# Patient Record
Sex: Male | Born: 1935 | Race: White | Hispanic: No | Smoking: Former smoker
Health system: Southern US, Community
[De-identification: ages and names within clinical notes are randomized; demographics above are authoritative.]

## PROBLEM LIST (undated history)

## (undated) DIAGNOSIS — R0600 Dyspnea, unspecified: Secondary | ICD-10-CM

## (undated) DIAGNOSIS — I10 Essential (primary) hypertension: Secondary | ICD-10-CM

## (undated) DIAGNOSIS — I4891 Unspecified atrial fibrillation: Secondary | ICD-10-CM

## (undated) DIAGNOSIS — G7000 Myasthenia gravis without (acute) exacerbation: Secondary | ICD-10-CM

## (undated) DIAGNOSIS — M1990 Unspecified osteoarthritis, unspecified site: Secondary | ICD-10-CM

## (undated) DIAGNOSIS — Z9889 Other specified postprocedural states: Secondary | ICD-10-CM

## (undated) DIAGNOSIS — J189 Pneumonia, unspecified organism: Secondary | ICD-10-CM

## (undated) HISTORY — DX: Unspecified atrial fibrillation: I48.91

## (undated) HISTORY — PX: APPENDECTOMY: SHX54

## (undated) HISTORY — PX: CHOLECYSTECTOMY: SHX55

## (undated) HISTORY — PX: NOSE SURGERY: SHX723

## (undated) HISTORY — DX: Essential (primary) hypertension: I10

## (undated) HISTORY — PX: MITRAL VALVE REPAIR: SHX2039

## (undated) HISTORY — PX: BACK SURGERY: SHX140

## (undated) HISTORY — PX: FINGER SURGERY: SHX640

## (undated) HISTORY — DX: Other specified postprocedural states: Z98.890

## (undated) HISTORY — DX: Myasthenia gravis without (acute) exacerbation: G70.00

---

## 2003-07-09 DIAGNOSIS — Z9889 Other specified postprocedural states: Secondary | ICD-10-CM

## 2003-07-09 HISTORY — DX: Other specified postprocedural states: Z98.890

## 2009-03-09 DIAGNOSIS — E7800 Pure hypercholesterolemia, unspecified: Secondary | ICD-10-CM | POA: Diagnosis present

## 2009-03-09 DIAGNOSIS — I10 Essential (primary) hypertension: Secondary | ICD-10-CM | POA: Diagnosis present

## 2012-03-28 DIAGNOSIS — I4819 Other persistent atrial fibrillation: Secondary | ICD-10-CM | POA: Diagnosis present

## 2012-03-28 DIAGNOSIS — I4811 Longstanding persistent atrial fibrillation: Secondary | ICD-10-CM | POA: Diagnosis present

## 2018-11-23 DIAGNOSIS — E663 Overweight: Secondary | ICD-10-CM | POA: Insufficient documentation

## 2021-07-05 ENCOUNTER — Other Ambulatory Visit: Payer: Self-pay

## 2021-07-05 ENCOUNTER — Emergency Department (HOSPITAL_COMMUNITY): Payer: Medicare HMO

## 2021-07-05 ENCOUNTER — Emergency Department (HOSPITAL_COMMUNITY)
Admission: EM | Admit: 2021-07-05 | Discharge: 2021-07-05 | Disposition: A | Discharge status: Home / Self Care | Payer: Medicare HMO | Attending: Emergency Medicine | Admitting: Emergency Medicine

## 2021-07-05 DIAGNOSIS — S92351A Displaced fracture of fifth metatarsal bone, right foot, initial encounter for closed fracture: Secondary | ICD-10-CM | POA: Diagnosis not present

## 2021-07-05 DIAGNOSIS — S62632A Displaced fracture of distal phalanx of right middle finger, initial encounter for closed fracture: Secondary | ICD-10-CM | POA: Insufficient documentation

## 2021-07-05 DIAGNOSIS — Z20822 Contact with and (suspected) exposure to covid-19: Secondary | ICD-10-CM | POA: Insufficient documentation

## 2021-07-05 DIAGNOSIS — N200 Calculus of kidney: Secondary | ICD-10-CM | POA: Diagnosis not present

## 2021-07-05 DIAGNOSIS — M79671 Pain in right foot: Secondary | ICD-10-CM | POA: Insufficient documentation

## 2021-07-05 DIAGNOSIS — S99921A Unspecified injury of right foot, initial encounter: Secondary | ICD-10-CM | POA: Diagnosis present

## 2021-07-05 DIAGNOSIS — S51011A Laceration without foreign body of right elbow, initial encounter: Secondary | ICD-10-CM | POA: Diagnosis not present

## 2021-07-05 DIAGNOSIS — S0990XA Unspecified injury of head, initial encounter: Secondary | ICD-10-CM | POA: Diagnosis not present

## 2021-07-05 DIAGNOSIS — S0093XA Contusion of unspecified part of head, initial encounter: Secondary | ICD-10-CM | POA: Diagnosis not present

## 2021-07-05 DIAGNOSIS — W108XXA Fall (on) (from) other stairs and steps, initial encounter: Secondary | ICD-10-CM | POA: Insufficient documentation

## 2021-07-05 DIAGNOSIS — T1490XA Injury, unspecified, initial encounter: Secondary | ICD-10-CM

## 2021-07-05 DIAGNOSIS — S2231XA Fracture of one rib, right side, initial encounter for closed fracture: Secondary | ICD-10-CM | POA: Insufficient documentation

## 2021-07-05 DIAGNOSIS — Z7901 Long term (current) use of anticoagulants: Secondary | ICD-10-CM | POA: Insufficient documentation

## 2021-07-05 DIAGNOSIS — S51019A Laceration without foreign body of unspecified elbow, initial encounter: Secondary | ICD-10-CM

## 2021-07-05 DIAGNOSIS — N2889 Other specified disorders of kidney and ureter: Secondary | ICD-10-CM

## 2021-07-05 LAB — COMPREHENSIVE METABOLIC PANEL
ALT: 49 U/L — ABNORMAL HIGH (ref 0–44)
AST: 49 U/L — ABNORMAL HIGH (ref 15–41)
Albumin: 3.5 g/dL (ref 3.5–5.0)
Alkaline Phosphatase: 45 U/L (ref 38–126)
Anion gap: 9 (ref 5–15)
BUN: 22 mg/dL (ref 8–23)
CO2: 26 mmol/L (ref 22–32)
Calcium: 10.1 mg/dL (ref 8.9–10.3)
Chloride: 103 mmol/L (ref 98–111)
Creatinine, Ser: 1.15 mg/dL (ref 0.61–1.24)
GFR, Estimated: >60 mL/min (ref >60)
Glucose, Bld: 152 mg/dL — ABNORMAL HIGH (ref 70–99)
Potassium: 3.9 mmol/L (ref 3.5–5.1)
Sodium: 138 mmol/L (ref 135–145)
Total Bilirubin: 0.7 mg/dL (ref 0.3–1.2)
Total Protein: 6.4 g/dL — ABNORMAL LOW (ref 6.5–8.1)

## 2021-07-05 LAB — RESP PANEL BY RT-PCR (FLU A&B, COVID) ARPGX2
Influenza A by PCR: NEGATIVE
Influenza B by PCR: NEGATIVE
SARS Coronavirus 2 by RT PCR: NEGATIVE

## 2021-07-05 LAB — CBC
HCT: 37.4 % — ABNORMAL LOW (ref 39.0–52.0)
Hemoglobin: 12.2 g/dL — ABNORMAL LOW (ref 13.0–17.0)
MCH: 32.6 pg (ref 26.0–34.0)
MCHC: 32.6 g/dL (ref 30.0–36.0)
MCV: 100 fL (ref 80.0–100.0)
Platelets: 153 10*3/uL (ref 150–400)
RBC: 3.74 MIL/uL — ABNORMAL LOW (ref 4.22–5.81)
RDW: 12.9 % (ref 11.5–15.5)
WBC: 8.1 10*3/uL (ref 4.0–10.5)
nRBC: 0 % (ref 0.0–0.2)

## 2021-07-05 LAB — I-STAT CHEM 8, ED
BUN: 25 mg/dL — ABNORMAL HIGH (ref 8–23)
Calcium, Ion: 1.28 mmol/L (ref 1.15–1.40)
Chloride: 100 mmol/L (ref 98–111)
Creatinine, Ser: 1 mg/dL (ref 0.61–1.24)
Glucose, Bld: 145 mg/dL — ABNORMAL HIGH (ref 70–99)
HCT: 37 % — ABNORMAL LOW (ref 39.0–52.0)
Hemoglobin: 12.6 g/dL — ABNORMAL LOW (ref 13.0–17.0)
Potassium: 3.8 mmol/L (ref 3.5–5.1)
Sodium: 138 mmol/L (ref 135–145)
TCO2: 28 mmol/L (ref 22–32)

## 2021-07-05 LAB — URINALYSIS, ROUTINE W REFLEX MICROSCOPIC
Bacteria, UA: NONE SEEN
Bilirubin Urine: NEGATIVE
Glucose, UA: NEGATIVE mg/dL
Ketones, ur: NEGATIVE mg/dL
Leukocytes,Ua: NEGATIVE
Nitrite: NEGATIVE
Protein, ur: 30 mg/dL — AB
Specific Gravity, Urine: 1.02 (ref 1.005–1.030)
pH: 5 (ref 5.0–8.0)

## 2021-07-05 LAB — LACTIC ACID, PLASMA: Lactic Acid, Venous: 2.5 mmol/L (ref 0.5–1.9)

## 2021-07-05 LAB — PROTIME-INR
INR: 2 — ABNORMAL HIGH (ref 0.8–1.2)
Prothrombin Time: 22.2 seconds — ABNORMAL HIGH (ref 11.4–15.2)

## 2021-07-05 LAB — SAMPLE TO BLOOD BANK

## 2021-07-05 MED ORDER — HYDROCODONE-ACETAMINOPHEN 5-325 MG PO TABS
1.0000 | ORAL_TABLET | Freq: Once | ORAL | Status: AC
  Administered 2021-07-05: 20:00:00 1 via ORAL
  Filled 2021-07-05: qty 1

## 2021-07-05 MED ORDER — IOHEXOL 300 MG/ML  SOLN
100.0000 mL | Freq: Once | INTRAMUSCULAR | Status: AC | PRN
  Administered 2021-07-05: 16:00:00 100 mL via INTRAVENOUS

## 2021-07-05 MED ORDER — HYDROCODONE-ACETAMINOPHEN 5-325 MG PO TABS: 1.0000 | ORAL_TABLET | ORAL | 0 refills | Status: DC | PRN

## 2021-07-05 NOTE — Discharge Instructions (Addendum)
Schedule follow-up with orthopedics to follow the fractures in the foot.  Schedule follow-up with your primary care doctor to reevaluate the mass on your kidney.

## 2021-07-05 NOTE — Progress Notes (Signed)
This chaplain responded to Level 2  fall on thinners in Trauma B.  The Pt. at the time of the visit is talking to the medical team. The Pt. son arrives at the bedside and shares with the chaplain no spiritual care needs at this time.  This chaplain is available for F/U spiritual care as needed.  Chaplain Sallyanne Kuster

## 2021-07-05 NOTE — ED Triage Notes (Signed)
Pt from home with son-son reports he fell down the stairs, hitting his head and has large skin tear to RUE. Pt on Warfarin, last dose last night. Does not remember incident or events prior to falling. C/o R ankle, R middle finger, and RUE pain. Able to answer all orientation questions appropriately.

## 2021-07-05 NOTE — ED Provider Notes (Signed)
[facility] EMERGENCY DEPARTMENT Provider Note   CSN: [id number] Arrival date & time: 07/05/21  1446     History Chief Complaint  Patient presents with   Daniel Sharp is a 87 y.o. male.  Patient brought to the emergency department after a fall.  Patient fell down steps at his son's home.  He did hit his head and has skin tears on the right upper extremity.  Patient does take Coumadin.  Son reports that he did not witness the fall but he heard the patient fall.  He immediately responded and found the patient at the bottom of the stairs, awake but confused.  Patient's confusion has cleared and he is back to his baseline now.  Patient did try to get up on his own but he could not because of right ankle pain.  Patient also complaining of right middle finger pain.      No past medical history on file.  There are no problems to display for this patient.        No family history on file.     Home Medications Prior to Admission medications   Medication Sig Start Date End Date Taking? Authorizing Provider  atenolol (TENORMIN) 25 MG tablet Take 25 mg by mouth 2 (two) times daily.   Yes [provider]  benazepril (LOTENSIN) 40 MG tablet Take 40 mg by mouth daily.   Yes [provider]  hydrochlorothiazide (HYDRODIURIL) 25 MG tablet Take 25 mg by mouth daily.   Yes [provider]  HYDROcodone-acetaminophen (NORCO/VICODIN) 5-325 MG tablet Take 1-2 tablets by mouth every 4 (four) hours as needed for moderate pain. 07/05/21  Yes Jeannett Dekoning, Gwenyth Allegra, MD  predniSONE (DELTASONE) 5 MG tablet Take 5 mg by mouth in the morning, at noon, and at bedtime. Continuous (first filled back in 02/2021 with 8 refills)   Yes [provider]  warfarin (COUMADIN) 5 MG tablet Take 2.5-5 mg by mouth daily. 2.5mg -5mg  daily, or as otherwise directed by anticoag visits (per [facility])   Yes [provider]    Allergies     Patient has no known allergies.  Review of Systems   Review of Systems  Musculoskeletal:  Positive for arthralgias.  Skin:  Positive for wound.  All other systems reviewed and are negative.  Physical Exam Updated Vital Signs BP (!) 168/122 (BP Location: Right Arm)    Pulse 100    Temp 99.6 F (37.6 C) (Oral)    Resp 18    Ht 6' (1.829 m)    Wt 102.1 kg    SpO2 92%    BMI 30.52 kg/m   Physical Exam Vitals and nursing note reviewed.  Constitutional:      General: He is not in acute distress.    Appearance: Normal appearance. He is well-developed.  HENT:     Head: Normocephalic. Contusion present.     Right Ear: Hearing normal.     Left Ear: Hearing normal.     Nose: Nose normal.  Eyes:     Conjunctiva/sclera: Conjunctivae normal.     Pupils: Pupils are equal, round, and reactive to light.  Cardiovascular:     Rate and Rhythm: Regular rhythm.     Heart sounds: S1 normal and S2 normal. No murmur heard.   No friction rub. No gallop.  Pulmonary:     Effort: Pulmonary effort is normal. No respiratory distress.     Breath sounds: Normal breath sounds.  Chest:  Chest wall: No tenderness.  Abdominal:     General: Bowel sounds are normal.     Palpations: Abdomen is soft.     Tenderness: There is no abdominal tenderness. There is no guarding or rebound. Negative signs include Murphy's sign and McBurney's sign.     Hernia: No hernia is present.  Musculoskeletal:        General: Normal range of motion.     Cervical back: Normal range of motion and neck supple.  Skin:    General: Skin is warm and dry.     Findings: No rash.     Comments: Skin tear right elbow  Neurological:     Mental Status: He is alert and oriented to person, place, and time.     GCS: GCS eye subscore is 4. GCS verbal subscore is 5. GCS motor subscore is 6.     Cranial Nerves: No cranial nerve deficit.     Sensory: No sensory deficit.     Coordination: Coordination normal.  Psychiatric:        Speech:  Speech normal.        Behavior: Behavior normal.        Thought Content: Thought content normal.    ED Results / Procedures / Treatments   Labs (all labs ordered are listed, but only abnormal results are displayed) Labs Reviewed  COMPREHENSIVE METABOLIC PANEL - Abnormal; Notable for the following components:      Result Value   Glucose, Bld 152 (*)    Total Protein 6.4 (*)    AST 49 (*)    ALT 49 (*)    All other components within normal limits  CBC - Abnormal; Notable for the following components:   RBC 3.74 (*)    Hemoglobin 12.2 (*)    HCT 37.4 (*)    All other components within normal limits  URINALYSIS, ROUTINE W REFLEX MICROSCOPIC - Abnormal; Notable for the following components:   Hgb urine dipstick SMALL (*)    Protein, ur 30 (*)    All other components within normal limits  LACTIC ACID, PLASMA - Abnormal; Notable for the following components:   Lactic Acid, Venous 2.5 (*)    All other components within normal limits  PROTIME-INR - Abnormal; Notable for the following components:   Prothrombin Time 22.2 (*)    INR 2.0 (*)    All other components within normal limits  I-STAT CHEM 8, ED - Abnormal; Notable for the following components:   BUN 25 (*)    Glucose, Bld 145 (*)    Hemoglobin 12.6 (*)    HCT 37.0 (*)    All other components within normal limits  RESP PANEL BY RT-PCR (FLU A&B, COVID) ARPGX2  ETHANOL  SAMPLE TO BLOOD BANK    EKG None  Radiology DG Ankle Complete Right  Result Date: 07/05/2021 CLINICAL DATA:  Fall.  Ankle pain. EXAM: RIGHT ANKLE - COMPLETE 3+ VIEW COMPARISON:  None. FINDINGS: Extensive soft tissue swelling is present over the lateral malleolus. The fibula is intact. Ankle mortise is intact. Oblique comminuted fracture is present in the fifth metatarsal. No additional fractures are present in the foot. IMPRESSION: 1. Oblique comminuted fracture of the fifth metatarsal. Recommend foot radiographs for further evaluation. 2. Extensive soft  tissue swelling over the lateral malleolus. No underlying fibula fracture. Electronically Signed   By: San Morelle M.D.   On: 07/05/2021 15:34   CT Head Wo Contrast  Result Date: 07/05/2021 CLINICAL DATA:  Trauma EXAM:  CT HEAD WITHOUT CONTRAST TECHNIQUE: Contiguous axial images were obtained from the base of the skull through the vertex without intravenous contrast. COMPARISON:  None. FINDINGS: Brain: There are no signs of bleeding within the cranium. Cortical sulci are prominent. There is decreased density in the periventricular and subcortical white matter. Vascular: Unremarkable. Skull: Unremarkable. Sinuses/Orbits: Unremarkable. Other: None IMPRESSION: No acute intracranial findings are seen in the noncontrast CT brain. Atrophy. Small-vessel disease. Electronically Signed   By: Elmer Picker M.D.   On: 07/05/2021 16:07   CT Cervical Spine Wo Contrast  Result Date: 07/05/2021 CLINICAL DATA:  Fall down stairs. Trauma to head. Patient on warfarin. EXAM: CT CERVICAL SPINE WITHOUT CONTRAST TECHNIQUE: Multidetector CT imaging of the cervical spine was performed without intravenous contrast. Multiplanar CT image reconstructions were also generated. COMPARISON:  None. FINDINGS: Alignment: Slight degenerative anterolisthesis present at C3-4 and C4-5. Straightening and reversal of the normal cervical lordosis is present. Skull base and vertebrae: The craniocervical junction is within normal limits. Vertebral body heights are within normal limits. No acute fracture is present. Soft tissues and spinal canal: No prevertebral fluid or swelling. No visible canal hematoma. Disc levels: Facet and uncovertebral spurring contribute 2 foraminal narrowing bilaterally at C3-4, C5-6 and C6-7 in particular. Focal density is noted posteriorly on the right at C4-5. This likely represents a synovial cyst contributing to central canal stenosis. Upper chest: The lung apices are clear. The thoracic inlet is within  normal limits. IMPRESSION: 1. No acute fracture or traumatic subluxation. 2. Multilevel degenerative changes of the cervical spine as described. 3. Focal density posteriorly on the right at C4-5 likely represents a synovial cyst contributing to central canal stenosis. Electronically Signed   By: San Morelle M.D.   On: 07/05/2021 16:06   CT CHEST ABDOMEN PELVIS W CONTRAST  Result Date: 07/05/2021 CLINICAL DATA:  Patient fell down stairs. EXAM: CT CHEST, ABDOMEN, AND PELVIS WITH CONTRAST TECHNIQUE: Multidetector CT imaging of the chest, abdomen and pelvis was performed following the standard protocol during bolus administration of intravenous contrast. CONTRAST:  119mL OMNIPAQUE IOHEXOL 300 MG/ML  SOLN COMPARISON:  None. FINDINGS: CT CHEST FINDINGS Cardiovascular: Mild cardiac enlargement. Previous median sternotomy and CABG procedure. Aortic atherosclerosis. No pericardial effusion. Mediastinum/Nodes: Thyroid gland appears normal. Trachea is patent and midline. Normal appearance of the esophagus. No enlarged axillary or supraclavicular lymph nodes. Prominent mediastinal lymph nodes identified without adenopathy. Lungs/Pleura: Trace pleural fluid identified bilaterally. Patchy ground-glass attenuation is identified scattered throughout both lungs. Mild interlobular septal thickening is identified within the lung bases. No signs of pulmonary contusion or pneumothorax. Musculoskeletal: The thoracic vertebral bodies are all intact. No signs of compression fracture. Nondisplaced anterolateral right fifth rib fracture is noted, image 89/5. Scattered old healed rib fractures are also noted bilaterally. CT ABDOMEN PELVIS FINDINGS Hepatobiliary: No hepatic injury or perihepatic hematoma. Status post cholecystectomy. No bile duct dilatation. Pancreas: Unremarkable. No pancreatic ductal dilatation or surrounding inflammatory changes. Spleen: No splenic injury or perisplenic hematoma. Adrenals/Urinary Tract: Normal  adrenal glands. Arising off the posterior cortex of the inferior pole of the left kidney is a solid exophytic mass measuring 3.9 x 3.4 cm, image 22/8 and image 85/3. Several small, less than 1 cm low-density lesions are also noted within the left renal cortex which are technically too small to characterize but likely represent small cysts. No kidney stones or hydronephrosis identified bilaterally. Urinary bladder appears normal. Stomach/Bowel: Small hiatal hernia. Stomach is otherwise unremarkable. The appendix is not confidently identified. No bowel wall  thickening, inflammation, or distension. Sigmoid diverticulosis without signs of acute diverticulitis. Vascular/Lymphatic: Aortic atherosclerosis. No aneurysm. No abdominopelvic adenopathy Reproductive: Prostate gland is enlarged measuring 7.7 by 6.3 by 7.7 cm (volume = 200 cm^3) Other: No free fluid or fluid collections. Musculoskeletal: No fracture is seen. Lumbar spondylosis noted. There is a anterolisthesis of L4 on L5 measuring 6 mm. IMPRESSION: 1. Nondisplaced anterolateral right fifth rib fracture. 2. No additional acute findings identified within the chest, abdomen or pelvis. 3. Solid exophytic mass arising off the posterior cortex of the inferior pole of the left kidney is identified concerning for renal cell carcinoma. No signs of metastatic disease identified. Referral to Urology for further management advised. 4. Trace bilateral pleural effusions, interlobular septal thickening and patchy areas of ground-glass attenuation identified. Correlate for any clinical signs or symptoms of pulmonary edema/CHF. 5. Marked prostatomegaly. 6. Aortic Atherosclerosis (ICD10-I70.0). Electronically Signed   By: Kerby Moors M.D.   On: 07/05/2021 16:17   DG Chest Port 1 View  Result Date: 07/05/2021 CLINICAL DATA:  87 year old male with a history of trauma EXAM: PORTABLE CHEST 1 VIEW COMPARISON:  None. FINDINGS: Cardiomediastinal silhouette enlarged with surgical  changes of median sternotomy and prior valve repair. Central venous congestion with prominence of the central vasculature. No interlobular septal thickening. No pneumothorax.  No pleural effusion. No confluent airspace disease with coarsened interstitial markings. No displaced fracture identified. IMPRESSION: No radiographic evidence of acute cardiopulmonary disease, given the history of trauma. Changes of cardiomegaly, median sternotomy and valve repair, and prominence of the central vasculature, potentially representing pulmonary hypertension and/or chronic changes of prior episodes of CHF Electronically Signed   By: Corrie Mckusick D.O.   On: 07/05/2021 15:27   DG Finger Middle Right  Result Date: 07/05/2021 CLINICAL DATA:  Fall.  Finger injury.  Pain. EXAM: RIGHT MIDDLE FINGER 2+V COMPARISON:  None. FINDINGS: The D IP joint is dislocated posteriorly. No definite fracture present. Soft tissue swelling is associated. IMPRESSION: Posterior dislocation of the DIP joint without definite fracture. Electronically Signed   By: San Morelle M.D.   On: 07/05/2021 15:30   DG Foot Complete Right  Result Date: 07/05/2021 CLINICAL DATA:  Pain in the right foot EXAM: RIGHT FOOT COMPLETE - 3+ VIEW COMPARISON:  07/05/2021 FINDINGS: Acute minimally displaced fracture involving the mid to distal shaft of fifth metatarsal. Chronic appearing fracture deformity of the third metatarsal. Acute to subacute appearing intra-articular fracture at the base of the first proximal phalanx. Possible subtle fracture at the lateral cuboid bone. Mild soft tissue calcification about the first MTP joint and metatarsal head, question crystal disease. IMPRESSION: 1. Acute minimally displaced mid to distal fifth metatarsal fracture. Acute to subacute intra-articular fracture at the base of the first proximal phalanx 2. Possible subtle nondisplaced fracture at the lateral cuboid. 3. Old appearing fracture deformity second metatarsal.  Electronically Signed   By: Donavan Foil M.D.   On: 07/05/2021 18:05    Procedures Procedures   Medications Ordered in ED Medications  iohexol (OMNIPAQUE) 300 MG/ML solution 100 mL (100 mLs Intravenous Contrast Given 07/05/21 1553)    ED Course  I have reviewed the triage vital signs and the nursing notes.  Pertinent labs & imaging results that were available during my care of the patient were reviewed by me and considered in my medical decision making (see chart for details).    MDM Rules/Calculators/A&P  Patient presents to the emergency department for evaluation after a fall down stairs.  Patient complaining primarily of right foot and ankle pain.  He also has right middle finger injury.  Finger x-ray shows dislocation of the distal phalanx.  This was reduced at bedside.  Patient with diffuse bruising, trauma scans performed.  CT head, cervical spine, chest abdomen and pelvis revealed only 1/5 rib fracture that is nondisplaced.  No pulmonary contusion or pneumothorax.  No intra-abdominal injury.  Patient with a skin tear, no repair possible.  X-ray of ankle was unremarkable but it did reveal what appeared to be a metatarsal fracture.  Dedicated foot x-ray confirmed metatarsal fracture, possible cuboid fracture.  Patient is a significant fall risk.  He already is using a walker to get around.  He will not tolerate nonweightbearing status with leg splint, will place in a cam walker and have follow-up with orthopedics.     Final Clinical Impression(s) / ED Diagnoses Final diagnoses:  Trauma  Closed displaced fracture of fifth metatarsal bone of right foot, initial encounter  Skin tear of elbow without complication, initial encounter  Closed fracture of one rib of right side, initial encounter  Kidney mass    Rx / DC Orders ED Discharge Orders          Ordered    HYDROcodone-acetaminophen (NORCO/VICODIN) 5-325 MG tablet  Every 4 hours PRN        07/05/21  1934             Orpah Greek, MD 07/05/21 1936

## 2021-07-11 ENCOUNTER — Ambulatory Visit (INDEPENDENT_AMBULATORY_CARE_PROVIDER_SITE_OTHER): Payer: Medicare HMO | Admitting: Family Medicine

## 2021-07-11 ENCOUNTER — Encounter: Payer: Self-pay | Admitting: Family Medicine

## 2021-07-11 ENCOUNTER — Other Ambulatory Visit: Payer: Self-pay

## 2021-07-11 ENCOUNTER — Telehealth: Payer: Self-pay | Admitting: Family Medicine

## 2021-07-11 VITALS — BP 124/60 | HR 91 | Temp 99.1°F

## 2021-07-11 DIAGNOSIS — R918 Other nonspecific abnormal finding of lung field: Secondary | ICD-10-CM

## 2021-07-11 DIAGNOSIS — R600 Localized edema: Secondary | ICD-10-CM

## 2021-07-11 DIAGNOSIS — R052 Subacute cough: Secondary | ICD-10-CM

## 2021-07-11 DIAGNOSIS — N2889 Other specified disorders of kidney and ureter: Secondary | ICD-10-CM | POA: Diagnosis not present

## 2021-07-11 DIAGNOSIS — Z952 Presence of prosthetic heart valve: Secondary | ICD-10-CM | POA: Insufficient documentation

## 2021-07-11 MED ORDER — FUROSEMIDE 20 MG PO TABS: 20.0000 mg | ORAL_TABLET | Freq: Every day | ORAL | 0 refills | Status: DC

## 2021-07-11 NOTE — Patient Instructions (Addendum)
Welcome to [name]! It was a pleasure meeting you today.  As discussed, Please schedule a 1 month follow up visit today. Will refer you to pulmonary and urologist. Take lasix few days  PLEASE NOTE:  If you had any LAB tests please let us know if you have not heard back within a few days. You may see your results on MyChart before we have a chance to review them but we will give you a call once they are reviewed by [place]. If we ordered any REFERRALS today, please let us know if you have not heard from their office within the next week.  Let us know through MyChart if you are needing REFILLS, or have your pharmacy send [place] the request. You can also use MyChart to communicate with me or any office staff.  Please try these tips to maintain a healthy lifestyle:  Eat most of your calories during the day when you are active. Eliminate processed foods including packaged sweets (pies, cakes, cookies), reduce intake of potatoes, white bread, white pasta, and white rice. Look for whole grain options, oat flour or almond flour.  Each meal should contain half fruits/vegetables, one quarter protein, and one quarter carbs (no bigger than a computer mouse).  Cut down on sweet beverages. This includes juice, soda, and sweet tea. Also watch fruit intake, though this is a healthier sweet option, it still contains natural sugar! Limit to 3 servings daily.  Drink at least 1 glass of water with each meal and aim for at least 8 glasses per day  Exercise at least 150 minutes every week.

## 2021-07-11 NOTE — Progress Notes (Signed)
New Patient Office Visit  Subjective:  Patient ID: Daniel Sharp, male    DOB: [birth year]  Age: 88 y.o. MRN: [id number]  CC:  Chief Complaint  Patient presents with   Establish Care   ER Follow-up    HPI-  here w/son.  son is adding first floor bedroom.     Daniel Sharp presents for new pt.  Pt lives in [place], [place] alone. Came here at Thanksgiving to see son.was supposed to go home today.  Fell down steps 07/05/21 at son's home. Reached for handrail and fell down stairs(pt unsteady for years). Carpeted stairs. Then wedged near door at bottom of stairs.   Son heard him and was there within seconds. No LOC. Scraped head and L arm. Confused.  So went to [facility] ER.  -fx foot, finger, and rib.  Seeing ortho. Will be seeing PT this afternoon  On CT from ER-Mass on L kidney so needs referral  -no hematuria. Some chronic incont, not good stream-long time. Son has tried [employer], but no one getting back w/him  Also, CT lungs abnormal-pt has Had cough/URI at Thanksgiving.  Congestion better but still coughing no f/c.    1.5 yrs ago, cyst removed from L3-4 and has had difficulty ambulating since.  Mitral valve repair-on coumadin.  Needs to get PT checked since staying here for now  Past Medical History:  Diagnosis Date   A-fib [facility])    history of   Hypertension    Ocular myasthenia gravis ([place])    takes pred    Past Surgical History:  Procedure Laterality Date   APPENDECTOMY     BACK SURGERY     cyst removed L3 and L4   CHOLECYSTECTOMY     MITRAL VALVE REPAIR     on coumadin   NOSE SURGERY      History reviewed. No pertinent family history.  Social History   Socioeconomic History   Marital status: Widowed    Spouse name: Not on file   Number of children: Not on file   Years of education: Not on file   Highest education level: Not on file  Occupational History   Not on file  Tobacco Use   Smoking status: Not on file   Smokeless tobacco: Not on file  Substance  and Sexual Activity   Alcohol use: Not on file   Drug use: Not on file   Sexual activity: Not on file  Other Topics Concern   Not on file  Social History Narrative   Not on file   Social Determinants of Health   Financial Resource Strain: Not on file  Food Insecurity: Not on file  Transportation Needs: Not on file  Physical Activity: Not on file  Stress: Not on file  Social Connections: Not on file  Intimate Partner Violence: Not on file    ROS: negative/noncontributory except as in HPI Tired.  Can walk 100 yrds and tired.   Occular myesthenia-on pred tid as had rxn to Mestinon in past Edema-chronic-has leg squeezers but left in [place].  Wears compression stockings for 3 days at a time as hard to get on and off   Objective:   Today's Vitals: BP 124/60    Pulse 91    Temp 99.1 F (37.3 C) (Oral)    SpO2 96%   Gen: WDWN NAD elderly wm in w/c HEENT: NCAT, conjunctiva not injected, sclera nonicteric NECK:  supple, no thyromegaly, no nodes, no carotid bruits CARDIAC: RRR, S1S2+, 1-2/6 murmur.  DP 1+B LUNGS: CTAB. No wheezes ABDOMEN:  BS+, soft, NTND, No HSM, no masses EXT:  2+edema B.  Wearing compression socks MSK: boot R foot NEURO: A&O x3.  CN II-XII intact.  PSYCH: normal mood. Good eye contact   Assessment & Plan:   Problem List Items Addressed This Visit       Other   H/O mitral valve replacement   Localized edema - Primary   Other Visit Diagnoses     Left kidney mass       Abnormal CT scan of lung       Subacute cough         1.  Left renal mass-incidental finding on CT after falling down the stairs.  Son is awaiting callback from urology for referral.  We will try to help him expedite this.  Advised patient to remain in the area until better healed and plan is in place. 2.  Abnormal CAT scan of the lungs-we will refer to pulmonary 3.  Cough-May be related to fluid overload, abnormality seen on lung CT (interstitial lung disease).  Will treat with Lasix  for a few days.  Son to call with progress.  Referring to pulmonary. 4.  Chronic edema-patient not sure if same or little worse.  He left his leg squeezers at home.  Is wearing compression stockings but 3 days in a row is hard to get on and off.  Advised to only wear 10 to 12 hours at a time.  We will do Lasix for few days and see if any improvement given that he has a cough 5.  History of mitral valve repair-on long-term Coumadin.  Per patient his goal is between 2 and 3.  Since he is having an extended stay here in [place], he is concerned about getting his pro time checked.  Advised that it was checked in the emergency room 1 week ago.  He states that he usually gets it every 2 weeks.  Will refer to the Coumadin clinic. 6.  Ocular myasthenia-controlled on prednisone 3 times daily.  Continue  Follow-up in 1 month.  Patient would like to go back to [place] by the end of February.  Coincidently, son is building a downstairs bedroom onto his house which was supposed to been done by now.  Patient will be getting physical therapy due to fracture of his foot, long-term gait instability from slipped on his spine that was removed.  Advised patient and son that they have to decide on long-term goals with patient (whether he remains in [place] all the time or not)  Spent 15 min addn'l reviewing records from ER.  Spent 31min w/pt/son  Outpatient Encounter Medications as of 07/11/2021  Medication Sig   atenolol (TENORMIN) 25 MG tablet Take 25 mg by mouth 2 (two) times daily.   benazepril (LOTENSIN) 40 MG tablet Take 40 mg by mouth daily.   furosemide (LASIX) 20 MG tablet Take 1 tablet (20 mg total) by mouth daily.   hydrochlorothiazide (HYDRODIURIL) 25 MG tablet Take 25 mg by mouth daily.   HYDROcodone-acetaminophen (NORCO/VICODIN) 5-325 MG tablet Take 1-2 tablets by mouth every 4 (four) hours as needed for moderate pain.   predniSONE (DELTASONE) 5 MG tablet Take 5 mg by mouth in the morning, at noon,  and at bedtime. Continuous (first filled back in 02/2021 with 8 refills)   warfarin (COUMADIN) 5 MG tablet Take 2.5-5 mg by mouth daily. 2.5mg -5mg  daily, or as otherwise directed by anticoag visits (per [facility])   No facility-administered encounter medications on file as of 07/11/2021.    Follow-up: Return in about 4 weeks (around 08/08/2021) for 1-2 months. edema, etc..   Wellington Hampshire, MD

## 2021-07-11 NOTE — Telephone Encounter (Signed)
Pt son called and said that patient got an appt with the urologist but its not until 08/02/21 with [facility] and they want to know if they can possibly get something sooner and wanted to know if we could help. Call back [phone]

## 2021-07-12 NOTE — Telephone Encounter (Signed)
Left message for patient's son to return my call.

## 2021-07-12 NOTE — Telephone Encounter (Signed)
Dirk notified and verbalized understanding.

## 2021-07-18 ENCOUNTER — Ambulatory Visit (INDEPENDENT_AMBULATORY_CARE_PROVIDER_SITE_OTHER): Payer: Medicare HMO

## 2021-07-18 ENCOUNTER — Other Ambulatory Visit: Payer: Self-pay

## 2021-07-18 ENCOUNTER — Ambulatory Visit: Payer: Medicare HMO

## 2021-07-18 DIAGNOSIS — Z7901 Long term (current) use of anticoagulants: Secondary | ICD-10-CM | POA: Diagnosis not present

## 2021-07-18 LAB — POCT INR: INR: 1.4 — AB (ref 2.0–3.0)

## 2021-07-18 NOTE — Patient Instructions (Addendum)
Pre visit review using our clinic review tool, if applicable. No additional management support is needed unless otherwise documented below in the visit note.  Increase dose today and tomorrow to 7.5 mg and then continue 5 mg daily except take 2.5 mg on Sun, Tues, and Fri. Recheck in 1 week.

## 2021-07-18 NOTE — Progress Notes (Signed)
Increase dose today and tomorrow to 7.5 mg and then continue 5 mg daily except take 2.5 mg on Sun, Tues, and Fri. Recheck in 1 week.

## 2021-07-25 ENCOUNTER — Ambulatory Visit (INDEPENDENT_AMBULATORY_CARE_PROVIDER_SITE_OTHER): Payer: Medicare HMO

## 2021-07-25 ENCOUNTER — Other Ambulatory Visit: Payer: Self-pay

## 2021-07-25 DIAGNOSIS — Z7901 Long term (current) use of anticoagulants: Secondary | ICD-10-CM | POA: Diagnosis not present

## 2021-07-25 LAB — POCT INR: INR: 2.2 (ref 2.0–3.0)

## 2021-07-25 NOTE — Patient Instructions (Addendum)
Pre visit review using our clinic review tool, if applicable. No additional management support is needed unless otherwise documented below in the visit note.  Continue 5 mg daily except take 2.5 mg on Sun, Tues, and Fri. Recheck in 2 week.

## 2021-07-25 NOTE — Progress Notes (Addendum)
Continue 5 mg daily except take 2.5 mg on Sun, Tues, and Fri. Recheck in 2 week.  Pt reports eating a lot of cranberry dressing in the last week but it is now gone. This will increase INR. Unsure if pt is in range today due to warfarin dosing or eating cranberry dressing. Advised it is best to see him back in 2 wks. Pt reports he may go back home by then but his son said he would still be here. Advised if he returns home to have INR checked as soon as he gets back. Pt and pt's son verbalized understanding.

## 2021-07-31 ENCOUNTER — Other Ambulatory Visit: Payer: Self-pay | Admitting: Family Medicine

## 2021-07-31 ENCOUNTER — Encounter (HOSPITAL_COMMUNITY): Payer: Self-pay | Admitting: Radiology

## 2021-08-01 ENCOUNTER — Encounter: Payer: Self-pay | Admitting: Pulmonary Disease

## 2021-08-01 ENCOUNTER — Other Ambulatory Visit: Payer: Self-pay

## 2021-08-01 ENCOUNTER — Ambulatory Visit (INDEPENDENT_AMBULATORY_CARE_PROVIDER_SITE_OTHER): Payer: Medicare HMO | Admitting: Pulmonary Disease

## 2021-08-01 VITALS — BP 126/66 | HR 89 | Temp 97.7°F | Wt 216.0 lb

## 2021-08-01 DIAGNOSIS — R9389 Abnormal findings on diagnostic imaging of other specified body structures: Secondary | ICD-10-CM | POA: Diagnosis not present

## 2021-08-01 DIAGNOSIS — N289 Disorder of kidney and ureter, unspecified: Secondary | ICD-10-CM | POA: Diagnosis not present

## 2021-08-01 DIAGNOSIS — J810 Acute pulmonary edema: Secondary | ICD-10-CM | POA: Diagnosis not present

## 2021-08-01 DIAGNOSIS — I509 Heart failure, unspecified: Secondary | ICD-10-CM

## 2021-08-01 DIAGNOSIS — R600 Localized edema: Secondary | ICD-10-CM | POA: Diagnosis not present

## 2021-08-01 MED ORDER — FUROSEMIDE 20 MG PO TABS: 20.0000 mg | ORAL_TABLET | Freq: Every day | ORAL | 0 refills | Status: DC

## 2021-08-01 NOTE — Progress Notes (Signed)
Synopsis: Referred in January 2023 for abnormal CT chest by [name], MD  Subjective:   PATIENT ID: Daniel Sharp GENDER: male DOB: 1932/11/12, MRN: [id number]  Chief Complaint  Patient presents with   Consult    Patient is here to talk about abnormal CT scan he had.     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  Patient was found to have a CT chest abdomen and pelvis completed which revealed a solid exophytic mass off the posterior cortex of the left kidney concerning for renal cell carcinoma with no evidence of distant metastatic disease.  Patient had a right fifth rib fracture.  Additionally had trace bilateral pleural effusions within the lung interlobular septal thickening and patchy areas of groundglass.  Patient was referred to pulmonary for abnormal CT imaging of the chest.  Patient prior to hospitalization after fall was having some shortness of breath.  He is living in [place] down here visiting family when all of this occurred.  He was treated with Lasix in the past.  Recently started on them during hospitalization.  Taken 20 mg of Lasix per day.   Past Medical History:  Diagnosis Date   A-fib [facility])    history of   Hypertension    Ocular myasthenia gravis ([place])    takes pred     No family history on file.   Past Surgical History:  Procedure Laterality Date   APPENDECTOMY     BACK SURGERY     cyst removed L3 and L4   CHOLECYSTECTOMY     MITRAL VALVE REPAIR     on coumadin   NOSE SURGERY      Social History   Socioeconomic History   Marital status: Widowed    Spouse name: Not on file   Number of children: Not on file   Years of education: Not on file   Highest education level: Not on file  Occupational History    Not on file  Tobacco Use   Smoking status: Former    Types: Cigarettes, Pipe, Cigars   Smokeless tobacco: Former    Types: Nurse, children's Use: Never used  Substance and Sexual Activity   Alcohol use: Not on file   Drug use: Not on file   Sexual activity: Not on file  Other Topics Concern   Not on file  Social History Narrative   Lives in [place], [place].  Son lives in [place] Determinants of Health   Financial Resource Strain: Not on file  Food Insecurity: Not on file  Transportation Needs: Not on file  Physical Activity: Not on file  Stress: Not on file  Social Connections: Not on file  Intimate Partner Violence: Not on file     Allergies  Allergen Reactions   Mestinon [Pyridostigmine Bromide] Diarrhea   Other Nausea Only     Outpatient Medications Prior to Visit  Medication Sig Dispense Refill   atenolol (TENORMIN) 25 MG tablet Take 25 mg by mouth 2 (two) times daily.     benazepril (LOTENSIN) 40 MG tablet Take 40 mg by mouth daily.     hydrochlorothiazide (HYDRODIURIL) 25 MG tablet Take 25 mg by mouth daily.     HYDROcodone-acetaminophen (NORCO/VICODIN) 5-325 MG tablet Take 1-2 tablets by  mouth every 4 (four) hours as needed for moderate pain. 20 tablet 0   predniSONE (DELTASONE) 5 MG tablet Take 5 mg by mouth in the morning, at noon, and at bedtime. Continuous (first filled back in 02/2021 with 8 refills)     warfarin (COUMADIN) 5 MG tablet Take 2.5-5 mg by mouth daily. 2.5mg -5mg  daily, or as otherwise directed by anticoag visits (per [facility])     furosemide (LASIX) 20 MG tablet TAKE 1 TABLET BY MOUTH EVERY DAY 30 tablet 0   No facility-administered medications prior to visit.    Review of Systems  Constitutional:  Negative for chills, fever, malaise/fatigue and weight loss.  HENT:  Negative for hearing loss, sore throat and tinnitus.   Eyes:  Negative for blurred vision and double vision.  Respiratory:  Positive for shortness of  breath. Negative for cough, hemoptysis, sputum production, wheezing and stridor.   Cardiovascular:  Positive for leg swelling. Negative for chest pain, palpitations, orthopnea and PND.  Gastrointestinal:  Negative for abdominal pain, constipation, diarrhea, heartburn, nausea and vomiting.  Genitourinary:  Negative for dysuria, hematuria and urgency.  Musculoskeletal:  Negative for joint pain and myalgias.  Skin:  Negative for itching and rash.  Neurological:  Negative for dizziness, tingling, weakness and headaches.  Endo/Heme/Allergies:  Negative for environmental allergies. Does not bruise/bleed easily.  Psychiatric/Behavioral:  Negative for depression. The patient is not nervous/anxious and does not have insomnia.   All other systems reviewed and are negative.   Objective:  Physical Exam Vitals reviewed.  Constitutional:      General: He is not in acute distress.    Appearance: He is well-developed.  HENT:     Head: Normocephalic and atraumatic.  Eyes:     General: No scleral icterus.    Conjunctiva/sclera: Conjunctivae normal.     Pupils: Pupils are equal, round, and reactive to light.  Neck:     Vascular: No JVD.     Trachea: No tracheal deviation.  Cardiovascular:     Rate and Rhythm: Normal rate and regular rhythm.     Heart sounds: No murmur heard. Pulmonary:     Effort: Pulmonary effort is normal. No tachypnea, accessory muscle usage or respiratory distress.     Breath sounds: No stridor. No wheezing, rhonchi or rales.     Comments: No crackles Abdominal:     General: Bowel sounds are normal. There is no distension.     Palpations: Abdomen is soft.     Tenderness: There is no abdominal tenderness.  Musculoskeletal:        General: No tenderness.     Cervical back: Neck supple.  Lymphadenopathy:     Cervical: No cervical adenopathy.  Skin:    General: Skin is warm and dry.     Capillary Refill: Capillary refill takes less than 2 seconds.     Findings: No rash.   Neurological:     Mental Status: He is alert and oriented to person, place, and time.  Psychiatric:        Behavior: Behavior normal.     Vitals:   08/01/21 0925  BP: 126/66  Pulse: 89  Temp: 97.7 F (36.5 C)  TempSrc: Oral  SpO2: 98%  Weight: 216 lb (98 kg)   98% on RA BMI Readings from Last 3 Encounters:  08/01/21 29.29 kg/m  07/05/21 30.52 kg/m   Wt Readings from Last 3 Encounters:  08/01/21 216 lb (98 kg)  07/05/21 225 lb (102.1 kg)  CBC    Component Value Date/Time   WBC 8.1 07/05/2021 1457   RBC 3.74 (L) 07/05/2021 1457   HGB 12.6 (L) 07/05/2021 1530   HCT 37.0 (L) 07/05/2021 1530   PLT 153 07/05/2021 1457   MCV 100.0 07/05/2021 1457   MCH 32.6 07/05/2021 1457   MCHC 32.6 07/05/2021 1457   RDW 12.9 07/05/2021 1457     Chest Imaging: 07/05/2021 CT chest: Interlobular septal thickening, small amount of pleural fluid, groundglass opacities concerning for acute pulmonary edema. The patient's images have been independently reviewed by me.    Pulmonary Functions Testing Results: No flowsheet data found.  FeNO:   Pathology:   Echocardiogram:   Heart Catheterization:     Assessment & Plan:     ICD-10-CM   1. Abnormal CT of the chest  R93.89    Areas of groundglass opacities with associated interlobular septal thickening and small pleural effusions consistent with pulmonary edema    2. Renal lesion  N28.9    3 cm lesion within the left kidney, exophytic concerning for malignancy    3. Acute pulmonary edema (HCC)  J81.0 furosemide (LASIX) 20 MG tablet    4. Lower extremity edema  R60.0 furosemide (LASIX) 20 MG tablet    5. Congestive heart failure, unspecified HF chronicity, unspecified heart failure type ([place])  I50.9       Discussion:  This is an 88 year old gentleman, recent fall in the ER with abnormal CT imaging of the chest.  His CT imaging is concerning for pulmonary edema to me.  He has a small amount of interlobular septal  thickening and groundglass opacities which is peribronchovascular nature with a small amount of pleural fluid.  He has a history of heart surgery.  He also has atrial fibrillation.  During this time his blood pressure was also elevated and he has had increasing lower extremity edema prior to his hospitalization.  Plan: He should follow-up with primary care provider. I will give him a additional prescription for Lasix 20 mg daily. He needs to monitor his fluid intake and observe for any increased weight gain. From a pulmonary standpoint on think he needs any additional follow-up with [place]. His shortness of breath is also improved after starting Lasix. His lower extremity edema is also improved. Most importantly he needs to see urology for evaluation of this kidney mass.  Return to clinic as needed.   Current Outpatient Medications:    atenolol (TENORMIN) 25 MG tablet, Take 25 mg by mouth 2 (two) times daily., Disp: , Rfl:    benazepril (LOTENSIN) 40 MG tablet, Take 40 mg by mouth daily., Disp: , Rfl:    furosemide (LASIX) 20 MG tablet, Take 1 tablet (20 mg total) by mouth daily., Disp: 30 tablet, Rfl: 0   hydrochlorothiazide (HYDRODIURIL) 25 MG tablet, Take 25 mg by mouth daily., Disp: , Rfl:    HYDROcodone-acetaminophen (NORCO/VICODIN) 5-325 MG tablet, Take 1-2 tablets by mouth every 4 (four) hours as needed for moderate pain., Disp: 20 tablet, Rfl: 0   predniSONE (DELTASONE) 5 MG tablet, Take 5 mg by mouth in the morning, at noon, and at bedtime. Continuous (first filled back in 02/2021 with 8 refills), Disp: , Rfl:    warfarin (COUMADIN) 5 MG tablet, Take 2.5-5 mg by mouth daily. 2.5mg -5mg  daily, or as otherwise directed by anticoag visits (per [facility]), Disp: , Rfl:    Garner Nash, DO [facility] 08/01/2021 9:55 AM

## 2021-08-01 NOTE — Patient Instructions (Addendum)
Thank you for visiting Dr. Valeta Harms at [facility]. Today we recommend the following:  Meds ordered this encounter  Medications   furosemide (LASIX) 20 MG tablet    Sig: Take 1 tablet (20 mg total) by mouth daily.    Dispense:  30 tablet    Refill:  0   Return if symptoms worsen or fail to improve.  Follow up with PCP  Call us if needed     Please do your part to reduce the spread of COVID-19.

## 2021-08-07 ENCOUNTER — Ambulatory Visit: Payer: Medicare HMO | Admitting: Orthopedic Surgery

## 2021-08-08 ENCOUNTER — Ambulatory Visit: Payer: Medicare HMO

## 2021-08-09 ENCOUNTER — Telehealth: Payer: Self-pay

## 2021-08-09 NOTE — Telephone Encounter (Signed)
Pt cancelled apt on 2/1. LVM for pt's son, [name], to call to RS.

## 2021-08-13 ENCOUNTER — Ambulatory Visit (INDEPENDENT_AMBULATORY_CARE_PROVIDER_SITE_OTHER): Payer: Medicare HMO | Admitting: Orthopedic Surgery

## 2021-08-13 ENCOUNTER — Encounter: Payer: Self-pay | Admitting: Orthopedic Surgery

## 2021-08-13 ENCOUNTER — Other Ambulatory Visit: Payer: Self-pay

## 2021-08-13 ENCOUNTER — Ambulatory Visit: Payer: Self-pay

## 2021-08-13 DIAGNOSIS — M79671 Pain in right foot: Secondary | ICD-10-CM | POA: Diagnosis not present

## 2021-08-13 DIAGNOSIS — S92351A Displaced fracture of fifth metatarsal bone, right foot, initial encounter for closed fracture: Secondary | ICD-10-CM | POA: Diagnosis not present

## 2021-08-14 ENCOUNTER — Encounter: Payer: Self-pay | Admitting: Orthopedic Surgery

## 2021-08-14 ENCOUNTER — Ambulatory Visit: Payer: Medicare HMO | Admitting: Family Medicine

## 2021-08-14 NOTE — Progress Notes (Signed)
Office Visit Note   Patient: Daniel Sharp           Date of Birth: 1932/10/17           MRN: [id number] Visit Date: 08/13/2021              Requested by: [name], MD [address] [place],  [place] [address] PCP: [name], MD  Chief Complaint  Patient presents with   Right Foot - Pain    DOI 07/03/2021      HPI: Patient is an 88 year old gentleman who is about 5 weeks status post displaced fracture of the shaft of the fifth metatarsal right foot.  Patient is currently in a walking boot without pain.  Assessment & Plan: Visit Diagnoses:  1. Pain in right foot   2. Closed displaced fracture of fifth metatarsal bone of right foot, initial encounter     Plan: Recommended a size extra-large compression sock for the venous swelling in his leg.  Recommended continue with the fracture boot.  Follow-up in 4 weeks with repeat radiographs at which time anticipate he can be released without restrictions.  Follow-Up Instructions: Return in about 4 weeks (around 09/10/2021).   Ortho Exam  Patient is alert, oriented, no adenopathy, well-dressed, normal affect, normal respiratory effort. Examination patient does have venous stasis swelling in the right leg his calf is 42 cm in circumference he does have skin color changes consistent with chronic venous insufficiency.  There is no ecchymosis or bruising in the right foot the fracture site is nontender to palpation.  Imaging: No results found. No images are attached to the encounter.  Labs: No results found for: HGBA1C, ESRSEDRATE, CRP, LABURIC, REPTSTATUS, GRAMSTAIN, CULT, LABORGA   Lab Results  Component Value Date   ALBUMIN 3.5 07/05/2021    No results found for: MG No results found for: VD25OH  No results found for: PREALBUMIN CBC EXTENDED Latest Ref Rng & Units 07/05/2021 07/05/2021  WBC 4.0 - 10.5 K/uL - 8.1  RBC 4.22 - 5.81 MIL/uL - 3.74(L)  HGB 13.0 - 17.0 g/dL 12.6(L) 12.2(L)  HCT 39.0 - 52.0 %  37.0(L) 37.4(L)  PLT 150 - 400 K/uL - 153     There is no height or weight on file to calculate BMI.  Orders:  Orders Placed This Encounter  Procedures   XR Foot Complete Right   No orders of the defined types were placed in this encounter.    Procedures: No procedures performed  Clinical Data: No additional findings.  ROS:  All other systems negative, except as noted in the HPI. Review of Systems  Objective: Vital Signs: There were no vitals taken for this visit.  Specialty Comments:  No specialty comments available.  PMFS History: Patient Active Problem List   Diagnosis Date Noted   H/O mitral valve replacement 07/11/2021   Localized edema 07/11/2021   Past Medical History:  Diagnosis Date   A-fib [facility])    history of   Hypertension    Ocular myasthenia gravis ([place])    takes pred    History reviewed. No pertinent family history.  Past Surgical History:  Procedure Laterality Date   APPENDECTOMY     BACK SURGERY     cyst removed L3 and L4   CHOLECYSTECTOMY     MITRAL VALVE REPAIR     on coumadin   NOSE SURGERY     Social History   Occupational History   Not on file  Tobacco Use  Smoking status: Former    Types: Cigarettes, Pipe, Cigars   Smokeless tobacco: Former    Types: Nurse, children's Use: Never used  Substance and Sexual Activity   Alcohol use: Not on file   Drug use: Not on file   Sexual activity: Not on file

## 2021-08-15 ENCOUNTER — Other Ambulatory Visit: Payer: Self-pay

## 2021-08-15 ENCOUNTER — Ambulatory Visit (INDEPENDENT_AMBULATORY_CARE_PROVIDER_SITE_OTHER): Payer: Medicare HMO

## 2021-08-15 ENCOUNTER — Ambulatory Visit (INDEPENDENT_AMBULATORY_CARE_PROVIDER_SITE_OTHER): Payer: Medicare HMO | Admitting: Family Medicine

## 2021-08-15 ENCOUNTER — Encounter: Payer: Self-pay | Admitting: Family Medicine

## 2021-08-15 VITALS — BP 130/70 | HR 57 | Temp 98.1°F | Ht 73.0 in | Wt 221.1 lb

## 2021-08-15 DIAGNOSIS — Z952 Presence of prosthetic heart valve: Secondary | ICD-10-CM | POA: Diagnosis not present

## 2021-08-15 DIAGNOSIS — N2889 Other specified disorders of kidney and ureter: Secondary | ICD-10-CM | POA: Diagnosis not present

## 2021-08-15 DIAGNOSIS — R600 Localized edema: Secondary | ICD-10-CM | POA: Diagnosis not present

## 2021-08-15 DIAGNOSIS — J810 Acute pulmonary edema: Secondary | ICD-10-CM | POA: Diagnosis not present

## 2021-08-15 DIAGNOSIS — Z7901 Long term (current) use of anticoagulants: Secondary | ICD-10-CM | POA: Diagnosis not present

## 2021-08-15 LAB — CBC
HCT: 33.4 % — ABNORMAL LOW (ref 39.0–52.0)
Hemoglobin: 11.3 g/dL — ABNORMAL LOW (ref 13.0–17.0)
MCHC: 33.9 g/dL (ref 30.0–36.0)
MCV: 94.9 fl (ref 78.0–100.0)
Platelets: 190 10*3/uL (ref 150.0–400.0)
RBC: 3.52 Mil/uL — ABNORMAL LOW (ref 4.22–5.81)
RDW: 13.6 % (ref 11.5–15.5)
WBC: 8.7 10*3/uL (ref 4.0–10.5)

## 2021-08-15 LAB — POCT INR: INR: 1.9 — AB (ref 2.0–3.0)

## 2021-08-15 MED ORDER — FUROSEMIDE 20 MG PO TABS: 20.0000 mg | ORAL_TABLET | Freq: Every day | ORAL | 1 refills | Status: DC

## 2021-08-15 NOTE — Patient Instructions (Addendum)
Pre visit review using our clinic review tool, if applicable. No additional management support is needed unless otherwise documented below in the visit note.  Increase dose today to take 7.5 mg and then continue 5 mg daily except take 2.5 mg on Sun, Tues, and Fri. Recheck in 2 week. Contact coumadin clinic number, [phone], if any concerns or questions.

## 2021-08-15 NOTE — Progress Notes (Signed)
Subjective:     Patient ID: Daniel Sharp, male    DOB: Jun 26, 1933, 88 y.o.   MRN: [id number]  Chief Complaint  Patient presents with   Follow-up    4 week follow-up     HPI-here w/son.  Went to [place] last wk w/other son.  Fx R foot-seeing ortho. Having PT.  Supposed to be going for 18minute walks now.   2.  Pulm edema-saw pulm.  Pt thought 40mg  lasix too much(doc in [place] rx) so had stopped.  Using the leg squeezers now.  Using qod and lasix 20mg  daily. More energy when edema better.   No coughing.  3.  Myesthenia Gravis-some more difficulty swallowing.  Will see neuro end of month.  On prednisone 4.  RCC-saw urol-repeat 3 mo CT and will determine aggressiveness to decide plan.  Going on [employer] in [place].    Health Maintenance Due  Topic Date Due   TETANUS/TDAP  Never done   Zoster Vaccines- Shingrix (1 of 2) Never done   Pneumonia Vaccine 47+ Years old (1 - PCV) Never done    Past Medical History:  Diagnosis Date   A-fib ([place])    history of   Hypertension    Ocular myasthenia gravis ([place])    takes pred    Past Surgical History:  Procedure Laterality Date   APPENDECTOMY     BACK SURGERY     cyst removed L3 and L4   CHOLECYSTECTOMY     MITRAL VALVE REPAIR     on coumadin   NOSE SURGERY      Outpatient Medications Prior to Visit  Medication Sig Dispense Refill   atenolol (TENORMIN) 25 MG tablet Take 25 mg by mouth 2 (two) times daily.     benazepril (LOTENSIN) 40 MG tablet Take 40 mg by mouth daily.     HYDROcodone-acetaminophen (NORCO/VICODIN) 5-325 MG tablet Take 1-2 tablets by mouth every 4 (four) hours as needed for moderate pain. 20 tablet 0   predniSONE (DELTASONE) 5 MG tablet Take 5 mg by mouth in the morning, at noon, and at bedtime. Continuous (first filled back in 02/2021 with 8 refills)     warfarin (COUMADIN) 5 MG tablet Take 2.5-5 mg by mouth daily. 2.5mg -5mg  daily, or as otherwise directed by anticoag visits (per [facility])      furosemide (LASIX) 20 MG tablet Take 1 tablet (20 mg total) by mouth daily. 30 tablet 0   hydrochlorothiazide (HYDRODIURIL) 25 MG tablet Take 25 mg by mouth daily.     No facility-administered medications prior to visit.    Allergies  Allergen Reactions   Mestinon [Pyridostigmine Bromide] Diarrhea   Other Nausea Only   KYH:CWCBJSEG/[id number] except as noted in HPI      Objective:     BP 130/70    Pulse (!) 57    Temp 98.1 F (36.7 C) (Temporal)    Ht 6\' 1"  (1.854 m)    Wt 221 lb 2 oz (100.3 kg) Comment: with a boot on right foot   SpO2 96%    BMI 29.17 kg/m  Wt Readings from Last 3 Encounters:  08/15/21 221 lb 2 oz (100.3 kg)  08/01/21 216 lb (98 kg)  07/05/21 225 lb (102.1 kg)       Gen: WDWN NAD elderly wm w/cane.  Looks better. HEENT: NCAT, conjunctiva not injected, sclera nonicteric NECK:  supple, no thyromegaly, no nodes, no carotid bruits CARDIAC: RRR, S1S2+, 1/6 murmur. DP 1+B LUNGS: CTAB. No wheezes  ABDOMEN:  BS+, soft, NTND, No HSM, no masses EXT:  2+edema B. But better. Wearing compression socks. Vericose veins on L.  R has boot on. MSK: boot R foot NEURO: A&O x3.  CN II-XII intact.  PSYCH: normal mood. Good eye contact.  Assessment & Plan:   Problem List Items Addressed This Visit       Other   H/O mitral valve replacement - Primary   Localized edema   Relevant Orders   Comprehensive metabolic panel   CBC   Other Visit Diagnoses     Left kidney mass       Acute pulmonary edema (HCC)       Relevant Medications   furosemide (LASIX) 20 MG tablet   Lower extremity edema       Relevant Medications   furosemide (LASIX) 20 MG tablet      Edema-some CHF.  Pt had stopped 40mg   lasix long ago.  Now on 20mg  daily and better.  D/c hctz.  Check labs cmp.  Cont leg squeezers-do daily-not qod.  Compression stockings.  Pulm edema-better since on lasix-discussed reasons to pt and son L renal mass-prob cancer-being followed by urol-reck end April H/o  mitral valve replacement-on coumadin and followed in [facility]. R foot fx-being followed by ortho.   Meds ordered this encounter  Medications   furosemide (LASIX) 20 MG tablet    Sig: Take 1 tablet (20 mg total) by mouth daily.    Dispense:  90 tablet    Refill:  1    Wellington Hampshire, MD

## 2021-08-15 NOTE — Progress Notes (Addendum)
Increase dose today to take 7.5 mg and then continue 5 mg daily except take 2.5 mg on Sun, Tues, and Fri. Recheck in 2 week due to pt being out of town in 3 and 4 weeks. Contact coumadin clinic number, [phone], if any concerns or questions.

## 2021-08-15 NOTE — Patient Instructions (Signed)
It was very nice to see you today!  Stop hydrochlorothiazide Walk 10 minutes/day.   PLEASE NOTE:  If you had any lab tests please let us know if you have not heard back within a few days. You may see your results on MyChart before we have a chance to review them but we will give you a call once they are reviewed by [place]. If we ordered any referrals today, please let us know if you have not heard from their office within the next week.   Please try these tips to maintain a healthy lifestyle:  Eat most of your calories during the day when you are active. Eliminate processed foods including packaged sweets (pies, cakes, cookies), reduce intake of potatoes, white bread, white pasta, and white rice. Look for whole grain options, oat flour or almond flour.  Each meal should contain half fruits/vegetables, one quarter protein, and one quarter carbs (no bigger than a computer mouse).  Cut down on sweet beverages. This includes juice, soda, and sweet tea. Also watch fruit intake, though this is a healthier sweet option, it still contains natural sugar! Limit to 3 servings daily.  Drink at least 1 glass of water with each meal and aim for at least 8 glasses per day  Exercise at least 150 minutes every week.

## 2021-08-16 LAB — COMPREHENSIVE METABOLIC PANEL
ALT: 16 U/L (ref 0–53)
AST: 18 U/L (ref 0–37)
Albumin: 3.7 g/dL (ref 3.5–5.2)
Alkaline Phosphatase: 59 U/L (ref 39–117)
BUN: 31 mg/dL — ABNORMAL HIGH (ref 6–23)
CO2: 34 mEq/L — ABNORMAL HIGH (ref 19–32)
Calcium: 10.3 mg/dL (ref 8.4–10.5)
Chloride: 103 mEq/L (ref 96–112)
Creatinine, Ser: 1.11 mg/dL (ref 0.40–1.50)
GFR: 60.13 mL/min (ref >60.00)
Glucose, Bld: 96 mg/dL (ref 70–99)
Potassium: 3.8 mEq/L (ref 3.5–5.1)
Sodium: 142 mEq/L (ref 135–145)
Total Bilirubin: 0.7 mg/dL (ref 0.2–1.2)
Total Protein: 6.4 g/dL (ref 6.0–8.3)

## 2021-08-28 ENCOUNTER — Ambulatory Visit (INDEPENDENT_AMBULATORY_CARE_PROVIDER_SITE_OTHER): Payer: Medicare HMO

## 2021-08-28 ENCOUNTER — Encounter: Payer: Self-pay | Admitting: Orthopedic Surgery

## 2021-08-28 ENCOUNTER — Other Ambulatory Visit: Payer: Self-pay

## 2021-08-28 ENCOUNTER — Ambulatory Visit (INDEPENDENT_AMBULATORY_CARE_PROVIDER_SITE_OTHER): Payer: Medicare HMO | Admitting: Orthopedic Surgery

## 2021-08-28 DIAGNOSIS — S92351A Displaced fracture of fifth metatarsal bone, right foot, initial encounter for closed fracture: Secondary | ICD-10-CM | POA: Diagnosis not present

## 2021-08-28 NOTE — Progress Notes (Signed)
Office Visit Note   Patient: Daniel Sharp           Date of Birth: March 12, 1933           MRN: [id number] Visit Date: 08/28/2021              Requested by: [name], MD [address] [place],  [place] [address] PCP: [name], MD  Chief Complaint  Patient presents with   Right Foot - Follow-up    S/p displaced fx shaft of 5th MT      HPI: Patient is an 88 year old gentleman who presents status post spiral fracture of the shaft of the fifth metatarsal right foot.  Patient is currently in a knee-high compression socks and states that these are the best socks he is ever worn.  He has no pain with using the fracture boot.  Assessment & Plan: Visit Diagnoses:  1. Closed displaced fracture of fifth metatarsal bone of right foot, initial encounter     Plan: Patient will advance to his stiff soled sneakers advance activities as tolerated.  Follow-Up Instructions: No follow-ups on file.   Ortho Exam  Patient is alert, oriented, no adenopathy, well-dressed, normal affect, normal respiratory effort. Examination the fracture site is nontender with weightbearing nontender to palpation nontender with attempted distraction through the metatarsal head.  Imaging: XR Foot Complete Right  Result Date: 08/28/2021 Three-view radiographs of the right foot shows stable alignment of the spiral fracture of the fifth metatarsal.  There is old healing callus around the second metatarsal and collapse of the MTP joint of the great toe.  No images are attached to the encounter.  Labs: No results found for: HGBA1C, ESRSEDRATE, CRP, LABURIC, REPTSTATUS, GRAMSTAIN, CULT, LABORGA   Lab Results  Component Value Date   ALBUMIN 3.7 08/15/2021   ALBUMIN 3.5 07/05/2021    No results found for: MG No results found for: VD25OH  No results found for: PREALBUMIN CBC EXTENDED Latest Ref Rng & Units 08/15/2021 07/05/2021 07/05/2021  WBC 4.0 - 10.5 K/uL 8.7 - 8.1  RBC 4.22 - 5.81 Mil/uL  3.52(L) - 3.74(L)  HGB 13.0 - 17.0 g/dL 11.3(L) 12.6(L) 12.2(L)  HCT 39.0 - 52.0 % 33.4(L) 37.0(L) 37.4(L)  PLT 150.0 - 400.0 K/uL 190.0 - 153     There is no height or weight on file to calculate BMI.  Orders:  Orders Placed This Encounter  Procedures   XR Foot Complete Right   No orders of the defined types were placed in this encounter.    Procedures: No procedures performed  Clinical Data: No additional findings.  ROS:  All other systems negative, except as noted in the HPI. Review of Systems  Objective: Vital Signs: There were no vitals taken for this visit.  Specialty Comments:  No specialty comments available.  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 No pertinent family history.  Past Surgical History:  Procedure Laterality Date   APPENDECTOMY     BACK SURGERY     cyst removed L3 and L4   CHOLECYSTECTOMY     MITRAL VALVE REPAIR     on coumadin   NOSE SURGERY     Social  History   Occupational History   Not on file  Tobacco Use   Smoking status: Former    Types: Cigarettes, Pipe, Cigars   Smokeless tobacco: Former    Types: Nurse, children's Use: Never used  Substance and Sexual Activity   Alcohol use: Not on file   Drug use: Not on file   Sexual activity: Not on file

## 2021-08-29 ENCOUNTER — Ambulatory Visit (INDEPENDENT_AMBULATORY_CARE_PROVIDER_SITE_OTHER): Payer: Medicare HMO

## 2021-08-29 ENCOUNTER — Telehealth: Payer: Self-pay

## 2021-08-29 DIAGNOSIS — Z7901 Long term (current) use of anticoagulants: Secondary | ICD-10-CM | POA: Diagnosis not present

## 2021-08-29 LAB — POCT INR: INR: 3 (ref 2.0–3.0)

## 2021-08-29 NOTE — Patient Instructions (Addendum)
Pre visit review using our clinic review tool, if applicable. No additional management support is needed unless otherwise documented below in the visit note.  Continue 5 mg daily except take 2.5 mg on Sun, Tues, and Fri. Recheck in 4 week while you are back in [place]. Contact coumadin clinic number, [phone], if any concerns or questions.

## 2021-08-29 NOTE — Telephone Encounter (Signed)
Contacted coumadin clinic and spoke with Deb, coumadin nurse, who reported they have talked to pt and he will f/u with them as directed. Verified dosing with nurse with and gave number to direct line for coumadin clinic. Advised if anything is needed in the future to use the direct line. Appreciated the conversation with the nurse and her concern for the pt. Will check in on pt chart in 4 weeks to see how he is progressing.

## 2021-08-29 NOTE — Progress Notes (Addendum)
Pt will be returning to [place] next week and will be there for at least 4 weeks if not longer. Advised pt he should have INR rechecked in about 4 weeks. Pt reports he will f/u with his clinic in [place]. He will f/u with this clinic when he is back in [place]. Pt reports he has enough warfarin for the time he will be awary but has phone number to coumadin clinic if anything is needed.  Continue 5 mg daily except take 2.5 mg on Sun, Tues, and Fri. Recheck in 4 week while you are back in [place]. Contact coumadin clinic number, [phone], if any concerns or questions.

## 2021-10-04 ENCOUNTER — Ambulatory Visit: Payer: Self-pay

## 2021-10-04 NOTE — Progress Notes (Signed)
Ending coumadin episodes. Pt's chart reports he is f/u with coumadin clinic at his home. Pt has moved back home and does not need any further f/u from this coumadin clinic. ?

## 2021-10-10 ENCOUNTER — Other Ambulatory Visit: Payer: Self-pay | Admitting: Urology

## 2021-10-10 DIAGNOSIS — D49512 Neoplasm of unspecified behavior of left kidney: Secondary | ICD-10-CM

## 2021-10-30 ENCOUNTER — Telehealth: Payer: Self-pay | Admitting: Family Medicine

## 2021-10-30 NOTE — Telephone Encounter (Addendum)
Contacted anticoagulation clinic in [place], that pt's warfarin is managed by and talked to [place], [place], at [phone], option 1. She will let the nurses know that take care of this pt tomorrow but she reports it will be fine for this anticoagulation clinic to monitor and dose the pt's warfarin while he is in [place]. Pt has a PCP in both locations.  ? ?Contacted pt's son, Daniel Sharp, and advised it is ok to manage pt's warfarin here. Made apt for tomorrow morning for pt. Dirk Patent attorney and verbalized understanding.  ?

## 2021-10-30 NOTE — Telephone Encounter (Signed)
Talked to pt's son, Daniel Sharp, who reports pt is here currently and does not know how long he will be here. He has an apt on Friday for discussion of his kidney mass and will likely decide at that time how long he will be here. Dirk reports pt will be gong back and forth to [place] and his other PCP and coumadin clinic. ?Advised it may not be good for the pt to have 2 different clinics managing his warfarin due to the high risk medication. Advised normally if pts of this coumadin clinic go out of town for an extended period there is an order signed for them to get a lab INR drawn and the results are faxed to this nurse who then calls the pt with dosing instructions. He reports they made pt an apt with Dr. Cherlynn Kaiser for this week but later cancelled it he thinks because she may be out of the office. Dr. Cherlynn Kaiser is out of the office this week.  ?Advised this nurse would f/u with the pt's coumadin clinic in [place] and then f/u with him to let him know what they would like to do regarding his warfarin management. Daniel Sharp was appreciative and verbalized understanding.  ? ? ?

## 2021-10-30 NOTE — Telephone Encounter (Signed)
Notes in chart state that coumadin has been discontinued because pt moved out of state. ? ?Son has requested to get care for pt. ?Son states pt does have a PCP in [place] who manages the Coumadin usage. ? ?Son states he does not understand why he cannot schedule a coumadin appointment but can schedule an OV with Dr. Cherlynn Kaiser through [facility]. Since the [facility] system shows Dr. Cherlynn Kaiser as pt's PCP. ? ?Please advise. ?

## 2021-10-31 ENCOUNTER — Ambulatory Visit (HOSPITAL_COMMUNITY)
Admission: RE | Admit: 2021-10-31 | Discharge: 2021-10-31 | Disposition: A | Discharge status: Home / Self Care | Payer: Medicare HMO | Source: Ambulatory Visit | Attending: Urology | Admitting: Urology

## 2021-10-31 ENCOUNTER — Ambulatory Visit (INDEPENDENT_AMBULATORY_CARE_PROVIDER_SITE_OTHER): Payer: Medicare HMO

## 2021-10-31 DIAGNOSIS — Z7901 Long term (current) use of anticoagulants: Secondary | ICD-10-CM

## 2021-10-31 DIAGNOSIS — D49512 Neoplasm of unspecified behavior of left kidney: Secondary | ICD-10-CM | POA: Diagnosis present

## 2021-10-31 LAB — POCT INR: INR: 3.1 — AB (ref 2.0–3.0)

## 2021-10-31 MED ORDER — GADOBUTROL 1 MMOL/ML IV SOLN
10.0000 mL | Freq: Once | INTRAVENOUS | Status: AC | PRN
  Administered 2021-10-31: 10 mL via INTRAVENOUS

## 2021-10-31 NOTE — Progress Notes (Signed)
I have reviewed and agree with note, evaluation, plan.   Weyman Bogdon, MD  

## 2021-10-31 NOTE — Progress Notes (Addendum)
Last INR result for pt in [place] is from 3/30 and INR was 3.3.  Pt reports he has had a lab INR drawn there between 3/30 and today but they seem to have lost the result.  ?Pt also reports with all of the traveling he has been doing he has not been eating greens as much as he normally dose.  ?Only take 1/2 tablet today and then continue 1 tablet daily except take 1/2 tablet on Tuesdays and Saturdays. Recheck in 2 weeks if still in [place]. ?

## 2021-10-31 NOTE — Telephone Encounter (Signed)
Pt was in this morning for coumadin clinic and reported he has had his INR drawn since 3/30, but the last result in the chart is from 3/30. Pt reports he had it drawn and they must have lost it but he is sure he did it. But he never received a call from the [facility] in [place] with dosing instructions.  ?Pt reported his current dosing and calendar was updated. INR today was 3.1 and pt was instructed to only take 1/2 dose today and then return to normal dosing.  ?Received a call from [place] at [facility], [phone], wondering if the pt has now transferred his care to this clinic. Advised pt reported he would be going between his home in [place] and his son's home in [place] and needs INR checked when he is here. Deb reports she is fine with this anticoag clinic to take over management if the pt is staying there but if he is going back and forth they should continue his management because the pt will most likely be there more than in [place].Deb reports she has given the pt a lab order for [facility] for when he is down here but does not appear to have used it. Updated Deb on pt's current dosing and instructions given today at apt. She said she would contact the pt and his son to advise they can go to the lab while here and [facility] will continue to manage his warfarin. Advised if anything further is needed to feel free to contact this anticoag clinic. Deb was appreciative and verbalized understanding.  ?

## 2021-10-31 NOTE — Patient Instructions (Addendum)
Pre visit review using our clinic review tool, if applicable. No additional management support is needed unless otherwise documented below in the visit note. ? ?Only take 1/2 tablet today and then continue 1 tablet daily except take 1/2 tablet on Tuesdays and Saturdays. Recheck in 2 weeks if still in [place] or check in [place] when you return. Number to anticoagulation clinic is [phone]. ?

## 2021-11-01 ENCOUNTER — Ambulatory Visit: Payer: Medicare HMO | Admitting: Family Medicine

## 2021-11-14 ENCOUNTER — Ambulatory Visit: Payer: Medicare HMO

## 2021-11-14 ENCOUNTER — Ambulatory Visit: Payer: Self-pay

## 2021-11-14 NOTE — Progress Notes (Signed)
Ending anticoag episodes. Pt will have warfarin managed in his home state, [place].  ?

## 2021-11-19 NOTE — Progress Notes (Signed)
?Cardiology Office Note:   ? ?Date:  11/21/2021  ? ?ID:  Daniel Sharp, DOB Sep 12, 1932, MRN [id number] ? ?PCP:  Daniel Crook, MD  ?Cardiologist:  Daniel Grooms, MD  ? ?Referring MD: Pa, [facility]  ? ?Chief Complaint  ?Patient presents with  ? Atrial Fibrillation  ? Cardiac Valve Problem  ?  Mitral valve repair  ? ? ?History of Present Illness:   ? ?Daniel Sharp is a 88 y.o. male with a hx of atrial fibrillation, MV repair,  primary hypertension , and referred to cardiology due to recent acute CHF off diuretic and preoperative CV exam.  Recent diagnosis of neoplasm of left kidney identified on chest abdomen and pelvic CT June 28, 2021 after a fall down a stairwell. ? ? ?The patient stopped using diuretic therapy late last year and developed acute volume overload.  Improved on lasix.  ? ?Daniel Sharp is accompanied by his son today.  I have reviewed records supplied by Daniel Sharp of [facility].  He has been found to have the left kidney lesion that is suspected to be cancer.  They are determining if surgical resection would be a reasonable option given his age and heart condition. ? 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  The repair went well.  Heart failure that it developed prior to repair improved.  The evaluation prior to mitral valve repair did not demonstrate any evidence of coronary disease.  He has subsequently done quite well but has chronic atrial fibrillation and is on Coumadin anticoagulation.  He has had no major bleeding issues.  He is still independent, travels back and forth between [place] and [place].  His son lives here in [place].  He is compliant with his medication regimen that includes HCTZ, Tenormin, and benazepril. ? ?He is on continuous prednisone therapy 5 mg twice daily.  He has a history of myasthenia gravis. ? ?He does not smoke or drink.  He denies orthopnea,  PND, chest pain, and palpitations.  He does have dyspnea on exertion that is been stable over the past 6 to 12 months.  He assumes this is related to orthopedic problems that have prevented him from getting repetitive aerobic activity. ? ?Past Medical History:  ?Diagnosis Date  ? A-fib ([place])   ? history of  ? H/O mitral valve repair   ? Hypertension   ? Ocular myasthenia gravis ([place])   ? takes pred  ? ? ?Past Surgical History:  ?Procedure Laterality Date  ? APPENDECTOMY    ? BACK SURGERY    ? cyst removed L3 and L4  ? CHOLECYSTECTOMY    ? MITRAL VALVE REPAIR    ? on coumadin  ? NOSE SURGERY    ? ? ?Current Medications: ?Current Meds  ?Medication Sig  ? atenolol (TENORMIN) 25 MG tablet Take 25 mg by mouth 2 (two) times daily.  ? benazepril (LOTENSIN) 40 MG tablet Take 40 mg by mouth daily.  ? furosemide (LASIX) 20 MG tablet Take 1 tablet (20 mg total) by mouth daily.  ? hydrochlorothiazide (HYDRODIURIL) 25 MG tablet Take 25 mg by mouth daily.  ? HYDROcodone-acetaminophen (NORCO/VICODIN) 5-325 MG tablet Take 1-2 tablets by mouth every 4 (four) hours as needed for moderate pain.  ? predniSONE (DELTASONE) 5 MG tablet Take 5 mg by mouth in the morning, at noon, and at bedtime. Continuous (first filled back in 02/2021 with 8 refills)  ?  warfarin (COUMADIN) 5 MG tablet Take 2.5-5 mg by mouth daily. 2.'5mg'$ -'5mg'$  daily, or as otherwise directed by anticoag visits (per [facility])  ?  ? ?Allergies:   Mestinon [pyridostigmine bromide] and Other  ? ?Social History  ? ?Socioeconomic History  ? Marital status: Widowed  ?  Spouse name: Not on file  ? Number of children: Not on file  ? Years of education: Not on file  ? Highest education level: Not on file  ?Occupational History  ? Not on file  ?Tobacco Use  ? Smoking status: Former  ?  Types: Cigarettes, Pipe, Cigars  ? Smokeless tobacco: Former  ?  Types: Chew  ?Vaping Use  ? Vaping Use: Never used  ?Substance and Sexual Activity  ? Alcohol use: Not on file  ? Drug use: Not on  file  ? Sexual activity: Not on file  ?Other Topics Concern  ? Not on file  ?Social History Narrative  ? Lives in [place], [place].  Son lives in [place]  ? ?Social Determinants of Health  ? ?Financial Resource Strain: Not on file  ?Food Insecurity: Not on file  ?Transportation Needs: Not on file  ?Physical Activity: Not on file  ?Stress: Not on file  ?Social Connections: Not on file  ?  ? ?Family History: ?The patient's family history is not on file. ? ?ROS:   ?Please see the history of present illness.    ?Prior appendectomy (laparoscopic), cholecystectomy (laparoscopic), and back surgery.  All other systems reviewed and are negative. ? ?EKGs/Labs/Other Studies Reviewed:   ? ?The following studies were reviewed today: ? ?2 D Doppler ECHOCARDIOGRAM: 10/05/2021: ?Interpretation Summary ?The left ventricular ejection fraction is 53%. The left ventricle is mildly ?dilated. ?The right ventricle is mild to moderately dilated. Right ventricle function is ?moderately decreased. ?The left atrium is moderately dilated. ?The right atrium is severely dilated. ?An annuloplasty ring is noted in the mitral position with fixed posterior ?leaflet. There is trace mitral regurgitation and normal gradient accross the ?valve. ?The IVC is dilated (>2.1 cm) with normal collapse during patient sniff (>50% ?collapse). The estimated RA pressure is 8 mm Hg (5-10 mm Hg)). ?Today's study was compared to one performed on 07/28/18. The LVEF was 56% on ?the prior report.  ? ?EKG:  EKG functional rhythm at 55 bpm.  Probably atrial fibrillation as there is RR variability.  Some leads appear to suggest the presence of a P wave. ? ?Recent Labs: ?08/15/2021: ALT 16; BUN 31; Creatinine, Ser 1.11; Hemoglobin 11.3; Platelets 190.0; Potassium 3.8; Sodium 142  ?Recent Lipid Panel ?No results found for: CHOL, TRIG, HDL, CHOLHDL, VLDL, LDLCALC, LDLDIRECT ? ?Physical Exam:   ? ?VS:  BP (!) 160/60   Pulse (!) 55   Ht '6\' 1"'$  (1.854 m)   Wt 225 lb 3.2 oz (102.2  kg)   SpO2 97%   BMI 29.71 kg/m?    ? ?Wt Readings from Last 3 Encounters:  ?11/21/21 225 lb 3.2 oz (102.2 kg)  ?08/15/21 221 lb 2 oz (100.3 kg)  ?08/01/21 216 lb (98 kg)  ?  ? ?GEN: Compatible with age.  Abdominal obesity.. No acute distress ?HEENT: Normal ?NECK: No JVD. ?LYMPHATICS: No lymphadenopathy ?CARDIAC: No significant murmur is heard.  Murmur. IIRR without gallop, but there is left greater than right lower extremity edema.  Bilateral compression stockings are being worn today. ?VASCULAR:  Normal Pulses. No bruits. ?RESPIRATORY:  Clear to auscultation without rales, wheezing or rhonchi  ?ABDOMEN: Soft, non-tender, non-distended, No pulsatile mass, ?  MUSCULOSKELETAL: No deformity  ?SKIN: Warm and dry ?NEUROLOGIC:  Alert and oriented x 3 ?PSYCHIATRIC:  Normal affect  ? ?ASSESSMENT:   ? ?1. History of mitral valve repair   ?2. Preoperative evaluation to rule out surgical contraindication   ?3. Primary hypertension   ?4. Chronic anticoagulation   ?5. Myasthenia gravis ([place])   ? ?PLAN:   ? ?In order of problems listed above: ? ?By auscultation, and a relatively recent echo performed in [place] (10/05/2021) which is summarized above, mitral valve function is normal.  No significant leak or evidence of mitral stenosis.  There is no clinical evidence of heart failure on exam. ?The patient's risk for general anesthesia and noncardiac surgery will be elevated and classified as high for possible complications that could include acute heart failure or cardiac rhythm disturbance.  This elevated risk is related to his known valvular heart disease, age, and myasthenia gravis.  I would anticipate that any of these issues could be easily controlled.  He is cleared for upcoming general anesthesia related to left robotic nephrectomy.  No features exist at this time which would make surgery prohibitive. ?Blood pressure is slightly higher than we would like although at age 27, systolic blood pressures in the 140 to 150 mmHg  range are acceptable. ?Continue Coumadin therapy.  This is being followed by primary care at [facility]. ?This problem appears to be stable.  I am not sure who is the healthcare pr

## 2021-11-21 ENCOUNTER — Encounter: Payer: Self-pay | Admitting: Interventional Cardiology

## 2021-11-21 ENCOUNTER — Ambulatory Visit: Payer: Medicare HMO | Admitting: Interventional Cardiology

## 2021-11-21 ENCOUNTER — Telehealth: Payer: Self-pay | Admitting: Interventional Cardiology

## 2021-11-21 ENCOUNTER — Other Ambulatory Visit: Payer: Self-pay | Admitting: Urology

## 2021-11-21 VITALS — BP 160/60 | HR 55 | Ht 73.0 in | Wt 225.2 lb

## 2021-11-21 DIAGNOSIS — Z01810 Encounter for preprocedural cardiovascular examination: Secondary | ICD-10-CM | POA: Diagnosis not present

## 2021-11-21 DIAGNOSIS — Z7901 Long term (current) use of anticoagulants: Secondary | ICD-10-CM | POA: Diagnosis not present

## 2021-11-21 DIAGNOSIS — G7000 Myasthenia gravis without (acute) exacerbation: Secondary | ICD-10-CM | POA: Diagnosis not present

## 2021-11-21 DIAGNOSIS — I10 Essential (primary) hypertension: Secondary | ICD-10-CM

## 2021-11-21 DIAGNOSIS — Z9889 Other specified postprocedural states: Secondary | ICD-10-CM

## 2021-11-21 DIAGNOSIS — Z01818 Encounter for other preprocedural examination: Secondary | ICD-10-CM

## 2021-11-21 NOTE — Patient Instructions (Signed)
Medication Instructions:  Your physician recommends that you continue on your current medications as directed. Please refer to the Current Medication list given to you today.  *If you need a refill on your cardiac medications before your next appointment, please call your pharmacy*  Lab Work: NONE  Testing/Procedures: NONE  Follow-Up: At [facility], you and your health needs are our priority.  As part of our continuing mission to provide you with exceptional heart care, we have created designated Provider Care Teams.  These Care Teams include your primary Cardiologist (physician) and Advanced Practice Providers (APPs -  Physician Assistants and Nurse Practitioners) who all work together to provide you with the care you need, when you need it.  Your next appointment:   6 month(s)  The format for your next appointment:   In Person  Provider:   Henry W Smith III, MD {  Important Information About Sugar       

## 2021-11-21 NOTE — Telephone Encounter (Signed)
Coni from [facility] called regarding the patient being cleared for surgery at his NP appt today. She states she is needing Coumadin being held 5 days prior addressed as well and then faxed over to [phone]. Please advise.  ?

## 2021-11-22 NOTE — Telephone Encounter (Signed)
Covering preop today. Dr. Tamala Julian addressed preop in yesterday's visit, did not specifically comment on Coumadin except this is being managed in PCP office. Will see if pharm will comment on holding time (hx afib per notes) and we can relay to surgeon's office with the reminder that we otherwise are not managing his anticoagulation/levels. Will bundle final rec once known.

## 2021-11-22 NOTE — Telephone Encounter (Signed)
Patient with diagnosis of afib on warfarin for anticoagulation.    Procedure: nephrectomy Date of procedure: 01/22/22  CHA2DS2-VASc Score = 5  This indicates a 7.2% annual risk of stroke. The patient's score is based upon: CHF History: 1 HTN History: 1 Diabetes History: 0 Stroke History: 0 Vascular Disease History: 1 Age Score: 2 Gender Score: 0  CrCl 4m/min Platelet count 190K  Per office protocol, patient can hold warfarin for 5 days prior to procedure. Patient will not need bridging with Lovenox (enoxaparin) around procedure.

## 2021-11-22 NOTE — Telephone Encounter (Signed)
   Patient Name: Glenwood Revoir  DOB: 1933-02-16 MRN: [id number]  Primary Cardiologist: Sinclair Grooms, MD  Chart reviewed as part of pre-operative protocol coverage.   - Medical clearance addressed in [place] yesterday with Dr. Tamala Julian.  - Regarding anticoagulation, per pharmD review, per office protocol, patient can hold warfarin for 5 days prior to procedure. Patient will not need bridging with Lovenox (enoxaparin) around procedure. Please note we otherwise do not manage the patient's anticoagulation otherwise - per last primary care note anticoag 11/14/21, patient now has their warfarin managed in home state of [place]. I called patient to make sure he notifies the team managing his blood thinners of the impending hold. He was unavailable so I got second number and spoke with son [name] listed on [place]. He states the patient has someone following his INR in both [place] and [place] but more recently will be down in [place] for quite a while until surgery. I recommended he contact the primary care office where he gets his INR checked to discuss getting back on track for routine monitoring prior to surgery as well as to let them know that he will be having to hold the blood thinner prior to surgery. He verbalized understanding. Will cc to primary care anticoag RN as well.  - Will fax copy of Dr. Thompson Caul note and this summary to requesting surgeon at number provided. Please call with questions.   Charlie Pitter, PA-C 11/22/2021, 3:31 PM

## 2021-11-26 NOTE — Telephone Encounter (Signed)
Retta Mac, RN at [facility], [phone], who manage pt's warfarin dosing. We have talked in the past that it is safer for the pt if only one clinic is dosing the pt. Deb agrees. Advised of nephrectomy on 01/22/22. Per cardiology in [place] no lovenox bridge is needed and pt can hold warfarin for 5 days. [facility] will be dosing the pt's warfarin around the procedure. Deb will contact pt and let him and his son know again that they will be managing his warfarin. Deb can see notes in pt's chart, so has all information needed.  Sending to PCP here as Juluis Rainier.

## 2021-12-12 ENCOUNTER — Ambulatory Visit: Payer: Medicare HMO | Admitting: Family Medicine

## 2022-01-04 ENCOUNTER — Ambulatory Visit (INDEPENDENT_AMBULATORY_CARE_PROVIDER_SITE_OTHER): Payer: Medicare HMO | Admitting: Family Medicine

## 2022-01-04 ENCOUNTER — Encounter: Payer: Self-pay | Admitting: Family Medicine

## 2022-01-04 VITALS — BP 140/80 | HR 56 | Temp 98.0°F | Ht 73.0 in | Wt 229.0 lb

## 2022-01-04 DIAGNOSIS — Z952 Presence of prosthetic heart valve: Secondary | ICD-10-CM

## 2022-01-04 DIAGNOSIS — N2889 Other specified disorders of kidney and ureter: Secondary | ICD-10-CM

## 2022-01-04 DIAGNOSIS — R600 Localized edema: Secondary | ICD-10-CM

## 2022-01-04 MED ORDER — FUROSEMIDE 20 MG PO TABS: 20.0000 mg | ORAL_TABLET | Freq: Every day | ORAL | 1 refills | Status: DC

## 2022-01-04 NOTE — Progress Notes (Addendum)
COVID Vaccine Completed:  Yes  Date of COVID positive in last 90 days:  PCP - Esther Hardy, MD Cardiologist - Daneen Schick, MD Neurologist - Annamary Carolin, DO  Cardiac clearance in Epic dated 11-21-21 by Dr. Tamala Julian  Chest x-ray - 1 view 07-05-21 Epic EKG - 11-21-21 Epic Stress Test - greater than 2 years [facility] ECHO - 10-05-21 [facility] Cardiac Cath - greater than 2 years [facility] Pacemaker/ICD device last checked: Spinal Cord Stimulator: Holter Monitor 2009 [facility]  Bowel Prep -   Sleep Study -  CPAP -   Fasting Blood Sugar -   Checks Blood Sugar _____ times a day  Blood Thinner Instructions: Coumadin.  Hold x5 days, no Lovenox needed Aspirin Instructions: Last Dose:  Activity level:  Can go up a flight of stairs and perform activities of daily living without stopping and without symptoms of chest pain or shortness of breath.  Able to exercise without symptoms  Unable to go up a flight of stairs without symptoms of     Anesthesia review:  Afib, h/o mitral valve repair, HTN, hx of cardioversion.  Hx of ocular myasthenia gravis  Patient denies shortness of breath, fever, cough and chest pain at PAT appointment  Patient verbalized understanding of instructions that were given to them at the PAT appointment. Patient was also instructed that they will need to review over the PAT instructions again at home before surgery.

## 2022-01-04 NOTE — Progress Notes (Signed)
Subjective:     Patient ID: Daniel Sharp, male    DOB: [birth year], 88 y.o.   MRN: [id number]  Chief Complaint  Patient presents with   Follow-up    HPI-here w/son   Myesthenia Gravis-neurologist-on '5mg'$  pred. Still has double vision when looks to side.  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 Afib, mitral valve replacement-bp good, bleeds easily as on coumadin.  Bp's 130-140's.  Does get DOE-which was better when on lasix, but not taking it.  No cp Edema-wearing compression stockings.  Not taking lasix at all.  Has leg squeezers- but only uses once/wk Will be going back to [place] in august  There are no preventive care reminders to display for this patient.  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     benazepril (LOTENSIN) 40 MG tablet Take 40 mg by mouth daily.     Cyanocobalamin (B-12 PO) Take 1 tablet by mouth every 7 (seven) days.     Gluc-Chonn-MSM-Boswellia-Vit D (GLUCOSAMINE CHONDROITIN + D3) TABS Take 1 tablet by mouth daily.     hydrochlorothiazide (HYDRODIURIL) 25 MG tablet Take 25 mg by mouth daily.     predniSONE (DELTASONE) 5 MG tablet Take 5 mg by mouth daily.     warfarin (COUMADIN) 5 MG tablet Take 2.5-5 mg by mouth daily. Take 2.5 mg by mouth on Tuesday and Friday, take 5 mg by mouth on Monday, Wednesday, Thursday, Saturday and Sunday.     HYDROcodone-acetaminophen (NORCO/VICODIN) 5-325 MG tablet Take 1-2 tablets by mouth every 4 (four) hours as needed for moderate pain. (Patient not taking: Reported on  01/04/2022) 20 tablet 0   furosemide (LASIX) 20 MG tablet Take 1 tablet (20 mg total) by mouth daily. (Patient not taking: Reported on 01/04/2022) 90 tablet 1   No facility-administered medications prior to visit.    Allergies  Allergen Reactions   Mestinon [Pyridostigmine Bromide] Diarrhea   ROS neg/noncontributory except as noted HPI/below Can cough if swallowing problems d/t MG No n/v.  Bm bid.  Had pneumonia in March  Dr. Ruby Cola family med-[place] in March.       Objective:     BP 140/80   Pulse (!) 56   Temp 98 F (36.7 C) (Temporal)   Ht '6\' 1"'$  (1.854 m)   Wt 229 lb (103.9 kg)   SpO2 95%   BMI 30.21 kg/m  Wt Readings from Last 3 Encounters:  01/04/22 229 lb (103.9 kg)  11/21/21 225 lb 3.2 oz (102.2 kg)  08/15/21 221 lb 2 oz (100.3 kg)    Physical Exam   Gen: WDWN NAD elder WM HEENT: NCAT, conjunctiva not injected, sclera nonicteric NECK:  supple, no thyromegaly, no nodes, no carotid bruits CARDIAC: Candiss Norse, S1S2+, +1/6 murmur. DP 1+B LUNGS: CTAB. No wheezes ABDOMEN:  BS+, soft, NTND, No HSM, no masses EXT:  chronic tense 2+ edema MSK:  cane.  NEURO: A&O x3.  CN II-XII intact.  PSYCH: normal mood. Good eye contact     Assessment & Plan:   Problem List Items Addressed This Visit       Other   H/O mitral valve replacement   Other Visit Diagnoses     Left kidney mass    -  Primary   Lower extremity edema       Relevant Medications   furosemide (LASIX) 20 MG tablet      L renal mass-having surg 7/18.  Pt high risk-age, heart,anticoag,HTN,MG, etc.  He is at optimal medical mgmt at this time.   H/o mitral valve replacement, parox a fib-on chronic coumadin.  Managed by clinic in [place].  Has seen Card here. Lower extremity edema-chronic.  Pt not compliant w/meds/squeezers.  Advised to take lasix '20mg'$  daily, hold hctz and use squeezers at least qod to optimize health in prep for surgery.   F/u 61mo Meds ordered this encounter  Medications   furosemide  (LASIX) 20 MG tablet    Sig: Take 1 tablet (20 mg total) by mouth daily.    Dispense:  90 tablet    Refill:  1    AWellington Hampshire MD

## 2022-01-04 NOTE — Patient Instructions (Addendum)
DUE TO COVID-19 ONLY TWO VISITORS  (aged 86 and older)  IS ALLOWED TO COME WITH YOU AND STAY IN THE WAITING ROOM ONLY DURING PRE OP AND PROCEDURE.   **NO VISITORS ARE ALLOWED IN THE SHORT STAY AREA OR RECOVERY ROOM!!**  IF YOU WILL BE ADMITTED INTO THE HOSPITAL YOU ARE ALLOWED ONLY FOUR SUPPORT PEOPLE DURING VISITATION HOURS ONLY (7 AM -8PM)   The support person(s) must pass our screening, gel in and out Visitors GUEST BADGE MUST BE WORN VISIBLY  One adult visitor may remain with you overnight and MUST be in the room by 8 P.M.   You are not required to [employer] often Do NOT share personal items Notify your provider if you are in close contact with someone who has COVID or you develop fever 100.4 or greater, new onset of sneezing, cough, sore throat, shortness of breath or body aches.        Your procedure is scheduled on:  01-22-22   Report to [facility] Main Entrance    Report to admitting at 5:15 AM   Call this number if you have problems the morning of surgery [phone]   Follow a clear liquid diet the day before surgery    Do not eat food or drink liquids :After Midnight the night before surgery  Clear Liquid Diet Water Black Coffee (sugar ok, NO MILK/CREAM OR CREAMERS)  Tea (sugar ok, NO MILK/CREAM OR CREAMERS) regular and decaf                             Plain Jell-O (NO RED)                                           Fruit ices (not with fruit pulp, NO RED)                                     Popsicles (NO RED)                                                                  Juice: apple, WHITE grape, WHITE cranberry Sports drinks like Gatorade (NO RED) Clear broth(vegetable,chicken,beef)                       If you have questions, please contact your surgeon's office.   FOLLOW BOWEL PREP AND ANY ADDITIONAL PRE OP INSTRUCTIONS YOU RECEIVED FROM YOUR SURGEON'S OFFICE!!!   - Follow a clear liquid diet the day before surgery      Oral  Hygiene is also important to reduce your risk of infection.                                    Remember - BRUSH YOUR TEETH THE MORNING OF SURGERY WITH YOUR REGULAR TOOTHPASTE   Do NOT smoke after Midnight   Take these medicines the morning of surgery with A SIP OF WATER: Atenolol  DO NOT [place]. PHARMACY WILL DISPENSE MEDICATIONS LISTED ON YOUR MEDICATION LIST TO YOU DURING YOUR ADMISSION [place]!  **Hold Coumadin 5 days before surgery                               You may not have any metal on your body including  jewelry, and body piercing             Do not wear  lotions, powders, cologne, or deodorant              Men may shave face and neck.    Contacts, dentures or bridgework may not be worn into surgery.   Bring small overnight bag day of surgery.  Do not bring valuables to the hospital. [place].   Special Instructions: Bring a copy of your healthcare power of attorney and living will documents the day of surgery if you haven't scanned them before.  Please read over the following fact sheets you were given: IF YOU HAVE QUESTIONS ABOUT YOUR PRE-OP INSTRUCTIONS PLEASE CALL [place] - Preparing for Surgery Before surgery, you can play an important role.  Because skin is not sterile, your skin needs to be as free of germs as possible.  You can reduce the number of germs on your skin by washing with CHG (chlorahexidine gluconate) soap before surgery.  CHG is an antiseptic cleaner which kills germs and bonds with the skin to continue killing germs even after washing. Please DO NOT use if you have an allergy to CHG or antibacterial soaps.  If your skin becomes reddened/irritated stop using the CHG and inform your nurse when you arrive at Short Stay. Do not shave (including legs and underarms) for at least 48 hours prior to the first CHG shower.  You may shave your  face/neck.  Please follow these instructions carefully:  1.  Shower with CHG Soap the night before surgery and the  morning of surgery.  2.  If you choose to wash your hair, wash your hair first as usual with your normal  shampoo.  3.  After you shampoo, rinse your hair and body thoroughly to remove the shampoo.                             4.  Use CHG as you would any other liquid soap.  You can apply chg directly to the skin and wash.  Gently with a scrungie or clean washcloth.  5.  Apply the CHG Soap to your body ONLY FROM THE NECK DOWN.   Do   not use on face/ open                           Wound or open sores. Avoid contact with eyes, ears mouth and   genitals (private parts).                       Wash face,  Genitals (private parts) with your normal soap.             6.  Wash thoroughly, paying special attention to the area where your    surgery  will be performed.  7.  Thoroughly rinse your body with warm water from the  neck down.  8.  DO NOT shower/wash with your normal soap after using and rinsing off the CHG Soap.                9.  Pat yourself dry with a clean towel.            10.  Wear clean pajamas.            11.  Place clean sheets on your bed the night of your first shower and do not  sleep with pets. Day of Surgery : Do not apply any lotions/deodorants the morning of surgery.  Please wear clean clothes to the hospital/surgery center.  FAILURE TO FOLLOW THESE INSTRUCTIONS MAY RESULT IN THE CANCELLATION OF YOUR SURGERY  PATIENT SIGNATURE_________________________________  NURSE SIGNATURE__________________________________  ________________________________________________________________________     Adam Phenix  An incentive spirometer is a tool that can help keep your lungs clear and active. This tool measures how well you are filling your lungs with each breath. Taking long deep breaths may help reverse or decrease the chance of developing breathing (pulmonary)  problems (especially infection) following: A long period of time when you are unable to move or be active. BEFORE THE PROCEDURE  If the spirometer includes an indicator to show your best effort, your nurse or respiratory therapist will set it to a desired goal. If possible, sit up straight or lean slightly forward. Try not to slouch. Hold the incentive spirometer in an upright position. INSTRUCTIONS FOR USE  Sit on the edge of your bed if possible, or sit up as far as you can in bed or on a chair. Hold the incentive spirometer in an upright position. Breathe out normally. Place the mouthpiece in your mouth and seal your lips tightly around it. Breathe in slowly and as deeply as possible, raising the piston or the ball toward the top of the column. Hold your breath for 3-5 seconds or for as long as possible. Allow the piston or ball to fall to the bottom of the column. Remove the mouthpiece from your mouth and breathe out normally. Rest for a few seconds and repeat Steps 1 through 7 at least 10 times every 1-2 hours when you are awake. Take your time and take a few normal breaths between deep breaths. The spirometer may include an indicator to show your best effort. Use the indicator as a goal to work toward during each repetition. After each set of 10 deep breaths, practice coughing to be sure your lungs are clear. If you have an incision (the cut made at the time of surgery), support your incision when coughing by placing a pillow or rolled up towels firmly against it. Once you are able to get out of bed, walk around indoors and cough well. You may stop using the incentive spirometer when instructed by your caregiver.  RISKS AND COMPLICATIONS Take your time so you do not get dizzy or light-headed. If you are in pain, you may need to take or ask for pain medication before doing incentive spirometry. It is harder to take a deep breath if you are having pain. AFTER USE Rest and breathe slowly and  easily. It can be helpful to keep track of a log of your progress. Your caregiver can provide you with a simple table to help with this. If you are using the spirometer at home, follow these instructions: [place] IF:  You are having difficultly using the spirometer. You have trouble using the spirometer as  often as instructed. Your pain medication is not giving enough relief while using the spirometer. You develop fever of 100.5 F (38.1 C) or higher. SEEK IMMEDIATE MEDICAL CARE IF:  You cough up bloody sputum that had not been present before. You develop fever of 102 F (38.9 C) or greater. You develop worsening pain at or near the incision site. MAKE SURE YOU:  Understand these instructions. Will watch your condition. Will get help right away if you are not doing well or get worse. Document Released: 11/04/2006 Document Revised: 09/16/2011 Document Reviewed: 01/05/2007 ExitCare Patient Information 2014 ExitCare, [place].   ________________________________________________________________________  WHAT IS A BLOOD TRANSFUSION? Blood Transfusion Information  A transfusion is the replacement of blood or some of its parts. Blood is made up of multiple cells which provide different functions. Red blood cells carry oxygen and are used for blood loss replacement. White blood cells fight against infection. Platelets control bleeding. Plasma helps clot blood. Other blood products are available for specialized needs, such as hemophilia or other clotting disorders. BEFORE THE TRANSFUSION  Who gives blood for transfusions?  Healthy volunteers who are fully evaluated to make sure their blood is safe. This is blood bank blood. Transfusion therapy is the safest it has ever been in the practice of medicine. Before blood is taken from a donor, a complete history is taken to make sure that person has no history of diseases nor engages in risky social behavior (examples are intravenous drug  use or sexual activity with multiple partners). The donor's travel history is screened to minimize risk of transmitting infections, such as malaria. The donated blood is tested for signs of infectious diseases, such as HIV and hepatitis. The blood is then tested to be sure it is compatible with you in order to minimize the chance of a transfusion reaction. If you or a relative donates blood, this is often done in anticipation of surgery and is not appropriate for emergency situations. It takes many days to process the donated blood. RISKS AND COMPLICATIONS Although transfusion therapy is very safe and saves many lives, the main dangers of transfusion include:  Getting an infectious disease. Developing a transfusion reaction. This is an allergic reaction to something in the blood you were given. Every precaution is taken to prevent this. The decision to have a blood transfusion has been considered carefully by your caregiver before blood is given. Blood is not given unless the benefits outweigh the risks. AFTER THE TRANSFUSION Right after receiving a blood transfusion, you will usually feel much better and more energetic. This is especially true if your red blood cells have gotten low (anemic). The transfusion raises the level of the red blood cells which carry oxygen, and this usually causes an energy increase. The nurse administering the transfusion will monitor you carefully for complications. HOME CARE INSTRUCTIONS  No special instructions are needed after a transfusion. You may find your energy is better. Speak with your caregiver about any limitations on activity for underlying diseases you may have. SEEK MEDICAL CARE IF:  Your condition is not improving after your transfusion. You develop redness or irritation at the intravenous (IV) site. SEEK IMMEDIATE MEDICAL CARE IF:  Any of the following symptoms occur over the next 12 hours: Shaking chills. You have a temperature by mouth above 102 F  (38.9 C), not controlled by medicine. Chest, back, or muscle pain. People around you feel you are not acting correctly or are confused. Shortness of breath or difficulty breathing. Dizziness and fainting.  You get a rash or develop hives. You have a decrease in urine output. Your urine turns a dark color or changes to pink, red, or brown. Any of the following symptoms occur over the next 10 days: You have a temperature by mouth above 102 F (38.9 C), not controlled by medicine. Shortness of breath. Weakness after normal activity. The white part of the eye turns yellow (jaundice). You have a decrease in the amount of urine or are urinating less often. Your urine turns a dark color or changes to pink, red, or brown. Document Released: 06/21/2000 Document Revised: 09/16/2011 Document Reviewed: 02/08/2008 [facility] Patient Information 2014 [place], [place].  _______________________________________________________________________

## 2022-01-04 NOTE — Patient Instructions (Signed)
Lasix instead of HCTZ.   Do your leg machine every other day.

## 2022-01-09 ENCOUNTER — Encounter (HOSPITAL_COMMUNITY)
Admission: RE | Admit: 2022-01-09 | Discharge: 2022-01-09 | Disposition: A | Discharge status: Home / Self Care | Payer: Medicare HMO | Source: Ambulatory Visit | Attending: Urology | Admitting: Urology

## 2022-01-09 ENCOUNTER — Encounter (HOSPITAL_COMMUNITY): Payer: Self-pay

## 2022-01-09 ENCOUNTER — Other Ambulatory Visit: Payer: Self-pay

## 2022-01-09 VITALS — BP 180/83 | HR 50 | Temp 97.9°F | Resp 20 | Ht 73.0 in | Wt 227.1 lb

## 2022-01-09 DIAGNOSIS — Z01812 Encounter for preprocedural laboratory examination: Secondary | ICD-10-CM | POA: Insufficient documentation

## 2022-01-09 DIAGNOSIS — Z7901 Long term (current) use of anticoagulants: Secondary | ICD-10-CM | POA: Diagnosis not present

## 2022-01-09 HISTORY — DX: Dyspnea, unspecified: R06.00

## 2022-01-09 HISTORY — DX: Unspecified osteoarthritis, unspecified site: M19.90

## 2022-01-09 HISTORY — DX: Pneumonia, unspecified organism: J18.9

## 2022-01-09 LAB — CBC
HCT: 36.4 % — ABNORMAL LOW (ref 39.0–52.0)
Hemoglobin: 11.8 g/dL — ABNORMAL LOW (ref 13.0–17.0)
MCH: 31.9 pg (ref 26.0–34.0)
MCHC: 32.4 g/dL (ref 30.0–36.0)
MCV: 98.4 fL (ref 80.0–100.0)
Platelets: 162 10*3/uL (ref 150–400)
RBC: 3.7 MIL/uL — ABNORMAL LOW (ref 4.22–5.81)
RDW: 13.4 % (ref 11.5–15.5)
WBC: 7.7 10*3/uL (ref 4.0–10.5)
nRBC: 0 % (ref 0.0–0.2)

## 2022-01-09 LAB — BASIC METABOLIC PANEL
Anion gap: 6 (ref 5–15)
BUN: 31 mg/dL — ABNORMAL HIGH (ref 8–23)
CO2: 32 mmol/L (ref 22–32)
Calcium: 10.6 mg/dL — ABNORMAL HIGH (ref 8.9–10.3)
Chloride: 105 mmol/L (ref 98–111)
Creatinine, Ser: 1.28 mg/dL — ABNORMAL HIGH (ref 0.61–1.24)
GFR, Estimated: 55 mL/min — ABNORMAL LOW (ref >60)
Glucose, Bld: 87 mg/dL (ref 70–99)
Potassium: 3.9 mmol/L (ref 3.5–5.1)
Sodium: 143 mmol/L (ref 135–145)

## 2022-01-15 ENCOUNTER — Telehealth: Payer: Self-pay

## 2022-01-15 NOTE — Progress Notes (Signed)
Anesthesia Chart Review   Case: [id number] Date/Time: 01/22/22 0700   Procedure: XI ROBOTIC ASSITED PARTIAL NEPHRECTOMY (Left)   Anesthesia type: General   Pre-op diagnosis: LEFT RENAL MASS   Location: [facility] ROOM 03 / [facility]   Surgeons: Ceasar Mons, MD       DISCUSSION:88 y.o. former smoker with h/o ocular myasthenia (on daily '5mg'$  prednisone), HTN, a-fib, s/ MVR, left renal mass scheduled for above procedure 01/22/2022 with Dr. Harrell Gave Lovena Neighbours.   Pt on Coumadin, advised to hold for 5 days. Per cardiology no lovenox bridge needed.    Pt last seen by PCP 01/04/2022. Per OV note, "He is at optimal medical mgmt at this time."  Pt last seen by cardiology 11/21/2021. Per OV note, "The patient's risk for general anesthesia and noncardiac surgery will be elevated and classified as high for possible complications that could include acute heart failure or cardiac rhythm disturbance.  This elevated risk is related to his known valvular heart disease, age, and myasthenia gravis.  I would anticipate that any of these issues could be easily controlled.  He is cleared for upcoming general anesthesia related to left robotic nephrectomy.  No features exist at this time which would make surgery prohibitive."  Anticipate pt can proceed with planned procedure barring acute status change.   VS: BP (!) 180/83   Pulse (!) 50   Temp 36.6 C (Oral)   Resp 20   Ht '6\' 1"'$  (1.854 m)   Wt 103 kg   SpO2 96%   BMI 29.96 kg/m   PROVIDERS: [name], MD is PCP   Cardiologist - Daneen Schick, MD LABS: Labs reviewed: Acceptable for surgery. (all labs ordered are listed, but only abnormal results are displayed)  Labs Reviewed  CBC - Abnormal; Notable for the following components:      Result Value   RBC 3.70 (*)    Hemoglobin 11.8 (*)    HCT 36.4 (*)    All other components within normal limits  BASIC METABOLIC PANEL - Abnormal; Notable for the following components:   BUN 31 (*)     Creatinine, Ser 1.28 (*)    Calcium 10.6 (*)    GFR, Estimated 55 (*)    All other components within normal limits  TYPE AND SCREEN     IMAGES:   EKG:   CV: Echo 10/05/2021 Interpretation Summary  The left ventricular ejection fraction is 53%. The left ventricle is mildly  dilated.  The right ventricle is mild to moderately dilated. Right ventricle function is  moderately decreased.  The left atrium is moderately dilated.  The right atrium is severely dilated.  An annuloplasty ring is noted in the mitral position with fixed posterior  leaflet. There is trace mitral regurgitation and normal gradient accross the  valve.  The IVC is dilated (>2.1 cm) with normal collapse during patient sniff (>50%  collapse). The estimated RA pressure is 8 mm Hg (5-10 mm Hg)).  Today's study was compared to one performed on 07/28/18. The LVEF was 56% on  the prior report.  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   Pneumonia     Past Surgical History:  Procedure Laterality Date   APPENDECTOMY     BACK SURGERY  cyst removed L3 and L4   CHOLECYSTECTOMY     FINGER SURGERY Right    R hand   MITRAL VALVE REPAIR     on coumadin   NOSE SURGERY      MEDICATIONS:  atenolol (TENORMIN) 25 MG tablet   benazepril (LOTENSIN) 40 MG tablet   Cyanocobalamin (B-12 PO)   furosemide (LASIX) 20 MG tablet   Gluc-Chonn-MSM-Boswellia-Vit D (GLUCOSAMINE CHONDROITIN + D3) TABS   hydrochlorothiazide (HYDRODIURIL) 25 MG tablet   HYDROcodone-acetaminophen (NORCO/VICODIN) 5-325 MG tablet   predniSONE (DELTASONE) 5 MG tablet   warfarin (COUMADIN) 5 MG tablet   No current facility-administered medications for this encounter.    Konrad Felix Ward, PA-C [facility] [phone]

## 2022-01-15 NOTE — Telephone Encounter (Signed)
Received call from [place], RN coumadin clinic at [facility] in [place]. She reports pt reported today that he will be staying with his son for until mid August and would like his warfarin managed at [facility] coumadin clinic. Pt is scheduled for partial nephrectomy on 7/18 and will be on a 5 day hold of warfarin, no lovenox bridge needed per cardiology. Next INR check should be tomorrow. Number to reach Junie Panning is [phone]. Advised this nurse will f/u with pt during his stay in [place] with his son and through his surgery.  Pt is currently taking 5 mg daily except take 2.5 mg on Tuesdays and Saturdays.   LVM for pt to return call to schedule INR apt for tomorrow at [facility].   Proposed hold for warfarin around surgery. If pt is remaining in hospital after surgery the warfarin will be managed by hospital.  01/17/22: Take last dose of coumadin 01/18/22: No coumadin 01/19/22: No coumadin 01/20/22: No coumadin 01/21/22: No coumadin  01/22/22: SURGERY; NO COUMADIN  01/23/22: Take 1 1/2 tablets (7.5 mg) coumadin 01/24/22: Take 1 1/2 tablets (7.5 mg) coumadin 01/25/22: Take 1 1/2 tablets (7.5 mg) coumadin 01/26/22: Take 1 tablet (5 mg) coumadin 01/27/22: Take 1 tablet (5 mg) coumadin 01/28/22: Take 1 tablet (5 mg) coumadin 01/29/22: Take 1/2 tablet (2.5 mg) coumadin 01/30/22: Recheck INR; do not take coumadin until after INR is checked   Contacted pt's son, [name], and advised need for apt for tomorrow. Scheduled pt for 9:30 at [facility].

## 2022-01-15 NOTE — Anesthesia Preprocedure Evaluation (Addendum)
Anesthesia Evaluation  Patient identified by MRN, date of birth, ID band Patient awake    Reviewed: Allergy & Precautions, NPO status , Patient's Chart, lab work & pertinent test results  History of Anesthesia Complications Negative for: history of anesthetic complications  Airway Mallampati: II  TM Distance: >3 FB Neck ROM: Full    Dental  (+) Chipped, Caps, Dental Advisory Given   Pulmonary former smoker,    breath sounds clear to auscultation       Cardiovascular hypertension, Pt. on medications (-) angina+ dysrhythmias Atrial Fibrillation + Valvular Problems/Murmurs (s/p MV repair)  Rhythm:Regular Rate:Normal  09/2021 ECHO: LVEF 53%, mild LV dilation, mod decreased RVF, trace MR s/p MV repair and annuloplasty ring   Neuro/Psych Ocular myasthenia: prednisone (last dose yesterday)    GI/Hepatic negative GI ROS, Neg liver ROS,   Endo/Other  negative endocrine ROS  Renal/GU negative Renal ROS     Musculoskeletal  (+) Arthritis ,   Abdominal   Peds  Hematology Coumadin   Anesthesia Other Findings   Reproductive/Obstetrics                           Anesthesia Physical Anesthesia Plan  ASA: 3  Anesthesia Plan: General   Post-op Pain Management: Tylenol PO (pre-op)*   Induction: Intravenous  PONV Risk Score and Plan: 2 and Ondansetron and Dexamethasone  Airway Management Planned: Oral ETT  Additional Equipment: None  Intra-op Plan:   Post-operative Plan: Extubation in OR  Informed Consent: I have reviewed the patients History and Physical, chart, labs and discussed the procedure including the risks, benefits and alternatives for the proposed anesthesia with the patient or authorized representative who has indicated his/her understanding and acceptance.     Dental advisory given  Plan Discussed with: CRNA and Surgeon  Anesthesia Plan Comments: (See PAT note 01/09/2022 Plan  routine monitors, GETA, will supplement steroids intraop)      Anesthesia Quick Evaluation

## 2022-01-16 ENCOUNTER — Ambulatory Visit (INDEPENDENT_AMBULATORY_CARE_PROVIDER_SITE_OTHER): Payer: Medicare HMO

## 2022-01-16 DIAGNOSIS — Z7901 Long term (current) use of anticoagulants: Secondary | ICD-10-CM | POA: Diagnosis not present

## 2022-01-16 LAB — POCT INR: INR: 2 (ref 2.0–3.0)

## 2022-01-16 NOTE — Progress Notes (Addendum)
Pt is having partial nephrectomy on 7/18 and will be holding his coumadin. He reports the surgeon has told him he will likely stay one day after the surgery in the hospital.   Take 1 tablet daily except take 1/2 tablet on Tuesdays and Saturdays until scheduled below starts. Follow surgeon instructions after surgery if those instructions vary from below. Contact coumadin clinic the day you are discharged home for any further dosing instructions.  01/17/22: Take last dose of coumadin 01/18/22: No coumadin 01/19/22: No coumadin 01/20/22: No coumadin 01/21/22: No coumadin   01/22/22: SURGERY; NO COUMADIN   01/23/22: Take 1 1/2 tablets (7.5 mg) coumadin 01/24/22: Take 1 1/2 tablets (7.5 mg) coumadin 01/25/22: Take 1 1/2 tablets (7.5 mg) coumadin 01/26/22: Take 1 tablet (5 mg) coumadin 01/27/22: Take 1 tablet (5 mg) coumadin 01/28/22: Take 1 tablet (5 mg) coumadin 01/29/22: Take 1/2 tablet (2.5 mg) coumadin 01/30/22: Recheck INR; do not take coumadin until after INR is checked

## 2022-01-16 NOTE — Telephone Encounter (Signed)
Noted. Pt will be advised of dosing today at apt.

## 2022-01-16 NOTE — Patient Instructions (Addendum)
Pre visit review using our clinic review tool, if applicable. No additional management support is needed unless otherwise documented below in the visit note.  Take 1 tablet daily except take 1/2 tablet on Tuesdays and Saturdays until scheduled below starts. Follow surgeon instructions after surgery if those instructions vary from below. Contact coumadin clinic the day you are discharged home for any further dosing instructions.  01/17/22: Take last dose of coumadin 01/18/22: No coumadin 01/19/22: No coumadin 01/20/22: No coumadin 01/21/22: No coumadin   01/22/22: SURGERY; NO COUMADIN   01/23/22: Take 1 1/2 tablets (7.5 mg) coumadin 01/24/22: Take 1 1/2 tablets (7.5 mg) coumadin 01/25/22: Take 1 1/2 tablets (7.5 mg) coumadin 01/26/22: Take 1 tablet (5 mg) coumadin 01/27/22: Take 1 tablet (5 mg) coumadin 01/28/22: Take 1 tablet (5 mg) coumadin 01/29/22: Take 1/2 tablet (2.5 mg) coumadin 01/30/22: Recheck INR; do not take coumadin until after INR is checked

## 2022-01-21 NOTE — H&P (Signed)
Office Visit Report     01/15/2022   --------------------------------------------------------------------------------   Daniel Sharp  MRN: [id number]  DOB: 04-13-1933, 88 year old Male  SSN:    PRIMARY CARE:  Daniel Sole, MD  REFERRING:  Daniel Sharp. Daniel Shi, MD  PROVIDER:  Rhoderick Sharp, M.D.  TREATING:  Daniel Sharp, [place]  LOCATION:  [facility] [phone]     --------------------------------------------------------------------------------   CC/HPI: Pt presents today for pre-operative history and physical exam in anticipation of robotic assisted left lap partial nephrectomy Dr. Liliane Sharp on 01/22/22. He is doing well and is without complaint.   Obtained cardiac clearance from Dr. Katrinka Sharp with the ok to hold coumadin 5 days prior to surgery without Lovenox bridge.   Pt denies F/C, HA, CP, SOB, N/V, diarrhea/constipation, back pain, flank pain, hematuria, and dysuria.    HX:   Left renal mass   Daniel Sharp is an 88 year old male with a solid and enhancing 3.9 cm left renal mass with features concerning for renal cell carcinoma. The mass was initially discovered on CT chest/abdomen/pelvis on 06/28/2021 after a fall down a flight of stairs.   08/02/21:  -Patient currently lives in [place], but has plans to move to [place] to live with his son in the coming months  -Injuries following fall: Dislocated right finger, fractured right foot, right fifth rib fracture  -200 cm prostate noted on CT  -hx of elevated PSA s/p negative PNBx x2 in the 2000s  -hx of afib/mitral valve repair--on coumadin--hasn't established with a cardiologist yet   11/02/21: The patient is here today for a routine follow-up. His accompanied by his son today. His recent MRI showed a stable, left renal mass with features concerning for RCC. He has fully recovered from his recent fall. He still hasn't established care with a cardiologist in [place].  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   Tobacco Use Assessment Completed: Used Tobacco in last 30 days? Does not use smokeless tobacco. Does drink.  Does not use drugs. Drinks 2 caffeinated drinks per day.     Notes: Smoked a pipe for 30 years and quit smoking 25 years ago  ETOH scotch"1 jigger" nightly beer or wine with lunch or supper     REVIEW OF SYSTEMS:    GU Review Male:   Patient reports leakage of urine, hard to postpone urination, get up at night to urinate, frequent urination, and trouble starting your stream. Patient denies erection problems, burning/ pain with urination, have to strain to urinate , penile pain, and stream starts and stops.  Gastrointestinal (Upper):   Patient denies nausea, vomiting, and indigestion/ heartburn.  Gastrointestinal (Lower):  Patient denies diarrhea and constipation.  Constitutional:   Patient reports fatigue. Patient denies fever, night sweats, and weight loss.  Skin:   Patient denies skin rash/ lesion and itching.  Eyes:   Patient denies blurred vision and double vision.  Ears/ Nose/ Throat:   Patient denies sore throat and sinus problems.  Hematologic/Lymphatic:   Patient denies swollen glands and easy bruising.  Cardiovascular:    Patient reports leg swelling. Patient denies chest pains.  Respiratory:   Patient reports shortness of breath. Patient denies cough.  Endocrine:   Patient denies excessive thirst.  Musculoskeletal:   Patient reports joint pain. Patient denies back pain.  Neurological:   Patient denies headaches.  Psychologic:   Patient denies depression and anxiety.   VITAL SIGNS:      01/15/2022 01:20 PM  BP 177/78 mmHg  Pulse 59 /min  Temperature 97.1 F / 36.1 C   MULTI-SYSTEM PHYSICAL EXAMINATION:    Constitutional: Well-nourished. No physical deformities. Normally developed. Good grooming. mutitple bruises and skin tears on arms  Neck: Neck symmetrical, not swollen. Normal tracheal position.  Respiratory: Normal breath sounds. No labored breathing, no use of accessory muscles.   Cardiovascular: Regular rate and rhythm. No murmur, no gallop.   Lymphatic: No enlargement of neck, axillae, groin.  Skin: No paleness, no jaundice, no cyanosis. No lesion, no ulcer, no rash.  Neurologic / Psychiatric: Oriented to time, oriented to place, oriented to person. No depression, no anxiety, no agitation.  Gastrointestinal: No mass, no tenderness, no rigidity, non obese abdomen.  Eyes: Normal conjunctivae. Normal eyelids.  Ears, Nose, Mouth, and Throat: Left ear no scars, no lesions, no masses. Right ear no scars, no lesions, no masses. Nose no scars, no lesions, no masses. Normal hearing. Normal lips.  Musculoskeletal: Normal gait and station of head and neck. Uses cane     Complexity of Data:  Records Review:   Previous Patient Records  Urine Test Review:   Urinalysis   01/15/22  Urinalysis  Urine Appearance Clear   Urine Color Yellow   Urine Glucose Neg mg/dL  Urine Bilirubin Neg mg/dL  Urine Ketones Neg mg/dL  Urine Specific Gravity 1.015   Urine Blood Neg ery/uL  Urine pH 6.0   Urine Protein Trace mg/dL  Urine Urobilinogen 0.2 mg/dL  Urine Nitrites Neg   Urine Leukocyte Esterase Neg leu/uL    PROCEDURES:          Urinalysis - 81003 Dipstick Dipstick Cont'd  Color: Yellow Bilirubin: Neg mg/dL  Appearance: Clear Ketones: Neg mg/dL  Specific Gravity: 1.610 Blood: Neg ery/uL  pH: 6.0 Protein: Trace mg/dL  Glucose: Neg mg/dL Urobilinogen: 0.2 mg/dL    Nitrites: Neg    Leukocyte Esterase: Neg leu/uL    ASSESSMENT:      ICD-10 Details  1 GU:   Left renal neoplasm - R60.454    PLAN:   -I personally reviewed imaging results and films with the patient. We discussed that the mass in question has features concerning for malignancy. I explained the natural history of presumed renal cell carcinoma. I reviewed the AUA guidelines for evaluation and treatment of the small renal mass. The options of active surveillance, in situ tumor ablation, partial and radical nephrectomy was discussed. The risks of robotic LEFT partial nephrectomy were discussed in detail including but not limited to: negative pathology, open conversion, completion nephrectomy, infection of the urinary tract/skin/abdominal cavity, VTE, MI/CVA, lymphatic leak, injury to adjacent solid/hollow viscus organs, bleeding requiring a blood transfusion, catastrophic bleeding,  hernia formation, need for postoperative angioembolization, urinary leak requiring stent/drain, and other imponderables.          Schedule Return Visit/Planned Activity: Keep Scheduled Appointment - Schedule Surgery          Document Letter(s):  Created for Patient: Clinical Summary

## 2022-01-22 ENCOUNTER — Encounter (HOSPITAL_COMMUNITY): Payer: Self-pay | Admitting: Urology

## 2022-01-22 ENCOUNTER — Encounter (HOSPITAL_COMMUNITY)
Admission: RE | Disposition: A | Discharge status: Home / Self Care | Payer: Self-pay | Source: Home / Self Care | Attending: Urology

## 2022-01-22 ENCOUNTER — Other Ambulatory Visit: Payer: Self-pay

## 2022-01-22 ENCOUNTER — Ambulatory Visit (HOSPITAL_COMMUNITY): Payer: Medicare HMO | Admitting: Physician Assistant

## 2022-01-22 ENCOUNTER — Ambulatory Visit (HOSPITAL_COMMUNITY): Payer: Medicare HMO | Admitting: Anesthesiology

## 2022-01-22 ENCOUNTER — Inpatient Hospital Stay (HOSPITAL_COMMUNITY)
Admission: RE | Admit: 2022-01-22 | Discharge: 2022-01-23 | DRG: 658 | Disposition: A | Discharge status: Home / Self Care | Payer: Medicare HMO | Attending: Urology | Admitting: Urology

## 2022-01-22 DIAGNOSIS — C642 Malignant neoplasm of left kidney, except renal pelvis: Principal | ICD-10-CM | POA: Diagnosis present

## 2022-01-22 DIAGNOSIS — G7000 Myasthenia gravis without (acute) exacerbation: Secondary | ICD-10-CM | POA: Diagnosis present

## 2022-01-22 DIAGNOSIS — Z888 Allergy status to other drugs, medicaments and biological substances status: Secondary | ICD-10-CM

## 2022-01-22 DIAGNOSIS — I10 Essential (primary) hypertension: Secondary | ICD-10-CM

## 2022-01-22 DIAGNOSIS — Z7952 Long term (current) use of systemic steroids: Secondary | ICD-10-CM

## 2022-01-22 DIAGNOSIS — Z87891 Personal history of nicotine dependence: Secondary | ICD-10-CM | POA: Diagnosis not present

## 2022-01-22 DIAGNOSIS — I4891 Unspecified atrial fibrillation: Secondary | ICD-10-CM

## 2022-01-22 DIAGNOSIS — Z952 Presence of prosthetic heart valve: Secondary | ICD-10-CM

## 2022-01-22 DIAGNOSIS — N2889 Other specified disorders of kidney and ureter: Secondary | ICD-10-CM | POA: Diagnosis not present

## 2022-01-22 DIAGNOSIS — Z7901 Long term (current) use of anticoagulants: Secondary | ICD-10-CM

## 2022-01-22 DIAGNOSIS — Z9049 Acquired absence of other specified parts of digestive tract: Secondary | ICD-10-CM

## 2022-01-22 HISTORY — PX: ROBOTIC ASSITED PARTIAL NEPHRECTOMY: SHX6087

## 2022-01-22 LAB — HEMOGLOBIN AND HEMATOCRIT, BLOOD
HCT: 30.9 % — ABNORMAL LOW (ref 39.0–52.0)
Hemoglobin: 10 g/dL — ABNORMAL LOW (ref 13.0–17.0)

## 2022-01-22 LAB — CREATININE, SERUM
Creatinine, Ser: 1.27 mg/dL — ABNORMAL HIGH (ref 0.61–1.24)
GFR, Estimated: 55 mL/min — ABNORMAL LOW (ref >60)

## 2022-01-22 LAB — CBC
HCT: 31.2 % — ABNORMAL LOW (ref 39.0–52.0)
Hemoglobin: 10.2 g/dL — ABNORMAL LOW (ref 13.0–17.0)
MCH: 32.4 pg (ref 26.0–34.0)
MCHC: 32.7 g/dL (ref 30.0–36.0)
MCV: 99 fL (ref 80.0–100.0)
Platelets: 150 10*3/uL (ref 150–400)
RBC: 3.15 MIL/uL — ABNORMAL LOW (ref 4.22–5.81)
RDW: 14.6 % (ref 11.5–15.5)
WBC: 10 10*3/uL (ref 4.0–10.5)
nRBC: 0 % (ref 0.0–0.2)

## 2022-01-22 LAB — PROTIME-INR
INR: 1.2 (ref 0.8–1.2)
Prothrombin Time: 15.2 seconds (ref 11.4–15.2)

## 2022-01-22 LAB — APTT: aPTT: 29 seconds (ref 24–36)

## 2022-01-22 LAB — ABO/RH: ABO/RH(D): A POS

## 2022-01-22 LAB — PREPARE RBC (CROSSMATCH)

## 2022-01-22 SURGERY — NEPHRECTOMY, PARTIAL, ROBOT-ASSISTED
Anesthesia: General | Laterality: Left

## 2022-01-22 MED ORDER — SODIUM CHLORIDE 0.45 % IV SOLN: INTRAVENOUS | Status: DC

## 2022-01-22 MED ORDER — ATENOLOL 50 MG PO TABS
25.0000 mg | ORAL_TABLET | Freq: Two times a day (BID) | ORAL | Status: DC
  Filled 2022-01-22 (×2): qty 1

## 2022-01-22 MED ORDER — SODIUM CHLORIDE 0.9 % IV SOLN: 10.0000 mL/h | Freq: Once | INTRAVENOUS | Status: AC

## 2022-01-22 MED ORDER — SUGAMMADEX SODIUM 500 MG/5ML IV SOLN
INTRAVENOUS | Status: DC | PRN
  Administered 2022-01-22: 400 mg via INTRAVENOUS

## 2022-01-22 MED ORDER — FENTANYL CITRATE (PF) 100 MCG/2ML IJ SOLN
INTRAMUSCULAR | Status: DC | PRN
  Administered 2022-01-22 (×5): 50 ug via INTRAVENOUS

## 2022-01-22 MED ORDER — ORAL CARE MOUTH RINSE: 15.0000 mL | OROMUCOSAL | Status: DC | PRN

## 2022-01-22 MED ORDER — CHLORHEXIDINE GLUCONATE 0.12 % MT SOLN
15.0000 mL | Freq: Once | OROMUCOSAL | Status: AC
  Administered 2022-01-22: 15 mL via OROMUCOSAL

## 2022-01-22 MED ORDER — LIDOCAINE HCL (CARDIAC) PF 100 MG/5ML IV SOSY
PREFILLED_SYRINGE | INTRAVENOUS | Status: DC | PRN
  Administered 2022-01-22: 40 mg via INTRAVENOUS

## 2022-01-22 MED ORDER — EPHEDRINE SULFATE (PRESSORS) 50 MG/ML IJ SOLN
INTRAMUSCULAR | Status: DC | PRN
  Administered 2022-01-22: 10 mg via INTRAVENOUS
  Administered 2022-01-22: 5 mg via INTRAVENOUS
  Administered 2022-01-22: 10 mg via INTRAVENOUS

## 2022-01-22 MED ORDER — PROPOFOL 10 MG/ML IV BOLUS
INTRAVENOUS | Status: DC | PRN
  Administered 2022-01-22: 100 mg via INTRAVENOUS
  Administered 2022-01-22: 50 mg via INTRAVENOUS
  Administered 2022-01-22: 40 mg via INTRAVENOUS

## 2022-01-22 MED ORDER — PROMETHAZINE HCL 25 MG/ML IJ SOLN: 6.2500 mg | INTRAMUSCULAR | Status: DC | PRN

## 2022-01-22 MED ORDER — ROCURONIUM BROMIDE 100 MG/10ML IV SOLN
INTRAVENOUS | Status: DC | PRN
  Administered 2022-01-22: 50 mg via INTRAVENOUS
  Administered 2022-01-22: 30 mg via INTRAVENOUS
  Administered 2022-01-22: 20 mg via INTRAVENOUS
  Administered 2022-01-22: 30 mg via INTRAVENOUS

## 2022-01-22 MED ORDER — OXYCODONE HCL 5 MG PO TABS
5.0000 mg | ORAL_TABLET | Freq: Once | ORAL | Status: AC | PRN
  Administered 2022-01-22: 5 mg via ORAL

## 2022-01-22 MED ORDER — DEXAMETHASONE SODIUM PHOSPHATE 10 MG/ML IJ SOLN
INTRAMUSCULAR | Status: DC | PRN
  Administered 2022-01-22: 10 mg via INTRAVENOUS

## 2022-01-22 MED ORDER — LACTATED RINGERS IV SOLN: INTRAVENOUS | Status: DC

## 2022-01-22 MED ORDER — ALBUMIN HUMAN 5 % IV SOLN: INTRAVENOUS | Status: DC | PRN

## 2022-01-22 MED ORDER — DIPHENHYDRAMINE HCL 12.5 MG/5ML PO ELIX: 12.5000 mg | ORAL_SOLUTION | Freq: Four times a day (QID) | ORAL | Status: DC | PRN

## 2022-01-22 MED ORDER — OXYCODONE HCL 5 MG PO TABS
ORAL_TABLET | ORAL | Status: AC
  Filled 2022-01-22: qty 1

## 2022-01-22 MED ORDER — LACTATED RINGERS IR SOLN
Status: DC | PRN
  Administered 2022-01-22: 1000 mL

## 2022-01-22 MED ORDER — PROMETHAZINE HCL 12.5 MG PO TABS: 12.5000 mg | ORAL_TABLET | ORAL | 0 refills | Status: DC | PRN

## 2022-01-22 MED ORDER — PROPOFOL 10 MG/ML IV BOLUS
INTRAVENOUS | Status: AC
  Filled 2022-01-22: qty 20

## 2022-01-22 MED ORDER — PHENYLEPHRINE HCL (PRESSORS) 10 MG/ML IV SOLN
INTRAVENOUS | Status: AC
  Filled 2022-01-22: qty 1

## 2022-01-22 MED ORDER — 0.9 % SODIUM CHLORIDE (POUR BTL) OPTIME
TOPICAL | Status: DC | PRN
  Administered 2022-01-22: 1000 mL

## 2022-01-22 MED ORDER — BUPIVACAINE LIPOSOME 1.3 % IJ SUSP
INTRAMUSCULAR | Status: AC
  Filled 2022-01-22: qty 20

## 2022-01-22 MED ORDER — DOCUSATE SODIUM 100 MG PO CAPS: 100.0000 mg | ORAL_CAPSULE | Freq: Two times a day (BID) | ORAL | Status: AC

## 2022-01-22 MED ORDER — DOCUSATE SODIUM 100 MG PO CAPS
100.0000 mg | ORAL_CAPSULE | Freq: Two times a day (BID) | ORAL | Status: DC
  Administered 2022-01-22 – 2022-01-23 (×2): 100 mg via ORAL
  Filled 2022-01-22 (×2): qty 1

## 2022-01-22 MED ORDER — MIDAZOLAM HCL 2 MG/2ML IJ SOLN: 0.5000 mg | Freq: Once | INTRAMUSCULAR | Status: DC | PRN

## 2022-01-22 MED ORDER — OXYCODONE HCL 5 MG/5ML PO SOLN: 5.0000 mg | Freq: Once | ORAL | Status: AC | PRN

## 2022-01-22 MED ORDER — METHYLPREDNISOLONE SODIUM SUCC 125 MG IJ SOLR
INTRAMUSCULAR | Status: DC | PRN
  Administered 2022-01-22: 125 mg via INTRAVENOUS

## 2022-01-22 MED ORDER — ONDANSETRON HCL 4 MG/2ML IJ SOLN
INTRAMUSCULAR | Status: DC | PRN
  Administered 2022-01-22: 4 mg via INTRAVENOUS

## 2022-01-22 MED ORDER — DIPHENHYDRAMINE HCL 50 MG/ML IJ SOLN: 12.5000 mg | Freq: Four times a day (QID) | INTRAMUSCULAR | Status: DC | PRN

## 2022-01-22 MED ORDER — HYOSCYAMINE SULFATE 0.125 MG SL SUBL: 0.1250 mg | SUBLINGUAL_TABLET | SUBLINGUAL | Status: DC | PRN

## 2022-01-22 MED ORDER — ORAL CARE MOUTH RINSE: 15.0000 mL | Freq: Once | OROMUCOSAL | Status: AC

## 2022-01-22 MED ORDER — PREDNISONE 5 MG PO TABS
5.0000 mg | ORAL_TABLET | Freq: Every day | ORAL | Status: DC
  Administered 2022-01-23: 5 mg via ORAL
  Filled 2022-01-22: qty 1

## 2022-01-22 MED ORDER — BUPIVACAINE LIPOSOME 1.3 % IJ SUSP
INTRAMUSCULAR | Status: DC | PRN
  Administered 2022-01-22: 20 mL

## 2022-01-22 MED ORDER — SODIUM CHLORIDE (PF) 0.9 % IJ SOLN
INTRAMUSCULAR | Status: AC
  Filled 2022-01-22: qty 20

## 2022-01-22 MED ORDER — GLYCOPYRROLATE 0.2 MG/ML IJ SOLN
INTRAMUSCULAR | Status: DC | PRN
  Administered 2022-01-22: .2 mg via INTRAVENOUS

## 2022-01-22 MED ORDER — ACETAMINOPHEN 500 MG PO TABS
1000.0000 mg | ORAL_TABLET | Freq: Four times a day (QID) | ORAL | Status: AC
  Administered 2022-01-22 – 2022-01-23 (×4): 1000 mg via ORAL
  Filled 2022-01-22 (×4): qty 2

## 2022-01-22 MED ORDER — SODIUM CHLORIDE (PF) 0.9 % IJ SOLN
INTRAMUSCULAR | Status: DC | PRN
  Administered 2022-01-22: 20 mL

## 2022-01-22 MED ORDER — FENTANYL CITRATE PF 50 MCG/ML IJ SOSY
PREFILLED_SYRINGE | INTRAMUSCULAR | Status: AC
  Filled 2022-01-22: qty 1

## 2022-01-22 MED ORDER — HEMOSTATIC AGENTS (NO CHARGE) OPTIME
TOPICAL | Status: DC | PRN
  Administered 2022-01-22: 1 via TOPICAL

## 2022-01-22 MED ORDER — HYDROMORPHONE HCL 1 MG/ML IJ SOLN: 0.5000 mg | INTRAMUSCULAR | Status: DC | PRN

## 2022-01-22 MED ORDER — CEFAZOLIN SODIUM-DEXTROSE 2-4 GM/100ML-% IV SOLN
2.0000 g | Freq: Once | INTRAVENOUS | Status: AC
  Administered 2022-01-22: 2 g via INTRAVENOUS
  Filled 2022-01-22: qty 100

## 2022-01-22 MED ORDER — HYDROCODONE-ACETAMINOPHEN 5-325 MG PO TABS: 1.0000 | ORAL_TABLET | Freq: Four times a day (QID) | ORAL | 0 refills | Status: DC | PRN

## 2022-01-22 MED ORDER — FENTANYL CITRATE PF 50 MCG/ML IJ SOSY
25.0000 ug | PREFILLED_SYRINGE | INTRAMUSCULAR | Status: DC | PRN
  Administered 2022-01-22 (×2): 25 ug via INTRAVENOUS

## 2022-01-22 MED ORDER — LACTATED RINGERS IV SOLN: INTRAVENOUS | Status: DC | PRN

## 2022-01-22 MED ORDER — ONDANSETRON HCL 4 MG/2ML IJ SOLN: 4.0000 mg | INTRAMUSCULAR | Status: DC | PRN

## 2022-01-22 MED ORDER — TRIPLE ANTIBIOTIC 3.5-400-5000 EX OINT: 1.0000 | TOPICAL_OINTMENT | Freq: Three times a day (TID) | CUTANEOUS | Status: DC | PRN

## 2022-01-22 MED ORDER — HEPARIN SODIUM (PORCINE) 5000 UNIT/ML IJ SOLN
5000.0000 [IU] | Freq: Three times a day (TID) | INTRAMUSCULAR | Status: DC
  Administered 2022-01-23 (×2): 5000 [IU] via SUBCUTANEOUS
  Filled 2022-01-22 (×2): qty 1

## 2022-01-22 MED ORDER — PHENYLEPHRINE HCL-NACL 20-0.9 MG/250ML-% IV SOLN
INTRAVENOUS | Status: DC | PRN
  Administered 2022-01-22: 30 ug/min via INTRAVENOUS

## 2022-01-22 MED ORDER — FENTANYL CITRATE (PF) 250 MCG/5ML IJ SOLN
INTRAMUSCULAR | Status: AC
  Filled 2022-01-22: qty 5

## 2022-01-22 MED ORDER — OXYCODONE HCL 5 MG PO TABS: 5.0000 mg | ORAL_TABLET | ORAL | Status: DC | PRN

## 2022-01-22 MED ORDER — MEPERIDINE HCL 50 MG/ML IJ SOLN: 6.2500 mg | INTRAMUSCULAR | Status: DC | PRN

## 2022-01-22 MED ORDER — METHYLPREDNISOLONE SODIUM SUCC 125 MG IJ SOLR
INTRAMUSCULAR | Status: AC
  Filled 2022-01-22: qty 2

## 2022-01-22 MED ORDER — ACETAMINOPHEN 500 MG PO TABS
1000.0000 mg | ORAL_TABLET | Freq: Once | ORAL | Status: AC
Start: 2022-01-22 — End: 2022-01-22
  Administered 2022-01-22: 1000 mg via ORAL
  Filled 2022-01-22: qty 2

## 2022-01-22 SURGICAL SUPPLY — 80 items
APPLICATOR VISTASEAL 35 (MISCELLANEOUS) ×1 IMPLANT
BAG COUNTER SPONGE SURGICOUNT (BAG) IMPLANT
BAG LAPAROSCOPIC 12 15 PORT 16 (BASKET) IMPLANT
BAG RETRIEVAL 12/15 (BASKET) ×2
CHLORAPREP W/TINT 26 (MISCELLANEOUS) ×2 IMPLANT
CLIP LIGATING HEM O LOK PURPLE (MISCELLANEOUS) ×3 IMPLANT
CLIP LIGATING HEMO LOK XL GOLD (MISCELLANEOUS) IMPLANT
CLIP LIGATING HEMO O LOK GREEN (MISCELLANEOUS) ×4 IMPLANT
CLIP SUT LAPRA TY ABSORB (SUTURE) ×4 IMPLANT
COVER SURGICAL LIGHT HANDLE (MISCELLANEOUS) ×2 IMPLANT
COVER TIP SHEARS 8 DVNC (MISCELLANEOUS) ×1 IMPLANT
COVER TIP SHEARS 8MM DA VINCI (MISCELLANEOUS) ×1
CUTTER ECHEON FLEX ENDO 45 340 (ENDOMECHANICALS) ×1 IMPLANT
DERMABOND ADVANCED (GAUZE/BANDAGES/DRESSINGS) ×1
DERMABOND ADVANCED .7 DNX12 (GAUZE/BANDAGES/DRESSINGS) ×1 IMPLANT
DRAIN CHANNEL 15F RND FF 3/16 (WOUND CARE) IMPLANT
DRAPE ARM DVNC X/XI (DISPOSABLE) ×4 IMPLANT
DRAPE COLUMN DVNC XI (DISPOSABLE) ×1 IMPLANT
DRAPE DA VINCI XI ARM (DISPOSABLE) ×4
DRAPE DA VINCI XI COLUMN (DISPOSABLE) ×1
DRAPE INCISE IOBAN 66X45 STRL (DRAPES) ×2 IMPLANT
DRAPE SHEET LG 3/4 BI-LAMINATE (DRAPES) ×2 IMPLANT
DRSG TEGADERM 4X4.75 (GAUZE/BANDAGES/DRESSINGS) ×1 IMPLANT
ELECT PENCIL ROCKER SW 15FT (MISCELLANEOUS) ×2 IMPLANT
ELECT REM PT RETURN 15FT ADLT (MISCELLANEOUS) ×2 IMPLANT
EVACUATOR SILICONE 100CC (DRAIN) IMPLANT
GAUZE 4X4 16PLY ~~LOC~~+RFID DBL (SPONGE) ×2 IMPLANT
GLOVE BIO SURGEON STRL SZ 6.5 (GLOVE) ×2 IMPLANT
GLOVE BIOGEL PI IND STRL 8 (GLOVE) ×2 IMPLANT
GLOVE BIOGEL PI INDICATOR 8 (GLOVE) ×2
GLOVE SURG LX 7.5 STRW (GLOVE) ×2
GLOVE SURG LX STRL 7.5 STRW (GLOVE) ×2 IMPLANT
GOWN STRL REUS W/ TWL LRG LVL3 (GOWN DISPOSABLE) ×1 IMPLANT
GOWN STRL REUS W/ TWL XL LVL3 (GOWN DISPOSABLE) ×2 IMPLANT
GOWN STRL REUS W/TWL LRG LVL3 (GOWN DISPOSABLE) ×1
GOWN STRL REUS W/TWL XL LVL3 (GOWN DISPOSABLE) ×2
HEMOSTAT SURGICEL 4X8 (HEMOSTASIS) ×2 IMPLANT
HOLDER FOLEY CATH W/STRAP (MISCELLANEOUS) ×2 IMPLANT
IRRIG SUCT STRYKERFLOW 2 WTIP (MISCELLANEOUS) ×2
IRRIGATION SUCT STRKRFLW 2 WTP (MISCELLANEOUS) ×1 IMPLANT
KIT BASIN OR (CUSTOM PROCEDURE TRAY) ×2 IMPLANT
KIT TURNOVER KIT A (KITS) IMPLANT
LOOP VESSEL MAXI BLUE (MISCELLANEOUS) IMPLANT
MARKER SKIN DUAL TIP RULER LAB (MISCELLANEOUS) ×2 IMPLANT
NDL INSUFFLATION 14GA 120MM (NEEDLE) ×1 IMPLANT
NEEDLE INSUFFLATION 14GA 120MM (NEEDLE) ×2 IMPLANT
PROTECTOR NERVE ULNAR (MISCELLANEOUS) ×4 IMPLANT
RELOAD STAPLE 45 2.6 WHT THIN (STAPLE) IMPLANT
SCISSORS LAP 5X45 EPIX DISP (ENDOMECHANICALS) ×2 IMPLANT
SEAL CANN UNIV 5-8 DVNC XI (MISCELLANEOUS) ×4 IMPLANT
SEAL XI 5MM-8MM UNIVERSAL (MISCELLANEOUS) ×4
SEALANT SURGICAL APPL DUAL CAN (MISCELLANEOUS) IMPLANT
SET TUBE SMOKE EVAC HIGH FLOW (TUBING) ×2 IMPLANT
SOLUTION ELECTROLUBE (MISCELLANEOUS) ×2 IMPLANT
SPIKE FLUID TRANSFER (MISCELLANEOUS) ×2 IMPLANT
STAPLE RELOAD 45 WHT (STAPLE) ×4 IMPLANT
STAPLE RELOAD 45MM WHITE (STAPLE) ×4
SUT ETHILON 2 0 PSLX (SUTURE) IMPLANT
SUT MNCRL AB 4-0 PS2 18 (SUTURE) ×4 IMPLANT
SUT PDS AB 0 CT1 36 (SUTURE) ×2 IMPLANT
SUT V-LOC BARB 180 2/0GR6 GS22 (SUTURE)
SUT VIC AB 0 CT1 36 (SUTURE) ×1 IMPLANT
SUT VIC AB 1 CT1 36 (SUTURE) ×9 IMPLANT
SUT VIC AB 4-0 SH 27 (SUTURE) ×3
SUT VIC AB 4-0 SH 27XBRD (SUTURE) IMPLANT
SUT VICRYL 0 UR6 27IN ABS (SUTURE) IMPLANT
SUT VLOC BARB 180 ABS3/0GR12 (SUTURE) ×6
SUTURE V-LC BRB 180 2/0GR6GS22 (SUTURE) IMPLANT
SUTURE VLOC BRB 180 ABS3/0GR12 (SUTURE) ×2 IMPLANT
SYS BAG RETRIEVAL 10MM (BASKET) ×2
SYSTEM BAG RETRIEVAL 10MM (BASKET) ×1 IMPLANT
TOWEL OR 17X26 10 PK STRL BLUE (TOWEL DISPOSABLE) ×2 IMPLANT
TOWEL OR NON WOVEN STRL DISP B (DISPOSABLE) ×2 IMPLANT
TRAY FOLEY MTR SLVR 16FR STAT (SET/KITS/TRAYS/PACK) ×2 IMPLANT
TRAY LAPAROSCOPIC (CUSTOM PROCEDURE TRAY) ×2 IMPLANT
TROCAR ADV FIXATION 12X100MM (TROCAR) ×2 IMPLANT
TROCAR UNIVERSAL OPT 12M 100M (ENDOMECHANICALS) IMPLANT
TROCAR Z THREAD OPTICAL 12X100 (TROCAR) ×1 IMPLANT
TROCAR Z-THREAD OPTICAL 5X100M (TROCAR) IMPLANT
WATER STERILE IRR 1000ML POUR (IV SOLUTION) ×2 IMPLANT

## 2022-01-22 NOTE — Discharge Instructions (Signed)
Activity:  You are encouraged to ambulate frequently (about every hour during waking hours) to help prevent blood clots from forming in your legs or lungs.  However, you should not engage in any heavy lifting (> 10-15 lbs), strenuous activity, or straining. Diet: You should advance your diet as instructed by your physician.  It will be normal to have some bloating, nausea, and abdominal discomfort intermittently. Prescriptions:  You will be provided a prescription for pain medication to take as needed.  If your pain is not severe enough to require the prescription pain medication, you may take extra strength Tylenol instead which will have less side effects.  You should also take a prescribed stool softener to avoid straining with bowel movements as the prescription pain medication may constipate you. Incisions: You may remove your dressing bandages 48 hours after surgery if not removed in the hospital.  You will either have some small staples or special tissue glue at each of the incision sites. Once the bandages are removed (if present), the incisions may stay open to air.  You may start showering (but not soaking or bathing in water) the 2nd day after surgery and the incisions simply need to be patted dry after the shower.  No additional care is needed. What to call us about: You should call the office [phone]) if you develop fever > 101 or develop persistent vomiting.   You may resume aspirin, advil, aleve, vitamins, and supplements 7 days after surgery.  For your warfarin, you may restart this medication on 01/24/22. Please take per the following regimen as recommended by the cardiologist: 01/24/22: Take 1 1/2 tablets (7.5 mg) coumadin 01/25/22: Take 1 1/2 tablets (7.5 mg) coumadin 01/26/22: Take 1 1/2 tablets (7.5 mg) coumadin 01/27/22: Take 1 tablet (5 mg) coumadin 01/28/22: Take 1 tablet (5 mg) coumadin 01/29/22: Take 1 tablet (5 mg) coumadin 01/30/22: Take 1/2 tablet (2.5 mg)  coumadin 01/31/22: Recheck INR; do not take coumadin until after INR is checked

## 2022-01-22 NOTE — Progress Notes (Signed)
Pacu RN Report to floor given  Gave report to  [employer]. Room: 1426 [facility]   Discussed surgery, meds given in OR and Pacu, VS, IV fluids given, EBL, urine output, pain and other pertinent information. Also discussed if pt had any family or friends here or belongings with them.   Pt has a foley with 175 output amber colored urine. 4 lapsites w/ dermabond, CDI, no bleeding or hematoma noted.   Pt exits my care.

## 2022-01-22 NOTE — Anesthesia Postprocedure Evaluation (Signed)
Anesthesia Post Note  Patient: Daniel Sharp  Procedure(s) Performed: XI ROBOTIC ASSITED NEPHRECTOMY (Left)     Patient location during evaluation: PACU Anesthesia Type: General Level of consciousness: awake and alert, patient cooperative and oriented Pain management: pain level controlled Vital Signs Assessment: post-procedure vital signs reviewed and stable Respiratory status: spontaneous breathing, nonlabored ventilation, respiratory function stable and patient connected to nasal cannula oxygen Cardiovascular status: blood pressure returned to baseline and stable Postop Assessment: no apparent nausea or vomiting Anesthetic complications: no   No notable events documented.  Last Vitals:  Vitals:   01/22/22 1315 01/22/22 1330  BP: 138/69 (!) 165/65  Pulse: (!) 45 (!) 48  Resp: 12 11  Temp:  (!) 35.7 C  SpO2: 98% 97%    Last Pain:  Vitals:   01/22/22 1330  TempSrc:   PainSc: 5                  Anwar Crill,E. Jamani Eley

## 2022-01-22 NOTE — Anesthesia Procedure Notes (Signed)
Procedure Name: Intubation Date/Time: 01/22/2022 7:30 AM  Performed by: Jonna Munro, CRNAPre-anesthesia Checklist: Patient identified, Emergency Drugs available, Suction available, Patient being monitored and Timeout performed Patient Re-evaluated:Patient Re-evaluated prior to induction Oxygen Delivery Method: Circle system utilized Preoxygenation: Pre-oxygenation with 100% oxygen Induction Type: IV induction Ventilation: Oral airway inserted - appropriate to patient size Laryngoscope Size: Mac and 4 Grade View: Grade II Tube type: Oral Tube size: 7.0 mm Number of attempts: 1 Placement Confirmation: ETT inserted through vocal cords under direct vision, positive ETCO2, breath sounds checked- equal and bilateral and CO2 detector Secured at: 23 cm Tube secured with: Tape Dental Injury: Teeth and Oropharynx as per pre-operative assessment

## 2022-01-22 NOTE — Op Note (Addendum)
Operative Note  Preoperative diagnosis:  1.  3.9 cm left renal mass  Postoperative diagnosis: 1.  3.9 cm left renal mass  Procedure(s): 1.  Robot-assisted laparoscopic left nephrectomy (adrenal sparing 2.  Intraoperative ultrasound of single retroperitoneal organ  Surgeon: Ellison Hughs, MD  Assistants:  1.  Debbrah Alar, [facility] An assistant was required for this surgical procedure.  The duties of the assistant included but were not limited to suctioning, passing suture, camera manipulation, retraction.  This procedure would not be able to be performed without an Environmental consultant.  2.  Hinton Rao, MD PGY4  Anesthesia:  General  Complications:  None  EBL: 600 mL  Specimens: 1.  Left kidney and left renal mass  Drains/Catheters: 1.  16 French Foley catheter  Intraoperative findings:   Excision of the left renal mass, deep parenchymal sutures were utilized in an attempt to repair the renal defect.  When tensioning the sutures, the Hem-o-lok bolsters tore through the renal capsule, raising concern for the overall integrity and viability of the repair.  Due to the patient's multiple medical core morbidities, the decision was made to proceed with left nephrectomy to avoid a catastrophic bleeding complication from the renorrhaphy.  Indication:  Daniel Sharp is a 88 y.o. male with 3.9 cm left renal mass with features concerning for renal cell carcinoma.  He has been consented for the above procedures, voices understanding and wishes to proceed.  Description of procedure:  After informed consent was obtained, the patient was brought to the operating room and general endotracheal anesthesia was administered.  A 16 French Foley catheter was then sterilely placed and set to gravity drainage.  The patient was then placed in the right lateral decubitus position and prepped and draped in usual sterile fashion.  A timeout was performed.  An 8 mm incision was then made lateral to the left rectus  muscle at the level of the left 12th rib.  Abdominal access was obtained via a Veress needle.  The abdominal cavity was then insufflated up to 15 mmHg.  An 8 mm port was then introduced into the abdominal cavity.  Inspection of the port entry site by the robotic camera revealed no adjacent organ injury.  We then placed 3 additional 8 mm robotic ports to triangulate the left renal hilum.  A 12 mm assistant port was then placed between the carmera port and 3rd robotic arm.  The white line of Toldt along the descending colon was incised sharply and the colon, along with its mesocolonic fat, was reflected medially until the aorta was identified.  We then made a small window adjacent to the lower pole of the left kidney, identifying the left psoas muscle, left ureter and left gonadal vein.  The left ureter and gonadal vein were then reflected anteriorly allowing [place] to then incised the perihilar attachments using electrocautery.  We encountered a small lumbar vein adjacent to the insertion of the left gonadal vein into the left renal vein.  This lumbar vein was ligated with hemo-lock clips in 2 places and incised sharply.  This provided [place] excellent exposure to the left renal hilum.  The perilymphatic tissue surrounding the 3 left renal arteries were carefully dissected away, creating a window to place a bulldog clamp later in the procedure.  The anterior portion of Gerota's fascia was incised, allowing reflection the perinephric fat medially and laterally until there was adequate exposure of the lower pole left renal mass.  I immediately noticed the poor tissue integrity of the  renal capsule during this portion of the dissection.  Intraoperative ultrasound confirmed the heterogenous echogenicity of the lesion compared to the remainder of the renal parenchyma and allowed identification of the depth/borders of the mass, which were demarcated using electrocautery along the renal capsule.  We then exposed the left renal  arteries and placed multiple bulldog clamps, marking warm ischemia time.  The left kidney immediately became ischemic and pale in appearance.  The left renal mass was then sharply excised with minimal blood loss.  After the mass was free, it was placed in the left upper quadrant to be retrieved later on during the operation.    The renorrhaphy was then attempted using a series 3-0 V-lock suture in the deep layer of the renal parenchyma.  When tensioning the V-lock suture, the Hem-o-lok clip bolsters tore through the renal capsule.  Due to the lack of tissue integrity throughout the renal capsule, I made the decision to proceed with a nephrectomy to avoid catastrophic bleeding from the renorrhaphy.    A 45 mm endovascular stapler was then used to ligate the left renal arteries and then the left renal vein, achieving excellent hemostasis.  The remaining peri-renal attachments were then excised using a combination of blunt dissection and electrocautery.  The left adrenal gland was spared.  The endovascular stapler was then used to ligate the left gonadal vein and left ureter.  Once the kidney was freely mobile, it and the mass were placed in Endo Catch bag to be be retrieved at the conclusion of the case.  The robot was then de-docked.  A left lower quadrant Gibson incision was then made and the mass was removed within the Endo Catch bag.  The fascia within the midline assistant port was then closed using an interrupted 0 Vicryl suture.  The fascia of the internal and external oblique was then closed using a 0 PDS suture in a running fashion.  The subcutaneous tissue within the [facility] incision was then closed using a running 0 Vicryl suture.  All skin incisions were then closed using 4-0 Monocryl and then dressed with Dermabond.  The patient tolerated the procedure well and was transferred to the postanesthesia in stable condition.    Plan:  Monitor on the floor overnight

## 2022-01-22 NOTE — Transfer of Care (Signed)
Immediate Anesthesia Transfer of Care Note  Patient: Daniel Sharp  Procedure(s) Performed: XI ROBOTIC ASSITED NEPHRECTOMY (Left)  Patient Location: PACU  Anesthesia Type:General  Level of Consciousness: drowsy and patient cooperative  Airway & Oxygen Therapy: Patient Spontanous Breathing and Patient connected to face mask oxygen  Post-op Assessment: Report given to RN, Post -op Vital signs reviewed and stable and Patient moving all extremities X 4  Post vital signs: Reviewed and stable  Last Vitals:  Vitals Value Taken Time  BP    Temp    Pulse    Resp    SpO2      Last Pain:  Vitals:   01/22/22 0609  TempSrc:   PainSc: 0-No pain         Complications: No notable events documented.

## 2022-01-23 ENCOUNTER — Encounter (HOSPITAL_COMMUNITY): Payer: Self-pay | Admitting: Urology

## 2022-01-23 DIAGNOSIS — I10 Essential (primary) hypertension: Secondary | ICD-10-CM | POA: Diagnosis present

## 2022-01-23 DIAGNOSIS — Z952 Presence of prosthetic heart valve: Secondary | ICD-10-CM | POA: Diagnosis not present

## 2022-01-23 DIAGNOSIS — Z7952 Long term (current) use of systemic steroids: Secondary | ICD-10-CM | POA: Diagnosis not present

## 2022-01-23 DIAGNOSIS — Z7901 Long term (current) use of anticoagulants: Secondary | ICD-10-CM | POA: Diagnosis not present

## 2022-01-23 DIAGNOSIS — N2889 Other specified disorders of kidney and ureter: Secondary | ICD-10-CM | POA: Diagnosis present

## 2022-01-23 DIAGNOSIS — Z87891 Personal history of nicotine dependence: Secondary | ICD-10-CM | POA: Diagnosis not present

## 2022-01-23 DIAGNOSIS — Z888 Allergy status to other drugs, medicaments and biological substances status: Secondary | ICD-10-CM | POA: Diagnosis not present

## 2022-01-23 DIAGNOSIS — C642 Malignant neoplasm of left kidney, except renal pelvis: Secondary | ICD-10-CM | POA: Diagnosis present

## 2022-01-23 DIAGNOSIS — G7000 Myasthenia gravis without (acute) exacerbation: Secondary | ICD-10-CM | POA: Diagnosis present

## 2022-01-23 DIAGNOSIS — Z9049 Acquired absence of other specified parts of digestive tract: Secondary | ICD-10-CM | POA: Diagnosis not present

## 2022-01-23 LAB — HEMOGLOBIN AND HEMATOCRIT, BLOOD
HCT: 29.5 % — ABNORMAL LOW (ref 39.0–52.0)
Hemoglobin: 9.6 g/dL — ABNORMAL LOW (ref 13.0–17.0)

## 2022-01-23 LAB — BASIC METABOLIC PANEL
Anion gap: 8 (ref 5–15)
BUN: 26 mg/dL — ABNORMAL HIGH (ref 8–23)
CO2: 27 mmol/L (ref 22–32)
Calcium: 9.3 mg/dL (ref 8.9–10.3)
Chloride: 102 mmol/L (ref 98–111)
Creatinine, Ser: 1.54 mg/dL — ABNORMAL HIGH (ref 0.61–1.24)
GFR, Estimated: 44 mL/min — ABNORMAL LOW (ref >60)
Glucose, Bld: 163 mg/dL — ABNORMAL HIGH (ref 70–99)
Potassium: 4.8 mmol/L (ref 3.5–5.1)
Sodium: 137 mmol/L (ref 135–145)

## 2022-01-23 LAB — POCT I-STAT EG7
Acid-Base Excess: 1 mmol/L (ref 0.0–2.0)
Bicarbonate: 28.7 mmol/L — ABNORMAL HIGH (ref 20.0–28.0)
Calcium, Ion: 1.36 mmol/L (ref 1.15–1.40)
HCT: 28 % — ABNORMAL LOW (ref 39.0–52.0)
Hemoglobin: 9.5 g/dL — ABNORMAL LOW (ref 13.0–17.0)
O2 Saturation: 90 %
Potassium: 3.9 mmol/L (ref 3.5–5.1)
Sodium: 140 mmol/L (ref 135–145)
TCO2: 31 mmol/L (ref 22–32)
pCO2, Ven: 61.8 mmHg — ABNORMAL HIGH (ref 44–60)
pH, Ven: 7.275 (ref 7.25–7.43)
pO2, Ven: 69 mmHg — ABNORMAL HIGH (ref 32–45)

## 2022-01-23 MED ORDER — CHLORHEXIDINE GLUCONATE CLOTH 2 % EX PADS: 6.0000 | MEDICATED_PAD | Freq: Every day | CUTANEOUS | Status: DC

## 2022-01-23 NOTE — Care Management Obs Status (Signed)
[facility] NOTIFICATION   Patient Details  Name: Daniel Sharp MRN: [id number] Date of Birth: 1932-11-18   Medicare Observation Status Notification Given:  Yes    Joaquin Courts, RN 01/23/2022, 10:14 AM

## 2022-01-23 NOTE — Discharge Summary (Signed)
[facility] Discharge Summary  Admit date: 01/22/2022  Discharge date and time: 01/24/22   Discharge to: Home  Discharge Service: Urology  Discharge Attending Physician:  Dr. Ellison Hughs  Discharge  Diagnoses: Renal mass  Secondary Diagnosis: Principal Problem:   Renal mass   OR Procedures: Procedure(s): XI ROBOTIC ASSITED NEPHRECTOMY 01/22/2022   Ancillary Procedures: None   Discharge Day Services: The patient was seen and examined by the Urology team both in the morning and immediately prior to discharge.  Vital signs and laboratory values were stable and within normal limits.  The physical exam was benign and unchanged and all surgical wounds were examined.  Discharge instructions were explained and all questions answered.  Subjective  No acute events overnight. Pain Controlled. No fever or chills.  Objective No data found.  Total I/O In: 4542.4 [P.O.:1100; I.V.:2627.4; Blood:315; IV HFWYOVZCH:885] Out: 2170 [Urine:770; Blood:1400]  General Appearance:        No acute distress Lungs:                       Normal work of breathing on room air Heart:                                Regular rate and rhythm Abdomen:                         Soft, non-tender, non-distended. Incisions clean/dry/intact Extremities:                      Warm and well perfused   Hospital Course:  The patient underwent left robotic radical nephrectomy on 01/22/2022.  The patient tolerated the procedure well, was extubated in the OR, and afterwards was taken to the PACU for routine post-surgical care. When stable the patient was transferred to the floor.   The patient did well postoperatively.  The patient's diet was slowly advanced and at the time of discharge was tolerating a regular diet.  The patient was discharged home 2 Days Post-Op, at which point was tolerating a regular solid diet, was able to void spontaneously, have adequate pain control with P.O. pain medication, and could  ambulate without difficulty. The patient will follow up with [place] for post op check.   He is on warfarin at home for a prior mitral valve replacement. Pre-operatively he was advised to stop his warfarin (no lovenox bridge required) and to restart on the following schedule:  Day 1: Take 1 1/2 tablets (7.5 mg) coumadin 01/24/22: Take 1 1/2 tablets (7.5 mg) coumadin Day 2 Take 1 1/2 tablets (7.5 mg) coumadin Day 3: Take 1 tablet (5 mg) coumadin Day 4: Take 1 tablet (5 mg) coumadin Day 5: Take 1 tablet (5 mg) coumadin Day 6: Take 1/2 tablet (2.5 mg) coumadin Day 7:  Recheck INR; do not take coumadin until after INR is checked  This plan was included on his discharge instructions.  Condition at Discharge: Improved  Discharge Medications:  Allergies as of 01/23/2022       Reactions   Mestinon [pyridostigmine Bromide] Diarrhea        Medication List     STOP taking these medications    B-12 PO   Glucosamine Chondroitin + D3 Tabs       TAKE these medications    atenolol 25 MG tablet Commonly known as: TENORMIN Take 25 mg by  mouth 2 (two) times daily.   benazepril 40 MG tablet Commonly known as: LOTENSIN Take 40 mg by mouth daily.   docusate sodium 100 MG capsule Commonly known as: COLACE Take 1 capsule (100 mg total) by mouth 2 (two) times daily.   furosemide 20 MG tablet Commonly known as: LASIX Take 1 tablet (20 mg total) by mouth daily.   hydrochlorothiazide 25 MG tablet Commonly known as: HYDRODIURIL Take 25 mg by mouth daily.   HYDROcodone-acetaminophen 5-325 MG tablet Commonly known as: NORCO/VICODIN Take 1-2 tablets by mouth every 6 (six) hours as needed for moderate pain or severe pain. What changed:  when to take this reasons to take this   predniSONE 5 MG tablet Commonly known as: DELTASONE Take 5 mg by mouth daily.   promethazine 12.5 MG tablet Commonly known as: PHENERGAN Take 1 tablet (12.5 mg total) by mouth every 4 (four) hours as needed for  nausea or vomiting.   warfarin 5 MG tablet Commonly known as: COUMADIN Take as directed. If you are unsure how to take this medication, talk to your nurse or doctor. Original instructions: Take 2.5-5 mg by mouth daily. Take 2.5 mg by mouth on Tuesday and Friday, take 5 mg by mouth on Monday, Wednesday, Thursday, Saturday and Sunday.               Discharge Care Instructions  (From admission, onward)           Start     Ordered   01/23/22 0000  Discharge wound care:       Comments: 1.  Keep incisions clean and dry 2.  Ok to shower 3.  No swimming for 2 weeks   01/23/22 1843

## 2022-01-23 NOTE — Evaluation (Signed)
Physical Therapy Evaluation Patient Details Name: Daniel Sharp MRN: [id number] DOB: 1933-04-04 Today's Date: 01/23/2022  History of Present Illness  Patient is a 88 y.o. male with 3.9 cm left renal mass with features concerning for renal cell carcinoma now s/p lap Lt neprectomy on 01/22/22. PMH significant for Afib, OA, HTN, MG, back surgery, cholecystectomy, appendectomy, and recent fall with dislocated Rt finger, fractured Rt foot, Rt 5th rib fracture.    Clinical Impression  Daniel Sharp is 88 y.o. male admitted with above HPI and diagnosis. Patient is currently limited by functional impairments below (see PT problem list). Patient is from [place] but is staying with his son in [place] and is modified independent at baseline with RW/rollator. Patient will benefit from continued skilled PT interventions to address impairments and progress independence with mobility, recommending HHPT with constant supervision/assist from family. Acute PT will follow and progress as able.        Recommendations for follow up therapy are one component of a multi-disciplinary discharge planning process, led by the attending physician.  Recommendations may be updated based on patient status, additional functional criteria and insurance authorization.  Follow Up Recommendations Home health PT      Assistance Recommended at Discharge Intermittent Supervision/Assistance  Patient can return home with the following  A little help with walking and/or transfers;A little help with bathing/dressing/bathroom;Assistance with cooking/housework;Assistance with feeding;Direct supervision/assist for medications management;Assist for transportation;Direct supervision/assist for financial management;Help with stairs or ramp for entrance    Equipment Recommendations None recommended by PT  Recommendations for Other Services       Functional Status Assessment Patient has had a recent decline in their functional status and  demonstrates the ability to make significant improvements in function in a reasonable and predictable amount of time.     Precautions / Restrictions Precautions Precautions: Fall Restrictions Weight Bearing Restrictions: No      Mobility  Bed Mobility Overal bed mobility: Needs Assistance Bed Mobility: Supine to Sit     Supine to sit: Min assist, HOB elevated     General bed mobility comments: cues to sequence and use bed rail to assist with raising trunk/pivoting hips to EOB.    Transfers Overall transfer level: Needs assistance Equipment used: Rolling walker (2 wheels) Transfers: Sit to/from Stand Sit to Stand: Min assist, From elevated surface           General transfer comment: cues for hand placement, min assist to complete rise and steady with standing. pt denied dizziness in standing.    Ambulation/Gait Ambulation/Gait assistance: Min assist Gait Distance (Feet): 60 Feet Assistive device: Rolling walker (2 wheels) Gait Pattern/deviations: Step-through pattern, Decreased step length - left, Decreased stride length, Wide base of support Gait velocity: decr     General Gait Details: pt with Lt knee snap/hyperextnesion in stance phase secondary to quad weakness. overall low hip flexion and poor foot clearance due to weakness. Cues and assist needed to keep walker in safe proximity.  Stairs            Wheelchair Mobility    Modified Rankin (Stroke Patients Only)       Balance Overall balance assessment: Needs assistance Sitting-balance support: Feet supported Sitting balance-Leahy Scale: Good     Standing balance support: During functional activity, Bilateral upper extremity supported Standing balance-Leahy Scale: Poor                               Pertinent  Vitals/Pain Pain Assessment Pain Assessment: No/denies pain    Home Living Family/patient expects to be discharged to:: Private residence Living Arrangements:  Alone;Children (Will live with son after surgery)   Type of Home: House Home Access: Stairs to enter Entrance Stairs-Rails: Left;Right (Lt only garage) Entrance Stairs-Number of Steps: 4 at back, 5 in garage   Home Layout: One level;Able to live on main level with bedroom/bathroom Home Equipment: Wheelchair - Publishing copy (2 wheels);Cane - single TEFL teacher (4 wheels) (rollator in [place], [place] in [place].)      Prior Function Prior Level of Function : Independent/Modified Independent                     Hand Dominance   Dominant Hand: Right    Extremity/Trunk Assessment   Upper Extremity Assessment Upper Extremity Assessment: Overall WFL for tasks assessed    Lower Extremity Assessment Lower Extremity Assessment: Generalized weakness    Cervical / Trunk Assessment Cervical / Trunk Assessment: Normal  Communication   Communication: No difficulties  Cognition Arousal/Alertness: Awake/alert Behavior During Therapy: WFL for tasks assessed/performed Overall Cognitive Status: No family/caregiver present to determine baseline cognitive functioning                                          General Comments      Exercises     Assessment/Plan    PT Assessment Patient needs continued PT services  PT Problem List Decreased strength;Decreased activity tolerance;Decreased balance;Decreased mobility;Decreased knowledge of use of DME;Decreased knowledge of precautions;Decreased cognition       PT Treatment Interventions DME instruction;Gait training;Stair training;Therapeutic activities;Functional mobility training;Therapeutic exercise;Neuromuscular re-education;Balance training;Patient/family education    PT Goals (Current goals can be found in the Care Plan section)  Acute Rehab PT Goals Patient Stated Goal: ge tback to [place] to play Bridge with friends PT Goal Formulation: With patient Time For Goal Achievement:  02/06/22 Potential to Achieve Goals: Good    Frequency Min 3X/week     Co-evaluation               AM-PAC PT "6 Clicks" Mobility  Outcome Measure Help needed turning from your back to your side while in a flat bed without using bedrails?: A Little Help needed moving from lying on your back to sitting on the side of a flat bed without using bedrails?: A Little Help needed moving to and from a bed to a chair (including a wheelchair)?: A Little Help needed standing up from a chair using your arms (e.g., wheelchair or bedside chair)?: A Little Help needed to walk in hospital room?: A Little Help needed climbing 3-5 steps with a railing? : A Lot 6 Click Score: 17    End of Session Equipment Utilized During Treatment: Gait belt Activity Tolerance: Patient tolerated treatment well Patient left: in chair;with call bell/phone within reach;with chair alarm set Nurse Communication: Mobility status PT Visit Diagnosis: Muscle weakness (generalized) (M62.81);Difficulty in walking, not elsewhere classified (R26.2);Unsteadiness on feet (R26.81)    Time: 1624-4695 PT Time Calculation (min) (ACUTE ONLY): 34 min   Charges:   PT Evaluation $PT Eval Moderate Complexity: 1 Mod PT Treatments $Gait Training: 8-22 mins        Verner Mould, DPT Acute Rehabilitation Services Office [phone] Pager [phone]  01/23/22 10:39 AM

## 2022-01-23 NOTE — TOC Initial Note (Signed)
Transition of Care [facility]) - Initial/Assessment Note    Patient Details  Name: Daniel Sharp MRN: [id number] Date of Birth: Apr 03, 1933  Transition of Care [facility]) CM/SW Contact:    Joaquin Courts, RN Phone Number: 01/23/2022, 10:15 AM  Clinical Narrative:                 CM reviewed chart and noted patient from [place] but staying locally with his son, has a pcp and is currently on 3L O2 in the hospital. CM spoke with patient who reports he plans to return to [place] after completing his md appointments here, but until then he plans to stay with his son.  CM noted PT/OT evals pending, TOC will follow for possible needs.  Expected Discharge Plan: Home/Self Care Barriers to Discharge: Continued Medical Work up   Patient Goals and CMS Choice Patient states their goals for this hospitalization and ongoing recovery are:: to get better and go home      Expected Discharge Plan and Services Expected Discharge Plan: Home/Self Care       Living arrangements for the past 2 months: Single Family Home                                      Prior Living Arrangements/Services Living arrangements for the past 2 months: Single Family Home Lives with:: Self Patient language and need for interpreter reviewed:: Yes Do you feel safe going back to the place where you live?: Yes      Need for Family Participation in Patient Care: Yes (Comment) Care giver support system in place?: Yes (comment)   Criminal Activity/Legal Involvement Pertinent to Current Situation/Hospitalization: No - Comment as needed  Activities of Daily Living Home Assistive Devices/Equipment: Cane (specify quad or straight), Shower chair without back, Grab bars in shower, Grab bars around toilet ADL Screening (condition at time of admission) Patient's cognitive ability adequate to safely complete daily activities?: Yes Is the patient deaf or have difficulty hearing?: No Does the patient have difficulty seeing, even  when wearing glasses/contacts?: Yes Does the patient have difficulty concentrating, remembering, or making decisions?: No Patient able to express need for assistance with ADLs?: Yes Does the patient have difficulty dressing or bathing?: No Independently performs ADLs?: Yes (appropriate for developmental age) Does the patient have difficulty walking or climbing stairs?: Yes Weakness of Legs: Both Weakness of Arms/Hands: Both  Permission Sought/Granted                  Emotional Assessment Appearance:: Appears stated age     Orientation: : Oriented to Self, Oriented to Place, Oriented to  Time, Oriented to Situation   Psych Involvement: No (comment)  Admission diagnosis:  Renal mass [N28.89] Patient Active Problem List   Diagnosis Date Noted   Renal mass 01/22/2022   H/O mitral valve replacement 07/11/2021   Localized edema 07/11/2021   PCP:  [name], MD Pharmacy:   [place], [place] [address] Phone: [phone] Fax: [phone]  [id number] [place] - [address] [place]  [id number]: [phone]: [phone]    Social Determinants of Health (SDOH) Interventions    Readmission Risk Interventions     No data to display

## 2022-01-23 NOTE — Plan of Care (Signed)

## 2022-01-24 LAB — BPAM RBC
Blood Product Expiration Date: 202307312359
Blood Product Expiration Date: 202307312359
ISSUE DATE / TIME: 202307181101
ISSUE DATE / TIME: 202307181101
Unit Type and Rh: 6200
Unit Type and Rh: 6200

## 2022-01-24 LAB — TYPE AND SCREEN
ABO/RH(D): A POS
Antibody Screen: NEGATIVE
Unit division: 0
Unit division: 0

## 2022-01-24 LAB — SURGICAL PATHOLOGY

## 2022-01-27 ENCOUNTER — Emergency Department (HOSPITAL_COMMUNITY)
Admission: EM | Admit: 2022-01-27 | Discharge: 2022-01-27 | Disposition: A | Discharge status: Home / Self Care | Payer: Medicare HMO | Attending: Emergency Medicine | Admitting: Emergency Medicine

## 2022-01-27 ENCOUNTER — Encounter (HOSPITAL_COMMUNITY): Payer: Self-pay | Admitting: *Deleted

## 2022-01-27 ENCOUNTER — Emergency Department (HOSPITAL_COMMUNITY): Payer: Medicare HMO

## 2022-01-27 ENCOUNTER — Other Ambulatory Visit: Payer: Self-pay

## 2022-01-27 DIAGNOSIS — Z7901 Long term (current) use of anticoagulants: Secondary | ICD-10-CM | POA: Diagnosis not present

## 2022-01-27 DIAGNOSIS — R1900 Intra-abdominal and pelvic swelling, mass and lump, unspecified site: Secondary | ICD-10-CM | POA: Diagnosis present

## 2022-01-27 DIAGNOSIS — R600 Localized edema: Secondary | ICD-10-CM | POA: Diagnosis not present

## 2022-01-27 DIAGNOSIS — D649 Anemia, unspecified: Secondary | ICD-10-CM | POA: Insufficient documentation

## 2022-01-27 DIAGNOSIS — Z9889 Other specified postprocedural states: Secondary | ICD-10-CM

## 2022-01-27 DIAGNOSIS — N99820 Postprocedural hemorrhage and hematoma of a genitourinary system organ or structure following a genitourinary system procedure: Secondary | ICD-10-CM | POA: Diagnosis not present

## 2022-01-27 DIAGNOSIS — Z79899 Other long term (current) drug therapy: Secondary | ICD-10-CM | POA: Diagnosis not present

## 2022-01-27 DIAGNOSIS — Z905 Acquired absence of kidney: Secondary | ICD-10-CM | POA: Diagnosis not present

## 2022-01-27 DIAGNOSIS — R609 Edema, unspecified: Secondary | ICD-10-CM

## 2022-01-27 DIAGNOSIS — R531 Weakness: Secondary | ICD-10-CM | POA: Diagnosis not present

## 2022-01-27 LAB — COMPREHENSIVE METABOLIC PANEL
ALT: 21 U/L (ref 0–44)
AST: 22 U/L (ref 15–41)
Albumin: 3.3 g/dL — ABNORMAL LOW (ref 3.5–5.0)
Alkaline Phosphatase: 56 U/L (ref 38–126)
Anion gap: 7 (ref 5–15)
BUN: 45 mg/dL — ABNORMAL HIGH (ref 8–23)
CO2: 29 mmol/L (ref 22–32)
Calcium: 10.2 mg/dL (ref 8.9–10.3)
Chloride: 102 mmol/L (ref 98–111)
Creatinine, Ser: 1.59 mg/dL — ABNORMAL HIGH (ref 0.61–1.24)
GFR, Estimated: 42 mL/min — ABNORMAL LOW (ref >60)
Glucose, Bld: 100 mg/dL — ABNORMAL HIGH (ref 70–99)
Potassium: 4.3 mmol/L (ref 3.5–5.1)
Sodium: 138 mmol/L (ref 135–145)
Total Bilirubin: 1.2 mg/dL (ref 0.3–1.2)
Total Protein: 6.2 g/dL — ABNORMAL LOW (ref 6.5–8.1)

## 2022-01-27 LAB — CBC
HCT: 30.2 % — ABNORMAL LOW (ref 39.0–52.0)
Hemoglobin: 9.8 g/dL — ABNORMAL LOW (ref 13.0–17.0)
MCH: 32.2 pg (ref 26.0–34.0)
MCHC: 32.5 g/dL (ref 30.0–36.0)
MCV: 99.3 fL (ref 80.0–100.0)
Platelets: 201 10*3/uL (ref 150–400)
RBC: 3.04 MIL/uL — ABNORMAL LOW (ref 4.22–5.81)
RDW: 13.7 % (ref 11.5–15.5)
WBC: 8.5 10*3/uL (ref 4.0–10.5)
nRBC: 0 % (ref 0.0–0.2)

## 2022-01-27 LAB — DIFFERENTIAL
Abs Immature Granulocytes: 0.04 10*3/uL (ref 0.00–0.07)
Basophils Absolute: 0 10*3/uL (ref 0.0–0.1)
Basophils Relative: 1 %
Eosinophils Absolute: 0.3 10*3/uL (ref 0.0–0.5)
Eosinophils Relative: 3 %
Immature Granulocytes: 1 %
Lymphocytes Relative: 18 %
Lymphs Abs: 1.5 10*3/uL (ref 0.7–4.0)
Monocytes Absolute: 0.8 10*3/uL (ref 0.1–1.0)
Monocytes Relative: 9 %
Neutro Abs: 5.9 10*3/uL (ref 1.7–7.7)
Neutrophils Relative %: 68 %

## 2022-01-27 LAB — PROTIME-INR
INR: 1.4 — ABNORMAL HIGH (ref 0.8–1.2)
Prothrombin Time: 17.4 seconds — ABNORMAL HIGH (ref 11.4–15.2)

## 2022-01-27 LAB — I-STAT CHEM 8, ED
BUN: 37 mg/dL — ABNORMAL HIGH (ref 8–23)
Calcium, Ion: 1.31 mmol/L (ref 1.15–1.40)
Chloride: 100 mmol/L (ref 98–111)
Creatinine, Ser: 1.6 mg/dL — ABNORMAL HIGH (ref 0.61–1.24)
Glucose, Bld: 95 mg/dL (ref 70–99)
HCT: 27 % — ABNORMAL LOW (ref 39.0–52.0)
Hemoglobin: 9.2 g/dL — ABNORMAL LOW (ref 13.0–17.0)
Potassium: 4.1 mmol/L (ref 3.5–5.1)
Sodium: 136 mmol/L (ref 135–145)
TCO2: 30 mmol/L (ref 22–32)

## 2022-01-27 LAB — LACTIC ACID, PLASMA: Lactic Acid, Venous: 1 mmol/L (ref 0.5–1.9)

## 2022-01-27 LAB — TROPONIN I (HIGH SENSITIVITY): Troponin I (High Sensitivity): 16 ng/L (ref <18)

## 2022-01-27 LAB — ETHANOL: Alcohol, Ethyl (B): <10 mg/dL (ref <10)

## 2022-01-27 LAB — CBG MONITORING, ED: Glucose-Capillary: 100 mg/dL — ABNORMAL HIGH (ref 70–99)

## 2022-01-27 LAB — APTT: aPTT: 32 seconds (ref 24–36)

## 2022-01-27 MED ORDER — SODIUM CHLORIDE 0.9% FLUSH: 3.0000 mL | Freq: Once | INTRAVENOUS | Status: DC

## 2022-01-27 MED ORDER — FUROSEMIDE 20 MG PO TABS: 20.0000 mg | ORAL_TABLET | Freq: Every day | ORAL | 0 refills | Status: DC

## 2022-01-27 NOTE — ED Triage Notes (Signed)
Patient presents after a nephrectomy on Tuesday.  Patient presents with leakage at the surgical site x 2 hours.  Drainage is light pink and light yellow.  Son states his speech is slightly more slurred than normal.  Stomach also got more distended

## 2022-01-27 NOTE — Discharge Instructions (Signed)
Call on Monday to schedule a follow-up appointment with your urologist.  Take your medications at home.  However if you develop fevers or have increased pain, return back to the ER.

## 2022-01-27 NOTE — ED Provider Notes (Signed)
[facility] DEPT Provider Note   CSN: [id number] Arrival date & time: 01/27/22  1323     History  No chief complaint on file.   Daniel Sharp is a 88 y.o. male.  Patient with history of recent left nephrectomy, presents to the ER complaining of increased abdominal swelling, increased weakness increased edema and discharge from her surgical wounds.  Family states the discoloration and edema in the lower abdominal regions has improved in the last day.  Symptoms ongoing for the past 2 to 3 days.  They tried calling the urologist, had a hard time getting in touch with them and decided come into the ER.  Otherwise denies any new abdominal pains.  Denies any fevers.  Denies any cough or vomiting or diarrhea.  Patient had a small bowel movement 2 days ago.       Home Medications Prior to Admission medications   Medication Sig Start Date End Date Taking? Authorizing Provider  atenolol (TENORMIN) 25 MG tablet Take 25 mg by mouth 2 (two) times daily.   Yes [provider]  benazepril (LOTENSIN) 40 MG tablet Take 40 mg by mouth daily.   Yes [provider]  Cholecalciferol (VITAMIN D3 PO) Take 1 tablet by mouth every other day.   Yes [provider]  docusate sodium (COLACE) 100 MG capsule Take 1 capsule (100 mg total) by mouth 2 (two) times daily. Patient taking differently: Take 100 mg by mouth daily as needed for moderate constipation. 01/22/22  Yes Dancy, Amanda, PA-C  furosemide (LASIX) 20 MG tablet Take 1 tablet (20 mg total) by mouth daily for 5 days. 01/27/22 02/01/22 Yes Luna Fuse, MD  glucosamine-chondroitin 500-400 MG tablet Take 1 tablet by mouth 3 (three) times daily.   Yes [provider]  hydrochlorothiazide (HYDRODIURIL) 25 MG tablet Take 25 mg by mouth daily. 09/03/21  Yes [provider]  HYDROcodone-acetaminophen (NORCO/VICODIN) 5-325 MG tablet Take 1-2 tablets by mouth every 6 (six) hours as needed for  moderate pain or severe pain. 01/22/22  Yes Dancy, Estill Bamberg, PA-C  nicotine polacrilex (COMMIT) 2 MG lozenge Take 2 mg by mouth as needed for smoking cessation.   Yes [provider]  predniSONE (DELTASONE) 5 MG tablet Take 5 mg by mouth daily.   Yes [provider]  warfarin (COUMADIN) 5 MG tablet Take 2.5-5 mg by mouth daily. Take 2.5 mg by mouth on Tuesday and Saturday, take 5 mg by mouth on Monday, Wednesday, Thursday, Friday and Sunday.   Yes [provider]  furosemide (LASIX) 20 MG tablet Take 1 tablet (20 mg total) by mouth daily. Patient not taking: Reported on 01/27/2022 01/04/22   [name], MD  promethazine (PHENERGAN) 12.5 MG tablet Take 1 tablet (12.5 mg total) by mouth every 4 (four) hours as needed for nausea or vomiting. 01/22/22   Debbrah Alar, PA-C      Allergies    Mestinon [pyridostigmine bromide]    Review of Systems   Review of Systems  Constitutional:  Negative for fever.  HENT:  Negative for ear pain and sore throat.   Eyes:  Negative for pain.  Respiratory:  Negative for cough.   Cardiovascular:  Negative for chest pain.  Gastrointestinal:  Positive for abdominal distention. Negative for abdominal pain.  Genitourinary:  Negative for flank pain.  Musculoskeletal:  Negative for back pain.  Skin:  Positive for color change. Negative for rash.  Neurological:  Negative for syncope.  All other systems reviewed  and are negative.   Physical Exam Updated Vital Signs BP (!) 165/70   Pulse (!) 43   Temp 97.8 F (36.6 C) (Oral)   Resp 16   Ht '6\' 1"'$  (1.854 m)   Wt 102.1 kg   SpO2 96%   BMI 29.69 kg/m  Physical Exam Constitutional:      Appearance: He is well-developed.  HENT:     Head: Normocephalic.     Nose: Nose normal.  Eyes:     Extraocular Movements: Extraocular movements intact.  Cardiovascular:     Rate and Rhythm: Normal rate.  Pulmonary:     Effort: Pulmonary effort is normal.  Abdominal:     General: There is  distension.     Tenderness: There is no abdominal tenderness. There is no guarding or rebound.     Comments: Mild to moderate ecchymosis present in the lower abdominal regions bilaterally.  Musculoskeletal:     Right lower leg: Edema present.     Left lower leg: Edema present.  Skin:    Coloration: Skin is not jaundiced.  Neurological:     Mental Status: He is alert. Mental status is at baseline.     ED Results / Procedures / Treatments   Labs (all labs ordered are listed, but only abnormal results are displayed) Labs Reviewed  PROTIME-INR - Abnormal; Notable for the following components:      Result Value   Prothrombin Time 17.4 (*)    INR 1.4 (*)    All other components within normal limits  CBC - Abnormal; Notable for the following components:   RBC 3.04 (*)    Hemoglobin 9.8 (*)    HCT 30.2 (*)    All other components within normal limits  COMPREHENSIVE METABOLIC PANEL - Abnormal; Notable for the following components:   Glucose, Bld 100 (*)    BUN 45 (*)    Creatinine, Ser 1.59 (*)    Total Protein 6.2 (*)    Albumin 3.3 (*)    GFR, Estimated 42 (*)    All other components within normal limits  I-STAT CHEM 8, ED - Abnormal; Notable for the following components:   BUN 37 (*)    Creatinine, Ser 1.60 (*)    Hemoglobin 9.2 (*)    HCT 27.0 (*)    All other components within normal limits  CBG MONITORING, ED - Abnormal; Notable for the following components:   Glucose-Capillary 100 (*)    All other components within normal limits  APTT  DIFFERENTIAL  ETHANOL  LACTIC ACID, PLASMA  LACTIC ACID, PLASMA  TROPONIN I (HIGH SENSITIVITY)    EKG EKG Interpretation  Date/Time:  Sunday January 27 2022 13:42:11 EDT Ventricular Rate:  42 PR Interval:    QRS Duration: 107 QT Interval:  500 QTC Calculation: 418 R Axis:   65 Text Interpretation: Junctional rhythm Ventricular premature complex Borderline repolarization abnormality Confirmed by Thamas Jaegers ([id number]) on 01/27/2022  2:06:08 PM  Radiology CT HEAD WO CONTRAST  Result Date: 01/27/2022 CLINICAL DATA:  Neuro deficit. EXAM: CT HEAD WITHOUT CONTRAST TECHNIQUE: Contiguous axial images were obtained from the base of the skull through the vertex without intravenous contrast. RADIATION DOSE REDUCTION: This exam was performed according to the departmental dose-optimization program which includes automated exposure control, adjustment of the mA and/or kV according to patient size and/or use of iterative reconstruction technique. COMPARISON:  07/05/2021 FINDINGS: Brain: There is no evidence for acute hemorrhage, hydrocephalus, mass lesion, or abnormal extra-axial fluid collection. No definite  CT evidence for acute infarction. Diffuse loss of parenchymal volume is consistent with atrophy. Patchy low attenuation in the deep hemispheric and periventricular white matter is nonspecific, but likely reflects chronic microvascular ischemic demyelination. Small extra-axial lesion measuring about 9 by 3 mm identified in the right frontal region (coronal 26/5 and axial 23/2) demonstrates some calcification. Imaging features compatible with meningioma. This is unchanged in the interval and generates no mass-effect or adjacent edema. Vascular: No hyperdense vessel or unexpected calcification. Skull: No evidence for fracture. No worrisome lytic or sclerotic lesion. Sinuses/Orbits: The visualized paranasal sinuses and mastoid air cells are clear. Visualized portions of the globes and intraorbital fat are unremarkable. Other: None. IMPRESSION: 1. No acute intracranial abnormality. 2. Stable tiny right frontal meningioma without mass-effect or adjacent edema. 3. Atrophy with chronic small vessel ischemic disease. Electronically Signed   By: Misty Stanley M.D.   On: 01/27/2022 14:03    Procedures Procedures    Medications Ordered in ED Medications  sodium chloride flush (NS) 0.9 % injection 3 mL (has no administration in time range)    ED  Course/ Medical Decision Making/ A&P                           Medical Decision Making Risk Prescription drug management.   History from family bedside.  Cardiac monitoring showing junctional rhythm bradycardic rate.  Labs are sent hemoglobin stable at 9.18 white count of 8 chemistry otherwise appears unremarkable unchanged from prior levels.  CT scan of the brain pursued in triage which is unremarkable for acute findings.  Consultation with on-call urology Dr. Sumner Boast, recommending outpatient follow-up in clinic this week.  Agree with low-dose Lasix for his peripheral edema.  Patient advised immediate return if he has fevers pain or any additional concerns, otherwise to keep the wound covered with dressing and to follow-up with urology this week.          Final Clinical Impression(s) / ED Diagnoses Final diagnoses:  Post-operative state  Peripheral edema    Rx / DC Orders ED Discharge Orders          Ordered    furosemide (LASIX) 20 MG tablet  Daily        01/27/22 1542              Luna Fuse, MD 01/27/22 [phone]

## 2022-01-27 NOTE — ED Provider Triage Note (Signed)
Emergency Medicine Provider Triage Evaluation Note  Daniel Daniel Sharp , Daniel Sharp 88 y.o. male  was evaluated in triage.  Pt complains of oozing from surgical scar.  On 7/18 patient had [facility] left-sided nephrectomy.  There were no complications however yesterday there started to be some oozing from the site.  No pain.  Called urology today and they said it was reasonable to present to the emergency department for evaluation.  On Coumadin, did not stop this for the procedure.  Is on Coumadin for mitral valve replacement.  Son also says he has had intermittent slurred speech since surgery.  No confusion or weakness.  No history of CVA.  Review of Systems  Positive: Oozing from surgical site Negative:   Physical Exam  BP (!) 162/72 (BP Location: Left Arm)   Pulse (!) 44   Temp 97.8 F (36.6 C) (Oral)   Resp 16   SpO2 96%  Gen:   Awake, no distress   Resp:  Normal effort  MSK:   Moves extremities without difficulty  Other:  Moving all extremities, supplying his own history.  5 out of 5 strength in bilateral upper and lower extremities.  No problem with finger-nose.  PERRLA and EOMs intact.  Medical Decision Making  Medically screening exam initiated at 1:30 PM.  Appropriate orders placed.  Daniel Daniel Sharp was informed that the remainder of the evaluation will be completed by another provider, this initial triage assessment does not replace that evaluation, and the importance of remaining in the ED until their evaluation is complete.   Brain imaging ordered as well as stroke work-up.  We will add lactic for potential infection   Daniel Mcginness A, PA-C 01/27/22 1340

## 2022-01-30 ENCOUNTER — Ambulatory Visit (INDEPENDENT_AMBULATORY_CARE_PROVIDER_SITE_OTHER): Payer: Medicare HMO | Admitting: Family Medicine

## 2022-01-30 ENCOUNTER — Ambulatory Visit (INDEPENDENT_AMBULATORY_CARE_PROVIDER_SITE_OTHER): Payer: Medicare HMO

## 2022-01-30 VITALS — BP 150/70 | HR 56 | Temp 98.4°F | Ht 73.0 in | Wt 231.0 lb

## 2022-01-30 DIAGNOSIS — Z7901 Long term (current) use of anticoagulants: Secondary | ICD-10-CM | POA: Diagnosis not present

## 2022-01-30 DIAGNOSIS — R531 Weakness: Secondary | ICD-10-CM | POA: Diagnosis not present

## 2022-01-30 DIAGNOSIS — C642 Malignant neoplasm of left kidney, except renal pelvis: Secondary | ICD-10-CM | POA: Diagnosis not present

## 2022-01-30 DIAGNOSIS — R600 Localized edema: Secondary | ICD-10-CM

## 2022-01-30 DIAGNOSIS — G7000 Myasthenia gravis without (acute) exacerbation: Secondary | ICD-10-CM

## 2022-01-30 DIAGNOSIS — Z952 Presence of prosthetic heart valve: Secondary | ICD-10-CM

## 2022-01-30 LAB — POCT INR: INR: 1.5 — AB (ref 2.0–3.0)

## 2022-01-30 MED ORDER — WHEELCHAIR MISC: 1.0000 | Freq: Once | 0 refills | Status: AC

## 2022-01-30 NOTE — Patient Instructions (Addendum)
Pre visit review using our clinic review tool, if applicable. No additional management support is needed unless otherwise documented below in the visit note.  Increase dose today to take 1 1/2 tablets and increase dose tomorrow to take 1 1/2 tablets and then continue 1 tablet daily except take 1/2 tablet on Tuesdays and Saturdays. Recheck in 1 week.

## 2022-01-30 NOTE — Progress Notes (Signed)
Subjective:     Patient ID: Daniel Sharp, male    DOB: [birth year], 88 y.o.   MRN: [id number]  Chief Complaint  Patient presents with   Follow-up    Follow-up after surgery and discuss medications Need referral to neurology Discuss sleeping pills Handicap sticker and wheel chair     HPI  L RCC-just had L nephrectomy.   Talking more slowly since surgery and wounds were oozing on 7/23 and gas came out of naval site so went to ER-speech problems as well and abd enlarged.  Has f/u next wk w/urol.  Daniel Sharp after surgery-wasn't taking lasix for sev days.  Restarted.    Has used leg squeezers few times. Wearing compression stockings intermitt.  Doing better.   Myesthenia Gravis-mainly occular but pt thinks progresing as voice changing.  On prednisone. Mestinon in past-diarrhea and not working so not on.   Wants to consider another med.  Doesn't want to take steroids for mult reasons(1 being the slow healing).  Requesting neuro referral.   Taking '10mg'$ /day pred.   Neuro in [place], but down here more d/t decline in health, living w/son here.   Insomnia since surgery-not taking pain meds.  Nocturia x2 now.  Wants handicapped sticker.  Hard to get to car-tired.  So wants w/c.   H/o mitral valve replacement and a fib-on coumadin but was held for surgery so just restarted.  Seen in coumadin clinic today.   There are no preventive care reminders to display for this patient.  Past Medical History:  Diagnosis Date   A-fib [facility])    history of   Arthritis    Dyspnea    H/O mitral valve repair    Hypertension    Ocular myasthenia gravis ([place])    takes pred   Pneumonia     Past Surgical History:  Procedure Laterality Date   APPENDECTOMY     BACK SURGERY     cyst removed L3 and L4   CHOLECYSTECTOMY     FINGER SURGERY Right    R hand   MITRAL VALVE REPAIR     on coumadin   NOSE SURGERY     ROBOTIC ASSITED PARTIAL NEPHRECTOMY Left 01/22/2022   Procedure: XI ROBOTIC ASSITED NEPHRECTOMY;   Surgeon: Ceasar Mons, MD;  Location: [facility] ORS;  Service: Urology;  Laterality: Left;    Outpatient Medications Prior to Visit  Medication Sig Dispense Refill   atenolol (TENORMIN) 25 MG tablet Take 25 mg by mouth 2 (two) times daily.     benazepril (LOTENSIN) 40 MG tablet Take 40 mg by mouth daily.     Cholecalciferol (VITAMIN D3 PO) Take 1 tablet by mouth every other day.     docusate sodium (COLACE) 100 MG capsule Take 1 capsule (100 mg total) by mouth 2 (two) times daily. (Patient taking differently: Take 100 mg by mouth daily as needed for moderate constipation.)     furosemide (LASIX) 20 MG tablet Take 1 tablet (20 mg total) by mouth daily for 5 days. 5 tablet 0   glucosamine-chondroitin 500-400 MG tablet Take 1 tablet by mouth daily.     HYDROcodone-acetaminophen (NORCO/VICODIN) 5-325 MG tablet Take 1-2 tablets by mouth every 6 (six) hours as needed for moderate pain or severe pain. 20 tablet 0   nicotine polacrilex (COMMIT) 2 MG lozenge Take 2 mg by mouth as needed for smoking cessation.     predniSONE (DELTASONE) 5 MG tablet Take 5 mg by mouth daily.  promethazine (PHENERGAN) 12.5 MG tablet Take 1 tablet (12.5 mg total) by mouth every 4 (four) hours as needed for nausea or vomiting. 15 tablet 0   warfarin (COUMADIN) 5 MG tablet Take 2.5-5 mg by mouth daily. Take 2.5 mg by mouth on Tuesday and Saturday, take 5 mg by mouth on Monday, Wednesday, Thursday, Friday and Sunday.     hydrochlorothiazide (HYDRODIURIL) 25 MG tablet Take 25 mg by mouth daily.     furosemide (LASIX) 20 MG tablet Take 1 tablet (20 mg total) by mouth daily. (Patient not taking: Reported on 01/27/2022) 90 tablet 1   No facility-administered medications prior to visit.    Allergies  Allergen Reactions   Mestinon [Pyridostigmine Bromide] Diarrhea   ROS neg/noncontributory except as noted HPI/below No n/v/d/c.  No inc sob.  No bleeding.       Objective:     BP (!) 150/70   Pulse (!) 56   Temp  98.4 F (36.9 C) (Temporal)   Ht '6\' 1"'$  (1.854 m)   Wt 231 lb (104.8 kg)   SpO2 95%   BMI 30.48 kg/m  Wt Readings from Last 3 Encounters:  01/30/22 231 lb (104.8 kg)  01/27/22 225 lb (102.1 kg)  01/23/22 219 lb 11 oz (99.6 kg)    Physical Exam   Gen: WDWN NAD pale wm HEENT: NCAT, conjunctiva not injected, sclera nonicteric NECK:  supple, no thyromegaly, no nodes, no carotid bruits CARDIAC: brady RRR, S1S2+, +murmur. DP 1+B LUNGS: CTAB. No wheezes ABDOMEN:  BS+, soft, NT.  Some mild distention.  He does have some clear fluid coming from 2 of his surgical wounds.  No tenderness.  No sign of infection.   EXT:  wearing compression stockings.  Still w/chronic, tense edema. MSK: in w/c  .  NEURO: A&O x3.  CN II-XII intact.  PSYCH: normal mood. Good eye contact  Reviewed op notes, er note from 7/23 and studies. Filled out handicapped form.  Spent 25mn wpt and son, 190m reviewing records for total 4531m     Assessment & Plan:   Problem List Items Addressed This Visit       Nervous and Auditory   Myasthenia gravis without acute exacerbation (HCC)     Genitourinary   Renal cell carcinoma of left kidney (HCC) - Primary     Other   H/O mitral valve replacement   Localized edema   Other Visit Diagnoses     Weakness         1.  Renal cell carcinoma left kidney-status post recent left nephrectomy.  Patient seems to be recovering well.  Has follow-up next week with urology.  Monitor wounds closely. 2.  Myasthenia gravis-patient has weaned down to 10 mg of prednisone.  Originally this was ocular myasthenia, however patient feels like it is progressing.  He does have some slowed speech.  Did have a CT of the brain in the ER.  No acute findings.  Patient would like to establish with neurology here as [place] abruptly as he has been on it for so many years.  Will refer to  neurology 3.  Localized edema-this is a chronic problem.  He wears compression stockings intermittently.  Uses his leg squeezers intermittently.  Had been off of Lasix while in the hospital, however the edema worsened.  He is back on the Lasix.  Back to his baseline.  Continue  daily Lasix.  Removed hydrochlorothiazide from his med list as he was confused on whether to take it or not (after reviewing my last note, had told him to stop taking it) 4.  History of mitral valve replacement and paroxysmal A-fib-patient is on Coumadin, however it was held for surgery and he has not started it back.  He was seen in the Coumadin clinic today.  Was given instructions on dosing. 5.  Weakness with exertion-multifactorial.  He has chronic bradycardia from atenolol for A-fib control and blood pressure control.  He is anemic so I expect more fatigue with ambulation.  Also very deconditioned.  He has been to physical therapy in the past, however is noncompliant with home exercise program.  He declines physical therapy today.  I did fill out a handicapped permit form.  Also wrote a prescription for a wheelchair.  Advised not to become dependent on the wheelchair all the time, but definitely use it when having to travel long distances.  Just had labs 3 days ago.  Meds ordered this encounter  Medications   Misc. Devices [facility]) MISC    Sig: 1 each by Does not apply route once for 1 dose.    Dispense:  1 each    Refill:  0    Wellington Hampshire, MD

## 2022-01-30 NOTE — Progress Notes (Addendum)
Pt had nephrectomy on 7/18 and was d/c 2 days later. Pt held warfarin for 5 days. Pt was in ER for increasing edema on 7/23 and was advised to take lasix. Pt was not taking already prescribed lasix.   Pt reported he did not take coumadin  as directed per coumadin clinic because he was confused and urology told him a different dosing.  Pt has apt with PCP today for f/u.  Increase dose today to take 1 1/2 tablets and increase dose tomorrow to take 1 1/2 tablets and then continue 1 tablet daily except take 1/2 tablet on Tuesdays and Saturdays. Recheck in 1 week.

## 2022-01-31 ENCOUNTER — Encounter: Payer: Self-pay | Admitting: Neurology

## 2022-02-04 ENCOUNTER — Encounter: Payer: Self-pay | Admitting: Neurology

## 2022-02-06 ENCOUNTER — Ambulatory Visit (INDEPENDENT_AMBULATORY_CARE_PROVIDER_SITE_OTHER): Payer: Medicare HMO

## 2022-02-06 DIAGNOSIS — Z7901 Long term (current) use of anticoagulants: Secondary | ICD-10-CM

## 2022-02-06 LAB — POCT INR: INR: 1.8 — AB (ref 2.0–3.0)

## 2022-02-06 NOTE — Patient Instructions (Addendum)
Pre visit review using our clinic review tool, if applicable. No additional management support is needed unless otherwise documented below in the visit note.  Increase dose today to take 1 1/2 tablets and then change weekly dose to take 1 tablet daily except take 1/2 tablet on Saturdays. Recheck in 3 week.

## 2022-02-06 NOTE — Progress Notes (Signed)
Increase dose today to take 1 1/2 tablets and then change weekly dose to take 1 tablet daily except take 1/2 tablet on Saturdays. Recheck in 3 week.

## 2022-02-08 ENCOUNTER — Ambulatory Visit: Payer: Medicare HMO | Admitting: Neurology

## 2022-02-11 ENCOUNTER — Encounter: Payer: Self-pay | Admitting: Neurology

## 2022-02-11 ENCOUNTER — Ambulatory Visit: Payer: Medicare HMO | Admitting: Neurology

## 2022-02-11 VITALS — BP 124/57 | HR 55 | Ht 73.0 in | Wt 198.0 lb

## 2022-02-11 DIAGNOSIS — G7000 Myasthenia gravis without (acute) exacerbation: Secondary | ICD-10-CM

## 2022-02-11 MED ORDER — PYRIDOSTIGMINE BROMIDE 60 MG PO TABS: ORAL_TABLET | ORAL | 1 refills | Status: DC

## 2022-02-11 MED ORDER — PREDNISONE 5 MG PO TABS: 10.0000 mg | ORAL_TABLET | Freq: Every day | ORAL | 1 refills | Status: AC

## 2022-02-11 NOTE — Patient Instructions (Signed)
Continue prednisone '10mg'$  daily  Start mestinon '60mg'$  1-2 times daily as needed  Return to clinic 4 months

## 2022-02-11 NOTE — Progress Notes (Signed)
[facility] Clinic Note - Initial Visit   Date: 02/11/22  Daniel Sharp MRN: [id number] DOB: 01-25-1933   Dear Dr. Cherlynn Kaiser:  Thank you for your kind referral of Daniel Sharp for consultation of myasthenia gravis. Although his history is well known to you, please allow [place] to reiterate it for the purpose of our medical record. The patient was accompanied to the clinic by son Darnell Level) who also provides collateral information.     History of Present Illness: Daniel Sharp is a 88 y.o. right-handed male with atrial fibrillation, s/p MVR, hypertension, left kidney mass s/p nephrectomy, and tobacco use presenting for evaluation of ocular seropositive myasthenia gravis s/p thymectomy.   He has a long history of ocular myasthenia gravis which was diagnosed in 16 at [facility].  Clinic notes from prior neurologist indicate he may have been antibody positive.  He underwent thymectomy and feels that it may have helped some.  Since this time, he has intermittent and fluctuating right > left eyelid droopiness and double vision.  He has been on mestinon in the past, which was helpful, as well as prednisone.  The highest dose of prednisone is '15mg'$ . He currently takes prednisone '10mg'$  since June.   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  Patient was concerned that prednisone caused abdominal adhesions and wanted to explore other medication options. He denies problems with speech/swallow or limb weakness.    He has been living in [place] with his son for the past 5 month and previously in [place].  He uses a wheelchair and walker because of orthopeadic issues.     Past Medical History:  Diagnosis Date   A-fib [facility])    history of   Arthritis    Dyspnea    H/O mitral valve repair    Hypertension    Ocular myasthenia gravis ([place])    takes pred   Pneumonia     Past Surgical History:  Procedure Laterality Date   APPENDECTOMY     BACK  SURGERY     cyst removed L3 and L4   CHOLECYSTECTOMY     FINGER SURGERY Right    R hand   MITRAL VALVE REPAIR     on coumadin   NOSE SURGERY     ROBOTIC ASSITED PARTIAL NEPHRECTOMY Left 01/22/2022   Procedure: XI ROBOTIC ASSITED NEPHRECTOMY;  Surgeon: Ceasar Mons, MD;  Location: [facility];  Service: Urology;  Laterality: Left;     Medications:  Outpatient Encounter Medications as of 02/11/2022  Medication Sig Note   atenolol (TENORMIN) 25 MG tablet Take 25 mg by mouth 2 (two) times daily.    benazepril (LOTENSIN) 40 MG tablet Take 40 mg by mouth daily.    Cholecalciferol (VITAMIN D3 PO) Take 1 tablet by mouth every other day.    furosemide (LASIX) 20 MG tablet Take 1 tablet (20 mg total) by mouth daily for 5 days.    glucosamine-chondroitin 500-400 MG tablet Take 1 tablet by mouth daily.    HYDROcodone-acetaminophen (NORCO/VICODIN) 5-325 MG tablet Take 1-2 tablets by mouth every 6 (six) hours as needed for moderate pain or severe pain.    nicotine polacrilex (COMMIT) 2 MG lozenge Take 2 mg by mouth as needed for smoking cessation.    predniSONE (DELTASONE) 5 MG tablet Take 5 mg by mouth daily. 01/27/2022: RX filled for 90 and 30 DS but pt verifies they only take one daily   promethazine (PHENERGAN) 12.5 MG tablet Take 1 tablet (12.5  mg total) by mouth every 4 (four) hours as needed for nausea or vomiting. 01/27/2022: HAS ON HAND    warfarin (COUMADIN) 5 MG tablet Take 2.5-5 mg by mouth daily. Take 2.5 mg by mouth on Tuesday and Saturday, take 5 mg by mouth on Monday, Wednesday, Thursday, Friday and Sunday.    docusate sodium (COLACE) 100 MG capsule Take 1 capsule (100 mg total) by mouth 2 (two) times daily. (Patient not taking: Reported on 02/11/2022)    No facility-administered encounter medications on file as of 02/11/2022.    Allergies:  Allergies  Allergen Reactions   Mestinon [Pyridostigmine Bromide] Diarrhea    Family History: Family History  Problem Relation Age of  Onset   Other Mother        Gallbladder removed   Stroke Father        intracranial stroke   Stroke Brother     Social History: Social History   Tobacco Use   Smoking status: Former    Types: Cigarettes, Pipe, Cigars   Smokeless tobacco: Former    Types: Chew   Tobacco comments:    Uses nicotine gum   Vaping Use   Vaping Use: Never used  Substance Use Topics   Alcohol use: Yes    Alcohol/week: 7.0 standard drinks of alcohol    Types: 7 Glasses of wine per week    Comment: Drinks wine when sons will let him   Drug use: Never   Social History   Social History Narrative   Lives in [place], [place].  Son lives in [place]      Right [place]    Lives in a one story home with a basement.     Vital Signs:  BP (!) 124/57   Pulse (!) 55   Ht '6\' 1"'$  (1.854 m)   Wt 198 lb (89.8 kg)   SpO2 90%   BMI 26.12 kg/m    Neurological Exam: MENTAL STATUS including orientation to time, place, person, recent and remote memory, attention span and concentration, language, and fund of knowledge is normal.  Speech is not dysarthric.  CRANIAL NERVES: II:  No visual field defects.    III-IV-VI: Pupils equal round and reactive to light.  Normal conjugate, extra-ocular eye movements in all directions of gaze.  No nystagmus.  Mild bilateral ptosis, no worsening with sustained upgaze.   V:  Normal facial sensation.    VII:  Normal facial symmetry and movements.  Facial muscles including orbicularis oculi, orbicularis oris, and buccinator is 5/5 VIII:  Normal hearing and vestibular function.   IX-X:  Normal palatal movement.   XI:  Normal shoulder shrug and head rotation.   XII:  Normal tongue strength and range of motion, no deviation or fasciculation.  MOTOR:  Motor strength is 5/5 throughout, including neck flexion. No atrophy, fasciculations or abnormal movements.  No pronator drift.   MSRs:  Right        Left                  brachioradialis 2+  2+  biceps 2+  2+  triceps 2+  2+   patellar 2+  2+  ankle jerk 0  0  plantar response down  down   SENSORY:  Vibration and temperature reduced below the ankles bilaterally, otherwise sensation intact throughout.   COORDINATION/GAIT: Normal finger-to- nose-finger.  Intact rapid alternating movements bilaterally.  Gait not tested, patient arrived in wheelchair   IMPRESSION: Ocular myasthenia gravis s/p thymectomy, AChR positive, diagnosed  in 1998.  - Discussed that it is very unlikely that prednisone resulted in abdominal adhesions and that I recommend staying on prednisone '10mg'$ .  Although other medication options are available for MG, with purely ocular symptoms that are controlled on low-dose prednisone, I do not recommend any escalation in therapy  - Start mestinon '60mg'$  1-2 tablets daily as needed for worsening diplopia  Return to clinic in 4 months.   Thank you for allowing me to participate in patient's care.  If I can answer any additional questions, I would be pleased to do so.    Sincerely,    Leightyn Cina K. Posey Pronto, DO

## 2022-02-13 ENCOUNTER — Emergency Department (HOSPITAL_COMMUNITY): Payer: Medicare HMO

## 2022-02-13 ENCOUNTER — Observation Stay (HOSPITAL_COMMUNITY)
Admission: EM | Admit: 2022-02-13 | Discharge: 2022-02-19 | Disposition: A | Discharge status: Skilled Nursing Facility | Payer: Medicare HMO | Attending: Internal Medicine | Admitting: Internal Medicine

## 2022-02-13 ENCOUNTER — Other Ambulatory Visit: Payer: Self-pay

## 2022-02-13 ENCOUNTER — Encounter (HOSPITAL_COMMUNITY): Payer: Self-pay

## 2022-02-13 DIAGNOSIS — I129 Hypertensive chronic kidney disease with stage 1 through stage 4 chronic kidney disease, or unspecified chronic kidney disease: Secondary | ICD-10-CM | POA: Diagnosis not present

## 2022-02-13 DIAGNOSIS — N182 Chronic kidney disease, stage 2 (mild): Secondary | ICD-10-CM | POA: Insufficient documentation

## 2022-02-13 DIAGNOSIS — R5383 Other fatigue: Secondary | ICD-10-CM | POA: Diagnosis not present

## 2022-02-13 DIAGNOSIS — E876 Hypokalemia: Secondary | ICD-10-CM | POA: Insufficient documentation

## 2022-02-13 DIAGNOSIS — Z87891 Personal history of nicotine dependence: Secondary | ICD-10-CM | POA: Diagnosis not present

## 2022-02-13 DIAGNOSIS — Z9181 History of falling: Secondary | ICD-10-CM | POA: Insufficient documentation

## 2022-02-13 DIAGNOSIS — I517 Cardiomegaly: Secondary | ICD-10-CM | POA: Insufficient documentation

## 2022-02-13 DIAGNOSIS — R638 Other symptoms and signs concerning food and fluid intake: Secondary | ICD-10-CM | POA: Insufficient documentation

## 2022-02-13 DIAGNOSIS — N179 Acute kidney failure, unspecified: Secondary | ICD-10-CM | POA: Diagnosis not present

## 2022-02-13 DIAGNOSIS — I4811 Longstanding persistent atrial fibrillation: Secondary | ICD-10-CM | POA: Diagnosis not present

## 2022-02-13 DIAGNOSIS — Z85528 Personal history of other malignant neoplasm of kidney: Secondary | ICD-10-CM | POA: Insufficient documentation

## 2022-02-13 DIAGNOSIS — C642 Malignant neoplasm of left kidney, except renal pelvis: Secondary | ICD-10-CM | POA: Diagnosis present

## 2022-02-13 DIAGNOSIS — R2681 Unsteadiness on feet: Secondary | ICD-10-CM | POA: Diagnosis not present

## 2022-02-13 DIAGNOSIS — R531 Weakness: Secondary | ICD-10-CM | POA: Insufficient documentation

## 2022-02-13 DIAGNOSIS — R17 Unspecified jaundice: Secondary | ICD-10-CM | POA: Diagnosis not present

## 2022-02-13 DIAGNOSIS — R262 Difficulty in walking, not elsewhere classified: Secondary | ICD-10-CM | POA: Insufficient documentation

## 2022-02-13 DIAGNOSIS — I4819 Other persistent atrial fibrillation: Secondary | ICD-10-CM | POA: Diagnosis present

## 2022-02-13 DIAGNOSIS — Z7901 Long term (current) use of anticoagulants: Secondary | ICD-10-CM | POA: Insufficient documentation

## 2022-02-13 DIAGNOSIS — E860 Dehydration: Secondary | ICD-10-CM | POA: Diagnosis not present

## 2022-02-13 DIAGNOSIS — I10 Essential (primary) hypertension: Secondary | ICD-10-CM | POA: Diagnosis present

## 2022-02-13 DIAGNOSIS — Z79899 Other long term (current) drug therapy: Secondary | ICD-10-CM | POA: Diagnosis not present

## 2022-02-13 DIAGNOSIS — D631 Anemia in chronic kidney disease: Secondary | ICD-10-CM | POA: Diagnosis not present

## 2022-02-13 DIAGNOSIS — R001 Bradycardia, unspecified: Secondary | ICD-10-CM

## 2022-02-13 DIAGNOSIS — Z9889 Other specified postprocedural states: Secondary | ICD-10-CM | POA: Insufficient documentation

## 2022-02-13 DIAGNOSIS — Z952 Presence of prosthetic heart valve: Secondary | ICD-10-CM

## 2022-02-13 DIAGNOSIS — R0989 Other specified symptoms and signs involving the circulatory and respiratory systems: Secondary | ICD-10-CM

## 2022-02-13 DIAGNOSIS — R778 Other specified abnormalities of plasma proteins: Principal | ICD-10-CM | POA: Insufficient documentation

## 2022-02-13 DIAGNOSIS — E7800 Pure hypercholesterolemia, unspecified: Secondary | ICD-10-CM | POA: Diagnosis not present

## 2022-02-13 LAB — COMPREHENSIVE METABOLIC PANEL WITH GFR
ALT: 15 U/L (ref 0–44)
AST: 18 U/L (ref 15–41)
Albumin: 4.1 g/dL (ref 3.5–5.0)
Alkaline Phosphatase: 63 U/L (ref 38–126)
Anion gap: 11 (ref 5–15)
BUN: 53 mg/dL — ABNORMAL HIGH (ref 8–23)
CO2: 33 mmol/L — ABNORMAL HIGH (ref 22–32)
Calcium: 10.8 mg/dL — ABNORMAL HIGH (ref 8.9–10.3)
Chloride: 93 mmol/L — ABNORMAL LOW (ref 98–111)
Creatinine, Ser: 1.72 mg/dL — ABNORMAL HIGH (ref 0.61–1.24)
GFR, Estimated: 38 mL/min — ABNORMAL LOW
Glucose, Bld: 115 mg/dL — ABNORMAL HIGH (ref 70–99)
Potassium: 3.3 mmol/L — ABNORMAL LOW (ref 3.5–5.1)
Sodium: 137 mmol/L (ref 135–145)
Total Bilirubin: 1.5 mg/dL — ABNORMAL HIGH (ref 0.3–1.2)
Total Protein: 7.6 g/dL (ref 6.5–8.1)

## 2022-02-13 LAB — CBC WITH DIFFERENTIAL/PLATELET
Abs Immature Granulocytes: 0.08 10*3/uL — ABNORMAL HIGH (ref 0.00–0.07)
Basophils Absolute: 0 10*3/uL (ref 0.0–0.1)
Basophils Relative: 0 %
Eosinophils Absolute: 0 10*3/uL (ref 0.0–0.5)
Eosinophils Relative: 0 %
HCT: 34.7 % — ABNORMAL LOW (ref 39.0–52.0)
Hemoglobin: 11.5 g/dL — ABNORMAL LOW (ref 13.0–17.0)
Immature Granulocytes: 1 %
Lymphocytes Relative: 9 %
Lymphs Abs: 1.2 10*3/uL (ref 0.7–4.0)
MCH: 31.4 pg (ref 26.0–34.0)
MCHC: 33.1 g/dL (ref 30.0–36.0)
MCV: 94.8 fL (ref 80.0–100.0)
Monocytes Absolute: 1.2 10*3/uL — ABNORMAL HIGH (ref 0.1–1.0)
Monocytes Relative: 9 %
Neutro Abs: 11.1 10*3/uL — ABNORMAL HIGH (ref 1.7–7.7)
Neutrophils Relative %: 81 %
Platelets: 254 10*3/uL (ref 150–400)
RBC: 3.66 MIL/uL — ABNORMAL LOW (ref 4.22–5.81)
RDW: 13 % (ref 11.5–15.5)
WBC: 13.6 10*3/uL — ABNORMAL HIGH (ref 4.0–10.5)
nRBC: 0 % (ref 0.0–0.2)

## 2022-02-13 LAB — URINALYSIS, ROUTINE W REFLEX MICROSCOPIC
Bacteria, UA: NONE SEEN
Bilirubin Urine: NEGATIVE
Glucose, UA: NEGATIVE mg/dL
Ketones, ur: NEGATIVE mg/dL
Leukocytes,Ua: NEGATIVE
Nitrite: NEGATIVE
Protein, ur: NEGATIVE mg/dL
Specific Gravity, Urine: 1.009 (ref 1.005–1.030)
pH: 6 (ref 5.0–8.0)

## 2022-02-13 LAB — TROPONIN I (HIGH SENSITIVITY)
Troponin I (High Sensitivity): 46 ng/L — ABNORMAL HIGH
Troponin I (High Sensitivity): 39 ng/L — ABNORMAL HIGH (ref <18)

## 2022-02-13 LAB — APTT: aPTT: 40 seconds — ABNORMAL HIGH (ref 24–36)

## 2022-02-13 LAB — PROTIME-INR
INR: 2.7 — ABNORMAL HIGH (ref 0.8–1.2)
Prothrombin Time: 28.1 s — ABNORMAL HIGH (ref 11.4–15.2)

## 2022-02-13 LAB — LACTIC ACID, PLASMA: Lactic Acid, Venous: 1.2 mmol/L (ref 0.5–1.9)

## 2022-02-13 MED ORDER — WARFARIN SODIUM 5 MG PO TABS
5.0000 mg | ORAL_TABLET | Freq: Once | ORAL | Status: AC
  Administered 2022-02-13: 5 mg via ORAL
  Filled 2022-02-13: qty 1

## 2022-02-13 MED ORDER — ACETAMINOPHEN 650 MG RE SUPP: 650.0000 mg | Freq: Four times a day (QID) | RECTAL | Status: DC | PRN

## 2022-02-13 MED ORDER — DIPHENOXYLATE-ATROPINE 2.5-0.025 MG PO TABS: 1.0000 | ORAL_TABLET | Freq: Four times a day (QID) | ORAL | Status: DC | PRN

## 2022-02-13 MED ORDER — ENSURE ENLIVE PO LIQD
237.0000 mL | Freq: Two times a day (BID) | ORAL | Status: DC
  Administered 2022-02-14 – 2022-02-19 (×12): 237 mL via ORAL

## 2022-02-13 MED ORDER — ONDANSETRON HCL 4 MG PO TABS: 4.0000 mg | ORAL_TABLET | Freq: Four times a day (QID) | ORAL | Status: DC | PRN

## 2022-02-13 MED ORDER — TAMSULOSIN HCL 0.4 MG PO CAPS
0.4000 mg | ORAL_CAPSULE | Freq: Every day | ORAL | Status: DC
  Administered 2022-02-14 – 2022-02-19 (×6): 0.4 mg via ORAL
  Filled 2022-02-13 (×6): qty 1

## 2022-02-13 MED ORDER — PYRIDOSTIGMINE BROMIDE 60 MG PO TABS
60.0000 mg | ORAL_TABLET | Freq: Three times a day (TID) | ORAL | Status: DC
  Administered 2022-02-13 – 2022-02-19 (×17): 60 mg via ORAL
  Filled 2022-02-13 (×19): qty 1

## 2022-02-13 MED ORDER — ACETAMINOPHEN 325 MG PO TABS: 650.0000 mg | ORAL_TABLET | Freq: Four times a day (QID) | ORAL | Status: DC | PRN

## 2022-02-13 MED ORDER — HYDROCODONE-ACETAMINOPHEN 5-325 MG PO TABS
1.0000 | ORAL_TABLET | Freq: Four times a day (QID) | ORAL | Status: DC | PRN
  Administered 2022-02-16: 1 via ORAL
  Filled 2022-02-13: qty 1

## 2022-02-13 MED ORDER — PYRIDOSTIGMINE BROMIDE 60 MG PO TABS
60.0000 mg | ORAL_TABLET | Freq: Two times a day (BID) | ORAL | Status: DC | PRN
  Filled 2022-02-13: qty 1

## 2022-02-13 MED ORDER — ONDANSETRON HCL 4 MG/2ML IJ SOLN: 4.0000 mg | Freq: Four times a day (QID) | INTRAMUSCULAR | Status: DC | PRN

## 2022-02-13 MED ORDER — POTASSIUM CHLORIDE CRYS ER 20 MEQ PO TBCR
40.0000 meq | EXTENDED_RELEASE_TABLET | Freq: Once | ORAL | Status: AC
Start: 2022-02-13 — End: 2022-02-13
  Administered 2022-02-13: 40 meq via ORAL
  Filled 2022-02-13: qty 2

## 2022-02-13 MED ORDER — PREDNISONE 20 MG PO TABS
10.0000 mg | ORAL_TABLET | Freq: Every day | ORAL | Status: DC
  Administered 2022-02-14 – 2022-02-19 (×6): 10 mg via ORAL
  Filled 2022-02-13 (×6): qty 1

## 2022-02-13 MED ORDER — WARFARIN - PHARMACIST DOSING INPATIENT
Freq: Every day | Status: DC
Start: 2022-02-14 — End: 2022-02-19

## 2022-02-13 NOTE — H&P (Signed)
History and Physical    Patient: Daniel Sharp IWP:[id number] DOB: 1933/05/17 DOA: 02/13/2022 DOS: the patient was seen and examined on 02/13/2022 PCP: [name], MD  Patient coming from: Home  Chief Complaint:  Chief Complaint  Patient presents with   Extremity Weakness   HPI: Daniel Sharp is a 88 y.o. male with medical history significant of paroxysmal atrial fibrillation on warfarin, osteoarthritis, dyspnea, mitral valve repair, hypertension, history of pneumonia, ocular myasthenia gravis who is coming to the emergency department due to bilateral lower extremity weakness since this morning.  He thinks he may have had a low-grade fever and has had chills. He denied rhinorrhea, sore throat, wheezing or hemoptysis.  No chest pain, palpitations, diaphoresis, PND, orthopnea or pitting edema of the lower extremities.  No abdominal pain, nausea, emesis, diarrhea, constipation, melena or hematochezia.  No flank pain, dysuria, frequency or hematuria.  No polyuria, polydipsia, polyphagia or blurred vision.   ED course: Initial vital signs were temperature 99.2 F, pulse 79, respirations 16, BP 114/66 mmHg O2 sat 91% on room air. The patient was given 1000 mL of IVF bolus.  Lab work: His urinalysis showed moderate hemoglobinuria but was otherwise unremarkable.  CBC showed a white count 13.6, hemoglobin 11.5 g/dL platelets 254.  PT 28.1, INR 2.7 and PTT 40.  Lactic acid was normal.  Troponin was 46 and then 39 ng/L.  CMP showed a potassium of 3.3, chloride 93 and CO2 33 mmol/L.  Anion gap was normal.  Bilirubin 1.5, glucose 115, BUN 53, creatinine 1.72 and calcium 10.8 mg/dL.  The rest of the LFTs were normal.  Imaging: 2 view chest radiograph shows stable cardiomegaly with mild central pulmonary vascular congestion.  Review of Systems: As mentioned in the history of present illness. All other systems reviewed and are negative.  Past Medical History:  Diagnosis Date   A-fib [facility])    history of    Arthritis    Dyspnea    H/O mitral valve repair    Hypertension    Ocular myasthenia gravis ([place])    takes pred   Pneumonia    Past Surgical History:  Procedure Laterality Date   APPENDECTOMY     BACK SURGERY     cyst removed L3 and L4   CHOLECYSTECTOMY     FINGER SURGERY Right    R hand   MITRAL VALVE REPAIR     on coumadin   NOSE SURGERY     ROBOTIC ASSITED PARTIAL NEPHRECTOMY Left 01/22/2022   Procedure: XI ROBOTIC ASSITED NEPHRECTOMY;  Surgeon: Ceasar Mons, MD;  Location: [facility];  Service: Urology;  Laterality: Left;   Social History:  reports that he has quit smoking. His smoking use included cigarettes, pipe, and cigars. He has quit using smokeless tobacco.  His smokeless tobacco use included chew. He reports current alcohol use of about 7.0 standard drinks of alcohol per week. He reports that he does not use drugs.  Allergies  Allergen Reactions   Mestinon [Pyridostigmine Bromide] Diarrhea    Family History  Problem Relation Age of Onset   Other Mother        Gallbladder removed   Stroke Father        intracranial stroke   Stroke Brother     Prior to Admission medications   Medication Sig Start Date End Date Taking? Authorizing Provider  atenolol (TENORMIN) 25 MG tablet Take 25 mg by mouth 2 (two) times daily.    [provider]  benazepril (LOTENSIN)  40 MG tablet Take 40 mg by mouth daily.    [provider]  Cholecalciferol (VITAMIN D3 PO) Take 1 tablet by mouth every other day.    [provider]  docusate sodium (COLACE) 100 MG capsule Take 1 capsule (100 mg total) by mouth 2 (two) times daily. Patient not taking: Reported on 02/11/2022 01/22/22   Debbrah Alar, PA-C  furosemide (LASIX) 20 MG tablet Take 1 tablet (20 mg total) by mouth daily for 5 days. 01/27/22 02/11/22  Luna Fuse, MD  glucosamine-chondroitin 500-400 MG tablet Take 1 tablet by mouth daily.    [provider]  HYDROcodone-acetaminophen  (NORCO/VICODIN) 5-325 MG tablet Take 1-2 tablets by mouth every 6 (six) hours as needed for moderate pain or severe pain. 01/22/22   Debbrah Alar, PA-C  nicotine polacrilex (COMMIT) 2 MG lozenge Take 2 mg by mouth as needed for smoking cessation.    [provider]  predniSONE (DELTASONE) 5 MG tablet Take 2 tablets (10 mg total) by mouth daily. 02/11/22   Narda Amber K, DO  promethazine (PHENERGAN) 12.5 MG tablet Take 1 tablet (12.5 mg total) by mouth every 4 (four) hours as needed for nausea or vomiting. 01/22/22   Debbrah Alar, PA-C  pyridostigmine (MESTINON) 60 MG tablet Take twice daily as need. 02/11/22   Narda Amber K, DO  warfarin (COUMADIN) 5 MG tablet Take 2.5-5 mg by mouth daily. Take 2.5 mg by mouth on Tuesday and Saturday, take 5 mg by mouth on Monday, Wednesday, Thursday, Friday and Sunday.    [provider]    Physical Exam: Vitals:   02/13/22 1530 02/13/22 1531 02/13/22 1545 02/13/22 1730  BP: (!) 144/75  (!) 152/73 129/82  Pulse: 60  61 61  Resp: 17  20 (!) 23  Temp: 98.7 F (37.1 C) 98.4 F (36.9 C)    TempSrc: Oral Oral    SpO2: 96%  94% 96%  Weight:      Height:       Physical Exam Vitals and nursing note reviewed.  Constitutional:      General: He is awake. He is not in acute distress.    Appearance: He is ill-appearing. He is not toxic-appearing.  HENT:     Head: Normocephalic.     Mouth/Throat:     Mouth: Mucous membranes are dry.  Eyes:     General: No scleral icterus.    Pupils: Pupils are equal, round, and reactive to light.  Neck:     Vascular: No JVD.  Cardiovascular:     Rate and Rhythm: Normal rate and regular rhythm.     Heart sounds: S1 normal and S2 normal.  Pulmonary:     Effort: Pulmonary effort is normal.     Breath sounds: Normal breath sounds.  Abdominal:     General: Bowel sounds are normal. There is no distension.     Palpations: Abdomen is soft.     Tenderness: There is no abdominal tenderness. There is no  guarding.  Musculoskeletal:     Cervical back: Neck supple.     Right lower leg: No edema.     Left lower leg: No edema.     Comments: Moderate generalized weakness.  Skin:    General: Skin is warm and dry.  Neurological:     General: No focal deficit present.     Mental Status: He is alert and oriented to person, place, and time.  Psychiatric:        Mood and  Affect: Mood normal.        Behavior: Behavior normal. Behavior is cooperative.   Data Reviewed:  There are no new results to review at this time.  Assessment and Plan: Principal Problem:   Elevated troponin Observation/telemetry. Likely demand ischemia. Trending down. Already on beta-blockers. Repeat EKG in the morning. Check echocardiogram in a.m.  Active Problems:   Generalized weakness Exacerbated, now generalized MG. Will order Mestinon every 8 hours, not PRN. Continue prednisone 10 mg p.o. daily.    Hypokalemia Replaced.    Hypercalcemia Recheck calcium level in AM.    Hyperbilirubinemia Recheck bilirubin level in AM.    Renal cell carcinoma of left kidney (HCC) Status post nephrectomy. Analgesics as needed.    Hypertension Hold benazepril and atenolol. Monitor blood pressure. Resume antihypertensives in AM.    Hypercholesterolemia Not on medical therapy.  Follow-up with PCP.    Longstanding persistent atrial fibrillation (HCC)   H/O mitral valve replacement Currently not on rate control meds. Continue warfarin per pharmacy.     Advance Care Planning:   Code Status: Full Code   Consults:   Family Communication: His son was at bedside.  Severity of Illness: The appropriate patient status for this patient is OBSERVATION. Observation status is judged to be reasonable and necessary in order to provide the required intensity of service to ensure the patient's safety. The patient's presenting symptoms, physical exam findings, and initial radiographic and laboratory data in the context of  their medical condition is felt to place them at decreased risk for further clinical deterioration. Furthermore, it is anticipated that the patient will be medically stable for discharge from the hospital within 2 midnights of admission.   Author: Reubin Milan, MD 02/13/2022 6:29 PM  For on call review [email or web address].   This document was prepared using Dragon voice recognition software and may contain some unintended transcription errors.

## 2022-02-13 NOTE — ED Triage Notes (Signed)
Pt c/o bilateral leg weakness that has worsened over the last couple of weeks. Pt did have a fall this morning due to the weakness. Pt is AxOx4. Pt is on coumadin. Denies hitting his head.

## 2022-02-13 NOTE — ED Notes (Signed)
Pt not able to ambulate around the room. Feeling weak to walk around

## 2022-02-13 NOTE — ED Provider Triage Note (Signed)
Emergency Medicine Provider Triage Evaluation Note  Daniel Sharp , a 88 y.o. male  was evaluated in triage.  Pt complains of bilateral leg weakness since this AM. Two weeks ago, the patient had a nephrectomy from "a mass". Son reports he is usually not steady on his feet and needs furniture to walk around. This morning, his legs felt weak and couldn't stand.  Review of Systems  Positive:  Negative:   Physical Exam  BP 114/66   Pulse 79   Temp 99.2 F (37.3 C)   Resp 16   Ht '6\' 1"'$  (1.854 m)   Wt 89.8 kg   SpO2 91%   BMI 26.12 kg/m  Gen:   Awake, no distress   Resp:  Normal effort  MSK:   Moves extremities without difficulty  Other:  Sensation intact. Cranial nerves grossly intact.   Medical Decision Making  Medically screening exam initiated at 11:14 AM.  Appropriate orders placed.  Daniel Sharp was informed that the remainder of the evaluation will be completed by another provider, this initial triage assessment does not replace that evaluation, and the importance of remaining in the ED until their evaluation is complete.  Will order labs and imaging. No focal deficit.    Daniel Puller, PA-C 02/13/22 1118

## 2022-02-13 NOTE — Progress Notes (Signed)
ANTICOAGULATION CONSULT NOTE - Initial Consult  Pharmacy Consult for warfarin Indication: history of atrial fibrillation and mitral valve repair  Allergies  Allergen Reactions   Mestinon [Pyridostigmine Bromide] Diarrhea    Patient Measurements: Height: '6\' 1"'$  (185.4 cm) Weight: 89.8 kg (198 lb) IBW/kg (Calculated) : 79.9  Vital Signs: Temp: 98.4 F (36.9 C) (08/09 1531) Temp Source: Oral (08/09 1531) BP: 129/82 (08/09 1730) Pulse Rate: 61 (08/09 1730)  Labs: Recent Labs    02/13/22 1150 02/13/22 1433  HGB 11.5*  --   HCT 34.7*  --   PLT 254  --   APTT 40*  --   LABPROT 28.1*  --   INR 2.7*  --   CREATININE 1.72*  --   TROPONINIHS 46* 39*    Estimated Creatinine Clearance: 34.8 mL/min (A) (by C-G formula based on SCr of 1.72 mg/dL (H)).   Medical History: Past Medical History:  Diagnosis Date   A-fib [facility])    history of   Arthritis    Dyspnea    H/O mitral valve repair    Hypertension    Ocular myasthenia gravis ([place])    takes pred   Pneumonia     Medications:  Scheduled:   predniSONE  10 mg Oral Daily   pyridostigmine  60 mg Oral Q8H   [START ON 02/14/2022] tamsulosin  0.4 mg Oral Daily   [START ON 02/14/2022] Warfarin - Pharmacist Dosing Inpatient   Does not apply q1600    Assessment: 88 yo male presenting to [facility] for generalized weakness. Patient did note falling PTA but denies head trauma. Patient is on warfarin PTA for history of atrial fibrillation and history of mitral valve repair.  PTA warfarin regimen is as follows: warfarin '5mg'$  PO daily except for 2.'5mg'$  PO on Saturdays. Last dose 02/12/22 at 2200. Pharmacy is consulted to resume warfarin.  Today, 02/13/22 - INR therapeutic at 2.7  - CBC stable - No significant DDI noted  Goal of Therapy:  INR 2-3 per clinic notes Monitor platelets by anticoagulation protocol: Yes   Plan:  Warfarin '5mg'$  PO x1 tonight Monitor INR and CBC daily  Dimple Nanas, PharmD, BCPS 02/13/2022 6:56 PM

## 2022-02-13 NOTE — ED Provider Notes (Addendum)
[facility] DEPT Provider Note   CSN: [id number] Arrival date & time: 02/13/22  1048     History  Chief Complaint  Patient presents with   Extremity Weakness    Daniel Sharp is a 88 y.o. male.  Patient is a 88 yo male with recent laparoscopic nephrectomy on 7/18 by Dr. Lovena Neighbours presenting for generalized weakness. Patient's baseline is walking with a walking, able to walk to the kitchen and back to the couch. States since his nephrectomy he has had decreased oral intake. No abdominal pain or nausea. Admits to low grade temperatures at the house < 100F. Denies chills, body aches, coughing, nausea, vomiting, abdominal pain, diarrhea, or dysuria. Denies difficulty urinating. Denies chest pain or sob. Denies black or bloody stools. Admits to worsening generalized weakness. Today was unable to support his body weight and slid down the wall to the floor. Denies head trauma.   The history is provided by the patient. No language interpreter was used.  Extremity Weakness Pertinent negatives include no chest pain, no abdominal pain and no shortness of breath.       Home Medications Prior to Admission medications   Medication Sig Start Date End Date Taking? Authorizing Provider  atenolol (TENORMIN) 25 MG tablet Take 25 mg by mouth 2 (two) times daily.    [provider]  benazepril (LOTENSIN) 40 MG tablet Take 40 mg by mouth daily.    [provider]  Cholecalciferol (VITAMIN D3 PO) Take 1 tablet by mouth every other day.    [provider]  docusate sodium (COLACE) 100 MG capsule Take 1 capsule (100 mg total) by mouth 2 (two) times daily. Patient not taking: Reported on 02/11/2022 01/22/22   Debbrah Alar, PA-C  furosemide (LASIX) 20 MG tablet Take 1 tablet (20 mg total) by mouth daily for 5 days. 01/27/22 02/11/22  Luna Fuse, MD  glucosamine-chondroitin 500-400 MG tablet Take 1 tablet by mouth daily.    [provider]   HYDROcodone-acetaminophen (NORCO/VICODIN) 5-325 MG tablet Take 1-2 tablets by mouth every 6 (six) hours as needed for moderate pain or severe pain. 01/22/22   Debbrah Alar, PA-C  nicotine polacrilex (COMMIT) 2 MG lozenge Take 2 mg by mouth as needed for smoking cessation.    [provider]  predniSONE (DELTASONE) 5 MG tablet Take 2 tablets (10 mg total) by mouth daily. 02/11/22   Narda Amber K, DO  promethazine (PHENERGAN) 12.5 MG tablet Take 1 tablet (12.5 mg total) by mouth every 4 (four) hours as needed for nausea or vomiting. 01/22/22   Debbrah Alar, PA-C  pyridostigmine (MESTINON) 60 MG tablet Take twice daily as need. 02/11/22   Narda Amber K, DO  warfarin (COUMADIN) 5 MG tablet Take 2.5-5 mg by mouth daily. Take 2.5 mg by mouth on Tuesday and Saturday, take 5 mg by mouth on Monday, Wednesday, Thursday, Friday and Sunday.    [provider]      Allergies    Mestinon [pyridostigmine bromide]    Review of Systems   Review of Systems  Constitutional:  Negative for chills and fever.  HENT:  Negative for ear pain and sore throat.   Eyes:  Negative for pain and visual disturbance.  Respiratory:  Negative for cough and shortness of breath.   Cardiovascular:  Negative for chest pain and palpitations.  Gastrointestinal:  Negative for abdominal pain and vomiting.  Genitourinary:  Negative for dysuria and hematuria.  Musculoskeletal:  Positive for extremity weakness. Negative for  arthralgias and back pain.  Skin:  Negative for color change and rash.  Neurological:  Positive for weakness. Negative for seizures and syncope.  All other systems reviewed and are negative.   Physical Exam Updated Vital Signs BP 129/82   Pulse 61   Temp 98.4 F (36.9 C) (Oral)   Resp (!) 23   Ht '6\' 1"'$  (1.854 m)   Wt 89.8 kg   SpO2 96%   BMI 26.12 kg/m  Physical Exam Vitals and nursing note reviewed.  Constitutional:      General: He is not in acute distress.    Appearance: He is  well-developed.  HENT:     Head: Normocephalic and atraumatic.  Eyes:     Conjunctiva/sclera: Conjunctivae normal.  Cardiovascular:     Rate and Rhythm: Normal rate and regular rhythm.     Heart sounds: No murmur heard. Pulmonary:     Effort: Pulmonary effort is normal. No respiratory distress.     Breath sounds: Normal breath sounds.  Abdominal:     Palpations: Abdomen is soft.     Tenderness: There is no abdominal tenderness.  Musculoskeletal:        General: No swelling.     Cervical back: Neck supple.  Skin:    General: Skin is warm and dry.     Capillary Refill: Capillary refill takes less than 2 seconds.  Neurological:     General: No focal deficit present.     Mental Status: He is alert and oriented to person, place, and time.     GCS: GCS eye subscore is 4. GCS verbal subscore is 5. GCS motor subscore is 6.     Cranial Nerves: Cranial nerves 2-12 are intact.     Sensory: Sensation is intact.     Motor: Motor function is intact.     Coordination: Coordination is intact.  Psychiatric:        Mood and Affect: Mood normal.     ED Results / Procedures / Treatments   Labs (all labs ordered are listed, but only abnormal results are displayed) Labs Reviewed  PROTIME-INR - Abnormal; Notable for the following components:      Result Value   Prothrombin Time 28.1 (*)    INR 2.7 (*)    All other components within normal limits  APTT - Abnormal; Notable for the following components:   aPTT 40 (*)    All other components within normal limits  CBC WITH DIFFERENTIAL/PLATELET - Abnormal; Notable for the following components:   WBC 13.6 (*)    RBC 3.66 (*)    Hemoglobin 11.5 (*)    HCT 34.7 (*)    Neutro Abs 11.1 (*)    Monocytes Absolute 1.2 (*)    Abs Immature Granulocytes 0.08 (*)    All other components within normal limits  COMPREHENSIVE METABOLIC PANEL - Abnormal; Notable for the following components:   Potassium 3.3 (*)    Chloride 93 (*)    CO2 33 (*)     Glucose, Bld 115 (*)    BUN 53 (*)    Creatinine, Ser 1.72 (*)    Calcium 10.8 (*)    Total Bilirubin 1.5 (*)    GFR, Estimated 38 (*)    All other components within normal limits  URINALYSIS, ROUTINE W REFLEX MICROSCOPIC - Abnormal; Notable for the following components:   Color, Urine STRAW (*)    Hgb urine dipstick MODERATE (*)    All other components within normal limits  TROPONIN I (HIGH SENSITIVITY) - Abnormal; Notable for the following components:   Troponin I (High Sensitivity) 46 (*)    All other components within normal limits  TROPONIN I (HIGH SENSITIVITY) - Abnormal; Notable for the following components:   Troponin I (High Sensitivity) 39 (*)    All other components within normal limits  LACTIC ACID, PLASMA    EKG None  Radiology DG Chest 2 View  Result Date: 02/13/2022 CLINICAL DATA:  Weakness. EXAM: CHEST - 2 VIEW COMPARISON:  July 05, 2021. FINDINGS: Stable cardiomegaly. Status post cardiac valve repair. Mild central pulmonary vascular congestion is noted. No acute pulmonary disease is noted. Bony thorax is unremarkable. IMPRESSION: Stable cardiomegaly with mild central pulmonary vascular congestion. Electronically Signed   By: Marijo Conception M.D.   On: 02/13/2022 12:31    Procedures Procedures    Medications Ordered in ED Medications - No data to display  ED Course/ Medical Decision Making/ A&P                           Medical Decision Making Amount and/or Complexity of Data Reviewed Labs: ordered.  Risk Decision regarding hospitalization.   88 yo male with recent laparoscopic nephrectomy on 7/18 by Dr. Lovena Neighbours presenting for generalized weakness.  Patient is alert oriented x 3, GCS 15, alert, awake, with no neurovascular deficits.  Patient admits to generalized weakness and inability to stand or walk today.  Hypoxia No hypoglycemia No signs or symptoms of sepsis No anemia Has elevated creatinine from baseline which is normally around 1.5-1.6  currently 1.7 today.  UA demonstrates no urinary tract infection.  Patient mitts to decreased p.o. intake and dehydration after nephrectomy on 01/22/2022.  States he is otherwise urinating without difficulty.  Denies abdominal pain or back pain.  1 L IV fluids given.  Patient has a stable ECG with no ST segment changes.  Troponin is minimally elevated at 6.  Repeat troponin downtrending to 39.  Patient continues deny any chest pain or shortness of breath at this time.  Stable electrolytes and chest x-ray demonstrating cardiomegaly with pulmonary vascular congestion only.  Is not edematous on exam.  Minimal pitting edema, clear breath sounds, no pleural effusions on chest x-ray.  Ambulation attempted but patient is so weak that he cannot even stand at this tim. Recommended for admission at this time.  I spoke with admitting physician Dr. Olevia Bowens agrees to accept patient. Patient agreeable to plan.         Final Clinical Impression(s) / ED Diagnoses Final diagnoses:  Bradycardia  Other fatigue  Generalized weakness  Pulmonary vascular congestion  Elevated troponin  Cardiomegaly  Decreased oral intake  Dehydration  AKI (acute kidney injury) [facility])    Rx / DC Orders ED Discharge Orders     None         Lianne Cure, DO 32/95/18 8416    Lianne Cure, DO 60/63/01 6010    Lianne Cure, DO 93/23/55 2331

## 2022-02-14 ENCOUNTER — Observation Stay (HOSPITAL_BASED_OUTPATIENT_CLINIC_OR_DEPARTMENT_OTHER): Payer: Medicare HMO

## 2022-02-14 DIAGNOSIS — I517 Cardiomegaly: Secondary | ICD-10-CM | POA: Diagnosis not present

## 2022-02-14 DIAGNOSIS — R778 Other specified abnormalities of plasma proteins: Secondary | ICD-10-CM | POA: Diagnosis not present

## 2022-02-14 LAB — CBC
HCT: 30.8 % — ABNORMAL LOW (ref 39.0–52.0)
Hemoglobin: 9.9 g/dL — ABNORMAL LOW (ref 13.0–17.0)
MCH: 30.7 pg (ref 26.0–34.0)
MCHC: 32.1 g/dL (ref 30.0–36.0)
MCV: 95.4 fL (ref 80.0–100.0)
Platelets: 200 10*3/uL (ref 150–400)
RBC: 3.23 MIL/uL — ABNORMAL LOW (ref 4.22–5.81)
RDW: 13 % (ref 11.5–15.5)
WBC: 11.8 10*3/uL — ABNORMAL HIGH (ref 4.0–10.5)
nRBC: 0 % (ref 0.0–0.2)

## 2022-02-14 LAB — ECHOCARDIOGRAM COMPLETE
Area-P 1/2: 3.28 cm2
Height: 73 in
MV VTI: 2.4 cm2
S' Lateral: 4 cm
Weight: 3054.69 oz

## 2022-02-14 LAB — COMPREHENSIVE METABOLIC PANEL
ALT: 12 U/L (ref 0–44)
AST: 15 U/L (ref 15–41)
Albumin: 3.3 g/dL — ABNORMAL LOW (ref 3.5–5.0)
Alkaline Phosphatase: 51 U/L (ref 38–126)
Anion gap: 8 (ref 5–15)
BUN: 48 mg/dL — ABNORMAL HIGH (ref 8–23)
CO2: 33 mmol/L — ABNORMAL HIGH (ref 22–32)
Calcium: 10.4 mg/dL — ABNORMAL HIGH (ref 8.9–10.3)
Chloride: 97 mmol/L — ABNORMAL LOW (ref 98–111)
Creatinine, Ser: 1.56 mg/dL — ABNORMAL HIGH (ref 0.61–1.24)
GFR, Estimated: 43 mL/min — ABNORMAL LOW (ref >60)
Glucose, Bld: 174 mg/dL — ABNORMAL HIGH (ref 70–99)
Potassium: 3.6 mmol/L (ref 3.5–5.1)
Sodium: 138 mmol/L (ref 135–145)
Total Bilirubin: 1.2 mg/dL (ref 0.3–1.2)
Total Protein: 6.1 g/dL — ABNORMAL LOW (ref 6.5–8.1)

## 2022-02-14 LAB — PROTIME-INR
INR: 3.2 — ABNORMAL HIGH (ref 0.8–1.2)
Prothrombin Time: 32.1 seconds — ABNORMAL HIGH (ref 11.4–15.2)

## 2022-02-14 MED ORDER — AMLODIPINE BESYLATE 5 MG PO TABS
5.0000 mg | ORAL_TABLET | Freq: Every day | ORAL | Status: DC
  Administered 2022-02-14 – 2022-02-19 (×5): 5 mg via ORAL
  Filled 2022-02-14 (×6): qty 1

## 2022-02-14 MED ORDER — HYDRALAZINE HCL 25 MG PO TABS
25.0000 mg | ORAL_TABLET | Freq: Three times a day (TID) | ORAL | Status: DC
Start: 2022-02-14 — End: 2022-02-14

## 2022-02-14 MED ORDER — ATENOLOL 25 MG PO TABS
25.0000 mg | ORAL_TABLET | Freq: Two times a day (BID) | ORAL | Status: DC
  Administered 2022-02-14 – 2022-02-17 (×8): 25 mg via ORAL
  Filled 2022-02-14 (×9): qty 1

## 2022-02-14 NOTE — Evaluation (Signed)
Physical Therapy Evaluation Patient Details Name: Daniel Sharp MRN: [id number] DOB: 1932/11/28 Today's Date: 02/14/2022  History of Present Illness  Daniel Sharp is a 88 y.o. 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  Patient had been  ambulating in home with Rw, recent decline with a fall.  Patient  requiring  + 2 mod assistance to ambulate 20' x 2 with Rw, decreased step length RLE, listing to the right. Patient required support  for balance when turning   around to sit down to [facility] and to recliner, multimodal cues. Patient currently will require assistance for mobility, patient has  steps to enter home. Patient may benefit from SNF for rehab if patient and family agreeable.  Pt admitted with above diagnosis.  Pt currently with functional limitations due to the deficits listed below (see PT Problem List). Pt will benefit from skilled PT to increase their independence and safety with mobility to allow discharge to the venue listed below.      Recommendations for follow up therapy are one component of a multi-disciplinary discharge planning process, led by the attending physician.  Recommendations may be updated based on patient status, additional functional criteria and insurance authorization.  Follow Up Recommendations Skilled nursing-short term rehab (<3 hours/day) Can patient physically be transported by private vehicle: No    Assistance Recommended at Discharge Frequent or constant Supervision/Assistance  Patient can return home with the following  A lot of help with walking and/or transfers;A lot of help with  bathing/dressing/bathroom;Assistance with cooking/housework;Assist for transportation;Help with stairs or ramp for entrance    Equipment Recommendations None recommended by PT  Recommendations for Other Services       Functional Status Assessment Patient has had a recent decline in their functional status and demonstrates the ability to make significant improvements in function in a reasonable and predictable amount of time.     Precautions / Restrictions Precautions Precautions: Fall Restrictions Weight Bearing Restrictions: No      Mobility  Bed Mobility                    Transfers                   General transfer comment: Min-mod assist for steadying  to step to recliner.Marland Kitchen Heavy verbal cues for safety, use of walker, hand placements    Ambulation/Gait Ambulation/Gait assistance: Mod assist, +2 safety/equipment, +2 physical assistance, Max assist Gait Distance (Feet): 20 Feet (x 2) Assistive device: Rolling walker (2 wheels) Gait Pattern/deviations: Step-to pattern, Decreased stance time - right, Decreased step length - right       General Gait Details: unsteady gait, listing to right decreased R step, dragging right leg. facilitated right leg to increase step length to improve balance/safety  Stairs            Wheelchair Mobility    Modified Rankin (Stroke Patients Only)       Balance Overall balance assessment: History of Falls, Needs assistance Sitting-balance support: No upper extremity supported, Feet supported Sitting balance-Leahy Scale: Poor Sitting balance - Comments: listing to the  right seated   Standing balance support: During functional activity, Reliant on assistive device  for balance Standing balance-Leahy Scale: Poor                               Pertinent Vitals/Pain Pain Assessment Pain Assessment: Faces Faces Pain Scale: Hurts even more Pain Location: L shoulder/hematoma Pain Descriptors /  Indicators: Grimacing, Discomfort Pain Intervention(s): Monitored during session    Home Living Family/patient expects to be discharged to:: Private residence Living Arrangements: Alone;Children (Will live with son after surgery) Available Help at Discharge: Family;Available 24 hours/day Type of Home: House Home Access: Stairs to enter Entrance Stairs-Rails: Left;Right (Lt only garage) Entrance Stairs-Number of Steps: 4 at back, 5 in garage   [place]: One level;Able to live on main level with bedroom/bathroom Home Equipment: Wheelchair - Publishing copy (2 wheels);Cane - single TEFL teacher (4 wheels) (rollator in [place], [place] in [place].)      Prior Function Prior Level of Function : Independent/Modified Independent             Mobility Comments: has been using a walker since getting a kidney removed ADLs Comments: grossly was independent     Hand Dominance   Dominant Hand: Right    Extremity/Trunk Assessment   Upper Extremity Assessment Upper Extremity Assessment: RUE deficits/detail;LUE deficits/detail RUE Deficits / Details: WFL ROM, grossly 5/5 strength - large hematoma on psoterior shoulder from fall. RUE Sensation: WNL RUE Coordination: WNL LUE Deficits / Details: WFL ROM, grossly 5/5 strength LUE Sensation: WNL LUE Coordination: WNL    Lower Extremity Assessment Lower Extremity Assessment: Generalized weakness    Cervical / Trunk Assessment Cervical / Trunk Assessment: Normal  Communication   Communication: No difficulties  Cognition Arousal/Alertness: Awake/alert Behavior During Therapy: WFL for tasks assessed/performed Overall Cognitive Status: Within Functional Limits for tasks assessed                                          General Comments      Exercises     Assessment/Plan    PT Assessment Patient needs continued PT services  PT Problem List Decreased strength;Decreased activity tolerance;Decreased  balance;Decreased mobility;Decreased knowledge of use of DME;Decreased knowledge of precautions;Decreased cognition       PT Treatment Interventions DME instruction;Gait training;Stair training;Therapeutic activities;Functional mobility training;Therapeutic exercise;Neuromuscular re-education;Balance training;Patient/family education    PT Goals (Current goals can be found in the Care Plan section)  Acute Rehab PT Goals Patient Stated Goal: agreed to ambulate PT Goal Formulation: With patient/family Time For Goal Achievement: 02/28/22 Potential to Achieve Goals: Fair    Frequency Min 2X/week     Co-evaluation PT/OT/SLP Co-Evaluation/Treatment: Yes Reason for Co-Treatment: For patient/therapist safety;To address functional/ADL transfers PT goals addressed during session: Mobility/safety with mobility OT goals addressed during session: ADL's and self-care       AM-PAC PT "6 Clicks" Mobility  Outcome Measure Help needed turning from your back to your side while in a flat bed without using bedrails?: A Lot Help needed moving from lying on your back to sitting on the side of a flat bed without using bedrails?: A Lot Help needed moving to and from a bed to a chair (including a wheelchair)?: A Lot Help needed standing up from a chair using your arms (e.g., wheelchair or bedside chair)?: A Lot Help needed to walk in hospital room?: A Lot Help needed climbing 3-5 steps with a railing? : Total  6 Click Score: 11    End of Session Equipment Utilized During Treatment: Gait belt Activity Tolerance: Patient limited by fatigue;Patient tolerated treatment well Patient left: in chair;with call bell/phone within reach;with chair alarm set;with family/visitor present Nurse Communication: Mobility status PT Visit Diagnosis: Muscle weakness (generalized) (M62.81);Difficulty in walking, not elsewhere classified (R26.2);Unsteadiness on feet (R26.81);History of falling (Z91.81)    Time:  1137-1205 PT Time Calculation (min) (ACUTE ONLY): 28 min   Charges:   PT Evaluation $PT Eval Low Complexity: 1 Low          [place] Office [phone] Weekend [phone]   Claretha Cooper 02/14/2022, 2:40 PM

## 2022-02-14 NOTE — Progress Notes (Signed)
PROGRESS NOTE  Daniel Sharp  SAY:[id number] DOB: 1932-10-15 DOA: 02/13/2022 PCP: [name], MD   Brief Narrative:  Patient is 88 year old male with history of paroxysmal A-fib on warfarin, osteoarthritis, mitral valve repair, hypertension, ocular myasthenia gravis who presented with complaint of bilateral lower extremity weakness.  On presentation, he was hemodynamically stable.  Patient was started on IV fluids.  Lab work showed white cell count of 13.6.  Troponins was elevated at 46.  Also found to have potassium of 3.3, creatinine of 1.7.  Patient was admitted for further management.    Assessment & Plan:  Principal Problem:   Elevated troponin Active Problems:   H/O mitral valve replacement   Renal cell carcinoma of left kidney (HCC)   Hypertension   Hypercholesterolemia   Longstanding persistent atrial fibrillation (HCC)   Generalized weakness   Hypercalcemia   Hyperbilirubinemia   Generalized weakness: History of myasthenia gravis.  Could be exacerbation.  Takes prednisone at home 10 mg daily which we will continue.  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  Continue monitoring.  Currently kidney function might be at baseline.  Elevated troponin: Denies any chest pain.  EKG did not show any significant ischemic changes.  Troponin is elevated but flat trended.  Echocardiogram showed EF of 55%, no obvious wall motion abnormality.  Paroxysmal A-fib: Remains in A-fib, rate controlled.  Monitor on telemetry.  On warfarin for anticoagulation.  On atenolol for rate control  History of mitral valve replacement: On warfarin.  Monitor INR.  Goal is 2.5-3.5.  Echo showed functioning mitral valve  Hypertension: Takes benazepril, atenolol at home.  Benazepril hold due to AKI/CKD.  Added amlodipine 5 mg daily.  History of renal cell carcinoma of left kidney: Status  post nephrectomy.  Leukocytosis: Mild.  Most likely reactive.  Continue to monitor  Normocytic anemia: hemoglobin currently stable in the range of 9-11.        DVT prophylaxis:warfarin     Code Status: Full Code  Family Communication: Called and discussed with son on 8/10  Patient status:Inpatient  Patient is from :Home  Anticipated discharge AT:[facility]  Estimated DC date: 1 to 2 days, pending PT evaluation   Consultants: None  Procedures: None  Antimicrobials:  Anti-infectives (From admission, onward)    None       Subjective: Patient seen and examined at the bedside this morning.  Hemodynamically stable.  Alert and oriented, lying in bed, appears overall comfortable.  Denies any new complaints.  Appears weak/deconditioned  Objective: Vitals:   02/13/22 1902 02/13/22 1955 02/13/22 2346 02/14/22 0410  BP:  (!) 175/88 (!) 147/58 (!) 153/77  Pulse:  65 63 66  Resp:  '20 19 18  '$ Temp: 98.5 F (36.9 C) 99.5 F (37.5 C) 99 F (37.2 C) 99.1 F (37.3 C)  TempSrc: Oral Oral Oral Oral  SpO2:  94% 93% 94%  Weight:  86.6 kg    Height:  '6\' 1"'$  (1.854 m)      Intake/Output Summary (Last 24 hours) at 02/14/2022 2202 Last data filed at 02/14/2022 5427 Gross per 24 hour  Intake 240 ml  Output 500 ml  Net -260 ml   Filed Weights   02/13/22 1108 02/13/22 1955  Weight: 89.8 kg 86.6 kg    Examination:  General exam: Overall comfortable, not in distress, pleasant elderly male, weak HEENT: PERRL Respiratory system:  no wheezes or  crackles  Cardiovascular system: S1 & S2 heard, irregularly irregular rhythm Gastrointestinal system: Abdomen is nondistended, soft and nontender. Central nervous system: Alert and oriented Extremities: No edema, no clubbing ,no cyanosis Skin: No rashes, no ulcers,no icterus     Data Reviewed: I have personally reviewed following labs and imaging studies  CBC: Recent Labs  Lab 02/13/22 1150 02/14/22 0535  WBC 13.6* 11.8*  NEUTROABS  11.1*  --   HGB 11.5* 9.9*  HCT 34.7* 30.8*  MCV 94.8 95.4  PLT 254 482   Basic Metabolic Panel: Recent Labs  Lab 02/13/22 1150 02/14/22 0535  NA 137 138  K 3.3* 3.6  CL 93* 97*  CO2 33* 33*  GLUCOSE 115* 174*  BUN 53* 48*  CREATININE 1.72* 1.56*  CALCIUM 10.8* 10.4*     No results found for this or any previous visit (from the past 240 hour(s)).   Radiology Studies: DG Chest 2 View  Result Date: 02/13/2022 CLINICAL DATA:  Weakness. EXAM: CHEST - 2 VIEW COMPARISON:  July 05, 2021. FINDINGS: Stable cardiomegaly. Status post cardiac valve repair. Mild central pulmonary vascular congestion is noted. No acute pulmonary disease is noted. Bony thorax is unremarkable. IMPRESSION: Stable cardiomegaly with mild central pulmonary vascular congestion. Electronically Signed   By: Marijo Conception M.D.   On: 02/13/2022 12:31    Scheduled Meds:  feeding supplement  237 mL Oral BID BM   predniSONE  10 mg Oral Daily   pyridostigmine  60 mg Oral Q8H   tamsulosin  0.4 mg Oral Daily   Warfarin - Pharmacist Dosing Inpatient   Does not apply q1600   Continuous Infusions:   LOS: 0 days   Shelly Coss, MD [facility] P8/04/2022, 8:37 AM

## 2022-02-14 NOTE — Progress Notes (Signed)
[place] for Warfarin Indication: history of atrial fibrillation and mitral valve repair  Allergies  Allergen Reactions   Mestinon [Pyridostigmine Bromide] Diarrhea    Patient Measurements: Height: '6\' 1"'$  (185.4 cm) Weight: 86.6 kg (190 lb 14.7 oz) IBW/kg (Calculated) : 79.9  Vital Signs: Temp: 98.8 F (37.1 C) (08/10 0902) Temp Source: Oral (08/10 0902) BP: 165/72 (08/10 0902) Pulse Rate: 68 (08/10 0902)  Labs: Recent Labs    02/13/22 1150 02/13/22 1433 02/14/22 0535  HGB 11.5*  --  9.9*  HCT 34.7*  --  30.8*  PLT 254  --  200  APTT 40*  --   --   LABPROT 28.1*  --  32.1*  INR 2.7*  --  3.2*  CREATININE 1.72*  --  1.56*  TROPONINIHS 46* 39*  --     Estimated Creatinine Clearance: 38.4 mL/min (A) (by C-G formula based on SCr of 1.56 mg/dL (H)).   Medications:  PTA warfarin (5 mg daily except 2.5 mg on Saturday)  Assessment: 41 YOM with myasthenia gravis presenting to ED for generalized weakness. Patient noted falling PTA but denies head trauma. Patient is on warfarin PTA for hx of afib and hx mitral valve repair. Pharmacy is dosing warfarin inpatient.  Today, 02/14/22: - Received warfarin 5 mg yesterday at 2000 - INR supratherapeutic at 3.2 - Hgb downtrending (9.9 today) - Plt WNL - No bleeding documented - No significant DDIs - Documented as consuming 20% of last meal  Goal of Therapy:  INR 2-3 per clinic Monitor platelets by anticoagulation protocol: Yes   Plan:  - Hold warfarin today - F/u INR and CBC tomorrow  Cannon Kettle, PharmD Candidate 02/14/2022 11:24 AM

## 2022-02-14 NOTE — Evaluation (Addendum)
Occupational Therapy Evaluation Patient Details Name: Daniel Sharp MRN: [id number] DOB: Oct 04, 1932 Today's Date: 02/14/2022   History of Present Illness Daniel Sharp is a 88 y.o. male with medical history significant of paroxysmal atrial fibrillation on warfarin, osteoarthritis, dyspnea, mitral valve repair, hypertension, history of pneumonia, ocular myasthenia gravis who is coming to the emergency department due to bilateral lower extremity weakness since this morning.   Clinical Impression   [name] is an 88 year old man admitted to hospital with above medical history and presents with generalized weakness, decreased activity tolerance, and impaired balance . At baseline he is predominantly independent with ADLs and in home ambulation. He has been using a walker for two weeks since his surgery. Patient needing min-mod assist to ambulate with walker with +2 for safety. He needed heavy verbal cues for use of walker, he began to drag his right leg and list to the right and presents as a high fall risk. He needs increased assistance with ADLs including LB ADLs and toileting. Patient will benefit from skilled OT services while in hospital to improve deficits and learn compensatory strategies as needed in order to return to PLOF.         Recommendations for follow up therapy are one component of a multi-disciplinary discharge planning process, led by the attending physician.  Recommendations may be updated based on patient status, additional functional criteria and insurance authorization.   Follow Up Recommendations  Skilled nursing-short term rehab (<3 hours/day)    Assistance Recommended at Discharge Frequent or constant Supervision/Assistance  Patient can return home with the following A little help with walking and/or transfers;A little help with bathing/dressing/bathroom;Assistance with cooking/housework;Help with stairs or ramp for entrance    Functional Status Assessment  Patient has  had a recent decline in their functional status and demonstrates the ability to make significant improvements in function in a reasonable and predictable amount of time.  Equipment Recommendations  Tub/shower seat    Recommendations for Other Services       Precautions / Restrictions Precautions Precautions: Fall Restrictions Weight Bearing Restrictions: No      Mobility Bed Mobility Overal bed mobility: Needs Assistance Bed Mobility: Supine to Sit     Supine to sit: Mod assist, HOB elevated     General bed mobility comments: Mod assist to sit up and stabilize at edge of bed. needing initail assistance to maitnain balance. Patient falling to the right.    Transfers Overall transfer level: Needs assistance Equipment used: Rolling walker (2 wheels) Transfers: Sit to/from Stand, Bed to chair/wheelchair/BSC Sit to Stand: Min assist, +2 safety/equipment           General transfer comment: Min-mod assist for steadying and walker management to and from bathroom. Heavy verbal cues for safety, use of walker, hand placements      Balance Overall balance assessment: Needs assistance Sitting-balance support: No upper extremity supported, Feet supported Sitting balance-Leahy Scale: Poor     Standing balance support: During functional activity, Reliant on assistive device for balance Standing balance-Leahy Scale: Poor                             ADL either performed or assessed with clinical judgement   ADL Overall ADL's : Needs assistance/impaired Eating/Feeding: Independent   Grooming: Set up;Sitting   Upper Body Bathing: Set up;Sitting   Lower Body Bathing: Minimal assistance;Sit to/from stand   Upper Body Dressing : Set up;Sitting  Lower Body Dressing: Minimal assistance;Sit to/from stand Lower Body Dressing Details (indicate cue type and reason): able to don socks, needs assistance with pulling clothing up Toilet Transfer: Minimal assistance;+2 for  safety/equipment;Regular Toilet;Grab bars;Cueing for safety Toilet Transfer Details (indicate cue type and reason): Patient walked to bathroom with walker with +2 for safety with heavy verbal cues for safety. patient dragging right leg and leaning to the right. Toileting- Clothing Manipulation and Hygiene: Moderate assistance;Sit to/from stand Toileting - Clothing Manipulation Details (indicate cue type and reason): for clothing management. able to wipe in seated position     Functional mobility during ADLs: Moderate assistance;+2 for safety/equipment       Vision Patient Visual Report: No change from baseline Additional Comments: intermittent and fluctuating right > left eyelid droopiness and double vision.     Perception     Praxis      Pertinent Vitals/Pain Pain Assessment Pain Assessment: No/denies pain     Hand Dominance Right   Extremity/Trunk Assessment Upper Extremity Assessment Upper Extremity Assessment: RUE deficits/detail;LUE deficits/detail RUE Deficits / Details: WFL ROM, grossly 5/5 strength - large hematoma on psoterior shoulder from fall. RUE Sensation: WNL RUE Coordination: WNL LUE Deficits / Details: WFL ROM, grossly 5/5 strength LUE Sensation: WNL LUE Coordination: WNL   Lower Extremity Assessment Lower Extremity Assessment: Defer to PT evaluation   Cervical / Trunk Assessment Cervical / Trunk Assessment: Normal   Communication Communication Communication: No difficulties   Cognition Arousal/Alertness: Awake/alert Behavior During Therapy: WFL for tasks assessed/performed Overall Cognitive Status: Within Functional Limits for tasks assessed                                       General Comments       Exercises     Shoulder Instructions      Home Living Family/patient expects to be discharged to:: Private residence Living Arrangements: Alone;Children (Will live with son after surgery) Available Help at Discharge:  Family;Available 24 hours/day Type of Home: House Home Access: Stairs to enter [employer] of Steps: 4 at back, 5 in garage Entrance Stairs-Rails: Left;Right (Lt only garage) Home Layout: One level;Able to live on main level with bedroom/bathroom     Bathroom Shower/Tub: Walk-in shower (roll in shower)   Bathroom Toilet: Handicapped height Bathroom Accessibility: Yes   Home Equipment: [place] (2 wheels);Cane - single TEFL teacher (4 wheels) (rollator in [place] in [place]          Prior Functioning/Environment Prior Level of Function : Independent/Modified Independent             Mobility Comments: has been using a walker since getting a kidney removed ADLs Comments: grossly was independent        OT Problem List: Decreased strength;Decreased activity tolerance;Impaired balance (sitting and/or standing);Decreased safety awareness;Decreased knowledge of use of DME or AE;Decreased knowledge of precautions      OT Treatment/Interventions: Self-care/ADL training;Therapeutic exercise;DME and/or AE instruction;Therapeutic activities;Balance training;Patient/family education;Neuromuscular education    OT Goals(Current goals can be found in the care plan section) Acute Rehab OT Goals Patient Stated Goal: return to independence OT Goal Formulation: With family Time For Goal Achievement: 02/28/22 Potential to Achieve Goals: Good ADL Goals Pt Will Perform Lower Body Dressing: with supervision;sit to/from stand Pt Will Transfer to Toilet: with supervision;ambulating;regular height toilet Pt Will Perform Toileting - Clothing Manipulation and hygiene: with supervision;sit to/from stand  OT  Frequency: Min 2X/week    Co-evaluation PT/OT/SLP Co-Evaluation/Treatment: Yes (co-eval)   PT goals addressed during session: Mobility/safety with mobility OT goals addressed during session: ADL's and self-care      AM-PAC OT "6 Clicks"  Daily Activity     Outcome Measure Help from another person eating meals?: None Help from another person taking care of personal grooming?: A Little Help from another person toileting, which includes using toliet, bedpan, or urinal?: A Little Help from another person bathing (including washing, rinsing, drying)?: A Little Help from another person to put on and taking off regular upper body clothing?: A Little Help from another person to put on and taking off regular lower body clothing?: A Little 6 Click Score: 19   End of Session Equipment Utilized During Treatment: Gait belt;Rolling walker (2 wheels) Nurse Communication: Mobility status  Activity Tolerance: Patient tolerated treatment well Patient left: in chair;with chair alarm set  OT Visit Diagnosis: Unsteadiness on feet (R26.81);Muscle weakness (generalized) (M62.81)                Time: 2585-2778 OT Time Calculation (min): 31 min Charges:  OT General Charges $OT Visit: 1 Visit OT Evaluation $OT Eval Low Complexity: 1 Low  Tresia Revolorio, OTR/L [place]  Office [phone] Pager: [place] 02/14/2022, 12:47 PM

## 2022-02-14 NOTE — Progress Notes (Signed)
Echocardiogram 2D Echocardiogram has been performed.  Oneal Deputy Shahad Mazurek RDCS 02/14/2022, 9:00 AM

## 2022-02-15 DIAGNOSIS — R778 Other specified abnormalities of plasma proteins: Secondary | ICD-10-CM | POA: Diagnosis not present

## 2022-02-15 LAB — BASIC METABOLIC PANEL
Anion gap: 7 (ref 5–15)
BUN: 46 mg/dL — ABNORMAL HIGH (ref 8–23)
CO2: 33 mmol/L — ABNORMAL HIGH (ref 22–32)
Calcium: 10.4 mg/dL — ABNORMAL HIGH (ref 8.9–10.3)
Chloride: 99 mmol/L (ref 98–111)
Creatinine, Ser: 1.54 mg/dL — ABNORMAL HIGH (ref 0.61–1.24)
GFR, Estimated: 44 mL/min — ABNORMAL LOW (ref >60)
Glucose, Bld: 107 mg/dL — ABNORMAL HIGH (ref 70–99)
Potassium: 3.7 mmol/L (ref 3.5–5.1)
Sodium: 139 mmol/L (ref 135–145)

## 2022-02-15 LAB — CBC
HCT: 29.3 % — ABNORMAL LOW (ref 39.0–52.0)
Hemoglobin: 9.7 g/dL — ABNORMAL LOW (ref 13.0–17.0)
MCH: 31.5 pg (ref 26.0–34.0)
MCHC: 33.1 g/dL (ref 30.0–36.0)
MCV: 95.1 fL (ref 80.0–100.0)
Platelets: 212 10*3/uL (ref 150–400)
RBC: 3.08 MIL/uL — ABNORMAL LOW (ref 4.22–5.81)
RDW: 12.8 % (ref 11.5–15.5)
WBC: 13 10*3/uL — ABNORMAL HIGH (ref 4.0–10.5)
nRBC: 0 % (ref 0.0–0.2)

## 2022-02-15 LAB — PROTIME-INR
INR: 3 — ABNORMAL HIGH (ref 0.8–1.2)
Prothrombin Time: 31.2 seconds — ABNORMAL HIGH (ref 11.4–15.2)

## 2022-02-15 MED ORDER — WARFARIN SODIUM 5 MG PO TABS
5.0000 mg | ORAL_TABLET | Freq: Once | ORAL | Status: AC
Start: 2022-02-15 — End: 2022-02-15
  Administered 2022-02-15: 5 mg via ORAL
  Filled 2022-02-15: qty 1

## 2022-02-15 NOTE — NC FL2 (Signed)
[place] LEVEL OF CARE SCREENING TOOL     IDENTIFICATION  Patient Name: Daniel Sharp Birthdate: March 31, 1933 Sex: male Admission Date (Current Location): 02/13/2022  [facility] and [place] Number:  Herbalist and Address:  [facility],  [place], [place]      Provider Number: [id number]  Attending Physician Name and Address:  Shelly Coss, MD  Relative Name and Phone Number:  Nicolaos, [place] [phone]    Current Level of Care: Hospital Recommended Level of Care: [place] Prior Approval Number:    Date Approved/Denied: 02/15/22 PASRR Number: [id number]  Discharge Plan: SNF    Current Diagnoses: Patient Active Problem List   Diagnosis Date Noted   Elevated troponin 02/13/2022   H/O mitral valve repair 02/13/2022   Generalized weakness 02/13/2022   Hypercalcemia 02/13/2022   Hyperbilirubinemia 02/13/2022   Renal cell carcinoma of left kidney ([place]) 01/30/2022   Myasthenia gravis without acute exacerbation ([place]) 01/30/2022   Renal mass 01/22/2022   H/O mitral valve replacement 07/11/2021   Localized edema 07/11/2021   Overweight with body mass index (BMI) 25.0-29.9 11/23/2018   Longstanding persistent atrial fibrillation ([place]) 03/28/2012   Hypertension 03/09/2009   Hypercholesterolemia 03/09/2009    Orientation RESPIRATION BLADDER Height & Weight     Self, Time, Situation, Place  Normal Continent Weight: 190 lb 14.7 oz (86.6 kg) Height:  '6\' 1"'$  (185.4 cm)  BEHAVIORAL SYMPTOMS/MOOD NEUROLOGICAL BOWEL NUTRITION STATUS      Continent Diet (See DC summary)  AMBULATORY STATUS COMMUNICATION OF NEEDS Skin   Extensive Assist Verbally Skin abrasions, Surgical wounds                       Personal Care Assistance Level of Assistance  Bathing, Feeding, Dressing Bathing Assistance: Limited assistance Feeding assistance: Independent Dressing Assistance: Limited assistance     Functional  Limitations Info  Sight, Hearing, Speech Sight Info: Impaired (R/L wears glasses) Hearing Info: Impaired Speech Info: Adequate    SPECIAL CARE FACTORS FREQUENCY  PT (By licensed PT), OT (By licensed OT)     PT Frequency: 5x/week OT Frequency: 5x/week            Contractures Contractures Info: Present    Additional Factors Info  Code Status, Allergies Code Status Info: Full Allergies Info: Pyridostigmine Bromide           Current Medications (02/15/2022):  This is the current hospital active medication list Current Facility-Administered Medications  Medication Dose Route Frequency Provider Last Rate Last Admin   acetaminophen (TYLENOL) tablet 650 mg  650 mg Oral Q6H PRN Reubin Milan, MD       Or   acetaminophen (TYLENOL) suppository 650 mg  650 mg Rectal Q6H PRN Reubin Milan, MD       amLODipine (NORVASC) tablet 5 mg  5 mg Oral Daily Shelly Coss, MD   5 mg at 02/15/22 0957   atenolol (TENORMIN) tablet 25 mg  25 mg Oral BID Shelly Coss, MD   25 mg at 02/15/22 0957   diphenoxylate-atropine (LOMOTIL) 2.5-0.025 MG per tablet 1 tablet  1 tablet Oral QID PRN Reubin Milan, MD       feeding supplement (ENSURE ENLIVE / ENSURE PLUS) liquid 237 mL  237 mL Oral BID BM Reubin Milan, MD   237 mL at 02/15/22 0959   HYDROcodone-acetaminophen (NORCO/VICODIN) 5-325 MG per tablet 1 tablet  1 tablet Oral Q6H PRN Reubin Milan,  MD       ondansetron (ZOFRAN) tablet 4 mg  4 mg Oral Q6H PRN Reubin Milan, MD       Or   ondansetron [facility]) injection 4 mg  4 mg Intravenous Q6H PRN Reubin Milan, MD       predniSONE (DELTASONE) tablet 10 mg  10 mg Oral Daily Reubin Milan, MD   10 mg at 02/15/22 0957   pyridostigmine (MESTINON) tablet 60 mg  60 mg Oral Q8H Reubin Milan, MD   60 mg at 02/15/22 0602   tamsulosin (FLOMAX) capsule 0.4 mg  0.4 mg Oral Daily Reubin Milan, MD   0.4 mg at 02/15/22 8250   Warfarin - Pharmacist Dosing  Inpatient   Does not apply [place], [place], [facility]         Discharge Medications: Please see discharge summary for a list of discharge medications.  Relevant Imaging Results:  Relevant Lab Results:   Additional Information SSN:[id number]  Servando Snare, LCSW

## 2022-02-15 NOTE — Progress Notes (Signed)
PROGRESS NOTE  Daniel Sharp  ONG:[id number] DOB: 1933-06-13 DOA: 02/13/2022 PCP: [name], MD   Brief Narrative:  Patient is 88 year old male with history of paroxysmal A-fib on warfarin, osteoarthritis, mitral valve repair, hypertension, ocular myasthenia gravis who presented with complaint of bilateral lower extremity weakness.  On presentation, he was hemodynamically stable.  Patient was started on IV fluids.  Lab work showed white cell count of 13.6.  Troponins was elevated at 46.  Also found to have potassium of 3.3, creatinine of 1.7.  Patient was admitted for further management.  PT/OT recommending skilled nursing facility on discharge.  His weakness has improved.  Medically stable for discharge whenever possible.  Assessment & Plan:  Principal Problem:   Elevated troponin Active Problems:   H/O mitral valve replacement   Renal cell carcinoma of left kidney (HCC)   Hypertension   Hypercholesterolemia   Longstanding persistent atrial fibrillation (HCC)   Generalized weakness   Hypercalcemia   Hyperbilirubinemia   Generalized weakness: History of myasthenia gravis.  Could be exacerbation.  Takes prednisone at home 10 mg daily which we will continue.  Mestinon started by admitting physician.  Will change to as needed as an discharge.  We recommend to follow-up with neurology as an outpatient. 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  Continue monitoring.  Currently kidney function might be at baseline.  Elevated troponin: Denies any chest pain.  EKG did not show any significant ischemic changes.  Troponin is elevated but flat trended.  Echocardiogram showed EF of 55%, no obvious wall motion abnormality.  Paroxysmal A-fib: Remains in A-fib, rate controlled.  Monitor on telemetry.  On warfarin for anticoagulation.  On atenolol for rate control  History of mitral valve replacement:  On warfarin.  Monitor INR.  Goal is 2.5-3.5.  Echo showed functioning mitral valve  Hypertension: Takes benazepril, atenolol at home.  Benazepril hold due to AKI/CKD.  Added amlodipine 5 mg daily.  History of renal cell carcinoma of left kidney: Status post nephrectomy.  Leukocytosis: Mild.  Most likely reactive.  Continue to monitor  Normocytic anemia: hemoglobin currently stable in the range of 9-11.        DVT prophylaxis:warfarin     Code Status: Full Code  Family Communication: Called and discussed with son on 8/10  Patient status:Inpatient  Patient is from :Home  Anticipated discharge GM:[facility]  Estimated DC date: 1 to 2 days, pending PT evaluation   Consultants: None  Procedures: None  Antimicrobials:  Anti-infectives (From admission, onward)    None       Subjective: Patient seen and examined at the bedside this morning.  Hemodynamically stable.  Alert and oriented, lying in bed, appears overall comfortable.  Denies any new complaints.  Appears weak/deconditioned  Objective: Vitals:   02/14/22 0902 02/14/22 1344 02/14/22 2232 02/15/22 0611  BP: (!) 165/72 133/67 127/66 (!) 146/66  Pulse: 68 (!) 56 (!) 58 60  Resp: '16 16 18 20  '$ Temp: 98.8 F (37.1 C) 99 F (37.2 C) 98.7 F (37.1 C) 98 F (36.7 C)  TempSrc: Oral Oral Oral Oral  SpO2: 97%  91% 93%  Weight:      Height:        Intake/Output Summary (Last 24 hours) at 02/15/2022 1307 Last data filed at 02/15/2022 7253 Gross per 24 hour  Intake --  Output 850 ml  Net -850 ml   [employer]  02/13/22 1108 02/13/22 1955  Weight: 89.8 kg 86.6 kg    Examination:  General exam: Overall comfortable, not in distress,weak HEENT: PERRL Respiratory system:  no wheezes or crackles  Cardiovascular system: S1 & S2 heard,irregularly irregular rhythm Gastrointestinal system: Abdomen is nondistended, soft and nontender. Central nervous system: Alert and oriented Extremities: No edema, no clubbing ,no  cyanosis Skin: No rashes, no ulcers,no icterus       Data Reviewed: I have personally reviewed following labs and imaging studies  CBC: Recent Labs  Lab 02/13/22 1150 02/14/22 0535 02/15/22 0717  WBC 13.6* 11.8* 13.0*  NEUTROABS 11.1*  --   --   HGB 11.5* 9.9* 9.7*  HCT 34.7* 30.8* 29.3*  MCV 94.8 95.4 95.1  PLT 254 200 712   Basic Metabolic Panel: Recent Labs  Lab 02/13/22 1150 02/14/22 0535 02/15/22 0717  NA 137 138 139  K 3.3* 3.6 3.7  CL 93* 97* 99  CO2 33* 33* 33*  GLUCOSE 115* 174* 107*  BUN 53* 48* 46*  CREATININE 1.72* 1.56* 1.54*  CALCIUM 10.8* 10.4* 10.4*     No results found for this or any previous visit (from the past 240 hour(s)).   Radiology Studies: ECHOCARDIOGRAM COMPLETE  Result Date: 02/14/2022    ECHOCARDIOGRAM REPORT   Patient Name:   Daniel Sharp Date of Exam: 02/14/2022 Medical Rec #:  [id number]   Height:       73.0 in Accession #:    [id number]  Weight:       190.9 lb Date of Birth:  08-04-1932   BSA:          2.110 m Patient Age:    59 years    BP:           153/77 mmHg Patient Gender: M           HR:           69 bpm. Exam Location:  Inpatient Procedure: 2D Echo, Color Doppler and Cardiac Doppler Indications:    I51.7 Cardiomegaly  History:        Patient has prior history of Echocardiogram examinations, most                 recent 10/05/2021. Arrythmias:Atrial Fibrillation; Risk                 Factors:Hypertension and Dyslipidemia. MV Repair ~2006.                  Mitral Valve: prosthetic annuloplasty ring valve is present in                 the mitral position.  Sonographer:    Raquel Sarna Senior RDCS Referring Phys: [phone] [place]  Sonographer Comments: Technically difficult study due to poor echo windows. Suboptimal apical windows. IMPRESSIONS  1. Left ventricular ejection fraction, by estimation, is 55%. The left ventricle has normal function. Left ventricular endocardial border not optimally defined to evaluate regional wall motion. There  is moderate asymmetric left ventricular hypertrophy of the septal segment. Left ventricular diastolic parameters are indeterminate.  2. Respiratory related septal shift.  3. Right ventricular systolic function is mildly reduced. The right ventricular size is mildly enlarged. There is moderately elevated pulmonary artery systolic pressure. The estimated right ventricular systolic pressure is 02.4 mmHg.  4. Left atrial size was severely dilated.  5. Right atrial size was moderately dilated.  6. The mitral valve has been repaired/replaced. Mild to moderate mitral valve regurgitation. No evidence  of mitral stenosis. The mean mitral valve gradient is 2.3 mmHg with average heart rate of 71 bpm. There is a prosthetic annuloplasty ring present in  the mitral position. Echo findings are consistent with normal structure and function of the mitral valve prosthesis.  7. Tricuspid valve regurgitation is mild to moderate.  8. The aortic valve is grossly normal. There is mild calcification of the aortic valve. Aortic valve regurgitation is not visualized. No aortic stenosis is present.  9. The inferior vena cava is dilated in size with >50% respiratory variability, suggesting right atrial pressure of 8 mmHg. FINDINGS  Left Ventricle: Left ventricular ejection fraction, by estimation, is 55%. The left ventricle has normal function. Left ventricular endocardial border not optimally defined to evaluate regional wall motion. The left ventricular internal cavity size was normal in size. There is moderate asymmetric left ventricular hypertrophy of the septal segment. Abnormal (paradoxical) septal motion consistent with post-operative status. Left ventricular diastolic parameters are indeterminate. Right Ventricle: The right ventricular size is mildly enlarged. No increase in right ventricular wall thickness. Right ventricular systolic function is mildly reduced. There is moderately elevated pulmonary artery systolic pressure. The  tricuspid regurgitant velocity is 3.37 m/s, and with an assumed right atrial pressure of 8 mmHg, the estimated right ventricular systolic pressure is 93.9 mmHg. Left Atrium: Left atrial size was severely dilated. Right Atrium: Right atrial size was moderately dilated. Pericardium: There is no evidence of pericardial effusion. Mitral Valve: The mitral valve has been repaired/replaced. Mild to moderate mitral valve regurgitation. There is a prosthetic annuloplasty ring present in the mitral position. Echo findings are consistent with normal structure and function of the mitral valve prosthesis. No evidence of mitral valve stenosis. MV peak gradient, 6.1 mmHg. The mean mitral valve gradient is 2.3 mmHg with average heart rate of 71 bpm. Tricuspid Valve: The tricuspid valve is normal in structure. Tricuspid valve regurgitation is mild to moderate. No evidence of tricuspid stenosis. Aortic Valve: The aortic valve is grossly normal. There is mild calcification of the aortic valve. Aortic valve regurgitation is not visualized. No aortic stenosis is present. Pulmonic Valve: The pulmonic valve was normal in structure. Pulmonic valve regurgitation is trivial. No evidence of pulmonic stenosis. Aorta: The aortic root is normal in size and structure. Ascending aorta measurements are within normal limits for age when indexed to body surface area. Venous: The inferior vena cava is dilated in size with greater than 50% respiratory variability, suggesting right atrial pressure of 8 mmHg. IAS/Shunts: No atrial level shunt detected by color flow Doppler.  LEFT VENTRICLE PLAX 2D LVIDd:         5.60 cm LVIDs:         4.00 cm LV PW:         1.00 cm LV IVS:        1.30 cm LVOT diam:     2.40 cm LV SV:         84 LV SV Index:   40 LVOT Area:     4.52 cm  RIGHT VENTRICLE RV S prime:     9.57 cm/s TAPSE (M-mode): 1.2 cm LEFT ATRIUM              Index        RIGHT ATRIUM           Index LA diam:        4.40 cm  2.09 cm/m   RA Area:      27.80 cm LA Vol (A2C):  128.0 ml 60.68 ml/m  RA Volume:   88.30 ml  41.86 ml/m LA Vol (A4C):   85.6 ml  40.58 ml/m LA Biplane Vol: 103.0 ml 48.83 ml/m  AORTIC VALVE LVOT Vmax:   91.25 cm/s LVOT Vmean:  62.900 cm/s LVOT VTI:    0.186 m  AORTA Ao Root diam: 3.80 cm Ao Asc diam:  3.50 cm MITRAL VALVE                TRICUSPID VALVE MV Area (PHT): 3.28 cm     TR Peak grad:   45.4 mmHg MV Area VTI:   2.40 cm     TR Vmax:        337.00 cm/s MV Peak grad:  6.1 mmHg MV Mean grad:  2.3 mmHg     SHUNTS MV Vmax:       1.23 m/s     Systemic VTI:  0.19 m MV Vmean:      57.1 cm/s    Systemic Diam: 2.40 cm MV Decel Time: 231 msec MV E velocity: 116.00 cm/s  Cherlynn Kaiser MD Electronically signed by Cherlynn Kaiser MD Signature Date/Time: 02/14/2022/11:54:21 AM    Final     Scheduled Meds:  amLODipine  5 mg Oral Daily   atenolol  25 mg Oral BID   feeding supplement  237 mL Oral BID BM   predniSONE  10 mg Oral Daily   pyridostigmine  60 mg Oral Q8H   tamsulosin  0.4 mg Oral Daily   Warfarin - Pharmacist Dosing Inpatient   Does not apply q1600   Continuous Infusions:   LOS: 0 days   Shelly Coss, MD [facility] P8/05/2022, 1:07 PM

## 2022-02-15 NOTE — Progress Notes (Signed)
Initial Nutrition Assessment  INTERVENTION:   -Ensure Plus High Protein po BID, each supplement provides 350 kcal and 20 grams of protein.   NUTRITION DIAGNOSIS:   Increased nutrient needs related to chronic illness as evidenced by estimated needs.  GOAL:   Patient will meet greater than or equal to 90% of their needs  MONITOR:   PO intake, Supplement acceptance, Labs, Weight trends, I & O's  REASON FOR ASSESSMENT:   Malnutrition Screening Tool    ASSESSMENT:   Patient is 88 year old male with history of paroxysmal A-fib on warfarin, osteoarthritis, mitral valve repair, hypertension, ocular myasthenia gravis who presented with complaint of bilateral lower extremity weakness.  Patient unavailable at time of visit.  Per chart review, pt reports poor appetite since partial nephrectomy on 7/18. Reports losing ~40 lbs. Very little PO documented. Ensure supplements have been ordered and pt is accepting.   Per weight records, pt has  lost 34 lbs since 7/23 (15% wt loss x 2.5 weeks, significant for time frame). Per nursing documentation, pt with moderate BLE edema.  Medications reviewed.  Labs reviewed.  NUTRITION - FOCUSED PHYSICAL EXAM:  Unable to complete  Diet Order:   Diet Order             Diet Heart Room service appropriate? Yes; Fluid consistency: Thin  Diet effective now                   EDUCATION NEEDS:   Not appropriate for education at this time  Skin:  Skin Assessment: Skin Integrity Issues: Skin Integrity Issues:: Incisions Incisions: 7/18 abdomen  Last BM:  8/10 -type 3  Height:   Ht Readings from Last 1 Encounters:  02/13/22 '6\' 1"'$  (1.854 m)    Weight:   Wt Readings from Last 1 Encounters:  02/13/22 86.6 kg    BMI:  Body mass index is 25.19 kg/m.  Estimated Nutritional Needs:   Kcal:  2000-2200  Protein:  90-100g  Fluid:  2L/day  Clayton Bibles, MS, RD, LDN Inpatient Clinical Dietitian Contact information available via  Amion

## 2022-02-15 NOTE — Progress Notes (Signed)
[place] for Warfarin Indication: history of atrial fibrillation   Allergies  Allergen Reactions   Mestinon [Pyridostigmine Bromide] Diarrhea    Patient Measurements: Height: '6\' 1"'$  (185.4 cm) Weight: 86.6 kg (190 lb 14.7 oz) IBW/kg (Calculated) : 79.9  Vital Signs: Temp: 98 F (36.7 C) (08/11 0611) Temp Source: Oral (08/11 0611) BP: 146/66 (08/11 0611) Pulse Rate: 60 (08/11 0611)  Labs: Recent Labs    02/13/22 1150 02/13/22 1433 02/14/22 0535 02/15/22 0717  HGB 11.5*  --  9.9* 9.7*  HCT 34.7*  --  30.8* 29.3*  PLT 254  --  200 212  APTT 40*  --   --   --   LABPROT 28.1*  --  32.1* 31.2*  INR 2.7*  --  3.2* 3.0*  CREATININE 1.72*  --  1.56* 1.54*  TROPONINIHS 46* 39*  --   --      Estimated Creatinine Clearance: 38.9 mL/min (A) (by C-G formula based on SCr of 1.54 mg/dL (H)).  Past Medical History:  Diagnosis Date   A-fib [facility])    history of   Arthritis    Dyspnea    H/O mitral valve repair    Hypertension    Ocular myasthenia gravis ([place])    takes pred   Pneumonia      Medications:  PTA warfarin (5 mg daily except 2.5 mg on Saturday)  Assessment: 58 YOM with myasthenia gravis presenting to ED for generalized weakness. Patient noted falling PTA but denies head trauma. Patient is on warfarin PTA for hx of afib. Pharmacy is consulted for dosing warfarin inpatient.   Significant Events: Warfarin dose held 8/10  Today, 02/15/22: - INR therapeutic at 3 - CBC: Hgb low but stable, Plt WNL  - No bleeding documented - No significant DDIs - Heart healthy diet ordered, RN reporting eating 50%  Goal of Therapy:  INR 2-3 per anticoagulation clinic (last visit 8/2) Monitor platelets by anticoagulation protocol: Yes   Plan:  - Warfarin 5 mg PO x 1 tonight  - Daily INR, CBC every 72 hours   At this time would recommend continuation of home dose at discharge  Si Raider, PharmD Candidate 02/15/2022 8:00 AM

## 2022-02-15 NOTE — Plan of Care (Signed)

## 2022-02-15 NOTE — TOC Initial Note (Signed)
Transition of Care [facility]) - Initial/Assessment Note    Patient Details  Name: Daniel Sharp MRN: [id number] Date of Birth: 11/28/1932  Transition of Care [facility]) CM/SW Contact:    Servando Snare, LCSW Phone Number: 02/15/2022, 12:01 PM  Clinical Narrative:             Patient is from home with adult son. Patient uses a cane at baseline. Patient reports he has access to everything in the home on one level. Patients son provides meals and transportation. Patient agreeable to SNF. TOC to fax patient out. Needs bed offers and insurance auth.       Expected Discharge Plan: Skilled Nursing Facility Barriers to Discharge: Continued Medical Work up   Patient Goals and CMS Choice Patient states their goals for this hospitalization and ongoing recovery are:: get better CMS Medicare.gov Compare Post Acute Care list provided to:: Patient Choice offered to / list presented to : Patient, Adult Children  Expected Discharge Plan and Services Expected Discharge Plan: [place] In-house Referral: NA Discharge Planning Services: NA Post Acute Care Choice: [place] Living arrangements for the past 2 months: Single Family Home                 DME Arranged: N/A DME Agency: NA       HH Arranged: NA [place] Agency: NA        Prior Living Arrangements/Services Living arrangements for the past 2 months: Single Family Home Lives with:: Adult Children Patient language and need for interpreter reviewed:: Yes Do you feel safe going back to the place where you live?: Yes      Need for Family Participation in Patient Care: Yes (Comment) Care giver support system in place?: Yes (comment) Current home services: DME Criminal Activity/Legal Involvement Pertinent to Current Situation/Hospitalization: No - Comment as needed  Activities of Daily Living Home Assistive Devices/Equipment: Cane (specify quad or straight), Shower chair with back, Grab bars around toilet, Grab bars in  shower, Environmental consultant (specify type), Wheelchair, Eyeglasses (reading glasses but can only read up to 30 minutes a day) ADL Screening (condition at time of admission) Patient's cognitive ability adequate to safely complete daily activities?: Yes Is the patient deaf or have difficulty hearing?: Yes Does the patient have difficulty seeing, even when wearing glasses/contacts?: Yes (sees double due to myasthenia gravis) Does the patient have difficulty concentrating, remembering, or making decisions?: No Patient able to express need for assistance with ADLs?: Yes Does the patient have difficulty dressing or bathing?: Yes Independently performs ADLs?: No Communication: Independent Dressing (OT): Independent Grooming: Independent Feeding: Independent Bathing: Independent Toileting: Needs assistance Is this a change from baseline?: Pre-admission baseline In/Out Bed: Needs assistance Is this a change from baseline?: Pre-admission baseline Walks in Home: Needs assistance Is this a change from baseline?: Pre-admission baseline Does the patient have difficulty walking or climbing stairs?: Yes Weakness of Legs: Both Weakness of Arms/Hands: Both  Permission Sought/Granted                  Emotional Assessment Appearance:: Appears stated age Attitude/Demeanor/Rapport: Apprehensive Affect (typically observed): Calm Orientation: : Oriented to Self, Oriented to Place, Oriented to  Time, Oriented to Situation Alcohol / Substance Use: Not Applicable Psych Involvement: No (comment)  Admission diagnosis:  Cardiomegaly [I51.7] Dehydration [E86.0] Bradycardia [R00.1] Elevated troponin [R77.8] Decreased oral intake [R63.8] Generalized weakness [R53.1] AKI (acute kidney injury) ([place]) [N17.9] Pulmonary vascular congestion [R09.89] Other fatigue [R53.83] Patient Active Problem List   Diagnosis Date Noted  Elevated troponin 02/13/2022   H/O mitral valve repair 02/13/2022   Generalized weakness  02/13/2022   Hypercalcemia 02/13/2022   Hyperbilirubinemia 02/13/2022   Renal cell carcinoma of left kidney ([place]) 01/30/2022   Myasthenia gravis without acute exacerbation ([place]) 01/30/2022   Renal mass 01/22/2022   H/O mitral valve replacement 07/11/2021   Localized edema 07/11/2021   Overweight with body mass index (BMI) 25.0-29.9 11/23/2018   Longstanding persistent atrial fibrillation ([place]) 03/28/2012   Hypertension 03/09/2009   Hypercholesterolemia 03/09/2009   PCP:  [name], MD Pharmacy:   [facility] [place], [place] [address] Phone: [phone] Fax: [phone]  [id number] [place] - [address] [place] [address]Phone: [phone]: [phone]    Social Determinants of Health (SDOH) Interventions    Readmission Risk Interventions     No data to display

## 2022-02-15 NOTE — Progress Notes (Signed)
Occupational Therapy Treatment Patient Details Name: Daniel Sharp MRN: [id number] DOB: 03/27/1933 Today's Date: 02/15/2022   History of present illness Daniel Sharp is a 88 y.o. male with medical history significant fro partial  nephrectomy 7/23, paroxysmal atrial fibrillation on warfarin, osteoarthritis, dyspnea, mitral valve repair, hypertension, history of pneumonia, ocular myasthenia gravis who is coming to the emergency department 02/13/22  due to bilateral lower extremity weakness   OT comments  Treatment focused on sitting and standing balance as well as standing grooming task as a strengthening and activity tolerance. Continues to exhibit a right lateral lean in sitting and standing and needs +2 assistance to safely transfer and ambulation.    Recommendations for follow up therapy are one component of a multi-disciplinary discharge planning process, led by the attending physician.  Recommendations may be updated based on patient status, additional functional criteria and insurance authorization.    Follow Up Recommendations  Skilled nursing-short term rehab (<3 hours/day)    Assistance Recommended at Discharge Frequent or constant Supervision/Assistance  Patient can return home with the following  A little help with walking and/or transfers;A little help with bathing/dressing/bathroom;Assistance with cooking/housework;Help with stairs or ramp for entrance   Equipment Recommendations  Tub/shower seat    Recommendations for Other Services      Precautions / Restrictions Precautions Precautions: Fall Precaution Comments: leans to the right, left shoulder pain and hematoma Restrictions Weight Bearing Restrictions: No       Mobility Bed Mobility                    Transfers                         Balance Overall balance assessment: Needs assistance Sitting-balance support: Feet supported, No upper extremity supported Sitting balance-Leahy Scale:  Poor Sitting balance - Comments: listing to the  right seated Postural control: Right lateral lean Standing balance support: During functional activity, Reliant on assistive device for balance Standing balance-Leahy Scale: Poor                             ADL either performed or assessed with clinical judgement   ADL Overall ADL's : Needs assistance/impaired     Grooming: Minimal assistance;Standing Grooming Details (indicate cue type and reason): Min assist to maintain standing balanace at sink for oral care. Patient able to mange brush, paste and cup and able to use two hands at once but also leaning to the right on to rehab tech to maintain balance                             Functional mobility during ADLs: Minimal assistance;+2 for physical assistance;Rolling walker (2 wheels) General ADL Comments: Falling to the right on edge of bed during and only able to maitnain midline for approx 20 seconds before leaning again. MIn assist to stand. Min x 2 to ambulate in room and working on maintaining upright position and not leaning/falling to the right. Multimodal cues but still exhibiting difficulty maintaining midline.    Extremity/Trunk Assessment Upper Extremity Assessment Upper Extremity Assessment: Overall WFL for tasks assessed RUE Deficits / Details: WFL ROM, grossly 5/5 strength - RUE Sensation: WNL RUE Coordination: WNL LUE Deficits / Details: WFL ROM, grossly 5/5 strength, large hematoma on psoterior shoulder from fall. LUE Sensation: WNL LUE Coordination: WNL   Lower Extremity Assessment Lower  Extremity Assessment: Defer to PT evaluation   Cervical / Trunk Assessment Cervical / Trunk Assessment: Normal    Vision Patient Visual Report: No change from baseline Additional Comments: intermittent and fluctuating right > left eyelid droopiness and double vision.   Perception     Praxis      Cognition Arousal/Alertness: Awake/alert Behavior  During Therapy: WFL for tasks assessed/performed Overall Cognitive Status: Within Functional Limits for tasks assessed                                          Exercises      Shoulder Instructions       General Comments      Pertinent Vitals/ Pain       Pain Assessment Pain Assessment: 0-10 Pain Score: 2  Pain Location: L shoulder/hematoma Pain Descriptors / Indicators: Grimacing, Discomfort Pain Intervention(s): Monitored during session  Home Living                                          Prior Functioning/Environment              Frequency  Min 2X/week        Progress Toward Goals  OT Goals(current goals can now be found in the care plan section)  Progress towards OT goals: Progressing toward goals  Acute Rehab OT Goals Patient Stated Goal: regain strength OT Goal Formulation: With patient Time For Goal Achievement: 02/28/22 Potential to Achieve Goals: Good  Plan Discharge plan remains appropriate    Co-evaluation          OT goals addressed during session: ADL's and self-care;Other (comment) (functional mobility)      AM-PAC OT "6 Clicks" Daily Activity     Outcome Measure   Help from another person eating meals?: A Little Help from another person taking care of personal grooming?: A Little Help from another person toileting, which includes using toliet, bedpan, or urinal?: A Little Help from another person bathing (including washing, rinsing, drying)?: A Little Help from another person to put on and taking off regular upper body clothing?: A Little Help from another person to put on and taking off regular lower body clothing?: A Little 6 Click Score: 18    End of Session Equipment Utilized During Treatment: Gait belt;Rolling walker (2 wheels)  OT Visit Diagnosis: Unsteadiness on feet (R26.81);Muscle weakness (generalized) (M62.81)   Activity Tolerance Patient tolerated treatment well   Patient Left  in chair;with chair alarm set;with family/visitor present   Nurse Communication Mobility status        Time: 3299-2426 OT Time Calculation (min): 26 min  Charges: OT General Charges $OT Visit: 1 Visit OT Treatments $Self Care/Home Management : 8-22 mins $Therapeutic Activity: 8-22 mins  Daniel Sharp, OTR/L [place]  Office [phone] Pager: [place] 02/15/2022, 2:38 PM

## 2022-02-16 DIAGNOSIS — G7000 Myasthenia gravis without (acute) exacerbation: Secondary | ICD-10-CM

## 2022-02-16 DIAGNOSIS — R778 Other specified abnormalities of plasma proteins: Secondary | ICD-10-CM | POA: Diagnosis not present

## 2022-02-16 LAB — PROTIME-INR
INR: 3 — ABNORMAL HIGH (ref 0.8–1.2)
Prothrombin Time: 30.6 seconds — ABNORMAL HIGH (ref 11.4–15.2)

## 2022-02-16 MED ORDER — WARFARIN SODIUM 2.5 MG PO TABS
2.5000 mg | ORAL_TABLET | Freq: Once | ORAL | Status: AC
  Administered 2022-02-16: 2.5 mg via ORAL
  Filled 2022-02-16: qty 1

## 2022-02-16 NOTE — Progress Notes (Signed)
PROGRESS NOTE  Daniel Sharp  MGQ:[id number] DOB: Jan 23, 1933 DOA: 02/13/2022 PCP: [name], MD   Brief Narrative:  Patient is 88 year old male with history of paroxysmal A-fib on warfarin, osteoarthritis, mitral valve repair, hypertension, ocular myasthenia gravis who presented with complaint of bilateral lower extremity weakness.  On presentation, he was hemodynamically stable.  Patient was started on IV fluids.  Lab work showed white cell count of 13.6.  Troponins was elevated at 46.  Also found to have potassium of 3.3, creatinine of 1.7.  Patient was admitted for further management.  PT/OT recommending skilled nursing facility on discharge.  Hospital course remarkable for persistent weakness, double vision.  Neurology consulted today.  Assessment & Plan:  Principal Problem:   Elevated troponin Active Problems:   H/O mitral valve replacement   Renal cell carcinoma of left kidney (HCC)   Hypertension   Hypercholesterolemia   Longstanding persistent atrial fibrillation (HCC)   Generalized weakness   Hypercalcemia   Hyperbilirubinemia   Generalized weakness : History of myasthenia gravis.  Could be exacerbation.  Takes prednisone at home 10 mg daily which we will continue.  Mestinon started by admitting physician.  PT/OT consulted , recommended SNF. He was doing well until today, complaining of ongoing weakness, double vision.  Neurology consulted today.  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  Continue monitoring.  Currently kidney function might be at baseline.  Elevated troponin: Denies any chest pain.  EKG did not show any significant ischemic changes.  Troponin is elevated but flat trended.  Echocardiogram showed EF of 55%, no obvious wall motion abnormality.  Paroxysmal A-fib: Remains in A-fib, rate controlled.  Monitor on telemetry.  On warfarin for anticoagulation.  On atenolol for rate control  History of  mitral valve replacement: On warfarin.  Monitor INR.  Goal is 2.5-3.5.  Echo showed functioning mitral valve  Hypertension: Takes benazepril, atenolol at home.  Benazepril hold due to AKI/CKD.  Added amlodipine 5 mg daily.  History of renal cell carcinoma of left kidney: Status post nephrectomy.  Leukocytosis: Mild.  Most likely reactive from steroids.  Continue to monitor  Normocytic anemia: hemoglobin currently stable in the range of 9-11.   Nutrition Problem: Increased nutrient needs Etiology: chronic illness    DVT prophylaxis:warfarin     Code Status: Full Code  Family Communication: Discussed with son at bedside on 8/12  Patient status:Inpatient  Patient is from :Home  Anticipated discharge to:SNF  Estimated DC date: Not sure   Consultants: Neurology Procedures: None  Antimicrobials:  Anti-infectives (From admission, onward)    None       Subjective: Patient seen and examined at the bedside this morning.  Hemodynamically stable.  Lying in bed, appears weaker today.  Says he feels okay but seeing double vision.  Long discussion held at the bedside with son.  We decided to pursue neurology consultation.  Objective: Vitals:   02/15/22 1455 02/15/22 2121 02/16/22 0455 02/16/22 0828  BP: (!) 114/52 (!) 132/51 (!) 129/58   Pulse: (!) 56 (!) 56 (!) 54 62  Resp: '18 18 18   '$ Temp: 97.7 F (36.5 C) 98.5 F (36.9 C) 98.1 F (36.7 C)   TempSrc: Oral Oral Oral   SpO2:  92% 91%   Weight:      Height:        Intake/Output Summary (Last 24 hours) at 02/16/2022 1141 Last data filed at 02/16/2022 1012 Gross per 24 hour  Intake 360 ml  Output 400 ml  Net -40 ml   Filed Weights   02/13/22 1108 02/13/22 1955  Weight: 89.8 kg 86.6 kg    Examination:  General exam: Weak, lying in bed HEENT: PERRL, complaint double vision Respiratory system:  no wheezes or crackles  Cardiovascular system: S1 & S2 heard, RRR.  Gastrointestinal system: Abdomen is nondistended,  soft and nontender. Central nervous system: Alert and oriented, motor of 4+/5 on bilateral lower extremities, 5/5 in upper extremities Extremities: No edema, no clubbing ,no cyanosis Skin: No rashes, no ulcers,no icterus        Data Reviewed: I have personally reviewed following labs and imaging studies  CBC: Recent Labs  Lab 02/13/22 1150 02/14/22 0535 02/15/22 0717  WBC 13.6* 11.8* 13.0*  NEUTROABS 11.1*  --   --   HGB 11.5* 9.9* 9.7*  HCT 34.7* 30.8* 29.3*  MCV 94.8 95.4 95.1  PLT 254 200 741   Basic Metabolic Panel: Recent Labs  Lab 02/13/22 1150 02/14/22 0535 02/15/22 0717  NA 137 138 139  K 3.3* 3.6 3.7  CL 93* 97* 99  CO2 33* 33* 33*  GLUCOSE 115* 174* 107*  BUN 53* 48* 46*  CREATININE 1.72* 1.56* 1.54*  CALCIUM 10.8* 10.4* 10.4*     No results found for this or any previous visit (from the past 240 hour(s)).   Radiology Studies: No results found.  Scheduled Meds:  amLODipine  5 mg Oral Daily   atenolol  25 mg Oral BID   feeding supplement  237 mL Oral BID BM   predniSONE  10 mg Oral Daily   pyridostigmine  60 mg Oral Q8H   tamsulosin  0.4 mg Oral Daily   Warfarin - Pharmacist Dosing Inpatient   Does not apply q1600   Continuous Infusions:   LOS: 0 days   Shelly Coss, MD [facility] P8/06/2022, 11:41 AM

## 2022-02-16 NOTE — Progress Notes (Signed)
ANTICOAGULATION CONSULT NOTE  Pharmacy Consult for Warfarin Indication: history of atrial fibrillation   Allergies  Allergen Reactions   Mestinon [Pyridostigmine Bromide] Diarrhea    Patient Measurements: Height: '6\' 1"'$  (185.4 cm) Weight: 86.6 kg (190 lb 14.7 oz) IBW/kg (Calculated) : 79.9  Vital Signs: Temp: 98.1 F (36.7 C) (08/12 0455) Temp Source: Oral (08/12 0455) BP: 129/58 (08/12 0455) Pulse Rate: 62 (08/12 0828)  Labs: Recent Labs    02/13/22 1433 02/14/22 0535 02/15/22 0717 02/16/22 0545  HGB  --  9.9* 9.7*  --   HCT  --  30.8* 29.3*  --   PLT  --  200 212  --   LABPROT  --  32.1* 31.2* 30.6*  INR  --  3.2* 3.0* 3.0*  CREATININE  --  1.56* 1.54*  --   TROPONINIHS 39*  --   --   --      Estimated Creatinine Clearance: 38.9 mL/min (A) (by C-G formula based on SCr of 1.54 mg/dL (H)).  Past Medical History:  Diagnosis Date   A-fib [facility])    history of   Arthritis    Dyspnea    H/O mitral valve repair    Hypertension    Ocular myasthenia gravis ([place])    takes pred   Pneumonia      Medications:  PTA warfarin (5 mg daily except 2.5 mg on Saturday)  Assessment: 41 YOM with myasthenia gravis presenting to ED for generalized weakness. Patient noted falling PTA but denies head trauma. Patient is on warfarin PTA for hx of afib. Pharmacy is consulted for dosing warfarin inpatient.   Today, 02/16/22: - INR therapeutic at 3 - Last CBC on 8/11 stable.  - No bleeding documented - No significant DDIs - Heart healthy diet and nutritional supplements ordered.   - Neurology consulted for ongoing weakness, double vision  Goal of Therapy:  INR 2-3 per anticoagulation clinic (last visit 8/2) Monitor platelets by anticoagulation protocol: Yes   Plan:  - Warfarin 2.5 mg PO x 1 tonight  - Daily INR, CBC every 72 hours    Gretta Arab PharmD, BCPS Clinical Pharmacist [facility] main pharmacy [phone] 02/16/2022 12:43 PM

## 2022-02-16 NOTE — Plan of Care (Signed)
  Problem: Activity: Goal: Risk for activity intolerance will decrease Outcome: Progressing   Problem: Pain Managment: Goal: General experience of comfort will improve Outcome: Progressing   Problem: Safety: Goal: Ability to remain free from injury will improve Outcome: Progressing   

## 2022-02-16 NOTE — TOC Progression Note (Signed)
Transition of Care [facility]) - Progression Note    Patient Details  Name: Daniel Sharp MRN: [id number] Date of Birth: 1933-01-18  Transition of Care [facility]) CM/SW [place], LCSW Phone Number: 02/16/2022, 9:46 AM  Clinical Narrative:    Csw spoke with pt and pt's son Bruce to discuss bed offers. Pt and son reported waning to speak with family to discuss their options.    Expected Discharge Plan: [place] Barriers to Discharge: Continued Medical Work up  Expected Discharge Plan and Services Expected Discharge Plan: [place] In-house Referral: NA Discharge Planning Services: NA Post Acute Care Choice: [place] Living arrangements for the past 2 months: Single Family Home                 DME Arranged: N/A DME Agency: NA       HH Arranged: NA HH Agency: NA         Social Determinants of Health (SDOH) Interventions    Readmission Risk Interventions     No data to display

## 2022-02-16 NOTE — Consult Note (Addendum)
Neurology Consultation  Reason for Consult: Myasthenia Gravis  Referring Physician: Dr. Tawanna Solo  CC: Bilateral lower extremity weakness, double vision  History is obtained from: Patient, chart review  HPI: Daniel Sharp is a 88 y.o. male past medical history of paroxysmal A-fib on warfarin, osteoarthritis, mitral valve repair, hypertension, ocular myasthenia gravis s/p thymetcomy who presented with bilateral lower extremity weakness on 02/13/2022.  He saw Dr. Posey Pronto from [facility] on 02/11/2022 and was previous seen by Dr. Kathaleen Maser in [place], [place]. He was diagnosed with MG in 1998 at [facility]. He has intermittent and fluctuating right > left eyelid droopiness and double vision and left hemibody weakness.  He has been on mestinon in the past, which was helpful, as well as prednisone.  The highest dose of prednisone is '15mg'$ . He currently takes prednisone '10mg'$  since June.  He has been off mestinon for about 4 years stating that he developed an intolerance.  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  He states that the best he felt since being admitted to the hospital was the day he started mestinon. Since then he does feel like he has improved and is close to his baseline. He has not been up walking around but is able to transfer from the bed and take a few steps to the chair with assistance. He does use a walker and a wheelchair at home.   PT/OT recommending skilled nursing facility on discharge.  His weakness has improved.    ROS: Full ROS was performed and is negative except as noted in the HPI.   Past Medical History:  Diagnosis Date   A-fib [facility])    history of   Arthritis    Dyspnea    H/O mitral valve repair    Hypertension    Ocular myasthenia gravis ([place])    takes pred   Pneumonia      Family History  Problem Relation Age of Onset   Other Mother        Gallbladder removed   Stroke Father        intracranial stroke   Stroke Brother       Social History:   reports that he has quit smoking. His smoking use included cigarettes, pipe, and cigars. He has quit using smokeless tobacco.  His smokeless tobacco use included chew. He reports current alcohol use of about 7.0 standard drinks of alcohol per week. He reports that he does not use drugs.  Medications  Current Facility-Administered Medications:    acetaminophen (TYLENOL) tablet 650 mg, 650 mg, Oral, Q6H PRN **OR** acetaminophen (TYLENOL) suppository 650 mg, 650 mg, Rectal, Q6H PRN, Reubin Milan, MD   amLODipine (NORVASC) tablet 5 mg, 5 mg, Oral, Daily, Adhikari, Amrit, MD, 5 mg at 02/16/22 0827   atenolol (TENORMIN) tablet 25 mg, 25 mg, Oral, BID, Adhikari, Amrit, MD, 25 mg at 02/16/22 2248   diphenoxylate-atropine (LOMOTIL) 2.5-0.025 MG per tablet 1 tablet, 1 tablet, Oral, QID PRN, Reubin Milan, MD   feeding supplement (ENSURE ENLIVE / ENSURE PLUS) liquid 237 mL, 237 mL, Oral, BID BM, Reubin Milan, MD, 237 mL at 02/16/22 0829   HYDROcodone-acetaminophen (NORCO/VICODIN) 5-325 MG per tablet 1 tablet, 1 tablet, Oral, Q6H PRN, Reubin Milan, MD, 1 tablet at 02/16/22 0632   ondansetron (ZOFRAN) tablet 4 mg, 4 mg, Oral, Q6H PRN **OR** ondansetron (ZOFRAN) injection 4 mg, 4 mg, Intravenous, Q6H PRN, Reubin Milan, MD   predniSONE (DELTASONE) tablet 10 mg, 10 mg,  Oral, Daily, Reubin Milan, MD, 10 mg at 02/16/22 0827   pyridostigmine (MESTINON) tablet 60 mg, 60 mg, Oral, Q8H, Reubin Milan, MD, 60 mg at 02/16/22 7169   tamsulosin [facility]) capsule 0.4 mg, 0.4 mg, Oral, Daily, Reubin Milan, MD, 0.4 mg at 02/16/22 0827   Warfarin - Pharmacist Dosing Inpatient, , Does not apply, q1600, Dimple Nanas, [facility], Given at 02/15/22 1626  Exam: Current vital signs: BP (!) 129/58 (BP Location: Right Arm)   Pulse 62   Temp 98.1 F (36.7 C) (Oral)   Resp 18   Ht '6\' 1"'$  (1.854 m)   Wt 86.6 kg   SpO2 91%   BMI 25.19 kg/m  Vital  signs in last 24 hours: Temp:  [97.7 F (36.5 C)-98.5 F (36.9 C)] 98.1 F (36.7 C) (08/12 0455) Pulse Rate:  [54-62] 62 (08/12 0828) Resp:  [18] 18 (08/12 0455) BP: (114-132)/(51-58) 129/58 (08/12 0455) SpO2:  [91 %-92 %] 91 % (08/12 0455)  GENERAL: Awake, alert in NAD HEENT: - Normocephalic and atraumatic, dry mm, no LN++, no Thyromegally LUNGS - Clear to auscultation bilaterally with no wheezes CV - S1S2 RRR, no m/r/g, equal pulses bilaterally. ABDOMEN - Soft, nontender, nondistended with normoactive BS Ext: warm, well perfused, intact peripheral pulses, no edema  NEURO:  Mental Status: AA&Ox3  Language: speech is clear.  Naming, repetition, fluency, and comprehension intact. Able to count to 19 in a single breath. Cranial Nerves: PERRL, EOMI, diplopia in lateral visual fields, central vision is intact with no diplopia or blurriness, no facial asymmetry, facial sensation intact, hearing intact, tongue/uvula/soft palate midline, normal sternocleidomastoid and trapezius muscle strength. No evidence of tongue atrophy or fasciculations  Motor:   Neck flexion 4/5    Right  Left Shoulder Abduction  5/5  5/5 Elbow Flexion   5/5  5/5 Elbow Extension  5/5  5/5 Hand Grasp   5/5  5/5  Hip Flexion   4-/5  4-/5 Knee Flexion   4/5  4/5 Knee Extension  4/5  4/5 Ankle Dorsiflexion  4+/5  4+/5 Ankle Plantarflexion  4+/5  4+/5  Tone: is normal and bulk is normal Sensation- Intact to light touch bilaterally Coordination: FTN intact bilaterally, no ataxia noted, slow HKS in line with weakness Gait- slow, unsteady, assisted with two wheel walker.    Labs I have reviewed labs in epic and the results pertinent to this consultation are: CBC    Component Value Date/Time   WBC 13.0 (H) 02/15/2022 0717   RBC 3.08 (L) 02/15/2022 0717   HGB 9.7 (L) 02/15/2022 0717   HCT 29.3 (L) 02/15/2022 0717   PLT 212 02/15/2022 0717   MCV 95.1 02/15/2022 0717   MCH 31.5 02/15/2022 0717   MCHC 33.1  02/15/2022 0717   RDW 12.8 02/15/2022 0717   LYMPHSABS 1.2 02/13/2022 1150   MONOABS 1.2 (H) 02/13/2022 1150   EOSABS 0.0 02/13/2022 1150   BASOSABS 0.0 02/13/2022 1150    CMP     Component Value Date/Time   NA 139 02/15/2022 0717   K 3.7 02/15/2022 0717   CL 99 02/15/2022 0717   CO2 33 (H) 02/15/2022 0717   GLUCOSE 107 (H) 02/15/2022 0717   BUN 46 (H) 02/15/2022 0717   CREATININE 1.54 (H) 02/15/2022 0717   CALCIUM 10.4 (H) 02/15/2022 0717   PROT 6.1 (L) 02/14/2022 0535   ALBUMIN 3.3 (L) 02/14/2022 0535   AST 15 02/14/2022 0535   ALT 12 02/14/2022 0535   ALKPHOS 51  02/14/2022 0535   BILITOT 1.2 02/14/2022 0535   GFRNONAA 44 (L) 02/15/2022 0717    Lipid Panel  No results found for: "CHOL", "TRIG", "HDL", "CHOLHDL", "VLDL", "LDLCALC", "LDLDIRECT"   Imaging I have reviewed the images obtained:  CT-head- no acute intracranial abnormality, Stable tiny right frontal meningioma without mass-effect or adjacent edema.  Echo- EF 55%  Assessment: Neurology consultation for possible Myasthenia Gravis Exacerbation. 88 y.o. male with a past medical history of paroxysmal A-fib on warfarin, osteoarthritis, mitral valve repair, hypertension, ocular myasthenia gravis s/p thymetcomy who presented with bilateral lower extremity weakness on 02/13/2022.  He saw Dr. Posey Pronto from [facility] on 02/11/2022 and was previous seen by Dr. Kathaleen Maser in [place], [place]. He was diagnosed with MG in 1998 at [facility]. He has been on prednisone and off mestinon for 4 years. He has improved with the reintroduction of mestinon during this admission. He does have improved strength in his bilateral lower extremities and his vision has returned to baseline.   Recommendations: - Every 6 hours NIFs and VC - Recommend elective intubation for respiratory compromise if VC falls below 15 to 20 mL/kg and or NIFs falls below -20cm/H2O. If poor effort and not sure, can always get ABG to assess for CO2 retention. Elective  intubation for airway cmopromise if she has difficulty clearing her secretions. Oxygen saturation should not be used to make decision regarding intubation. - Continue mestinon '60mg'$  q8hrs and prednisone '10mg'$  dailiy - PT/OT - Medications that may worsen or trigger MG exacerbation: Class IA antiarrhythmics, magnesium, flouroquinolones, macrolides, aminoglycosides, penicillamine, curare, interferon alpha, botox, quinine. Use with caution: calcium channel blocker, beta blockers and statins. - CT chest with contrast to evaluate for thymoma / evaluate for aspiration - Recommend discontinuing Metoprolol and using an alternative antihypertensive. - Neurohospitalist service will sign off. Please call if there are additional questions.   -- Patient seen and examined by NP/APP with MD.  Janine Ores, DNP, FNP-BC [facility] Pager: [phone]  I have seen and examined the patient. I have discussed the assessment and recommendations with the Neurology NP. 88 y.o. male past medical history of paroxysmal A-fib on warfarin, osteoarthritis, mitral valve repair, hypertension, ocular myasthenia gravis s/p thymetcomy who presented with bilateral lower extremity weakness on 02/13/2022. He has been on prednisone and off mestinon for 4 years. He has improved with the reintroduction of mestinon during this admission. He does have improved strength in his bilateral lower extremities and his vision has returned to baseline. Examination reveals unlabored respirations and normal bilateral upper extremity strength with mild BLE weakness, but no extraocular muscle weakness other than mild diplopia when gazing laterally. Potential benefits of IVIG or PLEX are significantly outweighed by risks given mild diplopia as the only significant bulbar finding on today's exam, as well as BLE weakness more likely being multifactorial rather than secondary to a MG exacerbation. Other recommendations as above.  Electronically  signed: Dr. Kerney Elbe

## 2022-02-17 DIAGNOSIS — R778 Other specified abnormalities of plasma proteins: Secondary | ICD-10-CM | POA: Diagnosis not present

## 2022-02-17 LAB — PROTIME-INR
INR: 3.1 — ABNORMAL HIGH (ref 0.8–1.2)
Prothrombin Time: 31.8 seconds — ABNORMAL HIGH (ref 11.4–15.2)

## 2022-02-17 MED ORDER — WARFARIN SODIUM 2.5 MG PO TABS
2.5000 mg | ORAL_TABLET | Freq: Once | ORAL | Status: AC
  Administered 2022-02-17: 2.5 mg via ORAL
  Filled 2022-02-17: qty 1

## 2022-02-17 NOTE — Progress Notes (Signed)
[facility] for Warfarin Indication: history of atrial fibrillation   Allergies  Allergen Reactions   Mestinon [Pyridostigmine Bromide] Diarrhea    Patient Measurements: Height: '6\' 1"'$  (185.4 cm) Weight: 86.6 kg (190 lb 14.7 oz) IBW/kg (Calculated) : 79.9  Vital Signs: Temp: 98 F (36.7 C) (08/13 0535) Temp Source: Oral (08/13 0535) BP: 148/72 (08/13 0535) Pulse Rate: 56 (08/13 0535)  Labs: Recent Labs    02/15/22 0717 02/16/22 0545 02/17/22 0808  HGB 9.7*  --   --   HCT 29.3*  --   --   PLT 212  --   --   LABPROT 31.2* 30.6* 31.8*  INR 3.0* 3.0* 3.1*  CREATININE 1.54*  --   --      Estimated Creatinine Clearance: 38.2 mL/min (A) (by C-G formula based on SCr of 1.54 mg/dL (H)).  Past Medical History:  Diagnosis Date   A-fib [facility])    history of   Arthritis    Dyspnea    H/O mitral valve repair    Hypertension    Ocular myasthenia gravis ([place])    takes pred   Pneumonia      Medications:  PTA warfarin (5 mg daily except 2.5 mg on Saturday)  Assessment: 57 YOM with myasthenia gravis presenting to ED for generalized weakness. Patient noted falling PTA but denies head trauma. Patient is on warfarin PTA for hx of afib. Pharmacy is consulted for dosing warfarin inpatient.   Today, 02/16/22: - INR 3.1, slightly supratherapeutic  Reduced dose 2.5 mg given on 8/12 as per home regimen. - Last CBC on 8/11 stable.  - No bleeding documented - No significant DDIs - Heart healthy diet and nutritional supplements ordered.    Goal of Therapy:  INR 2-3 per anticoagulation clinic (last visit 8/2) Monitor platelets by anticoagulation protocol: Yes   Plan:  - Warfarin 2.5 mg PO x 1 tonight  - Daily INR, CBC every 72 hours    Gretta Arab PharmD, BCPS Clinical Pharmacist [facility] main pharmacy [phone] 02/17/2022 11:16 AM

## 2022-02-17 NOTE — Progress Notes (Signed)
PROGRESS NOTE  Daniel Sharp  WCH:[id number] DOB: 07-09-1932 DOA: 02/13/2022 PCP: [name], MD   Brief Narrative:  Patient is 88 year old male with history of paroxysmal A-fib on warfarin, osteoarthritis, mitral valve repair, hypertension, ocular myasthenia gravis who presented with complaint of bilateral lower extremity weakness.  On presentation, he was hemodynamically stable.  Patient was started on IV fluids.  Lab work showed white cell count of 13.6.  Troponins was elevated at 46.  Also found to have potassium of 3.3, creatinine of 1.7.  Patient was admitted for further management.  PT/OT recommending skilled nursing facility on discharge.  Hospital course remarkable for persistent weakness, double vision.  Neurology recommended to continue prednisone, Mestinon.  Medically stable for discharge, waiting for a skilled nursing facility bed.  Assessment & Plan:  Principal Problem:   Elevated troponin Active Problems:   H/O mitral valve replacement   Renal cell carcinoma of left kidney (HCC)   Hypertension   Hypercholesterolemia   Longstanding persistent atrial fibrillation (HCC)   Generalized weakness   Hypercalcemia   Hyperbilirubinemia   Generalized weakness : History of myasthenia gravis.  Could be exacerbation.  Takes prednisone at home 10 mg daily which we will continue.  Mestinon to be continued.  PT/OT consulted , recommended SNF. 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  Continue monitoring.  Currently kidney function might be at baseline.  Elevated troponin: Denies any chest pain.  EKG did not show any significant ischemic changes.  Troponin is elevated but flat trended.  Echocardiogram showed EF of 55%, no obvious wall motion abnormality.  Paroxysmal A-fib: Remains in A-fib, rate controlled.  Monitor on telemetry.  On warfarin for anticoagulation.  On atenolol for  rate control  History of mitral valve replacement: On warfarin.  Monitor INR.  Goal is 2.5-3.5.  Echo showed functioning mitral valve  Hypertension: Takes benazepril, atenolol at home.  Benazepril hold due to AKI/CKD.  Added amlodipine 5 mg daily.  History of renal cell carcinoma of left kidney: Status post nephrectomy.  Leukocytosis: Mild.  Most likely reactive from steroids.  Continue to monitor  Normocytic anemia: hemoglobin currently stable in the range of 9-11.   Nutrition Problem: Increased nutrient needs Etiology: chronic illness    DVT prophylaxis:warfarin warfarin (COUMADIN) tablet 2.5 mg     Code Status: Full Code  Family Communication: Discussed with son at bedside on 8/13  Patient status:Inpatient  Patient is from :Home  Anticipated discharge to:SNF  Estimated DC date:as soon as bed is available   Consultants: Neurology Procedures: None  Antimicrobials:  Anti-infectives (From admission, onward)    None       Subjective: Patient seen and examined at the bedside this morning.  Hemodynamically stable.  Eating his breakfast.  Son at the bedside.  Denies any new complaints today.   Objective: Vitals:   02/16/22 0828 02/16/22 1334 02/16/22 2103 02/17/22 0535  BP:  (!) 107/55 (!) 120/55 (!) 148/72  Pulse: 62 (!) 49 (!) 50 (!) 56  Resp:  '18 16 18  '$ Temp:  98 F (36.7 C) 98 F (36.7 C) 98 F (36.7 C)  TempSrc:  Oral Oral Oral  SpO2:  97% 95% 94%  Weight:      Height:        Intake/Output Summary (Last 24 hours) at 02/17/2022 1156 Last data filed at 02/16/2022 2144 Gross per 24 hour  Intake 360 ml  Output  425 ml  Net -65 ml   Filed Weights   02/13/22 1108 02/13/22 1955  Weight: 89.8 kg 86.6 kg    Examination:  General exam: Overall comfortable, not in distress, lying in bed HEENT: PERRL Respiratory system:  no wheezes or crackles  Cardiovascular system: S1 & S2 heard, RRR.  Gastrointestinal system: Abdomen is nondistended, soft and  nontender. Central nervous system: Alert and oriented, generalized weakness Extremities: No edema, no clubbing ,no cyanosis Skin: No rashes, no ulcers,no icterus         Data Reviewed: I have personally reviewed following labs and imaging studies  CBC: Recent Labs  Lab 02/13/22 1150 02/14/22 0535 02/15/22 0717  WBC 13.6* 11.8* 13.0*  NEUTROABS 11.1*  --   --   HGB 11.5* 9.9* 9.7*  HCT 34.7* 30.8* 29.3*  MCV 94.8 95.4 95.1  PLT 254 200 536   Basic Metabolic Panel: Recent Labs  Lab 02/13/22 1150 02/14/22 0535 02/15/22 0717  NA 137 138 139  K 3.3* 3.6 3.7  CL 93* 97* 99  CO2 33* 33* 33*  GLUCOSE 115* 174* 107*  BUN 53* 48* 46*  CREATININE 1.72* 1.56* 1.54*  CALCIUM 10.8* 10.4* 10.4*     No results found for this or any previous visit (from the past 240 hour(s)).   Radiology Studies: No results found.  Scheduled Meds:  amLODipine  5 mg Oral Daily   atenolol  25 mg Oral BID   feeding supplement  237 mL Oral BID BM   predniSONE  10 mg Oral Daily   pyridostigmine  60 mg Oral Q8H   tamsulosin  0.4 mg Oral Daily   warfarin  2.5 mg Oral ONCE-1600   Warfarin - Pharmacist Dosing Inpatient   Does not apply q1600   Continuous Infusions:   LOS: 0 days   Shelly Coss, MD [facility] P8/13/2023, 11:56 AM

## 2022-02-17 NOTE — TOC Progression Note (Signed)
Transition of Care [facility]) - Progression Note    Patient Details  Name: Daniel Sharp MRN: [id number] Date of Birth: [birth year]  [facility] CM/SW Contact  Lennart Pall, LCSW Phone Number: 02/17/2022, 11:20 AM  Clinical Narrative:    Alerted by family that they would like to accept SNF bed offer from [facility].  Contacted facility and they will need to confirm bed availability tomorrow and start auth - will alert oncoming TOC staff to follow up with facility tomorrow.   Expected Discharge Plan: [place] Barriers to Discharge: Continued Medical Work up  Expected Discharge Plan and Services Expected Discharge Plan: [place] In-house Referral: NA Discharge Planning Services: NA Post Acute Care Choice: [place] Living arrangements for the past 2 months: Single Family Home                 DME Arranged: N/A DME Agency: NA       HH Arranged: NA HH Agency: NA         Social Determinants of Health (SDOH) Interventions    Readmission Risk Interventions     No data to display

## 2022-02-18 DIAGNOSIS — R778 Other specified abnormalities of plasma proteins: Secondary | ICD-10-CM | POA: Diagnosis not present

## 2022-02-18 LAB — CBC
HCT: 28.5 % — ABNORMAL LOW (ref 39.0–52.0)
Hemoglobin: 9.2 g/dL — ABNORMAL LOW (ref 13.0–17.0)
MCH: 31 pg (ref 26.0–34.0)
MCHC: 32.3 g/dL (ref 30.0–36.0)
MCV: 96 fL (ref 80.0–100.0)
Platelets: 238 10*3/uL (ref 150–400)
RBC: 2.97 MIL/uL — ABNORMAL LOW (ref 4.22–5.81)
RDW: 12.7 % (ref 11.5–15.5)
WBC: 11.5 10*3/uL — ABNORMAL HIGH (ref 4.0–10.5)
nRBC: 0 % (ref 0.0–0.2)

## 2022-02-18 LAB — PROTIME-INR
INR: 3.1 — ABNORMAL HIGH (ref 0.8–1.2)
Prothrombin Time: 31.5 seconds — ABNORMAL HIGH (ref 11.4–15.2)

## 2022-02-18 MED ORDER — WARFARIN SODIUM 2.5 MG PO TABS
2.5000 mg | ORAL_TABLET | Freq: Once | ORAL | Status: AC
Start: 2022-02-18 — End: 2022-02-18
  Administered 2022-02-18: 2.5 mg via ORAL
  Filled 2022-02-18: qty 1

## 2022-02-18 NOTE — Progress Notes (Signed)
Physical Therapy Treatment Patient Details Name: Daniel Sharp MRN: [id number] DOB: 06-Aug-1932 Today's Date: 02/18/2022   History of Present Illness Daniel Sharp is a 88 y.o. male with medical history significant fro partial  nephrectomy 7/23, paroxysmal atrial fibrillation on warfarin, osteoarthritis, dyspnea, mitral valve repair, hypertension, history of pneumonia, ocular myasthenia gravis who is coming to the emergency department 02/13/22  due to bilateral lower extremity weakness    PT Comments    Pt assisted with standing and transfer to/from [facility].  Pt requiring cues for posture and safety.  Pt continues to present with right lateral lean especially with fatigue.  Pt currently mod-max +2 assist for mobility.  Continue to recommend SNF upon d/c.    Recommendations for follow up therapy are one component of a multi-disciplinary discharge planning process, led by the attending physician.  Recommendations may be updated based on patient status, additional functional criteria and insurance authorization.  Follow Up Recommendations  Skilled nursing-short term rehab (<3 hours/day) Can patient physically be transported by private vehicle: No   Assistance Recommended at Discharge Frequent or constant Supervision/Assistance  Patient can return home with the following A lot of help with walking and/or transfers;A lot of help with bathing/dressing/bathroom;Assistance with cooking/housework;Assist for transportation;Help with stairs or ramp for entrance   Equipment Recommendations  None recommended by PT    Recommendations for Other Services       Precautions / Restrictions Precautions Precautions: Fall Precaution Comments: leans to the right, left shoulder pain and hematoma Restrictions Weight Bearing Restrictions: No     Mobility  Bed Mobility Overal bed mobility: Needs Assistance Bed Mobility: Supine to Sit     Supine to sit: Mod assist     General bed mobility comments: assist for  trunk stability with getting to EOB    Transfers Overall transfer level: Needs assistance Equipment used: Rolling walker (2 wheels) Transfers: Sit to/from Stand, Bed to chair/wheelchair/BSC Sit to Stand: Mod assist, Max assist   Step pivot transfers: Max assist, Mod assist       General transfer comment: cues for hand placement and posture, pt requiring more assist with fatigue, pt had BM in bed so assisted to/from [facility], pt stood in front of BSC for pericare for approx 5 min min/guard to min assist    Ambulation/Gait               General Gait Details: pt fatigued and with more prominent R lean with fatigue from transfer to [place]             Wheelchair Mobility    Modified Rankin (Stroke Patients Only)       Balance Overall balance assessment: Needs assistance Sitting-balance support: Bilateral upper extremity supported, Feet supported Sitting balance-Leahy Scale: Poor   Postural control: Right lateral lean Standing balance support: During functional activity, Reliant on assistive device for balance, Bilateral upper extremity supported Standing balance-Leahy Scale: Zero                              Cognition Arousal/Alertness: Awake/alert Behavior During Therapy: WFL for tasks assessed/performed Overall Cognitive Status: Within Functional Limits for tasks assessed                                          Exercises      General Comments  Pertinent Vitals/Pain Pain Assessment Pain Assessment: 0-10 Pain Score: 2  Pain Location: L shoulder/hematoma Pain Descriptors / Indicators: Grimacing, Discomfort Pain Intervention(s): Repositioned, Monitored during session    Home Living                          Prior Function            PT Goals (current goals can now be found in the care plan section) Progress towards PT goals: Progressing toward goals    Frequency    Min 2X/week      PT  Plan Current plan remains appropriate    Co-evaluation              AM-PAC PT "6 Clicks" Mobility   Outcome Measure  Help needed turning from your back to your side while in a flat bed without using bedrails?: A Lot Help needed moving from lying on your back to sitting on the side of a flat bed without using bedrails?: A Lot Help needed moving to and from a bed to a chair (including a wheelchair)?: A Lot Help needed standing up from a chair using your arms (e.g., wheelchair or bedside chair)?: A Lot Help needed to walk in hospital room?: Total Help needed climbing 3-5 steps with a railing? : Total 6 Click Score: 10    End of Session Equipment Utilized During Treatment: Gait belt Activity Tolerance: Patient limited by fatigue Patient left: in bed;with call bell/phone within reach;with family/visitor present;with nursing/sitter in room   PT Visit Diagnosis: Muscle weakness (generalized) (M62.81);Difficulty in walking, not elsewhere classified (R26.2);Unsteadiness on feet (R26.81)     Time: 8916-9450 PT Time Calculation (min) (ACUTE ONLY): 23 min  Charges:  $Therapeutic Activity: 23-37 mins                     Jannette Spanner PT, DPT Physical Therapist Acute Rehabilitation Services Preferred contact method: Secure Chat Weekend Pager Only: [phone] Office: [place] 02/18/2022, 11:33 AM

## 2022-02-18 NOTE — Progress Notes (Signed)
PROGRESS NOTE  Daniel Sharp  OEV:[id number] DOB: [birth year] DOA: 02/13/2022 PCP: [name], MD   Brief Narrative:  Patient is 88 year old male with history of paroxysmal A-fib on warfarin, osteoarthritis, mitral valve repair, hypertension, ocular myasthenia gravis who presented with complaint of bilateral lower extremity weakness.  On presentation, he was hemodynamically stable.  Patient was started on IV fluids.  Lab work showed white cell count of 13.6.  Troponins was elevated at 46.  Also found to have potassium of 3.3, creatinine of 1.7.  Patient was admitted for further management.  PT/OT recommending skilled nursing facility on discharge.  Hospital course remarkable for persistent weakness, double vision.  Neurology recommended to continue prednisone, Mestinon.  Medically stable for discharge, waiting for a skilled nursing facility bed.  Assessment & Plan:  Principal Problem:   Elevated troponin Active Problems:   H/O mitral valve replacement   Renal cell carcinoma of left kidney (HCC)   Hypertension   Hypercholesterolemia   Longstanding persistent atrial fibrillation (HCC)   Generalized weakness   Hypercalcemia   Hyperbilirubinemia   Generalized weakness : History of myasthenia gravis.  Could be exacerbation.  Takes prednisone at home 10 mg daily which we will continue.  Mestinon to be continued.  PT/OT consulted , recommended SNF. 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  Continue monitoring.  Currently kidney function might be at baseline.  Elevated troponin: Denies any chest pain.  EKG did not show any significant ischemic changes.  Troponin is elevated but flat trended.  Echocardiogram showed EF of 55%, no obvious wall motion abnormality.  Paroxysmal A-fib: Remains in A-fib, rate controlled.  Monitor on telemetry.  On warfarin for anticoagulation.  He was on  atenolol  for rate control but now discontinued due to bradycardia.  History of mitral valve replacement: On warfarin.  Monitor INR.  Goal is 2.5-3.5.  Echo showed functioning mitral valve  Hypertension: Takes benazepril, atenolol at home.  Benazepril hold due to AKI/CKD.  Atenolol also on hold due to bradycardia .Added amlodipine 5 mg daily.  History of renal cell carcinoma of left kidney: Status post nephrectomy.  Leukocytosis: Mild.  Most likely reactive from steroids.  Stable  Normocytic anemia: hemoglobin currently stable in the range of 9-11.   Nutrition Problem: Increased nutrient needs Etiology: chronic illness    DVT prophylaxis:warfarin warfarin (COUMADIN) tablet 2.5 mg     Code Status: Full Code  Family Communication: Discussed with son at bedside on 8/14  Patient status:Inpatient  Patient is from :Home  Anticipated discharge to:SNF  Estimated DC date:as soon as bed is available   Consultants: Neurology Procedures: None  Antimicrobials:  Anti-infectives (From admission, onward)    None       Subjective:  Patient seen and examined at the bedside today.  He was working with PT/OT.  Son at bedside.  He looks comfortable without any new complaints today  Objective: Vitals:   02/17/22 1422 02/17/22 2210 02/18/22 0416 02/18/22 1047  BP: (!) 111/48 (!) 116/52 122/64 (!) 115/50  Pulse: (!) 50 (!) 51 (!) 57 (!) 47  Resp: '16 20 18   '$ Temp: 97.6 F (36.4 C) 98.4 F (36.9 C) 98.9 F (37.2 C)   TempSrc: Oral Oral Oral   SpO2: 92% 91% 92% 96%  Weight:      Height:        Intake/Output Summary (Last 24 hours) at 02/18/2022 1142 Last  data filed at 02/18/2022 1003 Gross per 24 hour  Intake 600 ml  Output 1000 ml  Net -400 ml   Filed Weights   02/13/22 1108 02/13/22 1955  Weight: 89.8 kg 86.6 kg    Examination:  General exam: Overall comfortable, not in distress, weak appearing, deconditioned HEENT: PERRL Respiratory system:  no wheezes or crackles   Cardiovascular system: S1 & S2 heard, RRR.  Gastrointestinal system: Abdomen is nondistended, soft and nontender. Central nervous system: Alert and oriented Extremities: No edema, no clubbing ,no cyanosis Skin: No rashes, no ulcers,no icterus      Data Reviewed: I have personally reviewed following labs and imaging studies  CBC: Recent Labs  Lab 02/13/22 1150 02/14/22 0535 02/15/22 0717 02/18/22 0021  WBC 13.6* 11.8* 13.0* 11.5*  NEUTROABS 11.1*  --   --   --   HGB 11.5* 9.9* 9.7* 9.2*  HCT 34.7* 30.8* 29.3* 28.5*  MCV 94.8 95.4 95.1 96.0  PLT 254 200 212 801   Basic Metabolic Panel: Recent Labs  Lab 02/13/22 1150 02/14/22 0535 02/15/22 0717  NA 137 138 139  K 3.3* 3.6 3.7  CL 93* 97* 99  CO2 33* 33* 33*  GLUCOSE 115* 174* 107*  BUN 53* 48* 46*  CREATININE 1.72* 1.56* 1.54*  CALCIUM 10.8* 10.4* 10.4*     No results found for this or any previous visit (from the past 240 hour(s)).   Radiology Studies: No results found.  Scheduled Meds:  amLODipine  5 mg Oral Daily   feeding supplement  237 mL Oral BID BM   predniSONE  10 mg Oral Daily   pyridostigmine  60 mg Oral Q8H   tamsulosin  0.4 mg Oral Daily   warfarin  2.5 mg Oral ONCE-1600   Warfarin - Pharmacist Dosing Inpatient   Does not apply q1600   Continuous Infusions:   LOS: 0 days   Shelly Coss, MD [facility] P8/14/2023, 11:42 AM

## 2022-02-18 NOTE — TOC Progression Note (Signed)
Transition of Care [facility]) - Progression Note    Patient Details  Name: Keylon Labelle MRN: [id number] Date of Birth: 1932/09/08  [facility]) CM/SW [place], LCSW Phone Number: 02/18/2022, 9:44 AM  Clinical Narrative:    CSW spoke with [place] from [facility], the facility does have a bed and [place] will start insurance auth. CSW discussed the process with pt's grandson Dunken to inform him it may take a day or two for the approval of the insurance auth.TOC to follow.     Expected Discharge Plan: [place] Barriers to Discharge: Continued Medical Work up  Expected Discharge Plan and Services Expected Discharge Plan: [place] In-house Referral: NA Discharge Planning Services: NA Post Acute Care Choice: [place] Living arrangements for the past 2 months: Single Family Home                 DME Arranged: N/A DME Agency: NA       HH Arranged: NA HH Agency: NA         Social Determinants of Health (SDOH) Interventions    Readmission Risk Interventions     No data to display

## 2022-02-18 NOTE — Progress Notes (Signed)
[place] for Warfarin Indication: history of atrial fibrillation  Allergies  Allergen Reactions   Mestinon [Pyridostigmine Bromide] Diarrhea    Patient Measurements: Height: '6\' 1"'$  (185.4 cm) Weight: 86.6 kg (190 lb 14.7 oz) IBW/kg (Calculated) : 79.9  Vital Signs: Temp: 98.9 F (37.2 C) (08/14 0416) Temp Source: Oral (08/14 0416) BP: 122/64 (08/14 0416) Pulse Rate: 57 (08/14 0416)  Labs: Recent Labs    02/16/22 0545 02/17/22 0808 02/18/22 0021  HGB  --   --  9.2*  HCT  --   --  28.5*  PLT  --   --  238  LABPROT 30.6* 31.8* 31.5*  INR 3.0* 3.1* 3.1*    Estimated Creatinine Clearance: 38.2 mL/min (A) (by C-G formula based on SCr of 1.54 mg/dL (H)).   Medications:  PTA warfarin (5 mg daily except 2.5 mg on Saturday)  Assessment: 29 YOM with myasthenia gravis presenting to ED for generalized weakness. Patient noted falling PTA but denies head trauma. Patient is on warfarin PTA for hx of afib. Pharmacy is consulted for dosing warfarin inpatient.   Today, 02/18/22: - INR 3.1, slightly supratherapeutic with reduced dose 2.5 mg given yesterday evening - Hgb 9.2, slightly low and down marginally from last CBC on 8/11 - Plt WNL - No bleeding documented - No significant DDIs - Heart healthy diet ordered, documented eating 80%  Goal of Therapy:  INR 2-3 per anticoagulation clinic (last visit: 8/2) Monitor platelets by anticoagulation protocol: Yes   Plan:  - Warfarin 2.5 mg PO x 1 tonight - Daily INR - CBC every 72 hours, monitor HGB and Plt  [place], PharmD Candidate 02/18/2022 7:43 AM

## 2022-02-19 DIAGNOSIS — R778 Other specified abnormalities of plasma proteins: Secondary | ICD-10-CM | POA: Diagnosis not present

## 2022-02-19 LAB — PROTIME-INR
INR: 2.4 — ABNORMAL HIGH (ref 0.8–1.2)
Prothrombin Time: 26 seconds — ABNORMAL HIGH (ref 11.4–15.2)

## 2022-02-19 MED ORDER — HYDROCODONE-ACETAMINOPHEN 5-325 MG PO TABS: 1.0000 | ORAL_TABLET | Freq: Four times a day (QID) | ORAL | 0 refills | Status: DC | PRN

## 2022-02-19 MED ORDER — PYRIDOSTIGMINE BROMIDE 60 MG PO TABS: 60.0000 mg | ORAL_TABLET | Freq: Three times a day (TID) | ORAL | Status: AC

## 2022-02-19 MED ORDER — WARFARIN SODIUM 5 MG PO TABS: 5.0000 mg | ORAL_TABLET | Freq: Once | ORAL | Status: DC

## 2022-02-19 MED ORDER — AMLODIPINE BESYLATE 5 MG PO TABS: 5.0000 mg | ORAL_TABLET | Freq: Every day | ORAL | Status: DC

## 2022-02-19 NOTE — Discharge Summary (Addendum)
Physician Discharge Summary  Daniel Sharp ZOX:[id number] DOB: [birth year] DOA: 02/13/2022  PCP: [name], MD  Admit date: 02/13/2022 Discharge date: 02/19/2022  Admitted From: Home Disposition:  SNF  Discharge Condition:Stable CODE STATUS:FULL Diet recommendation: Heart Healthy  Brief/Interim Summary:  Patient is 88 year old male with history of paroxysmal A-fib on warfarin, osteoarthritis, mitral valve repair, hypertension, ocular myasthenia gravis who presented with complaint of bilateral lower extremity weakness.  On presentation, he was hemodynamically stable.  Patient was started on IV fluids.  Lab work showed white cell count of 13.6.  Troponins was elevated at 46.  Also found to have potassium of 3.3, creatinine of 1.7.  Patient was admitted for further management.  PT/OT recommending skilled nursing facility on discharge.  Hospital course remarkable for persistent weakness, double vision.  Neurology recommended to continue prednisone, Mestinon.  We recommend to follow-up with neurology as an outpatient.  Medically stable for discharge to  SNF today.  Following problems were addressed during his hospitalization:  Generalized weakness : History of myasthenia gravis.  Could be exacerbation.  Takes prednisone at home 10 mg daily which we will continue.  Mestinon to be continued.  PT/OT consulted , recommended SNF.Neurology recommended current management and outpatient follow-up.   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  Continue monitoring.  Currently kidney function might be at baseline.   Elevated troponin: Denies any chest pain.  EKG did not show any significant ischemic changes.  Troponin is elevated but flat trended.  Echocardiogram showed EF of 55%, no obvious wall motion abnormality.   Paroxysmal A-fib: Remains in A-fib, rate controlled.  Monitor on telemetry.  On warfarin for anticoagulation.  He was on  atenolol  for rate control but now discontinued due to bradycardia.   History of mitral valve replacement: On warfarin.  Monitor INR.  Goal is 2.5-3.5.  Echo showed normal functioning mitral valve   Hypertension: Takes benazepril, atenolol, hydrochlorothiazide at home.  Benazepril hold due to AKI/CKD.  Atenolol also on hold due to bradycardia .continue amlodipine 5 mg daily.   History of renal cell carcinoma of left kidney: Status post nephrectomy.   Leukocytosis: Mild.  Most likely reactive from steroids.  Stable   Normocytic anemia: hemoglobin currently stable in the range of 9-11.      Discharge Diagnoses:  Principal Problem:   Elevated troponin Active Problems:   H/O mitral valve replacement   Renal cell carcinoma of left kidney (HCC)   Hypertension   Hypercholesterolemia   Longstanding persistent atrial fibrillation (HCC)   Generalized weakness   Hypercalcemia   Hyperbilirubinemia    Discharge Instructions  Discharge Instructions     Diet - low sodium heart healthy   Complete by: As directed    Discharge instructions   Complete by: As directed    1)Please take prescribed medications as instructed 2)Follow up with your neurologist in 2 weeks   Increase activity slowly   Complete by: As directed    No wound care   Complete by: As directed       Allergies as of 02/19/2022   No Active Allergies      Medication List     STOP taking these medications    atenolol 25 MG tablet Commonly known as: TENORMIN   benazepril 40 MG tablet Commonly known as: LOTENSIN   hydrochlorothiazide 25 MG tablet Commonly known as: HYDRODIURIL       TAKE these medications  amLODipine 5 MG tablet Commonly known as: NORVASC Take 1 tablet (5 mg total) by mouth daily. Start taking on: February 20, 2022   docusate sodium 100 MG capsule Commonly known as: COLACE Take 100 mg by mouth at bedtime.   docusate sodium 100 MG capsule Commonly known as: COLACE Take 1 capsule (100 mg  total) by mouth 2 (two) times daily.   furosemide 20 MG tablet Commonly known as: LASIX Take 20 mg by mouth daily. What changed: Another medication with the same name was removed. Continue taking this medication, and follow the directions you see here.   glucosamine-chondroitin 500-400 MG tablet Take 1 tablet by mouth daily.   HYDROcodone-acetaminophen 5-325 MG tablet Commonly known as: NORCO/VICODIN Take 1-2 tablets by mouth every 6 (six) hours as needed for moderate pain or severe pain.   predniSONE 5 MG tablet Commonly known as: DELTASONE Take 2 tablets (10 mg total) by mouth daily.   promethazine 12.5 MG tablet Commonly known as: PHENERGAN Take 1 tablet (12.5 mg total) by mouth every 4 (four) hours as needed for nausea or vomiting.   pyridostigmine 60 MG tablet Commonly known as: Mestinon Take 1 tablet (60 mg total) by mouth every 8 (eight) hours. What changed:  how much to take how to take this when to take this additional instructions   tamsulosin 0.4 MG Caps capsule Commonly known as: FLOMAX Take 0.4 mg by mouth daily.   VITAMIN D3 PO Take 1 tablet by mouth daily.   warfarin 5 MG tablet Commonly known as: COUMADIN Take as directed. If you are unsure how to take this medication, talk to your nurse or doctor. Original instructions: Take 2.5-5 mg by mouth as directed. Take 1 tablet (5 mg) everyday except on Saturdays Take 1/2 tablet (2.5 mg)        Contact information for after-discharge care     Destination     [facility] .   Service: Skilled Nursing Contact information: [address] [place] [phone]                    No Active Allergies  Consultations: Neurology   Procedures/Studies: ECHOCARDIOGRAM COMPLETE  Result Date: 02/14/2022    ECHOCARDIOGRAM REPORT   Patient Name:   Daniel Sharp Date of Exam: 02/14/2022 Medical Rec #:  [id number]   Height:       73.0 in Accession #:     [id number]  Weight:       190.9 lb Date of Birth:  February 07, 1933   BSA:          2.110 m Patient Age:    29 years    BP:           153/77 mmHg Patient Gender: M           HR:           69 bpm. Exam Location:  Inpatient Procedure: 2D Echo, Color Doppler and Cardiac Doppler Indications:    I51.7 Cardiomegaly  History:        Patient has prior history of Echocardiogram examinations, most                 recent 10/05/2021. Arrythmias:Atrial Fibrillation; Risk                 Factors:Hypertension and Dyslipidemia. MV Repair ~2006.                  Mitral Valve: prosthetic annuloplasty ring valve  is present in                 the mitral position.  Sonographer:    Raquel Sarna Senior RDCS Referring Phys: [phone] [place]  Sonographer Comments: Technically difficult study due to poor echo windows. Suboptimal apical windows. IMPRESSIONS  1. Left ventricular ejection fraction, by estimation, is 55%. The left ventricle has normal function. Left ventricular endocardial border not optimally defined to evaluate regional wall motion. There is moderate asymmetric left ventricular hypertrophy of the septal segment. Left ventricular diastolic parameters are indeterminate.  2. Respiratory related septal shift.  3. Right ventricular systolic function is mildly reduced. The right ventricular size is mildly enlarged. There is moderately elevated pulmonary artery systolic pressure. The estimated right ventricular systolic pressure is 77.4 mmHg.  4. Left atrial size was severely dilated.  5. Right atrial size was moderately dilated.  6. The mitral valve has been repaired/replaced. Mild to moderate mitral valve regurgitation. No evidence of mitral stenosis. The mean mitral valve gradient is 2.3 mmHg with average heart rate of 71 bpm. There is a prosthetic annuloplasty ring present in  the mitral position. Echo findings are consistent with normal structure and function of the mitral valve prosthesis.  7. Tricuspid valve regurgitation is mild  to moderate.  8. The aortic valve is grossly normal. There is mild calcification of the aortic valve. Aortic valve regurgitation is not visualized. No aortic stenosis is present.  9. The inferior vena cava is dilated in size with >50% respiratory variability, suggesting right atrial pressure of 8 mmHg. FINDINGS  Left Ventricle: Left ventricular ejection fraction, by estimation, is 55%. The left ventricle has normal function. Left ventricular endocardial border not optimally defined to evaluate regional wall motion. The left ventricular internal cavity size was normal in size. There is moderate asymmetric left ventricular hypertrophy of the septal segment. Abnormal (paradoxical) septal motion consistent with post-operative status. Left ventricular diastolic parameters are indeterminate. Right Ventricle: The right ventricular size is mildly enlarged. No increase in right ventricular wall thickness. Right ventricular systolic function is mildly reduced. There is moderately elevated pulmonary artery systolic pressure. The tricuspid regurgitant velocity is 3.37 m/s, and with an assumed right atrial pressure of 8 mmHg, the estimated right ventricular systolic pressure is 12.8 mmHg. Left Atrium: Left atrial size was severely dilated. Right Atrium: Right atrial size was moderately dilated. Pericardium: There is no evidence of pericardial effusion. Mitral Valve: The mitral valve has been repaired/replaced. Mild to moderate mitral valve regurgitation. There is a prosthetic annuloplasty ring present in the mitral position. Echo findings are consistent with normal structure and function of the mitral valve prosthesis. No evidence of mitral valve stenosis. MV peak gradient, 6.1 mmHg. The mean mitral valve gradient is 2.3 mmHg with average heart rate of 71 bpm. Tricuspid Valve: The tricuspid valve is normal in structure. Tricuspid valve regurgitation is mild to moderate. No evidence of tricuspid stenosis. Aortic Valve: The aortic  valve is grossly normal. There is mild calcification of the aortic valve. Aortic valve regurgitation is not visualized. No aortic stenosis is present. Pulmonic Valve: The pulmonic valve was normal in structure. Pulmonic valve regurgitation is trivial. No evidence of pulmonic stenosis. Aorta: The aortic root is normal in size and structure. Ascending aorta measurements are within normal limits for age when indexed to body surface area. Venous: The inferior vena cava is dilated in size with greater than 50% respiratory variability, suggesting right atrial pressure of 8 mmHg. IAS/Shunts: No atrial level shunt  detected by color flow Doppler.  LEFT VENTRICLE PLAX 2D LVIDd:         5.60 cm LVIDs:         4.00 cm LV PW:         1.00 cm LV IVS:        1.30 cm LVOT diam:     2.40 cm LV SV:         84 LV SV Index:   40 LVOT Area:     4.52 cm  RIGHT VENTRICLE RV S prime:     9.57 cm/s TAPSE (M-mode): 1.2 cm LEFT ATRIUM              Index        RIGHT ATRIUM           Index LA diam:        4.40 cm  2.09 cm/m   RA Area:     27.80 cm LA Vol (A2C):   128.0 ml 60.68 ml/m  RA Volume:   88.30 ml  41.86 ml/m LA Vol (A4C):   85.6 ml  40.58 ml/m LA Biplane Vol: 103.0 ml 48.83 ml/m  AORTIC VALVE LVOT Vmax:   91.25 cm/s LVOT Vmean:  62.900 cm/s LVOT VTI:    0.186 m  AORTA Ao Root diam: 3.80 cm Ao Asc diam:  3.50 cm MITRAL VALVE                TRICUSPID VALVE MV Area (PHT): 3.28 cm     TR Peak grad:   45.4 mmHg MV Area VTI:   2.40 cm     TR Vmax:        337.00 cm/s MV Peak grad:  6.1 mmHg MV Mean grad:  2.3 mmHg     SHUNTS MV Vmax:       1.23 m/s     Systemic VTI:  0.19 m MV Vmean:      57.1 cm/s    Systemic Diam: 2.40 cm MV Decel Time: 231 msec MV E velocity: 116.00 cm/s  Cherlynn Kaiser MD Electronically signed by Cherlynn Kaiser MD Signature Date/Time: 02/14/2022/11:54:21 AM    Final    DG Chest 2 View  Result Date: 02/13/2022 CLINICAL DATA:  Weakness. EXAM: CHEST - 2 VIEW COMPARISON:  July 05, 2021. FINDINGS: Stable  cardiomegaly. Status post cardiac valve repair. Mild central pulmonary vascular congestion is noted. No acute pulmonary disease is noted. Bony thorax is unremarkable. IMPRESSION: Stable cardiomegaly with mild central pulmonary vascular congestion. Electronically Signed   By: Marijo Conception M.D.   On: 02/13/2022 12:31   CT HEAD WO CONTRAST  Result Date: 01/27/2022 CLINICAL DATA:  Neuro deficit. EXAM: CT HEAD WITHOUT CONTRAST TECHNIQUE: Contiguous axial images were obtained from the base of the skull through the vertex without intravenous contrast. RADIATION DOSE REDUCTION: This exam was performed according to the departmental dose-optimization program which includes automated exposure control, adjustment of the mA and/or kV according to patient size and/or use of iterative reconstruction technique. COMPARISON:  07/05/2021 FINDINGS: Brain: There is no evidence for acute hemorrhage, hydrocephalus, mass lesion, or abnormal extra-axial fluid collection. No definite CT evidence for acute infarction. Diffuse loss of parenchymal volume is consistent with atrophy. Patchy low attenuation in the deep hemispheric and periventricular white matter is nonspecific, but likely reflects chronic microvascular ischemic demyelination. Small extra-axial lesion measuring about 9 by 3 mm identified in the right frontal region (coronal 26/5 and axial 23/2) demonstrates some calcification. Imaging features compatible  with meningioma. This is unchanged in the interval and generates no mass-effect or adjacent edema. Vascular: No hyperdense vessel or unexpected calcification. Skull: No evidence for fracture. No worrisome lytic or sclerotic lesion. Sinuses/Orbits: The visualized paranasal sinuses and mastoid air cells are clear. Visualized portions of the globes and intraorbital fat are unremarkable. Other: None. IMPRESSION: 1. No acute intracranial abnormality. 2. Stable tiny right frontal meningioma without mass-effect or adjacent edema.  3. Atrophy with chronic small vessel ischemic disease. Electronically Signed   By: Misty Stanley M.D.   On: 01/27/2022 14:03      Subjective: Patient seen and examined at the bedside this morning.  Hemodynamically stable for discharge to skilled nursing facility  Discharge Exam: Vitals:   02/18/22 2317 02/19/22 0658  BP: (!) 124/55 (!) 126/59  Pulse: (!) 48 (!) 51  Resp: 16 16  Temp: 97.9 F (36.6 C) 97.7 F (36.5 C)  SpO2: 97% 96%   Vitals:   02/18/22 1047 02/18/22 1349 02/18/22 2317 02/19/22 0658  BP: (!) 115/50 (!) 128/50 (!) 124/55 (!) 126/59  Pulse: (!) 47 (!) 48 (!) 48 (!) 51  Resp:  '14 16 16  '$ Temp:  97.7 F (36.5 C) 97.9 F (36.6 C) 97.7 F (36.5 C)  TempSrc:  Oral Oral Oral  SpO2: 96% 94% 97% 96%  Weight:      Height:        General: Pt is alert, awake, not in acute distress Cardiovascular: RRR, S1/S2 +, no rubs, no gallops Respiratory: CTA bilaterally, no wheezing, no rhonchi Abdominal: Soft, NT, ND, bowel sounds + Extremities: no edema, no cyanosis    The results of significant diagnostics from this hospitalization (including imaging, microbiology, ancillary and laboratory) are listed below for reference.     Microbiology: No results found for this or any previous visit (from the past 240 hour(s)).   Labs: BNP (last 3 results) No results for input(s): "BNP" in the last 8760 hours. Basic Metabolic Panel: Recent Labs  Lab 02/13/22 1150 02/14/22 0535 02/15/22 0717  NA 137 138 139  K 3.3* 3.6 3.7  CL 93* 97* 99  CO2 33* 33* 33*  GLUCOSE 115* 174* 107*  BUN 53* 48* 46*  CREATININE 1.72* 1.56* 1.54*  CALCIUM 10.8* 10.4* 10.4*   Liver Function Tests: Recent Labs  Lab 02/13/22 1150 02/14/22 0535  AST 18 15  ALT 15 12  ALKPHOS 63 51  BILITOT 1.5* 1.2  PROT 7.6 6.1*  ALBUMIN 4.1 3.3*   No results for input(s): "LIPASE", "AMYLASE" in the last 168 hours. No results for input(s): "AMMONIA" in the last 168 hours. CBC: Recent Labs  Lab  02/13/22 1150 02/14/22 0535 02/15/22 0717 02/18/22 0021  WBC 13.6* 11.8* 13.0* 11.5*  NEUTROABS 11.1*  --   --   --   HGB 11.5* 9.9* 9.7* 9.2*  HCT 34.7* 30.8* 29.3* 28.5*  MCV 94.8 95.4 95.1 96.0  PLT 254 200 212 238   Cardiac Enzymes: No results for input(s): "CKTOTAL", "CKMB", "CKMBINDEX", "TROPONINI" in the last 168 hours. BNP: Invalid input(s): "POCBNP" CBG: No results for input(s): "GLUCAP" in the last 168 hours. D-Dimer No results for input(s): "DDIMER" in the last 72 hours. Hgb A1c No results for input(s): "HGBA1C" in the last 72 hours. Lipid Profile No results for input(s): "CHOL", "HDL", "LDLCALC", "TRIG", "CHOLHDL", "LDLDIRECT" in the last 72 hours. Thyroid function studies No results for input(s): "TSH", "T4TOTAL", "T3FREE", "THYROIDAB" in the last 72 hours.  Invalid input(s): "FREET3" Anemia work up No  results for input(s): "VITAMINB12", "FOLATE", "FERRITIN", "TIBC", "IRON", "RETICCTPCT" in the last 72 hours. Urinalysis    Component Value Date/Time   COLORURINE STRAW (A) 02/13/2022 1319   APPEARANCEUR CLEAR 02/13/2022 1319   LABSPEC 1.009 02/13/2022 1319   PHURINE 6.0 02/13/2022 1319   GLUCOSEU NEGATIVE 02/13/2022 1319   HGBUR MODERATE (A) 02/13/2022 1319   BILIRUBINUR NEGATIVE 02/13/2022 1319   KETONESUR NEGATIVE 02/13/2022 1319   PROTEINUR NEGATIVE 02/13/2022 1319   NITRITE NEGATIVE 02/13/2022 1319   LEUKOCYTESUR NEGATIVE 02/13/2022 1319   Sepsis Labs Recent Labs  Lab 02/13/22 1150 02/14/22 0535 02/15/22 0717 02/18/22 0021  WBC 13.6* 11.8* 13.0* 11.5*   Microbiology No results found for this or any previous visit (from the past 240 hour(s)).  Please note: You were cared for by a hospitalist during your hospital stay. Once you are discharged, your primary care physician will handle any further medical issues. Please note that NO REFILLS for any discharge medications will be authorized once you are discharged, as it is imperative that you return  to your primary care physician (or establish a relationship with a primary care physician if you do not have one) for your post hospital discharge needs so that they can reassess your need for medications and monitor your lab values.    Time coordinating discharge: 40 minutes  SIGNED:   Shelly Coss, MD  [facility] 02/19/2022, 11:53 AM Pager [phone]  If 7PM-7AM, please contact night-coverage [email or web address] Password [email or web address]

## 2022-02-19 NOTE — Progress Notes (Signed)
Pt discharged to [facility].  Report called and given to Otila Kluver, LPN at [facility]. All of nurse's questions answered to her satisfaction. Pt left unit on stretcher pushed by ambulance staff. Left in stable condition.

## 2022-02-19 NOTE — Progress Notes (Signed)
[place] for warfarin Indication: history of atrial fibrillation  No Active Allergies  Patient Measurements: Height: '6\' 1"'$  (185.4 cm) Weight: 86.6 kg (190 lb 14.7 oz) IBW/kg (Calculated) : 79.9  Vital Signs: Temp: 97.7 F (36.5 C) (08/15 0658) Temp Source: Oral (08/15 0658) BP: 126/59 (08/15 0658) Pulse Rate: 51 (08/15 0658)  Labs: Recent Labs    02/17/22 0808 02/18/22 0021 02/19/22 0731  HGB  --  9.2*  --   HCT  --  28.5*  --   PLT  --  238  --   LABPROT 31.8* 31.5* 26.0*  INR 3.1* 3.1* 2.4*    Estimated Creatinine Clearance: 38.2 mL/min (A) (by C-G formula based on SCr of 1.54 mg/dL (H)).   Medications:  PTA warfarin (5 mg daily except for 2.5 on Saturday)  Assessment: 63 YOM with myasthenia gravis presenting to ED for generalized weakness. Patient noted falling PTA but denies head trauma. Patient is on warfarin PTA for hx of afib. Pharmacy is consulted for dosing warfarin inpatient.   Today, 02/19/22: - INR 2.4, therapeutic  - Reduced dose (2.5 mg) given yesterday and Sunday  - Regularly scheduled 2.5 mg dose given per PTA regimen on 8/12  - Dose held on 8/10 - Last CBC on 8/14: Hgb low but stable, Plt WNL - No bleeding documented - No significant DDIs - Heart healthy diet ordered, documented as consuming 50% of last meal  Goal of Therapy:  INR 2-3 per anticoagulation clinic (last visit: 8/2) Monitor platelets by anticoagulation protocol: Yes   Plan:  - Warfarin 5 mg PO x 1 tonight - Daily INR while inpatient - CBC every 72 hrs while inpatient, monitor Hgb and Plt - Continue PTA warfarin regimen on discharge - Have patient follow up with anticoagulation clinic in 3 days for INR check  [place], PharmD Candidate 02/19/2022 8:45 AM

## 2022-02-19 NOTE — Plan of Care (Signed)

## 2022-02-19 NOTE — TOC Transition Note (Addendum)
Transition of Care [facility]) - CM/SW Discharge Note   Patient Details  Name: Daniel Sharp MRN: [id number] Date of Birth: 07/19/1932  [facility] CM/SW Contact:  Ross Ludwig, LCSW Phone Number: 02/19/2022, 1:17 PM   Clinical Narrative:     CSW was informed that patient has been approved for SNF at [facility].  CSW spoke to patient's grandson [name], [phone].    Patient to be d/c'ed today to [facility] SNF room 608P.  Patient and family agreeable to plans will transport via ems RN to call report to [phone].    Patient's grandson was at bedside and aware that patient is discharging today.      Final next level of care: Skilled Nursing Facility Barriers to Discharge: Barriers Resolved   Patient Goals and CMS Choice Patient states their goals for this hospitalization and ongoing recovery are:: To return back home after therapy. CMS Medicare.gov Compare Post Acute Care list provided to:: Patient Represenative (must comment) Choice offered to / list presented to : Patient (Patient's grandson.)  Discharge Placement   Existing PASRR number confirmed : 02/15/22          Patient chooses bed at: [facility] Patient to be transferred to facility by: [facility] Name of family member notified: Elwin Mocha [phone] Patient and family notified of of transfer: 02/19/22  Discharge Plan and Services In-house Referral: NA Discharge Planning Services: NA Post Acute Care Choice: [place]          DME Arranged: N/A DME Agency: NA       HH Arranged: NA HH Agency: NA        Social Determinants of Health (SDOH) Interventions     Readmission Risk Interventions     No data to display

## 2022-02-20 ENCOUNTER — Telehealth: Payer: Self-pay | Admitting: Neurology

## 2022-02-20 NOTE — Telephone Encounter (Signed)
OK to keep appointment on 8/23.

## 2022-02-20 NOTE — Telephone Encounter (Signed)
Patient seen at ED and needed follow up in one week. Scheduled for 01/27/22 at 7:50 AM.   Please advise if too soon or if there is a better slot for the patient that is later in the day.

## 2022-02-25 ENCOUNTER — Telehealth: Payer: Self-pay | Admitting: Family Medicine

## 2022-02-25 NOTE — Telephone Encounter (Signed)
Noted  

## 2022-02-25 NOTE — Telephone Encounter (Signed)
Caller states: -pt is currently at Rehabilitation facility, [facility] facility is monitoring Coumadin -08/23 appointment with RN should be cancelled. -He is available with any follow up questions.

## 2022-02-27 ENCOUNTER — Ambulatory Visit: Payer: Medicare HMO

## 2022-02-27 ENCOUNTER — Ambulatory Visit: Payer: Medicare HMO | Admitting: Neurology

## 2022-03-04 ENCOUNTER — Ambulatory Visit: Payer: Medicare HMO | Admitting: Neurology

## 2022-03-04 ENCOUNTER — Encounter: Payer: Self-pay | Admitting: Neurology

## 2022-03-04 VITALS — BP 133/67 | HR 104 | Wt 192.0 lb

## 2022-03-04 DIAGNOSIS — G7000 Myasthenia gravis without (acute) exacerbation: Secondary | ICD-10-CM

## 2022-03-04 NOTE — Progress Notes (Signed)
Follow-up Visit   Date: 03/04/2022    Daniel Sharp MRN: [id number] DOB: 11/26/1932    Daniel Sharp is a 88 y.o. right-handed Caucasian male with atrial fibrillation, s/p MVR, hypertension, left kidney mass s/p nephrectomy, and tobacco returning to the clinic for follow-up of ocular seropositive myasthenia gravis s/p thymectomy.  The patient was accompanied to the clinic by Daniel Sharp, who also provides collateral information.    IMPRESSION/PLAN: Ocular myasthenia gravis s/p thymectomy, AChR positive, diagnosed 45.  Clinically with intermittent diplopia, no ophthalmoplegia or weakness on exam.  - Continue prednisone '10mg'$  daily  - Continue mestinon '60mg'$  three times daily  Return to clinic in 4 months  --------------------------------------------- History of present illness: He has a long history of ocular myasthenia gravis which was diagnosed in 57 at [facility].  Clinic notes from prior neurologist indicate he may have been antibody positive.  He underwent thymectomy and feels that it may have helped some.  Since this time, he has intermittent and fluctuating right > left eyelid droopiness and double vision.  He has been on mestinon in the past, which was helpful, as well as prednisone.  The highest dose of prednisone is '30mg'$ . He currently takes prednisone '10mg'$  since June.   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  Patient was concerned that prednisone caused abdominal adhesions and wanted to explore other medication options. He denies problems with speech/swallow or limb weakness.     He has been living in [place] with his son for the past 5 month and previously in [place].  He uses a wheelchair and walker because of orthopeadic issues.   UPDATE 03/04/2022:  He was hospitalized in August soon after his last visit for bilateral leg weakness.  During his admission, he was started on mestinon.  He was found to have bradycardia and atenolol was  stopped.  His leg weakness quickly improved where he was able to stand and walk with a walker.  He was discharged to rehab facility where he continues to ambulate with a walker.  Prior to his kidney surgery, he was ambulating with a cane and very independent.  He is planning on staying with son until he is well enough to be independent again.  He continues to have intermittent double vision and has stopped reading because of this. No problems with speech, swallow, or new limb weakness.     Medications:  Current Outpatient Medications on File Prior to Visit  Medication Sig Dispense Refill   amLODipine (NORVASC) 5 MG tablet Take 1 tablet (5 mg total) by mouth daily.     docusate sodium (COLACE) 100 MG capsule Take 1 capsule (100 mg total) by mouth 2 (two) times daily.     Cholecalciferol (VITAMIN D3 PO) Take 1 tablet by mouth daily.     docusate sodium (COLACE) 100 MG capsule Take 100 mg by mouth at bedtime. (Patient not taking: Reported on 03/04/2022)     furosemide (LASIX) 20 MG tablet Take 20 mg by mouth daily.     glucosamine-chondroitin 500-400 MG tablet Take 1 tablet by mouth daily.     HYDROcodone-acetaminophen (NORCO/VICODIN) 5-325 MG tablet Take 1-2 tablets by mouth every 6 (six) hours as needed for moderate pain or severe pain. 15 tablet 0   predniSONE (DELTASONE) 5 MG tablet Take 2 tablets (10 mg total) by mouth daily. 180 tablet 1   promethazine (PHENERGAN) 12.5 MG tablet Take 1 tablet (12.5 mg total) by mouth every 4 (four) hours as  needed for nausea or vomiting. 15 tablet 0   pyridostigmine (MESTINON) 60 MG tablet Take 1 tablet (60 mg total) by mouth every 8 (eight) hours.     tamsulosin (FLOMAX) 0.4 MG CAPS capsule Take 0.4 mg by mouth daily.     warfarin (COUMADIN) 5 MG tablet Take 2.5-5 mg by mouth as directed. Take 1 tablet (5 mg) every Mon. Weds.fri  sat sun     No current facility-administered medications on file prior to visit.    Allergies: No Active Allergies  Vital  Signs:  BP 133/67   Pulse (!) 104   Wt 192 lb (87.1 kg)   SpO2 93%   BMI 25.33 kg/m   Neurological Exam: MENTAL STATUS including orientation to time, place, person, recent and remote memory, attention span and concentration, language, and fund of knowledge is normal.  Speech is not dysarthric.  CRANIAL NERVES:  No visual field defects.  Pupils equal round and reactive to light.  Normal conjugate, extra-ocular eye movements in all directions of gaze.  Mild bilateral ptosis at rest, no worsening with sustained upgaze.  Face is symmetric. Facial muscles are 5/5.  Palate elevates symmetrically.  Tongue is midline.  MOTOR:  Motor strength is 5/5 in all extremities.  No atrophy, fasciculations or abnormal movements.  No pronator drift.  Tone is normal.    COORDINATION/GAIT: Gait not tested, patient in wheelchair  Total time spent reviewing records, interview, history/exam, documentation, and coordination of care on day of encounter:  30 min    Thank you for allowing me to participate in patient's care.  If I can answer any additional questions, I would be pleased to do so.    Sincerely,    Davidson Palmieri K. Posey Pronto, DO

## 2022-03-04 NOTE — Patient Instructions (Signed)
Recommend that you start using an eye patch when reading

## 2022-03-18 ENCOUNTER — Telehealth: Payer: Self-pay

## 2022-03-18 NOTE — Telephone Encounter (Signed)
Contacted Dirk, pt's son, concerning warfarin management. He reports pt is now d/c from SNF. He reports pt is doing well and would like to set up apt for INR check.  Scheduled pt for 9/20. Advised if anything changes to contact office. Dirk verbalized understanding.

## 2022-03-21 ENCOUNTER — Ambulatory Visit: Payer: Medicare HMO | Admitting: Family Medicine

## 2022-03-21 ENCOUNTER — Encounter: Payer: Self-pay | Admitting: Family Medicine

## 2022-03-21 NOTE — Progress Notes (Signed)
Urine Dipstick Results from [facility]  Color: yellow Appearance: Clear SG: 1.015 Ph: <=5.0 Glucose: neg Bili: neg Ketone: neg Blood: neg Protein: neg URO: 0.2 Nitrate: neg Leukocyte Esterase: neg

## 2022-03-27 ENCOUNTER — Ambulatory Visit (INDEPENDENT_AMBULATORY_CARE_PROVIDER_SITE_OTHER): Payer: Medicare HMO | Admitting: Family Medicine

## 2022-03-27 ENCOUNTER — Other Ambulatory Visit: Payer: Self-pay | Admitting: *Deleted

## 2022-03-27 ENCOUNTER — Ambulatory Visit (INDEPENDENT_AMBULATORY_CARE_PROVIDER_SITE_OTHER): Payer: Medicare HMO

## 2022-03-27 ENCOUNTER — Telehealth: Payer: Self-pay | Admitting: *Deleted

## 2022-03-27 ENCOUNTER — Encounter: Payer: Self-pay | Admitting: Family Medicine

## 2022-03-27 VITALS — BP 140/60 | HR 100 | Temp 98.2°F | Ht 73.0 in | Wt 195.5 lb

## 2022-03-27 DIAGNOSIS — R600 Localized edema: Secondary | ICD-10-CM

## 2022-03-27 DIAGNOSIS — Z7901 Long term (current) use of anticoagulants: Secondary | ICD-10-CM

## 2022-03-27 DIAGNOSIS — G7000 Myasthenia gravis without (acute) exacerbation: Secondary | ICD-10-CM | POA: Diagnosis not present

## 2022-03-27 DIAGNOSIS — C642 Malignant neoplasm of left kidney, except renal pelvis: Secondary | ICD-10-CM

## 2022-03-27 DIAGNOSIS — I10 Essential (primary) hypertension: Secondary | ICD-10-CM

## 2022-03-27 DIAGNOSIS — D582 Other hemoglobinopathies: Secondary | ICD-10-CM

## 2022-03-27 DIAGNOSIS — I4819 Other persistent atrial fibrillation: Secondary | ICD-10-CM

## 2022-03-27 DIAGNOSIS — R531 Weakness: Secondary | ICD-10-CM

## 2022-03-27 DIAGNOSIS — Z23 Encounter for immunization: Secondary | ICD-10-CM | POA: Diagnosis not present

## 2022-03-27 DIAGNOSIS — D500 Iron deficiency anemia secondary to blood loss (chronic): Secondary | ICD-10-CM

## 2022-03-27 LAB — CBC WITH DIFFERENTIAL/PLATELET
Basophils Absolute: 0 10*3/uL (ref 0.0–0.1)
Basophils Relative: 0.4 % (ref 0.0–3.0)
Eosinophils Absolute: 0.1 10*3/uL (ref 0.0–0.7)
Eosinophils Relative: 0.6 % (ref 0.0–5.0)
HCT: 23 % — CL (ref 39.0–52.0)
Hemoglobin: 7.7 g/dL — CL (ref 13.0–17.0)
Lymphocytes Relative: 14.1 % (ref 12.0–46.0)
Lymphs Abs: 1.3 10*3/uL (ref 0.7–4.0)
MCHC: 33.5 g/dL (ref 30.0–36.0)
MCV: 89.4 fl (ref 78.0–100.0)
Monocytes Absolute: 0.7 10*3/uL (ref 0.1–1.0)
Monocytes Relative: 7.6 % (ref 3.0–12.0)
Neutro Abs: 7 10*3/uL (ref 1.4–7.7)
Neutrophils Relative %: 77.3 % — ABNORMAL HIGH (ref 43.0–77.0)
Platelets: 243 10*3/uL (ref 150.0–400.0)
RBC: 2.57 Mil/uL — ABNORMAL LOW (ref 4.22–5.81)
RDW: 15.5 % (ref 11.5–15.5)
WBC: 9.1 10*3/uL (ref 4.0–10.5)

## 2022-03-27 LAB — IBC + FERRITIN
Ferritin: 135 ng/mL (ref 22.0–322.0)
Iron: 24 ug/dL — ABNORMAL LOW (ref 42–165)
Saturation Ratios: 12 % — ABNORMAL LOW (ref 20.0–50.0)
TIBC: 200.2 ug/dL — ABNORMAL LOW (ref 250.0–450.0)
Transferrin: 143 mg/dL — ABNORMAL LOW (ref 212.0–360.0)

## 2022-03-27 LAB — COMPREHENSIVE METABOLIC PANEL
ALT: 11 U/L (ref 0–53)
AST: 12 U/L (ref 0–37)
Albumin: 3.1 g/dL — ABNORMAL LOW (ref 3.5–5.2)
Alkaline Phosphatase: 62 U/L (ref 39–117)
BUN: 32 mg/dL — ABNORMAL HIGH (ref 6–23)
CO2: 31 mEq/L (ref 19–32)
Calcium: 10.5 mg/dL (ref 8.4–10.5)
Chloride: 103 mEq/L (ref 96–112)
Creatinine, Ser: 1.33 mg/dL (ref 0.40–1.50)
GFR: 48.2 mL/min — ABNORMAL LOW (ref >60.00)
Glucose, Bld: 101 mg/dL — ABNORMAL HIGH (ref 70–99)
Potassium: 4.3 mEq/L (ref 3.5–5.1)
Sodium: 139 mEq/L (ref 135–145)
Total Bilirubin: 0.6 mg/dL (ref 0.2–1.2)
Total Protein: 6.3 g/dL (ref 6.0–8.3)

## 2022-03-27 LAB — POCT INR: INR: 2 (ref 2.0–3.0)

## 2022-03-27 NOTE — Addendum Note (Signed)
Addended by: Wellington Hampshire on: 03/27/2022 07:53 PM   Modules accepted: Orders

## 2022-03-27 NOTE — Progress Notes (Signed)
Subjective:     Patient ID: Daniel Sharp, male    DOB: 1933/06/19, 88 y.o.   MRN: [id number]  Chief Complaint  Patient presents with   Follow-up    Follow-up from rehab, discharged a week ago, discuss medications and possible changes     HPI-here w/son Daniel Sharp  Admitted to hosp 02/13/22 for "weakness" and dbl vision.  Pt had collapsed to floor and son from MI couldn't pick him up from floor so took to ER.  Admitted. Sent to SNF. He also had bradycardia so atenolol held.  AKI/CKD-so benazepril held.  Labs stable.  Pt states they "didn't do much".  Had rehab at [facility].  2.  HTN-restarted amlodipine '5mg'$ .  Also on Lasix '20mg'$ .  Pt confused why meds changed-explained above to him  3.  MG-on mestinon and prednisone-saw neuro-cont same doses. Marland Kitchen 4.  afib-was on atenolol for rate but held d/t bradycardia.  On coumadin as valve repair. as well. EF 55% per echo from hosp-valve regurg and dilated atria.   Cxr cardiomegaly w/mild vascular congestion  Hgb 9's.  Wbc >11(on pred '10mg'$  daily)  D/c from rehab 1 wk ago.   Son thinks he was missing meds prior to hosp.  Using walker now.  Getting home care nurse-got home PT last wk.  Getting today.    Son working on Production manager.   5.  RCC L-Saw urol 2 wks ago-doing well. S/p L nephrectomy.  No abd pain.   There are no preventive care reminders to display for this patient.   Past Medical History:  Diagnosis Date   A-fib [facility])    history of   Arthritis    Dyspnea    H/O mitral valve repair 07/2003   Hypertension    Ocular myasthenia gravis ([place])    takes pred   Pneumonia     Past Surgical History:  Procedure Laterality Date   APPENDECTOMY     BACK SURGERY     cyst removed L3 and L4   CHOLECYSTECTOMY     FINGER SURGERY Right    R hand   MITRAL VALVE REPAIR     on coumadin   NOSE SURGERY     ROBOTIC ASSITED PARTIAL NEPHRECTOMY Left 01/22/2022   Procedure: XI ROBOTIC ASSITED NEPHRECTOMY;  Surgeon: Ceasar Mons, MD;  Location: [facility]  [facility];  Service: Urology;  Laterality: Left;    Outpatient Medications Prior to Visit  Medication Sig Dispense Refill   amLODipine (NORVASC) 5 MG tablet Take 1 tablet (5 mg total) by mouth daily.     Cholecalciferol (VITAMIN D3 PO) Take 1 tablet by mouth daily.     docusate sodium (COLACE) 100 MG capsule Take 1 capsule (100 mg total) by mouth 2 (two) times daily.     furosemide (LASIX) 20 MG tablet Take 20 mg by mouth daily.     glucosamine-chondroitin 500-400 MG tablet Take 1 tablet by mouth daily.     HYDROcodone-acetaminophen (NORCO/VICODIN) 5-325 MG tablet Take 1-2 tablets by mouth every 6 (six) hours as needed for moderate pain or severe pain. 15 tablet 0   predniSONE (DELTASONE) 5 MG tablet Take 2 tablets (10 mg total) by mouth daily. (Patient taking differently: Take 10 mg by mouth daily. Taking 2 tabs by mouth daily) 180 tablet 1   pyridostigmine (MESTINON) 60 MG tablet Take 1 tablet (60 mg total) by mouth every 8 (eight) hours.     tamsulosin (FLOMAX) 0.4 MG CAPS capsule Take 0.4 mg by mouth daily.  warfarin (COUMADIN) 5 MG tablet Take 2.5-5 mg by mouth as directed. Take 1 tablet (5 mg) every Mon. Weds.fri  sat sun     promethazine (PHENERGAN) 12.5 MG tablet Take 1 tablet (12.5 mg total) by mouth every 4 (four) hours as needed for nausea or vomiting. (Patient not taking: Reported on 03/27/2022) 15 tablet 0   docusate sodium (COLACE) 100 MG capsule Take 100 mg by mouth at bedtime. (Patient not taking: Reported on 03/04/2022)     No facility-administered medications prior to visit.    No Known Allergies ROS neg/noncontributory except as noted HPI/below Has lost wt and some muscle mass.  No abdominal pain, dark tarry stools, chest pain, shortness of breath.  Edema is much better.  He is eating and drinking well. Still some fatigue, but much better since off atenolol.       Objective:     BP (!) 140/60   Pulse 100   Temp 98.2 F (36.8 C) (Temporal)   Ht '6\' 1"'$  (1.854 m)   Wt  195 lb 8 oz (88.7 kg)   SpO2 96%   BMI 25.79 kg/m  Wt Readings from Last 3 Encounters:  03/27/22 195 lb 8 oz (88.7 kg)  03/04/22 192 lb (87.1 kg)  02/13/22 190 lb 14.7 oz (86.6 kg)    Physical Exam   Gen: WDWN NAD pale, elderly WM HEENT: NCAT, conjunctiva not injected, sclera nonicteric NECK:  supple, no thyromegaly, no nodes, no carotid bruits CARDIAC: tachy irRRR, S1S2+,+ murmur. DP 1+B LUNGS: CTAB. No wheezes ABDOMEN:  BS+, soft, NTND, No HSM, no masses EXT:  chronic edema-but much better MSK: walker.  NEURO: A&O x3.  CN II-XII intact.  PSYCH: normal mood. Good eye contact  Total time in chart review, answering pt questions, plan, etc-20mn    Assessment & Plan:   Problem List Items Addressed This Visit       Cardiovascular and Mediastinum   Hypertension - Primary   Relevant Orders   CBC with Differential/Platelet (Completed)   Comprehensive metabolic panel (Completed)   Persistent atrial fibrillation (HCC)     Nervous and Auditory   Myasthenia gravis without acute exacerbation ([place]     Genitourinary   Renal cell carcinoma of left kidney (HCC)     Other   Localized edema   Relevant Orders   CBC with Differential/Platelet (Completed)   Comprehensive metabolic panel (Completed)   Generalized weakness   Other Visit Diagnoses     Need for immunization against influenza       Relevant Orders   Flu Vaccine QUAD High Dose(Fluad) (Completed)   Iron deficiency anemia due to chronic blood loss       Relevant Orders   IBC + Ferritin (Completed)   Vitamin B12   Hemoccult Cards (X3 cards)     1.  Hypertension-chronic.  Controlled.  Continue amlodipine 5 mg daily and Lasix 20 mg daily.  Monitor blood pressures and weights.  Check BMP, CBC.  Explained that atenolol was stopped due to bradycardia, benazepril stopped due to increased renal function. 2.  Chronic edema-improved.  Wearing compression stockings.  Elevate legs when sitting.  He has leg squeezers as well.   Continue Lasix 20 mg.  Check BMP 3.  Atrial fibrillation-chronic.  Controlled.  However, rate has increased since off atenolol.  Advised to monitor her heart rate.  If consistently above 100, we may need to start very low-dose of beta-blocker.  He is on anticoagulation with Coumadin chronically.  He will  be seen today in the Coumadin clinic. 4.  Myasthenia gravis-chronic.  Controlled on prednisone and Mestinon.  Managed by neurology. 5.  Left renal cell carcinoma-status post left nephrectomy.  Is doing well.  Follows with urology. 6.  Weakness-multifactorial.  He had bradycardia from atenolol.  Possibly some mild dehydration, possibly a little exacerbation of myasthenia gravis.  He has been discharged from skilled nursing facility.  He does have home care and home PT.  Check CBC, CMP  Follow-up 3 months.  Addendum: Labs came back with hemoglobin of 7.7.  Patient was advised to go the emergency room.  He declined.  We referred to hematology for possible transfusion, however they want him stabilized first.  Unclear exactly why his hemoglobin has dropped.  He has no dark stools, bleeding anywhere.  He actually feels pretty good.  I have added iron studies and B12 to his labs.  Also will do stool cards to see if positive for blood.  If positive will need to see GI as well.  Advised the son and patient if he has any dark stools, blood from his rectum, he needs to go straight to the emergency room.  (Talked to them at about 5:15 PM)  No orders of the defined types were placed in this encounter.   Wellington Hampshire, MD

## 2022-03-27 NOTE — Progress Notes (Addendum)
Pt has recently been d/c from SNF for rehab. Pt's son reports the facility nurse and physician changed his INR range due to him have a heart valve replacement. They changed the range to 2.5-3.5. Pt reports he thinks they were confused because he did not have a heart valve replacement, he had a heart valve repair in 2005. Before pt moved to [place] with his son, his INR was managed in [place] and his INR range was 2.0-3.0, with a diagnosis of Afib and heart valve repair. The [facility] recommendation for pt with these diagnoses is 2.0-3.0. Continue 1 tablet daily except take 1/2 tablet on Tuesdays and Fridays. Recheck in 3 week.

## 2022-03-27 NOTE — Telephone Encounter (Signed)
CRITICAL VALUE STICKER  CRITICAL VALUE:  HGB 7.7 HCT 23.0   RECEIVER (on-site recipient of call): Hildred Alamin, RN  DATE & TIME NOTIFIED: 03/27/22 at 11:16  MESSENGER (representative from lab): Delorise Jackson  MD NOTIFIED: Esther Hardy, MD  TIME OF NOTIFICATION: 11:17

## 2022-03-27 NOTE — Patient Instructions (Signed)
Watch pulse from the oximeter.  If continues to be near over 100, let me know

## 2022-03-27 NOTE — Telephone Encounter (Signed)
Spoke with patient and son, gave message advising to go to ER due to critical numbers per pcp. Patient and son declined going to ER, asked for alternative options. Gave the option of going to see hematology for further evaluation per PCP. Both agreed to go to hematology, referral placed as urgent, sent referral coordinator message about urgent referral.

## 2022-03-27 NOTE — Patient Instructions (Signed)
  Continue 1 tablet daily except take 1/2 tablet on Tuesdays and Fridays. Recheck in 3 week.

## 2022-03-27 NOTE — Telephone Encounter (Signed)
Son notified and verbalized understanding. Will come pick up stool cards. He also stated that a nurse came out to visit after they left here earlier and suggested patient try to get scheduled at [facility], will discuss more when PCP call later.

## 2022-03-28 ENCOUNTER — Other Ambulatory Visit: Payer: Self-pay

## 2022-03-29 ENCOUNTER — Other Ambulatory Visit: Payer: Self-pay | Admitting: *Deleted

## 2022-03-29 ENCOUNTER — Other Ambulatory Visit (HOSPITAL_COMMUNITY): Payer: Self-pay

## 2022-03-29 ENCOUNTER — Other Ambulatory Visit (INDEPENDENT_AMBULATORY_CARE_PROVIDER_SITE_OTHER): Payer: Medicare HMO

## 2022-03-29 ENCOUNTER — Telehealth: Payer: Self-pay | Admitting: Family Medicine

## 2022-03-29 DIAGNOSIS — K921 Melena: Secondary | ICD-10-CM

## 2022-03-29 DIAGNOSIS — R531 Weakness: Secondary | ICD-10-CM

## 2022-03-29 DIAGNOSIS — N912 Amenorrhea, unspecified: Secondary | ICD-10-CM

## 2022-03-29 DIAGNOSIS — D500 Iron deficiency anemia secondary to blood loss (chronic): Secondary | ICD-10-CM | POA: Diagnosis not present

## 2022-03-29 LAB — FECAL OCCULT BLOOD, IMMUNOCHEMICAL: Fecal Occult Bld: POSITIVE — AB

## 2022-03-29 MED ORDER — PANTOPRAZOLE SODIUM 40 MG PO TBEC: 40.0000 mg | DELAYED_RELEASE_TABLET | Freq: Every day | ORAL | 0 refills | Status: DC

## 2022-03-29 NOTE — Telephone Encounter (Signed)
Positive IFOB  Recived form [facility] lab at 2:45

## 2022-04-01 ENCOUNTER — Telehealth: Payer: Self-pay | Admitting: Gastroenterology

## 2022-04-01 ENCOUNTER — Other Ambulatory Visit: Payer: Medicare HMO

## 2022-04-01 NOTE — Telephone Encounter (Signed)
Urgent referral received from PCP for severe anemia and blood in stool.  Patient is currently having a blood transfusion and requesting patient be seen ASAP.  His son stated he has GI hx in [place] 4-5 years ago but recently diagnosed with Renal cancer.  Please advise scheduling?  Dr. Bryan Lemma supervising MD 9/22 PM.

## 2022-04-01 NOTE — Telephone Encounter (Signed)
Pt scheduled with Ellouise Newer, PA on Thursday 04/04/22 at 8:30 am. Pt's son verbalized understanding and had no other concerns at end of call.

## 2022-04-02 ENCOUNTER — Ambulatory Visit (HOSPITAL_COMMUNITY)
Admission: RE | Admit: 2022-04-02 | Discharge: 2022-04-02 | Disposition: A | Discharge status: Home / Self Care | Payer: Medicare HMO | Source: Ambulatory Visit | Attending: Family Medicine | Admitting: Family Medicine

## 2022-04-02 DIAGNOSIS — R531 Weakness: Secondary | ICD-10-CM | POA: Diagnosis not present

## 2022-04-02 LAB — PREPARE RBC (CROSSMATCH)

## 2022-04-03 ENCOUNTER — Inpatient Hospital Stay: Payer: Medicare HMO | Attending: Hematology & Oncology

## 2022-04-03 ENCOUNTER — Encounter: Payer: Self-pay | Admitting: Family

## 2022-04-03 ENCOUNTER — Other Ambulatory Visit: Payer: Self-pay | Admitting: Family

## 2022-04-03 ENCOUNTER — Inpatient Hospital Stay: Payer: Medicare HMO

## 2022-04-03 ENCOUNTER — Inpatient Hospital Stay (HOSPITAL_BASED_OUTPATIENT_CLINIC_OR_DEPARTMENT_OTHER): Payer: Medicare HMO | Admitting: Family

## 2022-04-03 VITALS — BP 129/61 | HR 91 | Temp 98.2°F | Resp 18 | Wt 194.1 lb

## 2022-04-03 VITALS — BP 135/60 | HR 89 | Resp 18

## 2022-04-03 DIAGNOSIS — D500 Iron deficiency anemia secondary to blood loss (chronic): Secondary | ICD-10-CM | POA: Insufficient documentation

## 2022-04-03 DIAGNOSIS — Z87891 Personal history of nicotine dependence: Secondary | ICD-10-CM

## 2022-04-03 DIAGNOSIS — Z7901 Long term (current) use of anticoagulants: Secondary | ICD-10-CM | POA: Insufficient documentation

## 2022-04-03 DIAGNOSIS — K922 Gastrointestinal hemorrhage, unspecified: Secondary | ICD-10-CM | POA: Diagnosis not present

## 2022-04-03 DIAGNOSIS — Z905 Acquired absence of kidney: Secondary | ICD-10-CM | POA: Diagnosis not present

## 2022-04-03 DIAGNOSIS — Z85528 Personal history of other malignant neoplasm of kidney: Secondary | ICD-10-CM | POA: Diagnosis not present

## 2022-04-03 DIAGNOSIS — D649 Anemia, unspecified: Secondary | ICD-10-CM

## 2022-04-03 DIAGNOSIS — I4891 Unspecified atrial fibrillation: Secondary | ICD-10-CM | POA: Diagnosis not present

## 2022-04-03 DIAGNOSIS — Z79899 Other long term (current) drug therapy: Secondary | ICD-10-CM | POA: Diagnosis not present

## 2022-04-03 DIAGNOSIS — D509 Iron deficiency anemia, unspecified: Secondary | ICD-10-CM | POA: Insufficient documentation

## 2022-04-03 LAB — RETICULOCYTES
Immature Retic Fract: 14.4 % (ref 2.3–15.9)
RBC.: 2.82 MIL/uL — ABNORMAL LOW (ref 4.22–5.81)
Retic Count, Absolute: 99.8 10*3/uL (ref 19.0–186.0)
Retic Ct Pct: 3.5 % — ABNORMAL HIGH (ref 0.4–3.1)

## 2022-04-03 LAB — TYPE AND SCREEN
ABO/RH(D): A POS
Antibody Screen: NEGATIVE
Unit division: 0

## 2022-04-03 LAB — CBC WITH DIFFERENTIAL (CANCER CENTER ONLY)
Abs Immature Granulocytes: 0.14 10*3/uL — ABNORMAL HIGH (ref 0.00–0.07)
Basophils Absolute: 0 10*3/uL (ref 0.0–0.1)
Basophils Relative: 0 %
Eosinophils Absolute: 0 10*3/uL (ref 0.0–0.5)
Eosinophils Relative: 0 %
HCT: 26.8 % — ABNORMAL LOW (ref 39.0–52.0)
Hemoglobin: 8.1 g/dL — ABNORMAL LOW (ref 13.0–17.0)
Immature Granulocytes: 2 %
Lymphocytes Relative: 6 %
Lymphs Abs: 0.5 10*3/uL — ABNORMAL LOW (ref 0.7–4.0)
MCH: 28.4 pg (ref 26.0–34.0)
MCHC: 30.2 g/dL (ref 30.0–36.0)
MCV: 94 fL (ref 80.0–100.0)
Monocytes Absolute: 0.6 10*3/uL (ref 0.1–1.0)
Monocytes Relative: 6 %
Neutro Abs: 7.9 10*3/uL — ABNORMAL HIGH (ref 1.7–7.7)
Neutrophils Relative %: 86 %
Platelet Count: 234 10*3/uL (ref 150–400)
RBC: 2.85 MIL/uL — ABNORMAL LOW (ref 4.22–5.81)
RDW: 15.1 % (ref 11.5–15.5)
WBC Count: 9.1 10*3/uL (ref 4.0–10.5)
nRBC: 0 % (ref 0.0–0.2)

## 2022-04-03 LAB — SAMPLE TO BLOOD BANK

## 2022-04-03 LAB — BPAM RBC
Blood Product Expiration Date: 202310202359
ISSUE DATE / TIME: 202309260916
Unit Type and Rh: 6200

## 2022-04-03 LAB — LACTATE DEHYDROGENASE: LDH: 148 U/L (ref 98–192)

## 2022-04-03 MED ORDER — SODIUM CHLORIDE 0.9 % IV SOLN
300.0000 mg | Freq: Once | INTRAVENOUS | Status: AC
  Administered 2022-04-03: 300 mg via INTRAVENOUS
  Filled 2022-04-03: qty 300

## 2022-04-03 MED ORDER — SODIUM CHLORIDE 0.9 % IV SOLN: Freq: Once | INTRAVENOUS | Status: AC

## 2022-04-03 NOTE — Patient Instructions (Signed)

## 2022-04-03 NOTE — Progress Notes (Signed)
Hematology/Oncology Consultation   Name: Daniel Sharp      MRN: [id number]    Location: Room/bed info not found  Date: 04/03/2022 Time:11:36 AM   REFERRING PHYSICIAN: [name], MD  REASON FOR CONSULT: Abnormal Hgb   DIAGNOSIS: Iron deficiency anemia secondary to GI blood loss  HISTORY OF PRESENT ILLNESS: [name] is a very pleasant 88 yo caucasian gentleman with recent anemia secondary to GI blood loss.  Stool positive for blood on 03/29/2022. No other blood loss noted.  No abnormal bruising, no petechaie.  His stool continue's to be formed but black in color.  He is on coumadin for atrial fib.  Iron saturation is 12% and ferritin 135. Hgb 8.1, MCV 94, platelets 234 and WBC count 9.1.  He received 1 unit of blood yesterday with his PCP.  He has an appointment with GI and nephrology tomorrow.  He is symptomatic with fatigue and weakness. He is in a wheelchair today.  He states that he thinks his mother had anemia in her 88's.  He fell in December 2022 and during his work up he was found to have a tumor on the left kidney. He was on observation for several months and then decided to have it removed. He had a left nephrectomy in July 2023. This was clear cell renal cell carcinoma. He did not require chemo or radiation.  He had an uncle with lung cancer (smoker).  No history of diabetes or thyroid disease.  No fever, chills, n/v, cough, rash, dizziness, SOB, chest pain, palpitations, abdominal pain or changes in bladder habits.  He has mild puffiness in his feet and ankles. No pitting edema noted. Pedal pulses are 1+. He is wearing compression stockings.  No numbness or tingling in his extremities.  No falls or syncope reported.  He used to smoke a pipe but now will chew nicotine gum if needed.  He stopped drinking scotch and beer in December after he fell.  Appetite comes and goes. He admits that he needs to stay better hydrated throughout the day as well. His weight is stable at 194  lbs.  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   ROS: All other 10 point review of systems is negative.   PAST MEDICAL HISTORY:   Past Medical History:  Diagnosis Date   A-fib [facility])    history of   Arthritis    Dyspnea    H/O mitral valve repair 07/2003   Hypertension    Ocular myasthenia gravis ([place])    takes pred   Pneumonia     ALLERGIES: No Known Allergies    MEDICATIONS:  Current Outpatient Medications on File Prior to Visit  Medication Sig Dispense Refill   amLODipine (NORVASC) 5 MG tablet Take 1 tablet (5 mg total) by mouth daily.     Cholecalciferol (VITAMIN D3 PO) Take 1 tablet by mouth daily.     docusate sodium (COLACE) 100 MG capsule Take 1 capsule (100 mg total) by mouth 2 (two) times daily.     furosemide (LASIX) 20 MG tablet Take 20 mg by mouth daily.     glucosamine-chondroitin 500-400 MG tablet Take 1 tablet by mouth daily.     HYDROcodone-acetaminophen (NORCO/VICODIN) 5-325 MG tablet Take 1-2 tablets by mouth every 6 (six) hours as needed for moderate pain or severe pain. 15 tablet 0   pantoprazole (PROTONIX) 40 MG tablet Take 1 tablet (40 mg total)  by mouth daily. 30 tablet 0   predniSONE (DELTASONE) 5 MG tablet Take 2 tablets (10 mg total) by mouth daily. (Patient taking differently: Take 10 mg by mouth daily. Taking 2 tabs by mouth daily) 180 tablet 1   promethazine (PHENERGAN) 12.5 MG tablet Take 1 tablet (12.5 mg total) by mouth every 4 (four) hours as needed for nausea or vomiting. 15 tablet 0   pyridostigmine (MESTINON) 60 MG tablet Take 1 tablet (60 mg total) by mouth every 8 (eight) hours.     tamsulosin (FLOMAX) 0.4 MG CAPS capsule Take 0.4 mg by mouth daily.     warfarin (COUMADIN) 5 MG tablet Take 2.5-5 mg by mouth as directed. Take 1 tablet (5 mg) every Mon. Weds.fri  sat sun     No current facility-administered medications on file prior to visit.     PAST SURGICAL  HISTORY Past Surgical History:  Procedure Laterality Date   APPENDECTOMY     BACK SURGERY     cyst removed L3 and L4   CHOLECYSTECTOMY     FINGER SURGERY Right    R hand   MITRAL VALVE REPAIR     on coumadin   NOSE SURGERY     ROBOTIC ASSITED PARTIAL NEPHRECTOMY Left 01/22/2022   Procedure: XI ROBOTIC ASSITED NEPHRECTOMY;  Surgeon: Ceasar Mons, MD;  Location: [facility];  Service: Urology;  Laterality: Left;    FAMILY HISTORY: Family History  Problem Relation Age of Onset   Other Mother        Gallbladder removed   Stroke Father        intracranial stroke   Stroke Brother     SOCIAL HISTORY:  reports that he has quit smoking. His smoking use included cigarettes, pipe, and cigars. He has quit using smokeless tobacco.  His smokeless tobacco use included chew. He reports current alcohol use of about 7.0 standard drinks of alcohol per week. He reports that he does not use drugs.  PERFORMANCE STATUS: The patient's performance status is 2 - Symptomatic, <50% confined to bed  PHYSICAL EXAM: Most Recent Vital Signs: Blood pressure 129/61, pulse 91, temperature 98.2 F (36.8 C), temperature source Oral, resp. rate 18, weight 194 lb 1.3 oz (88 kg), SpO2 99 %. BP 129/61 (BP Location: Left Arm, Patient Position: Sitting)   Pulse 91   Temp 98.2 F (36.8 C) (Oral)   Resp 18   Wt 194 lb 1.3 oz (88 kg)   SpO2 99%   BMI 25.61 kg/m   General Appearance:    Alert, cooperative, no distress, appears stated age  Head:    Normocephalic, without obvious abnormality, atraumatic  Eyes:    PERRL, conjunctiva/corneas clear, EOM's intact, fundi    benign, both eyes             Throat:   Lips, mucosa, and tongue normal; teeth and gums normal  Neck:   Supple, symmetrical, trachea midline, no adenopathy;       thyroid:  No enlargement/tenderness/nodules; no carotid   bruit or JVD  Back:     Symmetric, no curvature, ROM normal, no CVA tenderness  Lungs:     Clear to auscultation  bilaterally, respirations unlabored  Chest wall:    No tenderness or deformity  Heart:    Regular rate and rhythm, S1 and S2 normal, no murmur, rub   or gallop  Abdomen:     Soft, non-tender, bowel sounds active all four quadrants,    no masses, no organomegaly  Extremities:   Extremities normal, atraumatic, no cyanosis or edema  Pulses:   2+ and symmetric all extremities  Skin:   Skin color, texture, turgor normal, no rashes or lesions  Lymph nodes:   Cervical, supraclavicular, and axillary nodes normal  Neurologic:   CNII-XII intact. Normal strength, sensation and reflexes      throughout    LABORATORY DATA:  Results for orders placed or performed in visit on 04/03/22 (from the past 48 hour(s))  CBC with Differential ([place] Only)     Status: Abnormal   Collection Time: 04/03/22 10:40 AM  Result Value Ref Range   WBC Count 9.1 4.0 - 10.5 K/uL   RBC 2.85 (L) 4.22 - 5.81 MIL/uL   Hemoglobin 8.1 (L) 13.0 - 17.0 g/dL   HCT 26.8 (L) 39.0 - 52.0 %   MCV 94.0 80.0 - 100.0 fL   MCH 28.4 26.0 - 34.0 pg   MCHC 30.2 30.0 - 36.0 g/dL   RDW 15.1 11.5 - 15.5 %   Platelet Count 234 150 - 400 K/uL   nRBC 0.0 0.0 - 0.2 %   Neutrophils Relative % 86 %   Neutro Abs 7.9 (H) 1.7 - 7.7 K/uL   Lymphocytes Relative 6 %   Lymphs Abs 0.5 (L) 0.7 - 4.0 K/uL   Monocytes Relative 6 %   Monocytes Absolute 0.6 0.1 - 1.0 K/uL   Eosinophils Relative 0 %   Eosinophils Absolute 0.0 0.0 - 0.5 K/uL   Basophils Relative 0 %   Basophils Absolute 0.0 0.0 - 0.1 K/uL   Immature Granulocytes 2 %   Abs Immature Granulocytes 0.14 (H) 0.00 - 0.07 K/uL    Comment: Performed at [facility] at [facility], [address], [place], [place] [address]  Reticulocytes     Status: Abnormal   Collection Time: 04/03/22 10:40 AM  Result Value Ref Range   Retic Ct Pct 3.5 (H) 0.4 - 3.1 %   RBC. 2.82 (L) 4.22 - 5.81 MIL/uL   Retic Count, Absolute 99.8 19.0 - 186.0 K/uL   Immature  Retic Fract 14.4 2.3 - 15.9 %    Comment: Performed at [facility] at [facility], [address] , [place] [address]      RADIOGRAPHY: No results found.     PATHOLOGY: None  ASSESSMENT/PLAN: Mr. Malinak is a very pleasant 88 yo caucasian gentleman with recent anemia secondary to GI blood loss.  We will get him set up for 3 doses of Venofer starting today.  Epo level is pending. He may also benefit from an ESA.  Follow-up in 4 weeks.   All questions were answered. The patient knows to call the clinic with any problems, questions or concerns. We can certainly see the patient much sooner if necessary.  The patient was discussed with Dr. Marin Olp and he is in agreement with the aforementioned.   Lottie Dawson, NP

## 2022-04-04 ENCOUNTER — Encounter: Payer: Self-pay | Admitting: Physician Assistant

## 2022-04-04 ENCOUNTER — Ambulatory Visit (INDEPENDENT_AMBULATORY_CARE_PROVIDER_SITE_OTHER): Payer: Medicare HMO | Admitting: Physician Assistant

## 2022-04-04 VITALS — BP 118/62 | HR 100 | Ht 73.0 in | Wt 196.5 lb

## 2022-04-04 DIAGNOSIS — D500 Iron deficiency anemia secondary to blood loss (chronic): Secondary | ICD-10-CM

## 2022-04-04 DIAGNOSIS — Z7901 Long term (current) use of anticoagulants: Secondary | ICD-10-CM

## 2022-04-04 DIAGNOSIS — K921 Melena: Secondary | ICD-10-CM | POA: Diagnosis not present

## 2022-04-04 DIAGNOSIS — R195 Other fecal abnormalities: Secondary | ICD-10-CM

## 2022-04-04 LAB — ERYTHROPOIETIN: Erythropoietin: 25.2 m[IU]/mL — ABNORMAL HIGH (ref 2.6–18.5)

## 2022-04-04 MED ORDER — NA SULFATE-K SULFATE-MG SULF 17.5-3.13-1.6 GM/177ML PO SOLN: 1.0000 | Freq: Once | ORAL | 0 refills | Status: AC

## 2022-04-04 NOTE — Patient Instructions (Addendum)
Your provider has requested that you go to the basement level for lab work on 04/15/22. Press "B" on the elevator. The lab is located at the first door on the left as you exit the elevator.  You have been scheduled for a colonoscopy. Please follow written instructions given to you at your visit today.  Please pick up your prep supplies at the pharmacy within the next 1-3 days. If you use inhalers (even only as needed), please bring them with you on the day of your procedure.  _______________________________________________________  If you are age 64 or older, your body mass index should be between 23-30. Your Body mass index is 25.93 kg/m. If this is out of the aforementioned range listed, please consider follow up with your Primary Care Provider.  If you are age 77 or younger, your body mass index should be between 19-25. Your Body mass index is 25.93 kg/m. If this is out of the aformentioned range listed, please consider follow up with your Primary Care Provider.   ________________________________________________________  The [facility] providers would like to encourage you to use [facility] to communicate with providers for non-urgent requests or questions.  Due to long hold times on the telephone, sending your provider a message by [facility] may be a faster and more efficient way to get a response.  Please allow 48 business hours for a response.  Please remember that this is for non-urgent requests.  _______________________________________________________

## 2022-04-04 NOTE — Progress Notes (Signed)
Chief Complaint: Symptomatic iron deficiency anemia   HPI:    Daniel Sharp is an 88 year old male with a past medical history as listed below including A-fib on Warfarin (02/14/2022 echo with LVEF 55%) and recent diagnosis of renal cell cancer, who was referred to me by [name], MD for a complaint of symptomatic iron deficiency anemia.      04/03/2022 patient seen by oncology for recent anemia secondary to GI blood loss.  Apparently had an Hemoccult positive stool 03/29/2022.  Described his stool being black in color.  Iron saturation 12%, ferritin 135, hemoglobin 8.1, MCV 94, platelets 234 and WBC 9.1.  He received 1 unit of PRBCs on 04/02/2022.  Does describe fatigue and weakness.  Described history of falling in December 2022 and during his work-up found to have a tumor on the left kidney.  He had a left nephrectomy in July 2023 with pathology showing renal cell carcinoma.  He did not require chemo or radiation.  That time he was set up for 3 doses of Venofer.    Today, the patient presents to clinic accompanied by his son.  They explain that he had a nephrectomy in July and returned to the hospital shortly after that because he felt fatigued and had low counts then (looks like hemoglobin 9.9 on 02/14/2022) but they tell me that they "did not do anything about it".  He then recently had his labs rechecked on 03/27/2022 and was found to have a hemoglobin of 7.7.  He was given a unit of blood and most recent check was 8.1.  Patient felt well for a few days after the transfusion but thinks he may feel little bit more tired now.  He is currently in a wheelchair but they tell me he is able to ambulate short distances.  Describes seeing a dark black stool over the past 2 weeks.  Currently on Coumadin.  No prior endoscopic work-up.    Denies fever, chills, weight loss, abdominal pain, heartburn, reflux, nausea or vomiting.     Past Medical History:  Diagnosis Date   A-fib [facility])    history of   Arthritis     Dyspnea    H/O mitral valve repair 07/2003   Hypertension    Ocular myasthenia gravis ([place])    takes pred   Pneumonia     Past Surgical History:  Procedure Laterality Date   APPENDECTOMY     BACK SURGERY     cyst removed L3 and L4   CHOLECYSTECTOMY     FINGER SURGERY Right    R hand   MITRAL VALVE REPAIR     on coumadin   NOSE SURGERY     ROBOTIC ASSITED PARTIAL NEPHRECTOMY Left 01/22/2022   Procedure: XI ROBOTIC ASSITED NEPHRECTOMY;  Surgeon: Ceasar Mons, MD;  Location: [facility];  Service: Urology;  Laterality: Left;    Current Outpatient Medications  Medication Sig Dispense Refill   amLODipine (NORVASC) 5 MG tablet Take 1 tablet (5 mg total) by mouth daily.     Cholecalciferol (VITAMIN D3 PO) Take 1 tablet by mouth daily.     docusate sodium (COLACE) 100 MG capsule Take 1 capsule (100 mg total) by mouth 2 (two) times daily.     furosemide (LASIX) 20 MG tablet Take 20 mg by mouth daily.     glucosamine-chondroitin 500-400 MG tablet Take 1 tablet by mouth daily.     HYDROcodone-acetaminophen (NORCO/VICODIN) 5-325 MG tablet Take 1-2 tablets by mouth every 6 (  six) hours as needed for moderate pain or severe pain. 15 tablet 0   pantoprazole (PROTONIX) 40 MG tablet Take 1 tablet (40 mg total) by mouth daily. 30 tablet 0   predniSONE (DELTASONE) 5 MG tablet Take 2 tablets (10 mg total) by mouth daily. (Patient taking differently: Take 10 mg by mouth daily. Taking 2 tabs by mouth daily) 180 tablet 1   promethazine (PHENERGAN) 12.5 MG tablet Take 1 tablet (12.5 mg total) by mouth every 4 (four) hours as needed for nausea or vomiting. 15 tablet 0   pyridostigmine (MESTINON) 60 MG tablet Take 1 tablet (60 mg total) by mouth every 8 (eight) hours.     tamsulosin (FLOMAX) 0.4 MG CAPS capsule Take 0.4 mg by mouth daily.     warfarin (COUMADIN) 5 MG tablet Take 2.5-5 mg by mouth as directed. Take 1 tablet (5 mg) every Mon. Weds.fri  sat sun     No current facility-administered  medications for this visit.    Allergies as of 04/04/2022   (No Known Allergies)    Family History  Problem Relation Age of Onset   Other Mother        Gallbladder removed   Stroke Father        intracranial stroke   Stroke Brother     Social History   Socioeconomic History   Marital status: Widowed    Spouse name: Not on file   Number of children: 2   Years of education: Not on file   Highest education level: Not on file  Occupational History   Not on file  Tobacco Use   Smoking status: Former    Types: Cigarettes, Pipe, Cigars   Smokeless tobacco: Former    Types: Chew   Tobacco comments:    Uses nicotine gum   Vaping Use   Vaping Use: Never used  Substance and Sexual Activity   Alcohol use: Yes    Alcohol/week: 7.0 standard drinks of alcohol    Types: 7 Glasses of wine per week    Comment: Drinks wine when sons will let him   Drug use: Never   Sexual activity: Not on file  Other Topics Concern   Not on file  Social History Narrative   Lives in [place], [place].  Son lives in [place]      Right [place]    Lives in a one story home with a basement.    Lives at [name] at this time   Social Determinants of Health   Financial Resource Strain: Not on file  Food Insecurity: Not on file  Transportation Needs: Not on file  Physical Activity: Not on file  Stress: Not on file  Social Connections: Not on file  Intimate Partner Violence: Not on file    Review of Systems:    Constitutional: No weight loss, fever or chills Skin: No rash  Cardiovascular: No chest pain Respiratory: No SOB  Gastrointestinal: See HPI and otherwise negative Genitourinary: No dysuria  Neurological: No dizziness or syncope Musculoskeletal: No new muscle or joint pain Hematologic: No  bruising Psychiatric: No history of depression or anxiety   Physical Exam:  Vital signs: BP 118/62   Pulse 100   Ht '6\' 1"'$  (1.854 m)   Wt 196 lb 8 oz (89.1 kg)   SpO2 95%   BMI 25.93 kg/m     Constitutional:   Pleasant ill appearing Caucasian male appears to be in NAD, Well developed, Well nourished, alert and cooperative Head:  Normocephalic  and atraumatic. Eyes:   PEERL, EOMI. No icterus. Conjunctiva pink. Ears:  Normal auditory acuity. Neck:  Supple Throat: Oral cavity and pharynx without inflammation, swelling or lesion.  Respiratory: Respirations even and unlabored. Lungs clear to auscultation bilaterally.   No wheezes, crackles, or rhonchi.  Cardiovascular: Normal S1, S2. No MRG. Regular rate and rhythm. No peripheral edema, cyanosis or pallor.  Gastrointestinal:  Soft, nondistended, nontender. No rebound or guarding. Normal bowel sounds. No appreciable masses or hepatomegaly. Rectal:  Not performed.  Msk:  Symmetrical without gross deformities. Without edema, no deformity or joint abnormality.  Neurologic:  Alert and  oriented x4;  grossly normal neurologically.  Skin:   Dry and intact without significant lesions or rashes. Psychiatric: Demonstrates good judgement and reason without abnormal affect or behaviors.  RELEVANT LABS AND IMAGING: CBC    Component Value Date/Time   WBC 9.1 04/03/2022 1040   WBC 9.1 03/27/2022 0854   RBC 2.85 (L) 04/03/2022 1040   RBC 2.82 (L) 04/03/2022 1040   HGB 8.1 (L) 04/03/2022 1040   HCT 26.8 (L) 04/03/2022 1040   PLT 234 04/03/2022 1040   MCV 94.0 04/03/2022 1040   MCH 28.4 04/03/2022 1040   MCHC 30.2 04/03/2022 1040   RDW 15.1 04/03/2022 1040   LYMPHSABS 0.5 (L) 04/03/2022 1040   MONOABS 0.6 04/03/2022 1040   EOSABS 0.0 04/03/2022 1040   BASOSABS 0.0 04/03/2022 1040    CMP     Component Value Date/Time   NA 139 03/27/2022 0854   K 4.3 03/27/2022 0854   CL 103 03/27/2022 0854   CO2 31 03/27/2022 0854   GLUCOSE 101 (H) 03/27/2022 0854   BUN 32 (H) 03/27/2022 0854   CREATININE 1.33 03/27/2022 0854   CALCIUM 10.5 03/27/2022 0854   PROT 6.3 03/27/2022 0854   ALBUMIN 3.1 (L) 03/27/2022 0854   AST 12 03/27/2022 0854    ALT 11 03/27/2022 0854   ALKPHOS 62 03/27/2022 0854   BILITOT 0.6 03/27/2022 0854   GFRNONAA 44 (L) 02/15/2022 0717    Assessment: 1.  Symptomatic iron deficiency anemia: Hemoglobin 7.7 on 03/27/2022 (9.2 on 02/18/2022), status post 1 unit PRBCs with a hemoglobin of 8.1, describes daily black stools over the past 2 weeks, currently on Coumadin, no prior endoscopic work-up; most likely upper GI source 2.  Renal cell cancer: Status post nephrectomy in July  Plan: 1.  Patient is already set up for iron infusions through hematology/oncology who he saw yesterday.  Due to the patient having to hold his Coumadin for procedure we will go ahead and schedule him for an EGD and colonoscopy so that we can get both done at the same time so he does not hold his blood thinner the future. 2.  We will communicate with his prescribing physician to ensure that holding his Coumadin for 5 days is appropriate for him. 3.  Patient was scheduled with Dr. Tarri Glenn in the [facility] on 04/18/2022.  He will have recheck of a CBC on 04/16/2022.  If hemoglobin is lower than 7 he will need to have a transfusion. 4.  Patient to follow with hematology per their recommendations.  Did discuss that if the patient has any increase in melena or additional symptoms he will need to go to the ER.  They really want to avoid going to the hospital if possible. 5.  Continue Pantoprazole 40 mg daily which was started at the end of last week. 6.  Patient follow in clinic per recommendations after time of  procedures.  Ellouise Newer, PA-C [facility] 04/04/2022, 8:38 AM  Cc: [name], MD

## 2022-04-05 ENCOUNTER — Encounter: Payer: Self-pay | Admitting: Family Medicine

## 2022-04-05 ENCOUNTER — Telehealth: Payer: Self-pay | Admitting: Family Medicine

## 2022-04-05 NOTE — Progress Notes (Signed)
Reviewed and agree with management plans. ? ?Lealer Marsland L. Anthony Tamburo, MD, MPH  ?

## 2022-04-05 NOTE — Telephone Encounter (Signed)
Caller states: -Patient recently had blood transfusion but blood count is still low  - He has an endoscopy coming up on 10/12 to find where bleeding is coming from  - He wanted to discuss patient's care in this regard further  - He will be sending a mychart message to explain further.   Caller requests: - A callback at [phone]

## 2022-04-05 NOTE — Telephone Encounter (Signed)
Please see message below

## 2022-04-08 ENCOUNTER — Telehealth: Payer: Self-pay | Admitting: *Deleted

## 2022-04-08 ENCOUNTER — Other Ambulatory Visit: Payer: Self-pay | Admitting: *Deleted

## 2022-04-08 ENCOUNTER — Other Ambulatory Visit (INDEPENDENT_AMBULATORY_CARE_PROVIDER_SITE_OTHER): Payer: Medicare HMO

## 2022-04-08 DIAGNOSIS — D582 Other hemoglobinopathies: Secondary | ICD-10-CM | POA: Diagnosis not present

## 2022-04-08 DIAGNOSIS — D649 Anemia, unspecified: Secondary | ICD-10-CM

## 2022-04-08 LAB — CBC WITH DIFFERENTIAL/PLATELET
Basophils Absolute: 0 10*3/uL (ref 0.0–0.1)
Basophils Relative: 0.5 % (ref 0.0–3.0)
Eosinophils Absolute: 0 10*3/uL (ref 0.0–0.7)
Eosinophils Relative: 0.5 % (ref 0.0–5.0)
HCT: 23.5 % — CL (ref 39.0–52.0)
Hemoglobin: 7.8 g/dL — CL (ref 13.0–17.0)
Lymphocytes Relative: 11.5 % — ABNORMAL LOW (ref 12.0–46.0)
Lymphs Abs: 1 10*3/uL (ref 0.7–4.0)
MCHC: 33.3 g/dL (ref 30.0–36.0)
MCV: 87.2 fl (ref 78.0–100.0)
Monocytes Absolute: 0.6 10*3/uL (ref 0.1–1.0)
Monocytes Relative: 6.9 % (ref 3.0–12.0)
Neutro Abs: 7.3 10*3/uL (ref 1.4–7.7)
Neutrophils Relative %: 80.6 % — ABNORMAL HIGH (ref 43.0–77.0)
Platelets: 238 10*3/uL (ref 150.0–400.0)
RBC: 2.7 Mil/uL — ABNORMAL LOW (ref 4.22–5.81)
RDW: 16.4 % — ABNORMAL HIGH (ref 11.5–15.5)
WBC: 9.1 10*3/uL (ref 4.0–10.5)

## 2022-04-08 NOTE — Telephone Encounter (Signed)
Holly from [place] lab called to give critical results: Hemaglobin: 7.8 and Hematocrit: 23.5

## 2022-04-08 NOTE — Telephone Encounter (Signed)
Per DPR, spoke to son, [name]. Dirk verbalized understanding.

## 2022-04-09 ENCOUNTER — Inpatient Hospital Stay: Payer: Medicare HMO

## 2022-04-09 ENCOUNTER — Other Ambulatory Visit: Payer: Self-pay

## 2022-04-09 ENCOUNTER — Inpatient Hospital Stay: Payer: Medicare HMO | Attending: Hematology & Oncology

## 2022-04-09 VITALS — BP 151/92 | HR 79 | Temp 98.3°F | Resp 16

## 2022-04-09 DIAGNOSIS — D500 Iron deficiency anemia secondary to blood loss (chronic): Secondary | ICD-10-CM | POA: Insufficient documentation

## 2022-04-09 DIAGNOSIS — D649 Anemia, unspecified: Secondary | ICD-10-CM

## 2022-04-09 DIAGNOSIS — K922 Gastrointestinal hemorrhage, unspecified: Secondary | ICD-10-CM | POA: Diagnosis present

## 2022-04-09 LAB — PREPARE RBC (CROSSMATCH)

## 2022-04-09 MED ORDER — SODIUM CHLORIDE 0.9% IV SOLUTION
250.0000 mL | Freq: Once | INTRAVENOUS | Status: AC
  Administered 2022-04-09: 250 mL via INTRAVENOUS

## 2022-04-09 MED ORDER — ACETAMINOPHEN 325 MG PO TABS
650.0000 mg | ORAL_TABLET | Freq: Once | ORAL | Status: AC
  Administered 2022-04-09: 650 mg via ORAL
  Filled 2022-04-09: qty 2

## 2022-04-09 MED ORDER — SODIUM CHLORIDE 0.9 % IV SOLN
300.0000 mg | Freq: Once | INTRAVENOUS | Status: AC
  Administered 2022-04-09: 300 mg via INTRAVENOUS
  Filled 2022-04-09: qty 300

## 2022-04-09 MED ORDER — DIPHENHYDRAMINE HCL 25 MG PO CAPS
25.0000 mg | ORAL_CAPSULE | Freq: Once | ORAL | Status: AC
  Administered 2022-04-09: 25 mg via ORAL
  Filled 2022-04-09: qty 1

## 2022-04-09 MED ORDER — SODIUM CHLORIDE 0.9 % IV SOLN: Freq: Once | INTRAVENOUS | Status: AC

## 2022-04-09 NOTE — Patient Instructions (Signed)
Iron Sucrose Injection What is this medication? IRON SUCROSE (EYE ern SOO krose) treats low levels of iron (iron deficiency anemia) in people with kidney disease. Iron is a mineral that plays an important role in making red blood cells, which carry oxygen from your lungs to the rest of your body. This medicine may be used for other purposes; ask your health care provider or pharmacist if you have questions. COMMON BRAND NAME(S): Venofer What should I tell my care team before I take this medication? They need to know if you have any of these conditions: Anemia not caused by low iron levels Heart disease High levels of iron in the blood Kidney disease Liver disease An unusual or allergic reaction to iron, other medications, foods, dyes, or preservatives Pregnant or trying to get pregnant Breast-feeding How should I use this medication? This medication is for infusion into a vein. It is given in a hospital or clinic setting. Talk to your care team about the use of this medication in children. While this medication may be prescribed for children as young as 2 years for selected conditions, precautions do apply. Overdosage: If you think you have taken too much of this medicine contact a poison control center or emergency room at once. NOTE: This medicine is only for you. Do not share this medicine with others. What if I miss a dose? It is important not to miss your dose. Call your care team if you are unable to keep an appointment. What may interact with this medication? Do not take this medication with any of the following: Deferoxamine Dimercaprol Other iron products This medication may also interact with the following: Chloramphenicol Deferasirox This list may not describe all possible interactions. Give your health care provider a list of all the medicines, herbs, non-prescription drugs, or dietary supplements you use. Also tell them if you smoke, drink alcohol, or use illegal drugs.  Some items may interact with your medicine. What should I watch for while using this medication? Visit your care team regularly. Tell your care team if your symptoms do not start to get better or if they get worse. You may need blood work done while you are taking this medication. You may need to follow a special diet. Talk to your care team. Foods that contain iron include: whole grains/cereals, dried fruits, beans, or peas, leafy green vegetables, and organ meats (liver, kidney). What side effects may I notice from receiving this medication? Side effects that you should report to your care team as soon as possible: Allergic reactions--skin rash, itching, hives, swelling of the face, lips, tongue, or throat Low blood pressure--dizziness, feeling faint or lightheaded, blurry vision Shortness of breath Side effects that usually do not require medical attention (report to your care team if they continue or are bothersome): Flushing Headache Joint pain Muscle pain Nausea Pain, redness, or irritation at injection site This list may not describe all possible side effects. Call your doctor for medical advice about side effects. You may report side effects to FDA at 1-800-FDA-1088. Where should I keep my medication? This medication is given in a hospital or clinic and will not be stored at home. NOTE: This sheet is a summary. It may not cover all possible information. If you have questions about this medicine, talk to your doctor, pharmacist, or health care provider.  2023 Elsevier/Gold Standard (2007-08-15 00:00:00)  Blood Transfusion Information In this this video, you will learn about the benefits and risks of a blood transfusion. To view the  content, go to this web address: [email or web address].elsevier.com/[email or web address]  This video will expire on: 12/29/2023. If you need access to this video following this date, please reach out to the healthcare provider who assigned it to you. This information is not  intended to replace advice given to you by your health care provider. Make sure you discuss any questions you have with your health care provider. Elsevier Patient Education  [place].

## 2022-04-10 ENCOUNTER — Other Ambulatory Visit: Payer: Self-pay | Admitting: *Deleted

## 2022-04-10 ENCOUNTER — Telehealth: Payer: Self-pay | Admitting: Family Medicine

## 2022-04-10 LAB — BPAM RBC
Blood Product Expiration Date: 202310292359
Blood Product Expiration Date: 202310292359
ISSUE DATE / TIME: 202310031053
ISSUE DATE / TIME: 202310031053
Unit Type and Rh: 6200
Unit Type and Rh: 6200

## 2022-04-10 LAB — TYPE AND SCREEN
ABO/RH(D): A POS
Antibody Screen: NEGATIVE
Unit division: 0
Unit division: 0

## 2022-04-10 MED ORDER — AMLODIPINE BESYLATE 5 MG PO TABS: 5.0000 mg | ORAL_TABLET | Freq: Every day | ORAL | 3 refills | Status: DC

## 2022-04-10 NOTE — Telephone Encounter (Signed)
  LAST APPOINTMENT DATE: 03/27/22  NEXT APPOINTMENT DATE:  04/17/22--Not with PCP  MEDICATION:  amLODipine (NORVASC) 5 MG tablet   Is the patient out of medication? 5 days left  PHARMACY: [facility] at [address] [place], [place] [address]  Medication previously prescribed while patient was in hospital by Dr. Tamsen Meek. Discussed medication with PCP in 03/27/22 OV.

## 2022-04-10 NOTE — Telephone Encounter (Signed)
Rx sent to the pharmacy.

## 2022-04-11 ENCOUNTER — Ambulatory Visit: Payer: Medicare HMO | Admitting: Internal Medicine

## 2022-04-11 ENCOUNTER — Other Ambulatory Visit: Payer: Self-pay

## 2022-04-11 ENCOUNTER — Encounter (HOSPITAL_COMMUNITY): Payer: Self-pay

## 2022-04-11 ENCOUNTER — Inpatient Hospital Stay (HOSPITAL_COMMUNITY)
Admission: EM | Admit: 2022-04-11 | Discharge: 2022-04-30 | DRG: 374 | Disposition: A | Discharge status: Skilled Nursing Facility | Payer: Medicare HMO | Attending: Internal Medicine | Admitting: Internal Medicine

## 2022-04-11 ENCOUNTER — Emergency Department (HOSPITAL_COMMUNITY): Payer: Medicare HMO

## 2022-04-11 ENCOUNTER — Telehealth: Payer: Self-pay | Admitting: Family Medicine

## 2022-04-11 ENCOUNTER — Telehealth: Payer: Self-pay | Admitting: Physician Assistant

## 2022-04-11 ENCOUNTER — Ambulatory Visit: Payer: Medicare HMO | Admitting: Neurology

## 2022-04-11 DIAGNOSIS — J9622 Acute and chronic respiratory failure with hypercapnia: Secondary | ICD-10-CM | POA: Diagnosis not present

## 2022-04-11 DIAGNOSIS — I680 Cerebral amyloid angiopathy: Secondary | ICD-10-CM | POA: Diagnosis present

## 2022-04-11 DIAGNOSIS — R933 Abnormal findings on diagnostic imaging of other parts of digestive tract: Secondary | ICD-10-CM

## 2022-04-11 DIAGNOSIS — G7000 Myasthenia gravis without (acute) exacerbation: Secondary | ICD-10-CM | POA: Diagnosis present

## 2022-04-11 DIAGNOSIS — Z880 Allergy status to penicillin: Secondary | ICD-10-CM

## 2022-04-11 DIAGNOSIS — K6289 Other specified diseases of anus and rectum: Secondary | ICD-10-CM

## 2022-04-11 DIAGNOSIS — G9341 Metabolic encephalopathy: Secondary | ICD-10-CM | POA: Diagnosis not present

## 2022-04-11 DIAGNOSIS — E860 Dehydration: Secondary | ICD-10-CM | POA: Diagnosis not present

## 2022-04-11 DIAGNOSIS — Z7901 Long term (current) use of anticoagulants: Secondary | ICD-10-CM

## 2022-04-11 DIAGNOSIS — L89611 Pressure ulcer of right heel, stage 1: Secondary | ICD-10-CM | POA: Diagnosis present

## 2022-04-11 DIAGNOSIS — A419 Sepsis, unspecified organism: Secondary | ICD-10-CM | POA: Diagnosis present

## 2022-04-11 DIAGNOSIS — Z87891 Personal history of nicotine dependence: Secondary | ICD-10-CM

## 2022-04-11 DIAGNOSIS — J9602 Acute respiratory failure with hypercapnia: Secondary | ICD-10-CM

## 2022-04-11 DIAGNOSIS — N3091 Cystitis, unspecified with hematuria: Secondary | ICD-10-CM | POA: Diagnosis present

## 2022-04-11 DIAGNOSIS — I609 Nontraumatic subarachnoid hemorrhage, unspecified: Secondary | ICD-10-CM | POA: Diagnosis not present

## 2022-04-11 DIAGNOSIS — E854 Organ-limited amyloidosis: Secondary | ICD-10-CM | POA: Diagnosis present

## 2022-04-11 DIAGNOSIS — R350 Frequency of micturition: Secondary | ICD-10-CM | POA: Diagnosis present

## 2022-04-11 DIAGNOSIS — Z952 Presence of prosthetic heart valve: Secondary | ICD-10-CM

## 2022-04-11 DIAGNOSIS — I2781 Cor pulmonale (chronic): Secondary | ICD-10-CM | POA: Diagnosis present

## 2022-04-11 DIAGNOSIS — C20 Malignant neoplasm of rectum: Principal | ICD-10-CM | POA: Diagnosis present

## 2022-04-11 DIAGNOSIS — Z85528 Personal history of other malignant neoplasm of kidney: Secondary | ICD-10-CM

## 2022-04-11 DIAGNOSIS — M1990 Unspecified osteoarthritis, unspecified site: Secondary | ICD-10-CM | POA: Diagnosis present

## 2022-04-11 DIAGNOSIS — D631 Anemia in chronic kidney disease: Secondary | ICD-10-CM | POA: Diagnosis present

## 2022-04-11 DIAGNOSIS — N179 Acute kidney failure, unspecified: Secondary | ICD-10-CM | POA: Diagnosis present

## 2022-04-11 DIAGNOSIS — F05 Delirium due to known physiological condition: Secondary | ICD-10-CM | POA: Diagnosis not present

## 2022-04-11 DIAGNOSIS — Z823 Family history of stroke: Secondary | ICD-10-CM

## 2022-04-11 DIAGNOSIS — K621 Rectal polyp: Secondary | ICD-10-CM | POA: Diagnosis present

## 2022-04-11 DIAGNOSIS — I130 Hypertensive heart and chronic kidney disease with heart failure and stage 1 through stage 4 chronic kidney disease, or unspecified chronic kidney disease: Secondary | ICD-10-CM | POA: Diagnosis present

## 2022-04-11 DIAGNOSIS — N1831 Chronic kidney disease, stage 3a: Secondary | ICD-10-CM

## 2022-04-11 DIAGNOSIS — R3129 Other microscopic hematuria: Secondary | ICD-10-CM | POA: Diagnosis present

## 2022-04-11 DIAGNOSIS — D329 Benign neoplasm of meninges, unspecified: Secondary | ICD-10-CM | POA: Diagnosis present

## 2022-04-11 DIAGNOSIS — K5731 Diverticulosis of large intestine without perforation or abscess with bleeding: Secondary | ICD-10-CM | POA: Diagnosis not present

## 2022-04-11 DIAGNOSIS — Z8673 Personal history of transient ischemic attack (TIA), and cerebral infarction without residual deficits: Secondary | ICD-10-CM

## 2022-04-11 DIAGNOSIS — Z888 Allergy status to other drugs, medicaments and biological substances status: Secondary | ICD-10-CM

## 2022-04-11 DIAGNOSIS — J9621 Acute and chronic respiratory failure with hypoxia: Secondary | ICD-10-CM | POA: Diagnosis not present

## 2022-04-11 DIAGNOSIS — R310 Gross hematuria: Secondary | ICD-10-CM | POA: Diagnosis present

## 2022-04-11 DIAGNOSIS — Z6825 Body mass index (BMI) 25.0-25.9, adult: Secondary | ICD-10-CM

## 2022-04-11 DIAGNOSIS — D126 Benign neoplasm of colon, unspecified: Secondary | ICD-10-CM | POA: Diagnosis present

## 2022-04-11 DIAGNOSIS — Z79899 Other long term (current) drug therapy: Secondary | ICD-10-CM

## 2022-04-11 DIAGNOSIS — D62 Acute posthemorrhagic anemia: Secondary | ICD-10-CM | POA: Diagnosis not present

## 2022-04-11 DIAGNOSIS — R7881 Bacteremia: Secondary | ICD-10-CM

## 2022-04-11 DIAGNOSIS — K922 Gastrointestinal hemorrhage, unspecified: Secondary | ICD-10-CM | POA: Diagnosis present

## 2022-04-11 DIAGNOSIS — E874 Mixed disorder of acid-base balance: Secondary | ICD-10-CM | POA: Diagnosis not present

## 2022-04-11 DIAGNOSIS — Z881 Allergy status to other antibiotic agents status: Secondary | ICD-10-CM

## 2022-04-11 DIAGNOSIS — N1832 Chronic kidney disease, stage 3b: Secondary | ICD-10-CM | POA: Diagnosis present

## 2022-04-11 DIAGNOSIS — I4819 Other persistent atrial fibrillation: Secondary | ICD-10-CM | POA: Diagnosis present

## 2022-04-11 DIAGNOSIS — N401 Enlarged prostate with lower urinary tract symptoms: Secondary | ICD-10-CM | POA: Diagnosis present

## 2022-04-11 DIAGNOSIS — K921 Melena: Secondary | ICD-10-CM | POA: Diagnosis not present

## 2022-04-11 DIAGNOSIS — N400 Enlarged prostate without lower urinary tract symptoms: Secondary | ICD-10-CM

## 2022-04-11 DIAGNOSIS — N39498 Other specified urinary incontinence: Secondary | ICD-10-CM | POA: Diagnosis present

## 2022-04-11 DIAGNOSIS — L89156 Pressure-induced deep tissue damage of sacral region: Secondary | ICD-10-CM | POA: Diagnosis not present

## 2022-04-11 DIAGNOSIS — K641 Second degree hemorrhoids: Secondary | ICD-10-CM | POA: Diagnosis present

## 2022-04-11 DIAGNOSIS — N19 Unspecified kidney failure: Secondary | ICD-10-CM | POA: Diagnosis present

## 2022-04-11 DIAGNOSIS — Z7952 Long term (current) use of systemic steroids: Secondary | ICD-10-CM

## 2022-04-11 DIAGNOSIS — J9601 Acute respiratory failure with hypoxia: Secondary | ICD-10-CM

## 2022-04-11 DIAGNOSIS — E8809 Other disorders of plasma-protein metabolism, not elsewhere classified: Secondary | ICD-10-CM | POA: Diagnosis present

## 2022-04-11 DIAGNOSIS — R3915 Urgency of urination: Secondary | ICD-10-CM | POA: Diagnosis present

## 2022-04-11 DIAGNOSIS — D509 Iron deficiency anemia, unspecified: Secondary | ICD-10-CM | POA: Diagnosis present

## 2022-04-11 DIAGNOSIS — I5033 Acute on chronic diastolic (congestive) heart failure: Secondary | ICD-10-CM

## 2022-04-11 DIAGNOSIS — I10 Essential (primary) hypertension: Secondary | ICD-10-CM | POA: Diagnosis present

## 2022-04-11 DIAGNOSIS — K449 Diaphragmatic hernia without obstruction or gangrene: Secondary | ICD-10-CM | POA: Diagnosis present

## 2022-04-11 DIAGNOSIS — Z905 Acquired absence of kidney: Secondary | ICD-10-CM

## 2022-04-11 DIAGNOSIS — L8990 Pressure ulcer of unspecified site, unspecified stage: Secondary | ICD-10-CM | POA: Insufficient documentation

## 2022-04-11 DIAGNOSIS — C642 Malignant neoplasm of left kidney, except renal pelvis: Secondary | ICD-10-CM | POA: Diagnosis present

## 2022-04-11 DIAGNOSIS — I5043 Acute on chronic combined systolic (congestive) and diastolic (congestive) heart failure: Secondary | ICD-10-CM | POA: Diagnosis not present

## 2022-04-11 DIAGNOSIS — J189 Pneumonia, unspecified organism: Secondary | ICD-10-CM | POA: Diagnosis present

## 2022-04-11 DIAGNOSIS — I4729 Other ventricular tachycardia: Secondary | ICD-10-CM

## 2022-04-11 DIAGNOSIS — I493 Ventricular premature depolarization: Secondary | ICD-10-CM | POA: Diagnosis present

## 2022-04-11 DIAGNOSIS — E441 Mild protein-calorie malnutrition: Secondary | ICD-10-CM | POA: Diagnosis not present

## 2022-04-11 DIAGNOSIS — Z66 Do not resuscitate: Secondary | ICD-10-CM | POA: Diagnosis not present

## 2022-04-11 LAB — COMPREHENSIVE METABOLIC PANEL
ALT: 21 U/L (ref 0–44)
AST: 20 U/L (ref 15–41)
Albumin: 3 g/dL — ABNORMAL LOW (ref 3.5–5.0)
Alkaline Phosphatase: 69 U/L (ref 38–126)
Anion gap: 6 (ref 5–15)
BUN: 46 mg/dL — ABNORMAL HIGH (ref 8–23)
CO2: 29 mmol/L (ref 22–32)
Calcium: 11 mg/dL — ABNORMAL HIGH (ref 8.9–10.3)
Chloride: 104 mmol/L (ref 98–111)
Creatinine, Ser: 1.4 mg/dL — ABNORMAL HIGH (ref 0.61–1.24)
GFR, Estimated: 49 mL/min — ABNORMAL LOW (ref >60)
Glucose, Bld: 110 mg/dL — ABNORMAL HIGH (ref 70–99)
Potassium: 4.5 mmol/L (ref 3.5–5.1)
Sodium: 139 mmol/L (ref 135–145)
Total Bilirubin: 1.1 mg/dL (ref 0.3–1.2)
Total Protein: 6.7 g/dL (ref 6.5–8.1)

## 2022-04-11 LAB — CBC WITH DIFFERENTIAL/PLATELET
Abs Immature Granulocytes: 0.1 10*3/uL — ABNORMAL HIGH (ref 0.00–0.07)
Basophils Absolute: 0 10*3/uL (ref 0.0–0.1)
Basophils Relative: 0 %
Eosinophils Absolute: 0 10*3/uL (ref 0.0–0.5)
Eosinophils Relative: 0 %
HCT: 30.3 % — ABNORMAL LOW (ref 39.0–52.0)
Hemoglobin: 9.2 g/dL — ABNORMAL LOW (ref 13.0–17.0)
Immature Granulocytes: 1 %
Lymphocytes Relative: 7 %
Lymphs Abs: 0.9 10*3/uL (ref 0.7–4.0)
MCH: 28.9 pg (ref 26.0–34.0)
MCHC: 30.4 g/dL (ref 30.0–36.0)
MCV: 95.3 fL (ref 80.0–100.0)
Monocytes Absolute: 0.6 10*3/uL (ref 0.1–1.0)
Monocytes Relative: 5 %
Neutro Abs: 11.1 10*3/uL — ABNORMAL HIGH (ref 1.7–7.7)
Neutrophils Relative %: 87 %
Platelets: 239 10*3/uL (ref 150–400)
RBC: 3.18 MIL/uL — ABNORMAL LOW (ref 4.22–5.81)
RDW: 15.1 % (ref 11.5–15.5)
WBC: 12.6 10*3/uL — ABNORMAL HIGH (ref 4.0–10.5)
nRBC: 0 % (ref 0.0–0.2)

## 2022-04-11 LAB — URINALYSIS, ROUTINE W REFLEX MICROSCOPIC
Bilirubin Urine: NEGATIVE
Glucose, UA: NEGATIVE mg/dL
Ketones, ur: NEGATIVE mg/dL
Nitrite: NEGATIVE
Protein, ur: NEGATIVE mg/dL
RBC / HPF: >50 RBC/hpf — ABNORMAL HIGH (ref 0–5)
Specific Gravity, Urine: 1.013 (ref 1.005–1.030)
pH: 5 (ref 5.0–8.0)

## 2022-04-11 LAB — TYPE AND SCREEN
ABO/RH(D): A POS
Antibody Screen: NEGATIVE

## 2022-04-11 LAB — TROPONIN I (HIGH SENSITIVITY)
Troponin I (High Sensitivity): 29 ng/L — ABNORMAL HIGH (ref <18)
Troponin I (High Sensitivity): 25 ng/L — ABNORMAL HIGH (ref <18)

## 2022-04-11 LAB — PROTIME-INR
INR: 3.2 — ABNORMAL HIGH (ref 0.8–1.2)
Prothrombin Time: 32.1 seconds — ABNORMAL HIGH (ref 11.4–15.2)

## 2022-04-11 LAB — LACTIC ACID, PLASMA: Lactic Acid, Venous: 1.4 mmol/L (ref 0.5–1.9)

## 2022-04-11 LAB — POC OCCULT BLOOD, ED: Fecal Occult Bld: NEGATIVE

## 2022-04-11 MED ORDER — SODIUM CHLORIDE 0.9 % IV SOLN
1.0000 g | Freq: Once | INTRAVENOUS | Status: AC
  Administered 2022-04-11: 1 g via INTRAVENOUS
  Filled 2022-04-11: qty 10

## 2022-04-11 MED ORDER — ACETAMINOPHEN 325 MG PO TABS
650.0000 mg | ORAL_TABLET | Freq: Four times a day (QID) | ORAL | Status: DC | PRN
  Administered 2022-04-15 – 2022-04-29 (×5): 650 mg via ORAL
  Filled 2022-04-11 (×5): qty 2

## 2022-04-11 MED ORDER — SODIUM CHLORIDE 0.9 % IV BOLUS
1000.0000 mL | Freq: Once | INTRAVENOUS | Status: AC
  Administered 2022-04-11: 1000 mL via INTRAVENOUS

## 2022-04-11 MED ORDER — SODIUM CHLORIDE 0.9 % IV SOLN
1.0000 g | INTRAVENOUS | Status: DC
  Administered 2022-04-12 – 2022-04-15 (×4): 1 g via INTRAVENOUS
  Filled 2022-04-11 (×4): qty 10

## 2022-04-11 MED ORDER — PANTOPRAZOLE SODIUM 40 MG IV SOLR
40.0000 mg | Freq: Once | INTRAVENOUS | Status: AC
  Administered 2022-04-11: 40 mg via INTRAVENOUS
  Filled 2022-04-11: qty 10

## 2022-04-11 MED ORDER — ACETAMINOPHEN 650 MG RE SUPP: 650.0000 mg | Freq: Four times a day (QID) | RECTAL | Status: DC | PRN

## 2022-04-11 MED ORDER — LACTATED RINGERS IV SOLN: INTRAVENOUS | Status: AC

## 2022-04-11 NOTE — H&P (Signed)
History and Physical    PLEASE NOTE THAT DRAGON DICTATION SOFTWARE WAS USED IN THE CONSTRUCTION OF THIS NOTE.   Daniel Sharp HKV:[id number] DOB: [birth year] DOA: 04/11/2022  PCP: [name], MD  Patient coming from: home   I have personally briefly reviewed patient's old medical records in [place]  Chief Complaint: Dark-colored stool  HPI: Daniel Sharp is a 88 y.o. 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   Following history is provided by the patient as well as the patient's son who is present at bedside, in addition to my discussions with the EDP and via chart review.  For the last few months, the patient has been experiencing recurrent dark-colored stool for which she has been evaluated by [facility] gastroenterology and is scheduled for EGD/colonoscopy with Dr. Tarri Glenn As an outpatient on April 18, 2022.  He also has a history of chronic iron deficiency anemia associated baseline hemoglobin 8-10, and has been requiring recurrent blood transfusions as an outpatient.  Specifically, he has required transfusion of 3 units PRBC over the last 9 days, with most recent transfusion occurring on 04-08-22.  In the setting of a history of paroxysmal atrial fibrillation he is chronically anticoagulated on warfarin, noting no additional blood thinners as an outpatient, including no concomitant aspirin use.  In this context, the patient reports that he experienced  another episode of dark-colored stool yesterday, in the absence of any bright red blood per rectum.  He is on daily oral iron supplementation as an outpatient.  Denies any associated chest pain, shortness of breath, palpitations, diaphoresis, nausea, vomiting, dizziness, presyncope, or syncope.  No hemoptysis.  No recent trauma.  Patient's medical history is also notable for kidney cancer status post left nephrectomy in July 2023 with Dr. Lovena Neighbours Of urology at [facility].  Yesterday, the patient noted new onset gross hematuria, stating that he noted some bright red blood in his diaper, which he believed was urinary in source.  He reports associated 1 to 2 days of suprapubic discomfort in the absence of dysuria or change in urinary urgency/frequency.  Denies any recent subjective fever, chills, rigors, or generalized myalgias.  No recent cough.  Urinary history also notable for BPH for which he is on Flomax at home.  He has a history of stage IIIa CKD 6 with baseline creatinine 1.3-1.6, most recent prior serum creatinine data point noted to be 1.33 on 03/27/2022.     ED Course:  Vital signs in the ED were notable for the following: Digamex 99.0; heart rate initially 92, which subsequent decreased into the 80s following interval IV fluids; systolic blood pressures in the 120s to 130s; respiratory rate 18-23; oxygen saturation 94 to 96% on room air.  Labs were notable for the following: CMP notable for the following: Bicarbonate 29, BUN 46 compared to 32 on 03/27/2022, creatinine 1.40, calcium, adjusted for mild hypoalbuminemia noted to be 11.8 relative to most recent prior adjusted calcium level of 11.3 on 03/27/2022, otherwise liver enzymes within normal limits.  High-sensitivity troponin I initially 29, 3 cefatrizine 1025.  CBC notable for white cell  count 12,600 with 87% neutrophils, hemoglobin 9.2 associated normocytic/normochromic properties as well as nonelevated RDW, relative to most recent prior  hemoglobin value of 7.8 on 10-23, platelet count 239.  INR 3.2.  Lactic acid 1.4.  Urinalysis associated with a clear yellow sample and notable for 11-20 white blood cells as well as greater than 50 RBCs.  DRE performed by EDP showed brown-colored stool that was fecal occult blood negative.  Urine culture collected prior to initiation of IV antibiotics.  Imaging and additional notable ED work-up: EKG showed atrial fibrillation with multiple PVCs, rate 89, nonspecific T wave inversion in aVL, no evidence of ST changes, including no evidence of ST elevation.  CT abdomen/pelvis without contrast showed no evidence of acute intra-abdominal or acute intrapelvic process, while showing interval left nephrectomy as well as descending sigmoid and colonic diverticulosis in the absence of evidence of diverticulitis obstruction, perforation, or abscess.  Additionally, CT abdomen/pelvis showed marked prostatic enlargement, as well as suggestion of right will mass.  While in the ED, the following were administered: Protonix 40 mg IV x1, Rocephin, normal symptoms with interval loss.  Subsequently, the patient was admitted for further evaluation management microscopic hematuria in the setting is suspected hemorrhagic cystitis complicated by sepsis, with presentation also notable for clinical suspicion for dehydration.     Review of Systems: As per HPI otherwise 10 point review of systems negative.   Past Medical History:  Diagnosis Date   A-fib [facility])    history of   Arthritis    Dyspnea    H/O mitral valve repair 07/2003   Hypertension    Ocular myasthenia gravis ([place])    takes pred   Pneumonia     Past Surgical History:  Procedure Laterality Date   APPENDECTOMY     BACK SURGERY     cyst removed L3 and L4   CHOLECYSTECTOMY     FINGER SURGERY Right    R hand   MITRAL VALVE REPAIR     on coumadin   NOSE SURGERY     ROBOTIC ASSITED PARTIAL NEPHRECTOMY Left 01/22/2022   Procedure: XI ROBOTIC ASSITED  NEPHRECTOMY;  Surgeon: Ceasar Mons, MD;  Location: [facility] ORS;  Service: Urology;  Laterality: Left;    Social History:  reports that he has quit smoking. His smoking use included cigarettes, pipe, and cigars. He has quit using smokeless tobacco.  His smokeless tobacco use included chew. He reports current alcohol use of about 7.0 standard drinks of alcohol per week. He reports that he does not use drugs.   No Known Allergies  Family History  Problem Relation Age of Onset   Other Mother        Gallbladder removed   Stroke Father        intracranial stroke   Stroke Brother    Colon cancer Neg Hx    Rectal cancer Neg Hx     Family history reviewed and not pertinent    Prior to Admission medications   Medication Sig Start Date End Date Taking? Authorizing Provider  amLODipine (NORVASC) 5 MG tablet Take 1 tablet (5 mg total) by mouth daily. 04/10/22   [name], MD  Cholecalciferol (VITAMIN D3 PO) Take 1 tablet by mouth daily.    [provider]  docusate sodium (COLACE) 100 MG capsule Take 1 capsule (100 mg total) by mouth 2 (two) times daily. 01/22/22   Debbrah Alar, PA-C  ferrous sulfate 325 (65 FE) MG tablet Take 325 mg by mouth  daily with breakfast.    [provider]  furosemide (LASIX) 20 MG tablet Take 20 mg by mouth daily.    [provider]  glucosamine-chondroitin 500-400 MG tablet Take 1 tablet by mouth daily.    [provider]  HYDROcodone-acetaminophen (NORCO/VICODIN) 5-325 MG tablet Take 1-2 tablets by mouth every 6 (six) hours as needed for moderate pain or severe pain. 02/19/22   Shelly Coss, MD  pantoprazole (PROTONIX) 40 MG tablet Take 1 tablet (40 mg total) by mouth daily. 03/29/22   [name], MD  predniSONE (DELTASONE) 5 MG tablet Take 2 tablets (10 mg total) by mouth daily. Patient taking differently: Take 10 mg by mouth daily. Taking 2 tabs by mouth daily 02/11/22   Narda Amber K, DO  pyridostigmine  (MESTINON) 60 MG tablet Take 1 tablet (60 mg total) by mouth every 8 (eight) hours. 02/19/22   Shelly Coss, MD  tamsulosin (FLOMAX) 0.4 MG CAPS capsule Take 0.4 mg by mouth daily.    [provider]  warfarin (COUMADIN) 5 MG tablet Take 2.5-5 mg by mouth as directed. Take 1 tablet (5 mg) every Mon. Weds.fri  sat sun    [provider]     Objective    Physical Exam: Vitals:   04/11/22 1801 04/11/22 2057 04/11/22 2058 04/11/22 2151  BP:  132/70    Pulse:  61 61   Resp:  (!) 22 (!) 22   Temp: 98.9 F (37.2 C)   98.9 F (37.2 C)  TempSrc:      SpO2:  96%    Weight:      Height:        General: appears to be stated age; alert, oriented Skin: warm, dry, no rash Head:  AT/[place] Mouth:  Oral mucosa membranes appear dry, normal dentition Neck: supple; trachea midline Heart:  RRR; did not appreciate any M/R/G Lungs: CTAB, did not appreciate any wheezes, rales, or rhonchi Abdomen: + BS; soft, ND, NT Vascular: 2+ pedal pulses b/l; 2+ radial pulses b/l Extremities: no peripheral edema, no muscle wasting Neuro: strength and sensation intact in upper and lower extremities b/l     Labs on Admission: I have personally reviewed following labs and imaging studies  CBC: Recent Labs  Lab 04/08/22 1036 04/11/22 1411  WBC 9.1 12.6*  NEUTROABS 7.3 11.1*  HGB 7.8 Repeated and verified X2.* 9.2*  HCT 23.5* 30.3*  MCV 87.2 95.3  PLT 238.0 329   Basic Metabolic Panel: Recent Labs  Lab 04/11/22 1411  NA 139  K 4.5  CL 104  CO2 29  GLUCOSE 110*  BUN 46*  CREATININE 1.40*  CALCIUM 11.0*   GFR: Estimated Creatinine Clearance: 41.4 mL/min (A) (by C-G formula based on SCr of 1.4 mg/dL (H)). Liver Function Tests: Recent Labs  Lab 04/11/22 1411  AST 20  ALT 21  ALKPHOS 69  BILITOT 1.1  PROT 6.7  ALBUMIN 3.0*   No results for input(s): "LIPASE", "AMYLASE" in the last 168 hours. No results for input(s): "AMMONIA" in the last 168 hours. Coagulation  Profile: Recent Labs  Lab 04/11/22 1411  INR 3.2*   Cardiac Enzymes: No results for input(s): "CKTOTAL", "CKMB", "CKMBINDEX", "TROPONINI" in the last 168 hours. BNP (last 3 results) No results for input(s): "PROBNP" in the last 8760 hours. HbA1C: No results for input(s): "HGBA1C" in the last 72 hours. CBG: No results for input(s): "GLUCAP" in the last 168 hours. Lipid Profile: No results for input(s): "CHOL", "HDL", "LDLCALC", "TRIG", "CHOLHDL", "  LDLDIRECT" in the last 72 hours. Thyroid Function Tests: No results for input(s): "TSH", "T4TOTAL", "FREET4", "T3FREE", "THYROIDAB" in the last 72 hours. Anemia Panel: No results for input(s): "VITAMINB12", "FOLATE", "FERRITIN", "TIBC", "IRON", "RETICCTPCT" in the last 72 hours. Urine analysis:    Component Value Date/Time   COLORURINE YELLOW 04/11/2022 [place] 04/11/2022 1445   LABSPEC 1.013 04/11/2022 1445   PHURINE 5.0 04/11/2022 1445   GLUCOSEU NEGATIVE 04/11/2022 1445   HGBUR LARGE (A) 04/11/2022 1445   BILIRUBINUR NEGATIVE 04/11/2022 1445   KETONESUR NEGATIVE 04/11/2022 1445   PROTEINUR NEGATIVE 04/11/2022 1445   NITRITE NEGATIVE 04/11/2022 1445   LEUKOCYTESUR TRACE (A) 04/11/2022 1445    Radiological Exams on Admission: CT ABDOMEN PELVIS WO CONTRAST  Result Date: 04/11/2022 CLINICAL DATA:  Abdominal pain, acute, nonlocalized, hematuria. EXAM: CT ABDOMEN AND PELVIS WITHOUT CONTRAST TECHNIQUE: Multidetector CT imaging of the abdomen and pelvis was performed following the standard protocol without IV contrast. RADIATION DOSE REDUCTION: This exam was performed according to the departmental dose-optimization program which includes automated exposure control, adjustment of the mA and/or kV according to patient size and/or use of iterative reconstruction technique. COMPARISON:  None Available. FINDINGS: Lower chest: Small right pleural effusion, new since prior examination. Moderate coronary artery calcification.  Mitral valve replacement has been performed. Cardiac size is at the upper limits of normal. Hepatobiliary: No focal liver abnormality is seen. Status post cholecystectomy. No biliary dilatation. Pancreas: Unremarkable Spleen: Unremarkable Adrenals/Urinary Tract: The adrenal glands are unremarkable. Interval left nephrectomy. Residual right kidney is normal in size and position. No hydronephrosis. No intrarenal or ureteral calculi. The bladder is unremarkable. Stomach/Bowel: Moderate sigmoid and descending colonic diverticulosis without superimposed acute inflammatory change. There is polypoid soft tissue within the rectum, not well assessed on this noncontrast examination but persistent since prior examination suspicious for an underlying rectal mass best seen on axial image # 78/2. No evidence of obstruction or focal inflammation. The stomach, small bowel, and large bowel are otherwise unremarkable. Status post appendectomy. Vascular/Lymphatic: Moderate aortoiliac atherosclerotic calcification. No aortic aneurysm. No pathologic adenopathy within the abdomen and pelvis. Reproductive: Marked prostatic enlargement again noted. Other: No abdominal wall hernia. Musculoskeletal: No acute bone abnormality. No lytic or blastic bone lesion. Osseous structures are age-appropriate. IMPRESSION: 1. No acute intra-abdominal pathology identified. No definite radiographic explanation for the patient's reported symptoms. 2. Interval left nephrectomy. 3. Polypoid soft tissue within the rectum, not well assessed on this noncontrast examination but suspicious for an underlying rectal mass. Correlation with endoscopy is recommended. 4. Moderate descending and sigmoid colonic diverticulosis without superimposed acute inflammatory change. 5. Marked prostatic enlargement. 6. Small right pleural effusion, new since prior examination. Aortic Atherosclerosis (ICD10-I70.0). Electronically Signed   By: Fidela Salisbury M.D.   On: 04/11/2022  22:01     EKG: Independently reviewed, with result as described above.    Assessment/Plan    Principal Problem:   Microscopic hematuria Active Problems:   Hypertension   Persistent atrial fibrillation (HCC)   Hypercalcemia   Gross hematuria   Upper GI bleed   Sepsis ([place])   Hemorrhagic cystitis   Dehydration   Acute prerenal azotemia      #) Microscopic hematuria: Patient reports new onset gross hematuria over the last day, although urinalysis is associated with a clear yellow appearing specimen with greater than 50 RBCs, suspicious for hemorrhagic cystitis in the setting of significant pyuria concomitant with new onset suprapubic discomfort along with positive SIRS criteria.  Of note, he is  chronically anticoagulated on warfarin, with mildly supratherapeutic pubic INR presentation, but otherwise no additional blood thinners as an outpatient.  Hemoglobin consistent with baseline.  Appears hemodynamically stable, and otherwise asymptomatic.  Plan: Further evaluation management of suspected hemorrhagic cystitis, including continuation of IV Rocephin, as further detailed below.  Continuous lactated Ringer's.  Monitor on symmetry.  Every 4 hour H&H's ordered through 9 AM on 04/12/2022.  Hold next dose of warfarin, with repeat INR ordered for the morning.  Repeat CBC in the morning.  Add on iron studies.  Type and screen ordered.  May consider discussing with urology versus outpatient follow-up in the context of a history of recent left nephrectomy.         #) Sepsis due to hemorrhagic cystitis: In the context of new onset suprapubic discomfort, presenting urinalysis sissy with significant pyuria, with SIRS criteria positive via leukocytosis, tachypnea.  However, no evidence of endorgan damage to meet criteria for severe sepsis, including nonelevated lactic acid, with presenting value of 1.4.  No evidence of additional underlying infectious process at this time.  Urine culture  collected prior to initiation of Rocephin, which will be continued for now.  Of note, patient received Rocephin prior to collection of blood cultures.  Plan: Continuous IV fluids.  Follow-up result of urine culture.  Continue Rocephin.  Repeat CBC differential in the morning.  Other evaluation management of presenting microscopic materia, as above.          #) Chronic iron deficiency anemia: In the setting of chronic blood loss, including from suspected subacute upper gastrointestinal bleed, patient has a baseline hemoglobin range of 8-10, and has required transfusion of 3 units PBC over the course the last 9 days.  Ever, presenting hemoglobin found to be within baseline range, trending up from most recent prior value of 7.8 on 04/08/2022.  Given the patient's reported new onset gross hematuria, will closely monitor considering hemoglobin trend via every 4 hour monitoring as further detailed below.  Hemodynamically stable.  Otherwise asymptomatic.  Given this degree of chronic blood loss, will also add on iron studies to evaluate for optimize ability from an iron infusion standpoint.  Plan: Every 4 hour H&H's ordered through 9 AM on 04/12/2022.  Hold this evening's dose of warfarin, with repeat INR ordered for the morning.  Add on iron studies.  Monitor on symmetry.  Resume home oral iron via p.o. route.  Further evaluation management of suspected subacute upper GI bleed, as further detailed below.  Further evaluation management of suspected hemorrhagic cystitis, as above.           #) Subacute upper GIB: Suspected on the basis of multiple months of intermittent dark-colored stool, with elevated BUN for this, the patient is established as an outpatient with [place] gastroenterology, and is scheduled for EGD/colonoscopy with Dr. Tarri Glenn On 04/18/2022, with corresponding instructions leading up to this endoscopic evaluation that include holding home warfarin for 5 days leading up to the scopes.   Appears to be a slow and/or intermittent upper gastrointestinal bleed given the dark coloration of the patient's stool.  However, DRE performed by EDP today revealed brown-colored stool is fecal occult blood negative.  Of note, CT abdomen/pelvis reveals evidence suggestive of rectal mass, warranting further evaluation, which is planned via colonoscopy scheduled with [facility] gastroenterology on 04/18/2022, as above.  Does not appear to be in overt indication for PRBC transfusion at this time, but will closely monitor ensuing hemoglobin trend as outlined below.  Chronic prednisone use in  the setting of ocular myasthenia gravis noted, with associated increased risk for development of pressure ulcer.   Plan: Every 4 hour H&H's through 9 AM on 04/12/2022.  Type and screen ordered.  Repeat INR in the morning after holding this evening's dose of warfarin.  Monitor on symmetry.  Patient colonoscopy/EGD with [facility] gastroenterology as scheduled for April 18, 2022, as above.                #) Hypercalcemia: Presenting adjusted calcium level of 11.8, just slightly higher than most recent prior adjusted value of 11.3 on 03/27/2022.  Suspect contribution from dehydration given clinical appearance of such.  No overt additional pharmacologic contribution as an outpatient.  We will provide interval IV fluids to attend to suspected element of dehydration, with consideration for further expansion of evaluation if serum calcium level remains elevated following interval IV fluid resuscitative efforts.  Plan: IV fluids, as above.  Monitor strict I's and O's and daily weights.  Repeat CMP in the morning.  Add on serum magnesium level check serum phosphorus level.                  #) Dehydration: Clinical suspicion for such, including the appearance of dry oral mucous membranes as well as laboratory findings notable for acute prerenal azotemia. Appears to be in the setting of   relative  intravascular dilation and stemming from sepsis due to hemorrhagic cystitis superimposed on chronic blood loss anemia, and exacerbated the patient Lasix, which will be held in the context of chronic right-sided systolic heart failure with increased risk for preload dependent pathophysiology.    Plan: Monitor strict I's and O's.  Daily weights.  Repeat BMP in the morning.  Gentle IV fluids overnight, as above.  Holding home Lasix for now.             #) Paroxysmal atrial fibrillation: Documented history of such. In setting of CHA2DS2-VASc score of 4, there is an indication for chronic anticoagulation for thromboembolic prophylaxis. Consistent with this, patient is chronically anticoagulated on warfarin, with presenting INR noted to be mildly supratherapeutic.  In the context subacute upper GI bleed as well as report of new onset gross hematuria, will hold this evening's dose of warfarin, and repeat INR in the morning.  No clinical indication for pharmacologic reversal at this time.. Home AV nodal blocking regimen: None.  Most recent echocardiogram August 2023, with results as further detailed above.  Presenting EKG suggestive rate controlled atrial fibrillation.   Plan: monitor strict I's & O's and daily weights. CMP/CBC in AM. Check serum mag level.  Holding home warfarin dose this evening, with plan to repeat INR in the morning.  Monitor on telemetry.           #) Chronic right-sided systolic heart failure: Documented as such, with most recent echocardiogram on 02/14/2022 showing evidence of LVEF 55%, indeterminate diastolic parameters, as well as mildly reduced right ventricular systolic function.  Patient on Lasix 20 mg p.o. daily as an outpatient.  Given increased risk for preload dependent pathophysiology, and given increased risk for intravascular depletion as a consequence of chronic blood loss anemia, will hold home Lasix for now, and closely monitor ensuing hemoglobin, as further  outlined above, will provide gentle IV fluids as a component of intravascular repletion given appearance of dehydration.  Plan: Hold home Lasix.  Monitor strict I's and O's and daily weights.  At [facility] magnesium low-grade repeat CMP in the morning.  Gentle IV fluids, as  above.          #) Essential hypertension: Documented history of such, on Norvasc, Lasix, as an outpatient will also noting home Flomax, with potential antihypertensive implications.  Normotensive in the ED this evening.  Plan: In context of presenting sepsis as well as chronic blood loss, will hold him Norvasc as well as Lasix for now.  Close very resume blood pressure via routine vital signs.  Monitor strict I's and O's Daily weights.           #) Ocular myasthenia's gravis: Document history of such, following with neurology as an outpatient, on chronic prednisone therapy as well as Mestinon.  Inclusion of prednisone notable in the context suspected subacute upper distress of bleed, which increases risk for associated pressure ulcer.  Plan: Continue prednisone and Mestinon therapy.          #) Stage IIIa CKD: Documented history of such, associated baseline creatinine range 1.3-1.6, with contribution from status post left nephrectomy in July 2023.  Presenting serum creatinine found to be within baseline range.  Plan: Monitor strict I's and O's Daily weights.  Tempt avoid nephrotoxic agents.  Repeat CMP in the morning.  Add on serum magnesium level.  Continue home Flomax.    DVT prophylaxis: SCD's   Code Status: Full code Family Communication: case d\w pt's son Disposition Plan: Per Rounding Team Consults called: none;  Admission status: obs   PLEASE NOTE THAT DRAGON DICTATION SOFTWARE WAS USED IN THE CONSTRUCTION OF THIS NOTE.   [place] DO [facility] [place]   04/11/2022, 11:30 PM

## 2022-04-11 NOTE — ED Provider Notes (Signed)
[facility] DEPT Provider Note   CSN: [id number] Arrival date & time: 04/11/22  1306     History  Chief Complaint  Patient presents with   Hematuria    Daniel Sharp is a 88 y.o. male.  Patient with a history of hypertension, A-fib on Coumadin, myasthenia gravis, mitral valve replacement and repair presenting with blood in his stool and blood in the urine.  Nephrectomy for cancer several months ago, no chemotherapy or radiation. he has had ongoing issues with blood in his stool for about 2 months.  He reports dark bloody stools tarry several times a day.  He has seen gastroenterology and is being scheduled for an endoscopy on October 12.  His son is frustrated and believes this needs to happen sooner because of patient's age and risk factors and Coumadin use.  Today they became concerned because he saw blood in his diaper which they believed was from his urine.  He is still having black stools as well.  States he had blood in his diaper that was bright red after urinating several times today.  Has diffuse crampy abdominal pain.  No back pain.  No nausea or vomiting or fever.  He feels dizzy and disoriented when he gets up but no dizziness at rest.  No chest pain or shortness of breath.  No fever.  Patient has required 3 units of blood transfusion since September 26 with his last 1 back in October 2.  He is also getting iron infusions.  They were told to come to the hospital if patient became weaker or is having worsening bleeding. He saw gastroenterology and they told him that he could soonest they could do his endoscopy was October 12.  Son is frustrated by the situation  The history is provided by the patient and a relative.  Hematuria Associated symptoms include abdominal pain. Pertinent negatives include no chest pain and no shortness of breath.       Home Medications Prior to Admission medications   Medication Sig Start Date End Date Taking? Authorizing  Provider  amLODipine (NORVASC) 5 MG tablet Take 1 tablet (5 mg total) by mouth daily. 04/10/22   [name], MD  Cholecalciferol (VITAMIN D3 PO) Take 1 tablet by mouth daily.    [provider]  docusate sodium (COLACE) 100 MG capsule Take 1 capsule (100 mg total) by mouth 2 (two) times daily. 01/22/22   Debbrah Alar, PA-C  ferrous sulfate 325 (65 FE) MG tablet Take 325 mg by mouth daily with breakfast.    [provider]  furosemide (LASIX) 20 MG tablet Take 20 mg by mouth daily.    [provider]  glucosamine-chondroitin 500-400 MG tablet Take 1 tablet by mouth daily.    [provider]  HYDROcodone-acetaminophen (NORCO/VICODIN) 5-325 MG tablet Take 1-2 tablets by mouth every 6 (six) hours as needed for moderate pain or severe pain. 02/19/22   Shelly Coss, MD  pantoprazole (PROTONIX) 40 MG tablet Take 1 tablet (40 mg total) by mouth daily. 03/29/22   [name], MD  predniSONE (DELTASONE) 5 MG tablet Take 2 tablets (10 mg total) by mouth daily. Patient taking differently: Take 10 mg by mouth daily. Taking 2 tabs by mouth daily 02/11/22   Narda Amber K, DO  pyridostigmine (MESTINON) 60 MG tablet Take 1 tablet (60 mg total) by mouth every 8 (eight) hours. 02/19/22   Shelly Coss, MD  tamsulosin (FLOMAX) 0.4 MG CAPS capsule Take 0.4 mg by  mouth daily.    [provider]  warfarin (COUMADIN) 5 MG tablet Take 2.5-5 mg by mouth as directed. Take 1 tablet (5 mg) every Mon. Weds.fri  sat sun    [provider]      Allergies    Patient has no known allergies.    Review of Systems   Review of Systems  Constitutional:  Positive for activity change, appetite change and fatigue.  HENT:  Negative for congestion and rhinorrhea.   Respiratory:  Negative for cough, chest tightness and shortness of breath.   Cardiovascular:  Negative for chest pain.  Gastrointestinal:  Positive for abdominal pain, blood in stool and nausea.   Genitourinary:  Positive for hematuria. Negative for dysuria.  Skin:  Negative for rash.  Neurological:  Positive for weakness.   all other systems are negative except as noted in the HPI and PMH.    Physical Exam Updated Vital Signs BP 130/80 (BP Location: Right Arm)   Pulse 92   Temp 98.9 F (37.2 C)   Resp 18   Ht 6' 0.5" (1.842 m)   Wt 87.1 kg   SpO2 93%   BMI 25.68 kg/m  Physical Exam Vitals and nursing note reviewed.  Constitutional:      General: He is not in acute distress.    Appearance: He is well-developed. He is not ill-appearing.  HENT:     Head: Normocephalic and atraumatic.     Mouth/Throat:     Pharynx: No oropharyngeal exudate.  Eyes:     Conjunctiva/sclera: Conjunctivae normal.     Pupils: Pupils are equal, round, and reactive to light.  Neck:     Comments: No meningismus. Cardiovascular:     Rate and Rhythm: Normal rate and regular rhythm.     Heart sounds: Normal heart sounds. No murmur heard. Pulmonary:     Effort: Pulmonary effort is normal. No respiratory distress.     Breath sounds: Normal breath sounds.  Abdominal:     Palpations: Abdomen is soft.     Tenderness: There is abdominal tenderness. There is no guarding or rebound.  Genitourinary:    Comments: Brown stool on rectal exam, no gross blood Musculoskeletal:        General: No tenderness. Normal range of motion.     Cervical back: Normal range of motion and neck supple.  Skin:    General: Skin is warm.  Neurological:     Mental Status: He is alert and oriented to person, place, and time.     Cranial Nerves: No cranial nerve deficit.     Motor: No abnormal muscle tone.     Coordination: Coordination normal.     Comments:  5/5 strength throughout. CN 2-12 intact.Equal grip strength.   Psychiatric:        Behavior: Behavior normal.     ED Results / Procedures / Treatments   Labs (all labs ordered are listed, but only abnormal results are displayed) Labs Reviewed   COMPREHENSIVE METABOLIC PANEL - Abnormal; Notable for the following components:      Result Value   Glucose, Bld 110 (*)    BUN 46 (*)    Creatinine, Ser 1.40 (*)    Calcium 11.0 (*)    Albumin 3.0 (*)    GFR, Estimated 49 (*)    All other components within normal limits  CBC WITH DIFFERENTIAL/PLATELET - Abnormal; Notable for the following components:   WBC 12.6 (*)    RBC 3.18 (*)  Hemoglobin 9.2 (*)    HCT 30.3 (*)    Neutro Abs 11.1 (*)    Abs Immature Granulocytes 0.10 (*)    All other components within normal limits  URINALYSIS, ROUTINE W REFLEX MICROSCOPIC - Abnormal; Notable for the following components:   Hgb urine dipstick LARGE (*)    Leukocytes,Ua TRACE (*)    RBC / HPF >50 (*)    Bacteria, UA RARE (*)    All other components within normal limits  PROTIME-INR - Abnormal; Notable for the following components:   Prothrombin Time 32.1 (*)    INR 3.2 (*)    All other components within normal limits  TROPONIN I (HIGH SENSITIVITY) - Abnormal; Notable for the following components:   Troponin I (High Sensitivity) 29 (*)    All other components within normal limits  TROPONIN I (HIGH SENSITIVITY) - Abnormal; Notable for the following components:   Troponin I (High Sensitivity) 25 (*)    All other components within normal limits  URINE CULTURE  LACTIC ACID, PLASMA  MAGNESIUM  LACTIC ACID, PLASMA  CBC WITH DIFFERENTIAL/PLATELET  COMPREHENSIVE METABOLIC PANEL  MAGNESIUM  PROTIME-INR  HEMOGLOBIN AND HEMATOCRIT, BLOOD  POC OCCULT BLOOD, ED  TYPE AND SCREEN    EKG None  Radiology CT ABDOMEN PELVIS WO CONTRAST  Result Date: 04/11/2022 CLINICAL DATA:  Abdominal pain, acute, nonlocalized, hematuria. EXAM: CT ABDOMEN AND PELVIS WITHOUT CONTRAST TECHNIQUE: Multidetector CT imaging of the abdomen and pelvis was performed following the standard protocol without IV contrast. RADIATION DOSE REDUCTION: This exam was performed according to the departmental dose-optimization  program which includes automated exposure control, adjustment of the mA and/or kV according to patient size and/or use of iterative reconstruction technique. COMPARISON:  None Available. FINDINGS: Lower chest: Small right pleural effusion, new since prior examination. Moderate coronary artery calcification. Mitral valve replacement has been performed. Cardiac size is at the upper limits of normal. Hepatobiliary: No focal liver abnormality is seen. Status post cholecystectomy. No biliary dilatation. Pancreas: Unremarkable Spleen: Unremarkable Adrenals/Urinary Tract: The adrenal glands are unremarkable. Interval left nephrectomy. Residual right kidney is normal in size and position. No hydronephrosis. No intrarenal or ureteral calculi. The bladder is unremarkable. Stomach/Bowel: Moderate sigmoid and descending colonic diverticulosis without superimposed acute inflammatory change. There is polypoid soft tissue within the rectum, not well assessed on this noncontrast examination but persistent since prior examination suspicious for an underlying rectal mass best seen on axial image # 78/2. No evidence of obstruction or focal inflammation. The stomach, small bowel, and large bowel are otherwise unremarkable. Status post appendectomy. Vascular/Lymphatic: Moderate aortoiliac atherosclerotic calcification. No aortic aneurysm. No pathologic adenopathy within the abdomen and pelvis. Reproductive: Marked prostatic enlargement again noted. Other: No abdominal wall hernia. Musculoskeletal: No acute bone abnormality. No lytic or blastic bone lesion. Osseous structures are age-appropriate. IMPRESSION: 1. No acute intra-abdominal pathology identified. No definite radiographic explanation for the patient's reported symptoms. 2. Interval left nephrectomy. 3. Polypoid soft tissue within the rectum, not well assessed on this noncontrast examination but suspicious for an underlying rectal mass. Correlation with endoscopy is recommended.  4. Moderate descending and sigmoid colonic diverticulosis without superimposed acute inflammatory change. 5. Marked prostatic enlargement. 6. Small right pleural effusion, new since prior examination. Aortic Atherosclerosis (ICD10-I70.0). Electronically Signed   By: Fidela Salisbury M.D.   On: 04/11/2022 22:01    Procedures Procedures    Medications Ordered in ED Medications  sodium chloride 0.9 % bolus 1,000 mL (has no administration in time range)  ED Course/ Medical Decision Making/ A&P                           Hematuria and painful urination for the past 2 days.  Ongoing rectal bleeding for the past several months.  Vitals are stable, no distress. Gentle IVF given. Hemoglobin today is 9.2 which is improved from 7.8 3 days ago. UA today is gross blood in it.  Bladder scan is 50 cc.  Creatinine 1.4 which appears to be near baseline.  Hemoglobin today is 9.2 as above.  Hemoccult is negative.  Orthostatics are negative.  CT scan will be obtained to evaluate for the source of his hematuria.  He did have a renal mass removed in July and was told he is now cancer free.  CT without acute findings. No etiology of hematuria. Does show suspicious rectal wall thickening with need for endoscopy. D/w patient and son.  Will treat for possible hemorrhagic cystitis. Urine culture sent. No evidence of recurrent kidney mass or obstructive stone. Patient able to void without difficulty so will not place foley catheter at this time.   Hemoglobin today is stable. FOBT suspiciously negative though patient has required blood transfusion in past week. Has been seen by GI and prepared for endoscopy on 10/12.  With recurrent bleeding, elevated INR, and ongoing gross hematuria, will plan admission for observation, possible GI consultation to consider expediting endoscopies.  D/w Dr. Velia Meyer.        Final Clinical Impression(s) / ED Diagnoses Final diagnoses:  Hemorrhagic cystitis    Rx / DC  Orders ED Discharge Orders     None         Lauro Manlove, Annie Main, MD 04/12/22 [phone]

## 2022-04-11 NOTE — Telephone Encounter (Signed)
Inbound call from patient son stating patient  is feeling weak . States he would like for patient to have procedure dated for 10/12 right away. Advise him of the next available ov or for him to admit patient into the ER if patient is not wel enough to wait.. Son states patient does not want to go the the ER and the reason the patient feels this way is because he has not had the procedure done. Please give a call to further advise.  Thank you

## 2022-04-11 NOTE — Telephone Encounter (Signed)
Patient canceled, patient went to ED.

## 2022-04-11 NOTE — ED Triage Notes (Signed)
Pt presents with c/o hematuria. Pt reports that it started this morning. Recent kidney removal earlier this year as well as a recent diagnosis of anemia with several blood transfusions.

## 2022-04-11 NOTE — Telephone Encounter (Signed)
FYI  Caller states: -Patient has been having blood in stool with seemingly no cause - Patient has a procedure on Thursday, 10/12 with gastroenterology to trace source of blood loss - Patient has been receiving blood transfusions as need due to low hemoglobin  - Last transfusion 09/27, currently feeling weak - Patient has blood recheck on 10/09 but if hemoglobin too low he can't have procedure on 10/12  Caller requests: -Some advice on what he could do so patient can get procedure completed on 10/12 since they have waited long enough for this procdure.   I was able to get patient scheduled with Dr. Randol Kern on 10/05 @ 4:20pm due to PCP being out of office.

## 2022-04-11 NOTE — ED Notes (Signed)
Son has gone home. Took all of pt's belongings with him. Pt is resting comfortably.

## 2022-04-11 NOTE — ED Provider Triage Note (Signed)
Emergency Medicine Provider Triage Evaluation Note  Daniel Sharp , a 88 y.o. male  was evaluated in triage.  Pt complains of hematuria. Noticed it this morning. Red tinged urine with what looked like blood clot. Endorses urinary incontinence for a few years. Kidney removed d/t renal carcinoma in July of this year. Denies painful urination and malodorous urine. Recent h/o anemia, hg 7.8 2 wks ago. Also endorsing black tarry stools. Scheduled for upper and lower GI scope for Oct 12th. Recent h/o blood transfusion and iron transfusion. [name] thinking he is overall weaker. No CP but is more weak and light headed. Also SOB but this has been an on going issue for the last year.   Afib, and mitral valve repair 18 yrs ago on warfarin  Review of Systems  Positive: See above Negative: See above  Physical Exam  BP 126/61 (BP Location: Left Arm)   Pulse 75   Temp 99 F (37.2 C) (Oral)   Resp 18   Ht 6' 0.5" (1.842 m)   Wt 87.1 kg   SpO2 90%   BMI 25.68 kg/m  Gen:   Awake, no distress   Resp:  Normal effort  MSK:   Moves extremities without difficulty  Other:    Medical Decision Making  Medically screening exam initiated at 1:51 PM.  Appropriate orders placed.  [name] was informed that the remainder of the evaluation will be completed by another provider, this initial triage assessment does not replace that evaluation, and the importance of remaining in the ED until their evaluation is complete.  Cmp, cbc, protime-inr, T&S, UA    Harriet Pho, PA-C 04/11/22 1404

## 2022-04-11 NOTE — Telephone Encounter (Signed)
Returned call to patient's son [name], we spoke at length. He reports that patient had labs on 04/08/22 and pt's Hgb was 7.8. Pt received transfusion on 10/3 but he is getting weaker. Pt passed dark stools yesterday. [name] states that patient started holding his Coumadin today, I asked [name] who told patient to hold the medication and he stated that he did. I told him that I can't advise him to hold Coumadin and Cardiology would have to give that approval. He wanted to know "why the colonoscopy couldn't be moved up even though we know the patient needs the procedure and is bleeding internally". I told [name] that we had to have approval to hold Coumadin for 5 days before we could do the procedure and we need to make sure his blood counts are stable, he states that pt is supposed to have repeat labs on 10/9. I told him that if patients Hgb is not stable he will need another transfusion. I told [name] several times that if patient was not feeling any better or if he is experiencing SOB, dizziness, or fatigue the best place for him is the ED that way they can monitor labs closely and do colonoscopy sooner if necessary. [name] states that he called patient's PCP but she is out of the office. They plan to see the covering provider this afternoon and they may be able to get patient scheduled for an inpatient colonoscopy, I told [name] that inpatient procedures do not work that way. I told [name] that the patient will have to be evaluated in the ED first and then the colonoscopy could be performed if necessary. I asked [name] if he was opposed to taking the patient to the ED, he stated that he "didn't understand why he has to wait in the ED to be seen to be told what we already know". I told him unfortunately that is just how the process is. [name] states that he will keep pt's appt this afternoon at the PCP's office and will discuss the same things that he has told us and see what they say. I again recommended he take the patient to the ED  due to his age, medical history and ongoing symptoms. Dirk asked me why I called him and asked if I called him because Medicare called our office. I explained to [name] that I called because of his concerns for the patient and I am the nurse. He states that he spoke to someone before but he thinks she was the scheduler. I confirmed that he did previously speak with the receptionist but again, I am the nurse and returned call to address concerns for the patient. Dirk then said OK and we ended the call.

## 2022-04-12 ENCOUNTER — Telehealth: Payer: Self-pay

## 2022-04-12 ENCOUNTER — Other Ambulatory Visit: Payer: Self-pay

## 2022-04-12 ENCOUNTER — Encounter (HOSPITAL_COMMUNITY): Payer: Self-pay | Admitting: Internal Medicine

## 2022-04-12 DIAGNOSIS — G9341 Metabolic encephalopathy: Secondary | ICD-10-CM | POA: Diagnosis not present

## 2022-04-12 DIAGNOSIS — C20 Malignant neoplasm of rectum: Secondary | ICD-10-CM | POA: Diagnosis present

## 2022-04-12 DIAGNOSIS — K922 Gastrointestinal hemorrhage, unspecified: Secondary | ICD-10-CM | POA: Diagnosis not present

## 2022-04-12 DIAGNOSIS — D128 Benign neoplasm of rectum: Secondary | ICD-10-CM | POA: Diagnosis not present

## 2022-04-12 DIAGNOSIS — E441 Mild protein-calorie malnutrition: Secondary | ICD-10-CM | POA: Diagnosis not present

## 2022-04-12 DIAGNOSIS — K449 Diaphragmatic hernia without obstruction or gangrene: Secondary | ICD-10-CM | POA: Diagnosis not present

## 2022-04-12 DIAGNOSIS — E860 Dehydration: Secondary | ICD-10-CM | POA: Insufficient documentation

## 2022-04-12 DIAGNOSIS — D62 Acute posthemorrhagic anemia: Secondary | ICD-10-CM | POA: Diagnosis not present

## 2022-04-12 DIAGNOSIS — R933 Abnormal findings on diagnostic imaging of other parts of digestive tract: Secondary | ICD-10-CM

## 2022-04-12 DIAGNOSIS — G7000 Myasthenia gravis without (acute) exacerbation: Secondary | ICD-10-CM | POA: Diagnosis present

## 2022-04-12 DIAGNOSIS — N3091 Cystitis, unspecified with hematuria: Secondary | ICD-10-CM | POA: Diagnosis present

## 2022-04-12 DIAGNOSIS — K222 Esophageal obstruction: Secondary | ICD-10-CM | POA: Diagnosis not present

## 2022-04-12 DIAGNOSIS — R3129 Other microscopic hematuria: Secondary | ICD-10-CM | POA: Insufficient documentation

## 2022-04-12 DIAGNOSIS — K5731 Diverticulosis of large intestine without perforation or abscess with bleeding: Secondary | ICD-10-CM | POA: Diagnosis not present

## 2022-04-12 DIAGNOSIS — N1831 Chronic kidney disease, stage 3a: Secondary | ICD-10-CM | POA: Diagnosis not present

## 2022-04-12 DIAGNOSIS — D631 Anemia in chronic kidney disease: Secondary | ICD-10-CM | POA: Diagnosis present

## 2022-04-12 DIAGNOSIS — I130 Hypertensive heart and chronic kidney disease with heart failure and stage 1 through stage 4 chronic kidney disease, or unspecified chronic kidney disease: Secondary | ICD-10-CM | POA: Diagnosis present

## 2022-04-12 DIAGNOSIS — K6289 Other specified diseases of anus and rectum: Secondary | ICD-10-CM | POA: Diagnosis not present

## 2022-04-12 DIAGNOSIS — I428 Other cardiomyopathies: Secondary | ICD-10-CM | POA: Diagnosis not present

## 2022-04-12 DIAGNOSIS — N400 Enlarged prostate without lower urinary tract symptoms: Secondary | ICD-10-CM | POA: Diagnosis not present

## 2022-04-12 DIAGNOSIS — K3189 Other diseases of stomach and duodenum: Secondary | ICD-10-CM | POA: Diagnosis not present

## 2022-04-12 DIAGNOSIS — R310 Gross hematuria: Secondary | ICD-10-CM

## 2022-04-12 DIAGNOSIS — F05 Delirium due to known physiological condition: Secondary | ICD-10-CM | POA: Diagnosis not present

## 2022-04-12 DIAGNOSIS — I2781 Cor pulmonale (chronic): Secondary | ICD-10-CM | POA: Diagnosis present

## 2022-04-12 DIAGNOSIS — N189 Chronic kidney disease, unspecified: Secondary | ICD-10-CM | POA: Diagnosis not present

## 2022-04-12 DIAGNOSIS — I10 Essential (primary) hypertension: Secondary | ICD-10-CM | POA: Diagnosis not present

## 2022-04-12 DIAGNOSIS — I609 Nontraumatic subarachnoid hemorrhage, unspecified: Secondary | ICD-10-CM | POA: Diagnosis not present

## 2022-04-12 DIAGNOSIS — L89156 Pressure-induced deep tissue damage of sacral region: Secondary | ICD-10-CM | POA: Diagnosis not present

## 2022-04-12 DIAGNOSIS — D509 Iron deficiency anemia, unspecified: Secondary | ICD-10-CM | POA: Diagnosis not present

## 2022-04-12 DIAGNOSIS — D124 Benign neoplasm of descending colon: Secondary | ICD-10-CM | POA: Diagnosis not present

## 2022-04-12 DIAGNOSIS — G9340 Encephalopathy, unspecified: Secondary | ICD-10-CM | POA: Diagnosis not present

## 2022-04-12 DIAGNOSIS — R609 Edema, unspecified: Secondary | ICD-10-CM | POA: Diagnosis not present

## 2022-04-12 DIAGNOSIS — J9621 Acute and chronic respiratory failure with hypoxia: Secondary | ICD-10-CM | POA: Diagnosis not present

## 2022-04-12 DIAGNOSIS — A419 Sepsis, unspecified organism: Secondary | ICD-10-CM | POA: Diagnosis not present

## 2022-04-12 DIAGNOSIS — C642 Malignant neoplasm of left kidney, except renal pelvis: Secondary | ICD-10-CM | POA: Diagnosis not present

## 2022-04-12 DIAGNOSIS — D122 Benign neoplasm of ascending colon: Secondary | ICD-10-CM | POA: Diagnosis not present

## 2022-04-12 DIAGNOSIS — Z66 Do not resuscitate: Secondary | ICD-10-CM | POA: Diagnosis not present

## 2022-04-12 DIAGNOSIS — N1832 Chronic kidney disease, stage 3b: Secondary | ICD-10-CM | POA: Diagnosis present

## 2022-04-12 DIAGNOSIS — B952 Enterococcus as the cause of diseases classified elsewhere: Secondary | ICD-10-CM | POA: Diagnosis not present

## 2022-04-12 DIAGNOSIS — I4819 Other persistent atrial fibrillation: Secondary | ICD-10-CM | POA: Diagnosis present

## 2022-04-12 DIAGNOSIS — Z86010 Personal history of colonic polyps: Secondary | ICD-10-CM | POA: Diagnosis not present

## 2022-04-12 DIAGNOSIS — Z7901 Long term (current) use of anticoagulants: Secondary | ICD-10-CM | POA: Diagnosis not present

## 2022-04-12 DIAGNOSIS — N19 Unspecified kidney failure: Secondary | ICD-10-CM | POA: Diagnosis not present

## 2022-04-12 DIAGNOSIS — J9601 Acute respiratory failure with hypoxia: Secondary | ICD-10-CM | POA: Diagnosis not present

## 2022-04-12 DIAGNOSIS — J189 Pneumonia, unspecified organism: Secondary | ICD-10-CM | POA: Diagnosis present

## 2022-04-12 DIAGNOSIS — J9622 Acute and chronic respiratory failure with hypercapnia: Secondary | ICD-10-CM | POA: Diagnosis not present

## 2022-04-12 DIAGNOSIS — E854 Organ-limited amyloidosis: Secondary | ICD-10-CM | POA: Diagnosis present

## 2022-04-12 DIAGNOSIS — E874 Mixed disorder of acid-base balance: Secondary | ICD-10-CM | POA: Diagnosis not present

## 2022-04-12 DIAGNOSIS — K921 Melena: Secondary | ICD-10-CM | POA: Diagnosis not present

## 2022-04-12 DIAGNOSIS — I5033 Acute on chronic diastolic (congestive) heart failure: Secondary | ICD-10-CM | POA: Diagnosis not present

## 2022-04-12 DIAGNOSIS — J9602 Acute respiratory failure with hypercapnia: Secondary | ICD-10-CM | POA: Diagnosis not present

## 2022-04-12 DIAGNOSIS — R7881 Bacteremia: Secondary | ICD-10-CM | POA: Diagnosis not present

## 2022-04-12 DIAGNOSIS — I5043 Acute on chronic combined systolic (congestive) and diastolic (congestive) heart failure: Secondary | ICD-10-CM | POA: Diagnosis not present

## 2022-04-12 DIAGNOSIS — N179 Acute kidney failure, unspecified: Secondary | ICD-10-CM | POA: Diagnosis present

## 2022-04-12 DIAGNOSIS — Z952 Presence of prosthetic heart valve: Secondary | ICD-10-CM | POA: Diagnosis not present

## 2022-04-12 LAB — CBC WITH DIFFERENTIAL/PLATELET
Abs Immature Granulocytes: 0.05 10*3/uL (ref 0.00–0.07)
Basophils Absolute: 0 10*3/uL (ref 0.0–0.1)
Basophils Relative: 0 %
Eosinophils Absolute: 0.1 10*3/uL (ref 0.0–0.5)
Eosinophils Relative: 1 %
HCT: 27.9 % — ABNORMAL LOW (ref 39.0–52.0)
Hemoglobin: 8.7 g/dL — ABNORMAL LOW (ref 13.0–17.0)
Immature Granulocytes: 1 %
Lymphocytes Relative: 10 %
Lymphs Abs: 1 10*3/uL (ref 0.7–4.0)
MCH: 29.7 pg (ref 26.0–34.0)
MCHC: 31.2 g/dL (ref 30.0–36.0)
MCV: 95.2 fL (ref 80.0–100.0)
Monocytes Absolute: 0.7 10*3/uL (ref 0.1–1.0)
Monocytes Relative: 7 %
Neutro Abs: 8.3 10*3/uL — ABNORMAL HIGH (ref 1.7–7.7)
Neutrophils Relative %: 81 %
Platelets: 202 10*3/uL (ref 150–400)
RBC: 2.93 MIL/uL — ABNORMAL LOW (ref 4.22–5.81)
RDW: 15.2 % (ref 11.5–15.5)
WBC: 10.1 10*3/uL (ref 4.0–10.5)
nRBC: 0 % (ref 0.0–0.2)

## 2022-04-12 LAB — COMPREHENSIVE METABOLIC PANEL
ALT: 18 U/L (ref 0–44)
AST: 17 U/L (ref 15–41)
Albumin: 2.5 g/dL — ABNORMAL LOW (ref 3.5–5.0)
Alkaline Phosphatase: 62 U/L (ref 38–126)
Anion gap: 7 (ref 5–15)
BUN: 42 mg/dL — ABNORMAL HIGH (ref 8–23)
CO2: 28 mmol/L (ref 22–32)
Calcium: 10.9 mg/dL — ABNORMAL HIGH (ref 8.9–10.3)
Chloride: 105 mmol/L (ref 98–111)
Creatinine, Ser: 1.24 mg/dL (ref 0.61–1.24)
GFR, Estimated: 56 mL/min — ABNORMAL LOW (ref >60)
Glucose, Bld: 90 mg/dL (ref 70–99)
Potassium: 4.1 mmol/L (ref 3.5–5.1)
Sodium: 140 mmol/L (ref 135–145)
Total Bilirubin: 0.9 mg/dL (ref 0.3–1.2)
Total Protein: 6 g/dL — ABNORMAL LOW (ref 6.5–8.1)

## 2022-04-12 LAB — IRON AND TIBC
Iron: 16 ug/dL — ABNORMAL LOW (ref 45–182)
Saturation Ratios: 10 % — ABNORMAL LOW (ref 17.9–39.5)
TIBC: 168 ug/dL — ABNORMAL LOW (ref 250–450)
UIBC: 152 ug/dL

## 2022-04-12 LAB — PROTIME-INR
INR: 3 — ABNORMAL HIGH (ref 0.8–1.2)
Prothrombin Time: 30.9 seconds — ABNORMAL HIGH (ref 11.4–15.2)

## 2022-04-12 LAB — URINE CULTURE: Culture: NO GROWTH

## 2022-04-12 LAB — PHOSPHORUS: Phosphorus: 3 mg/dL (ref 2.5–4.6)

## 2022-04-12 LAB — MAGNESIUM
Magnesium: 2.1 mg/dL (ref 1.7–2.4)
Magnesium: 2 mg/dL (ref 1.7–2.4)

## 2022-04-12 LAB — FERRITIN: Ferritin: 757 ng/mL — ABNORMAL HIGH (ref 24–336)

## 2022-04-12 MED ORDER — FERROUS SULFATE 325 (65 FE) MG PO TABS
325.0000 mg | ORAL_TABLET | Freq: Every day | ORAL | Status: DC
  Administered 2022-04-12: 325 mg via ORAL
  Filled 2022-04-12: qty 1

## 2022-04-12 MED ORDER — PREDNISONE 5 MG PO TABS
10.0000 mg | ORAL_TABLET | Freq: Every day | ORAL | Status: DC
  Administered 2022-04-12 – 2022-04-30 (×19): 10 mg via ORAL
  Filled 2022-04-12: qty 1
  Filled 2022-04-12: qty 2
  Filled 2022-04-12: qty 1
  Filled 2022-04-12 (×2): qty 2
  Filled 2022-04-12 (×3): qty 1
  Filled 2022-04-12: qty 2
  Filled 2022-04-12: qty 1
  Filled 2022-04-12 (×2): qty 2
  Filled 2022-04-12: qty 1
  Filled 2022-04-12 (×2): qty 2
  Filled 2022-04-12: qty 1
  Filled 2022-04-12: qty 2
  Filled 2022-04-12: qty 1
  Filled 2022-04-12: qty 2

## 2022-04-12 MED ORDER — PANTOPRAZOLE SODIUM 40 MG IV SOLR
40.0000 mg | Freq: Two times a day (BID) | INTRAVENOUS | Status: DC
  Administered 2022-04-12 – 2022-04-20 (×17): 40 mg via INTRAVENOUS
  Filled 2022-04-12 (×17): qty 10

## 2022-04-12 MED ORDER — TAMSULOSIN HCL 0.4 MG PO CAPS
0.4000 mg | ORAL_CAPSULE | Freq: Every day | ORAL | Status: DC
  Administered 2022-04-12 – 2022-04-30 (×19): 0.4 mg via ORAL
  Filled 2022-04-12 (×19): qty 1

## 2022-04-12 MED ORDER — PYRIDOSTIGMINE BROMIDE 60 MG PO TABS
60.0000 mg | ORAL_TABLET | Freq: Three times a day (TID) | ORAL | Status: DC
  Administered 2022-04-12 – 2022-04-15 (×10): 60 mg via ORAL
  Filled 2022-04-12 (×13): qty 1

## 2022-04-12 NOTE — Telephone Encounter (Signed)
Noted  

## 2022-04-12 NOTE — Progress Notes (Signed)
PROGRESS NOTE    Daniel Sharp  OQH:[id number] DOB: [birth year] DOA: 04/11/2022 PCP: [name], MD   Brief Narrative:  HPI per Dr. Babs Bertin on 04/11/22 Daniel Sharp is a 88 y.o. 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    Following history is provided by the patient as well as the patient's son who is present at bedside, in addition to my discussions with the EDP and via chart review.   For the last few months, the patient has been experiencing recurrent dark-colored stool for which she has been evaluated by [facility] gastroenterology and is scheduled for EGD/colonoscopy with Dr. Tarri Glenn As an outpatient on April 18, 2022.  He also has a history of chronic iron deficiency anemia associated baseline hemoglobin 8-10, and has been requiring recurrent blood transfusions as an outpatient.  Specifically, he has required transfusion of 3 units PRBC over the last 9 days, with most recent transfusion occurring on 04-08-22.  In the setting of a history of paroxysmal atrial fibrillation he is chronically anticoagulated on warfarin, noting no additional blood thinners as an outpatient, including no concomitant aspirin use.  In this context, the patient reports that he experienced another episode of dark-colored stool yesterday, in the absence of any bright red blood per rectum.  He is on daily oral iron supplementation as an outpatient.  Denies any associated chest pain,  shortness of breath, palpitations, diaphoresis, nausea, vomiting, dizziness, presyncope, or syncope.  No hemoptysis.  No recent trauma.   Patient's medical history is also notable for kidney cancer status post left nephrectomy in July 2023 with Dr. Lovena Neighbours Of urology at [facility]   Yesterday, the patient noted new onset gross hematuria, stating that he noted some bright red blood in his diaper, which he believed was urinary in source.  He reports associated 1 to 2 days of suprapubic discomfort in the absence of dysuria or change in urinary urgency/frequency.  Denies any recent subjective fever, chills, rigors, or generalized myalgias.  No recent cough.   Urinary history also notable for BPH for which he is on Flomax at home.  He has a history of stage IIIa CKD 6 with baseline creatinine 1.3-1.6, most recent prior serum creatinine data point noted to be 1.33 on 03/27/2022.         ED Course:  Vital signs in the ED were notable for the following: Digamex 99.0; heart rate initially 92, which subsequent decreased into the 80s following interval IV fluids; systolic blood pressures in the 120s to 130s; respiratory rate 18-23; oxygen saturation 94 to 96% on room air.   Labs were notable for the following: CMP notable for the following: Bicarbonate 29, BUN 46 compared to 32 on 03/27/2022, creatinine 1.40, calcium, adjusted for mild hypoalbuminemia noted to be 11.8 relative to most recent prior adjusted calcium level of 11.3 on 03/27/2022, otherwise liver enzymes within normal limits.  High-sensitivity troponin I initially 29, 3 cefatrizine 1025.  CBC notable for white cell count 12,600 with 87% neutrophils, hemoglobin 9.2 associated normocytic/normochromic properties as well as nonelevated RDW, relative to most recent prior hemoglobin value  of 7.8 on 10-23, platelet count 239.  INR 3.2.  Lactic acid 1.4.  Urinalysis associated with a clear yellow sample and notable for 11-20 white blood cells as well as greater  than 50 RBCs.  DRE performed by EDP showed brown-colored stool that was fecal occult blood negative.  Urine culture collected prior to initiation of IV antibiotics.   Imaging and additional notable ED work-up: EKG showed atrial fibrillation with multiple PVCs, rate 89, nonspecific T wave inversion in aVL, no evidence of ST changes, including no evidence of ST elevation.  CT abdomen/pelvis without contrast showed no evidence of acute intra-abdominal or acute intrapelvic process, while showing interval left nephrectomy as well as descending sigmoid and colonic diverticulosis in the absence of evidence of diverticulitis obstruction, perforation, or abscess.  Additionally, CT abdomen/pelvis showed marked prostatic enlargement, as well as suggestion of right will mass.   While in the ED, the following were administered: Protonix 40 mg IV x1, Rocephin, normal symptoms with interval loss.   Subsequently, the patient was admitted for further evaluation management microscopic hematuria in the setting is suspected hemorrhagic cystitis complicated by sepsis, with presentation also notable for clinical suspicion for dehydration.    **Interim History Gastroenterology was consulted for his normocytic anemia and recent FOBT positive and they are recommending holding iron for upcoming bowel prep and scheduling colonoscopy and EGD after warfarin washout when INR is less than 1.7 for elective GI procedures.  Given that he had some hematuria will consider discussing with urology will need to continue monitor his hematuria.   Assessment and Plan:  Gross hematuria -Patient reports new onset gross hematuria over the last day, although urinalysis is associated with a clear yellow appearing specimen with greater than 50 RBCs, suspicious for hemorrhagic cystitis in the setting of significant pyuria concomitant with new onset suprapubic discomfort along with positive SIRS criteria.   -Of note, he is chronically anticoagulated  on warfarin, with mildly supratherapeutic pubic INR presentation, but otherwise no additional blood thinners as an outpatient.  Hemoglobin consistent with baseline.  Appears hemodynamically stable, and otherwise asymptomatic. - Further evaluation management of suspected hemorrhagic cystitis, including continuation of IV Rocephin, as further detailed below.  Continuous lactated Ringer's.  Monitor on symmetry.  Every 4 hour H&H's ordered through 9 AM on 04/12/2022.   -Continue to Hold next dose of warfarin, with repeat INR ordered for the morning.   -Repeat CBC in the morning.  Add on iron studies.  Type and screen ordered.   -May consider discussing with urology versus outpatient follow-up in the context of a history of recent left nephrectomy.  Sepsis due to hemorrhagic cystitis -In the context of new onset suprapubic discomfort, presenting urinalysis with significant pyuria, with SIRS criteria positive via leukocytosis, tachypnea.   -However, no evidence of endorgan damage to meet criteria for severe sepsis, including nonelevated lactic acid, with presenting value of 1.4.   -No evidence of additional underlying infectious process at this time.   -Urine culture collected prior to initiation of Rocephin, which will be continued for now.  Of note, patient received Rocephin prior to collection of blood cultures. -Continuous IV fluids.  Follow-up result of urine culture.  Continue Rocephin.  Repeat CBC differential in the morning.  Other evaluation management of presenting microscopic materia, as above.   Chronic iron deficiency anemia -In the setting of chronic blood loss, including from suspected subacute upper gastrointestinal bleed, patient has a baseline hemoglobin range of 8-10, and has required transfusion of 3 units  PBC over the course the last 9 days.  Ever, presenting hemoglobin found to be within baseline range, trending up from most recent prior value of 7.8 on 04/08/2022.  Given the patient's  reported new onset gross hematuria, will closely monitor considering hemoglobin trend via every 4 hour monitoring as further detailed below.  Hemodynamically stable.  Otherwise asymptomatic.   -Anemia panel was done and showed an iron level of 16, U IBC 152, TIBC of 168, saturation ratios of 10%, ferritin level 757 -Every 4 hour H&H's ordered through 9 AM on 04/12/2022.  Hold this evening's dose of warfarin, with repeat INR ordered for the morning.  Add on iron studies.  Monitor on symmetry.  Resume home oral iron via p.o. route.  Further evaluation management of suspected subacute upper GI bleed, as further detailed below.  Further evaluation management of suspected hemorrhagic cystitis, as above. -Hemoglobin/hematocrit went from 9.2/30.3 is now 8.7/27.9 -GI consulted and planning for an EGD and colonoscopy once warfarin is washed out and INR is less than 1.7   Subacute upper GIB -Suspected on the basis of multiple months of intermittent dark-colored stool, with elevated BUN for this, the patient is established as an outpatient with [place] gastroenterology, and is scheduled for EGD/colonoscopy with Dr. Tarri Glenn On 04/18/2022, with corresponding instructions leading up to this endoscopic evaluation that include holding home warfarin for 5 days leading up to the scopes.   -Appears to be a slow and/or intermittent upper gastrointestinal bleed given the dark coloration of the patient's stool.  However, -DRE performed by EDP today revealed brown-colored stool is fecal occult blood negative this admission.   -Of note, CT abdomen/pelvis reveals evidence suggestive of rectal mass, warranting further evaluation, which is planned via colonoscopy scheduled with [facility] gastroenterology on 04/18/2022, as above.  Does not appear to be in overt indication for PRBC transfusion at this time, but will closely monitor ensuing hemoglobin trend as outlined below.   -Chronic prednisone use in the setting of ocular myasthenia  gravis noted, with associated increased risk for development of pressure ulcer. -Continue Every 4 hour H&H's through 9 AM on 04/12/2022.  Type and screen ordered.  Repeat INR in the morning after holding this evening's dose of warfarin.  Monitor on symmetry.  Patient colonoscopy/EGD with [facility] gastroenterology as scheduled for April 18, 2022 but consulted GI while hospitalized and this will be done while in the hospital now   Hypercalcemia -Presenting adjusted calcium level of 11.8, just slightly higher than most recent prior adjusted value of 11.3 on 03/27/2022. -Suspect contribution from dehydration given clinical appearance of such.   -No overt additional pharmacologic contribution as an outpatient. -We will provide interval IV fluids to attend to suspected element of dehydration, with consideration for further expansion of evaluation if serum calcium level remains elevated following interval IV fluid resuscitative efforts. -IV fluids, as above.  Monitor strict I's and O's and daily weights.  Repeat CMP in the morning.     Dehydration -Clinical suspicion for such, including the appearance of dry oral mucous membranes as well as laboratory findings notable for acute prerenal azotemia.  -Appears to be in the setting of   relative intravascular dilation and stemming from sepsis due to hemorrhagic cystitis superimposed on chronic blood loss anemia, and exacerbated the patient Lasix, which will be held in the context of chronic right-sided systolic heart failure with increased risk for preload dependent pathophysiology.  -Monitor strict I's and O's.  Daily weights.  Repeat BMP in the morning.  Gentle IV fluids overnight, as above.  Holding home Lasix for now.   Paroxysmal atrial fibrillation -Documented history of such. In setting of CHA2DS2-VASc score of 4, there is an indication for chronic anticoagulation for thromboembolic prophylaxis. Consistent with this, patient is chronically anticoagulated on  warfarin, with presenting INR noted to be mildly supratherapeutic.   -In the context subacute upper GI bleed as well as report of new onset gross hematuria, will hold this evening's dose of warfarin, and repeat INR in the morning.  No clinical indication for pharmacologic reversal at this time.. Home AV nodal blocking regimen: None.  Most recent echocardiogram August 2023, with results as further detailed above.  Presenting EKG suggestive rate controlled atrial fibrillation. -Holding home warfarin dose this evening, with plan to repeat INR in the morning.  Monitor on telemetry.   Chronic right-sided systolic heart failure -Documented as such, with most recent echocardiogram on 02/14/2022 showing evidence of LVEF 55%, indeterminate diastolic parameters, as well as mildly reduced right ventricular systolic function.  Patient on Lasix 20 mg p.o. daily as an outpatient.  Given increased risk for preload dependent pathophysiology, and given increased risk for intravascular depletion as a consequence of chronic blood loss anemia, will hold home Lasix for now, and closely monitor ensuing hemoglobin, as further outlined above, will provide gentle IV fluids as a component of intravascular repletion given appearance of dehydration. -Hold home Lasix.  Monitor strict I's and O's and daily weights.  At [facility] magnesium low-grade repeat CMP in the morning.   -Continue Gentle IV fluids, as above. -Continue to monitor for signs and symptoms of volume overload   Essential hypertension -Documented history of such, on Norvasc, Lasix, as an outpatient will also noting home Flomax, with potential antihypertensive implications.   -Normotensive in the ED this evening. -In context of presenting sepsis as well as chronic blood loss, will hold him Norvasc as well as Lasix for now.  Close very resume blood pressure via routine vital signs.  Monitor strict I's and O's Daily weights. -Continue monitor blood pressures per protocol    Ocular myasthenia's gravis -Document history of such, following with neurology as an outpatient, on chronic prednisone therapy as well as Mestinon.  Inclusion of prednisone notable in the context suspected subacute upper distress of bleed, which increases risk for associated pressure ulcer. -Continue prednisone and Mestinon therapy.   Stage IIIa CKD -Documented history of such, associated baseline creatinine range 1.3-1.6, with contribution from status post left nephrectomy in July 2023.  Presenting serum creatinine found to be within baseline range. -Monitor strict I's and O's Daily weights.  Tempt avoid nephrotoxic agents.  Repeat CMP in the morning.  Add on serum magnesium level.  Continue home Flomax.   Hypoalbuminemia -Albumin level is now 2.5 and down from 3.0 -Continue to monitor and trend and repeat CMP in a.m.  DVT prophylaxis: SCDs Start: 04/11/22 2325    Code Status: Full Code Family Communication: Discussed with the son at bedside  Disposition Plan:  Level of care: Progressive Status is: Inpatient Remains inpatient appropriate because: Needs further work-up for bleeding and GI has been consulted and he will be going under an EGD and colonoscopy once his INR is below 1.7   Consultants:  Gastroenterology  Procedures:  None  Antimicrobials:  Anti-infectives (From admission, onward)    Start     Dose/Rate Route Frequency Ordered Stop   04/12/22 1800  cefTRIAXone (ROCEPHIN) 1 g in sodium chloride 0.9 % 100 mL IVPB  1 g 200 mL/hr over 30 Minutes Intravenous Every 24 hours 04/11/22 2329     04/11/22 2300  cefTRIAXone (ROCEPHIN) 1 g in sodium chloride 0.9 % 100 mL IVPB        1 g 200 mL/hr over 30 Minutes Intravenous  Once 04/11/22 2256 04/11/22 2350       Subjective: Seen and examined at bedside and he was doing okay.  Denies chest pain or shortness of breath.  Continues to have some black stools and had some bloody urine yesterday.  No nausea or vomiting.   Denies any lightheadedness or dizziness.  Objective: Vitals:   04/12/22 1200 04/12/22 1400 04/12/22 1501 04/12/22 1726  BP: 132/76 122/67  (!) 146/101  Pulse: 70 79  79  Resp: (!) 25 (!) 27  16  Temp:   97.8 F (36.6 C) 97.9 F (36.6 C)  TempSrc:   Oral   SpO2: 97% 98%  98%  Weight:      Height:       No intake or output data in the 24 hours ending 04/12/22 1934 Filed Weights   04/11/22 1319  Weight: 87.1 kg   Examination: Physical Exam:  Constitutional: Elderly overweight Caucasian male currently no acute distress Respiratory: Diminished to auscultation bilaterally, no wheezing, rales, rhonchi or crackles. Normal respiratory effort and patient is not tachypenic. No accessory muscle use.  Unlabored breathing Cardiovascular: RRR, no murmurs / rubs / gallops. S1 and S2 auscultated.  Abdomen: Soft, non-tender, distended secondary to habitus. Bowel sounds positive.  GU: Deferred. Musculoskeletal: No clubbing / cyanosis of digits/nails. No joint deformity upper and lower extremities.  Skin: No rashes, lesions, ulcers. No induration; Warm and dry.  Neurologic: CN 2-12 grossly intact with no focal deficits. Romberg sign and cerebellar reflexes not assessed.  Psychiatric: Normal judgment and insight. Alert and oriented x 3. Normal mood and appropriate affect.   Data Reviewed: I have personally reviewed following labs and imaging studies  CBC: Recent Labs  Lab 04/08/22 1036 04/11/22 1411 04/12/22 0504  WBC 9.1 12.6* 10.1  NEUTROABS 7.3 11.1* 8.3*  HGB 7.8 Repeated and verified X2.* 9.2* 8.7*  HCT 23.5* 30.3* 27.9*  MCV 87.2 95.3 95.2  PLT 238.0 239 628   Basic Metabolic Panel: Recent Labs  Lab 04/11/22 1411 04/11/22 2222 04/12/22 0504  NA 139  --  140  K 4.5  --  4.1  CL 104  --  105  CO2 29  --  28  GLUCOSE 110*  --  90  BUN 46*  --  42*  CREATININE 1.40*  --  1.24  CALCIUM 11.0*  --  10.9*  MG  --  2.1 2.0  PHOS  --   --  3.0   GFR: Estimated Creatinine  Clearance: 46.8 mL/min (by C-G formula based on SCr of 1.24 mg/dL). Liver Function Tests: Recent Labs  Lab 04/11/22 1411 04/12/22 0504  AST 20 17  ALT 21 18  ALKPHOS 69 62  BILITOT 1.1 0.9  PROT 6.7 6.0*  ALBUMIN 3.0* 2.5*   No results for input(s): "LIPASE", "AMYLASE" in the last 168 hours. No results for input(s): "AMMONIA" in the last 168 hours. Coagulation Profile: Recent Labs  Lab 04/11/22 1411 04/12/22 0504  INR 3.2* 3.0*   Cardiac Enzymes: No results for input(s): "CKTOTAL", "CKMB", "CKMBINDEX", "TROPONINI" in the last 168 hours. BNP (last 3 results) No results for input(s): "PROBNP" in the last 8760 hours. HbA1C: No results for input(s): "HGBA1C" in the last 72 hours.  CBG: No results for input(s): "GLUCAP" in the last 168 hours. Lipid Profile: No results for input(s): "CHOL", "HDL", "LDLCALC", "TRIG", "CHOLHDL", "LDLDIRECT" in the last 72 hours. Thyroid Function Tests: No results for input(s): "TSH", "T4TOTAL", "FREET4", "T3FREE", "THYROIDAB" in the last 72 hours. Anemia Panel: Recent Labs    04/12/22 0926  FERRITIN 757*  TIBC 168*  IRON 16*   Sepsis Labs: Recent Labs  Lab 04/11/22 2022  LATICACIDVEN 1.4    Recent Results (from the past 240 hour(s))  Urine Culture     Status: None   Collection Time: 04/11/22  2:24 PM   Specimen: Urine, Clean Catch  Result Value Ref Range Status   Specimen Description   Final    URINE, CLEAN CATCH Performed at [facility], [place] [address], [place], [place] [address]    Special Requests   Final    NONE Performed at [facility], [place] [address] [place], [place] [address]    Culture   Final    NO GROWTH Performed at [facility], [place] [address] [place], [place] [address]    Report Status 04/12/2022 FINAL  Final    Radiology Studies: CT ABDOMEN PELVIS WO CONTRAST  Result Date: 04/11/2022 CLINICAL DATA:  Abdominal pain, acute, nonlocalized, hematuria. EXAM: CT  ABDOMEN AND PELVIS WITHOUT CONTRAST TECHNIQUE: Multidetector CT imaging of the abdomen and pelvis was performed following the standard protocol without IV contrast. RADIATION DOSE REDUCTION: This exam was performed according to the departmental dose-optimization program which includes automated exposure control, adjustment of the mA and/or kV according to patient size and/or use of iterative reconstruction technique. COMPARISON:  None Available. FINDINGS: Lower chest: Small right pleural effusion, new since prior examination. Moderate coronary artery calcification. Mitral valve replacement has been performed. Cardiac size is at the upper limits of normal. Hepatobiliary: No focal liver abnormality is seen. Status post cholecystectomy. No biliary dilatation. Pancreas: Unremarkable Spleen: Unremarkable Adrenals/Urinary Tract: The adrenal glands are unremarkable. Interval left nephrectomy. Residual right kidney is normal in size and position. No hydronephrosis. No intrarenal or ureteral calculi. The bladder is unremarkable. Stomach/Bowel: Moderate sigmoid and descending colonic diverticulosis without superimposed acute inflammatory change. There is polypoid soft tissue within the rectum, not well assessed on this noncontrast examination but persistent since prior examination suspicious for an underlying rectal mass best seen on axial image # 78/2. No evidence of obstruction or focal inflammation. The stomach, small bowel, and large bowel are otherwise unremarkable. Status post appendectomy. Vascular/Lymphatic: Moderate aortoiliac atherosclerotic calcification. No aortic aneurysm. No pathologic adenopathy within the abdomen and pelvis. Reproductive: Marked prostatic enlargement again noted. Other: No abdominal wall hernia. Musculoskeletal: No acute bone abnormality. No lytic or blastic bone lesion. Osseous structures are age-appropriate. IMPRESSION: 1. No acute intra-abdominal pathology identified. No definite  radiographic explanation for the patient's reported symptoms. 2. Interval left nephrectomy. 3. Polypoid soft tissue within the rectum, not well assessed on this noncontrast examination but suspicious for an underlying rectal mass. Correlation with endoscopy is recommended. 4. Moderate descending and sigmoid colonic diverticulosis without superimposed acute inflammatory change. 5. Marked prostatic enlargement. 6. Small right pleural effusion, new since prior examination. Aortic Atherosclerosis (ICD10-I70.0). Electronically Signed   By: Fidela Salisbury M.D.   On: 04/11/2022 22:01     Scheduled Meds:  pantoprazole (PROTONIX) IV  40 mg Intravenous Q12H   predniSONE  10 mg Oral Daily   pyridostigmine  60 mg Oral Q8H   tamsulosin  0.4 mg Oral Daily  Continuous Infusions:  cefTRIAXone (ROCEPHIN)  IV 1 g (04/12/22 1824)    LOS: 0 days   Raiford Noble, DO [facility] Available via Epic secure chat 7am-7pm After these hours, please refer to coverage provider listed on [email or web address] 04/12/2022, 7:34 PM

## 2022-04-12 NOTE — ED Notes (Signed)
Breakfast provided.

## 2022-04-12 NOTE — Telephone Encounter (Signed)
-----   Message from Loralie Champagne, PA-C sent at 04/12/2022  4:06 PM EDT ----- Patient scheduled for colonoscopy and EGD with Dr. Tarri Glenn on 10/12.  He is now in the hospital and is going to have his procedures as an inpatient so can you please cancel his procedures on 10/12?  Thank you,  Jess

## 2022-04-12 NOTE — ED Notes (Signed)
Per [email or web address]@ Main lab- gold top tube needing collection. RN advised. [employer]

## 2022-04-12 NOTE — ED Notes (Signed)
Pts brief changed and external cath placed at this time.

## 2022-04-12 NOTE — Telephone Encounter (Signed)
Procedure cancelled.

## 2022-04-12 NOTE — Consult Note (Addendum)
Referring Provider: Dr. Alfredia Ferguson Primary Care Physician:  [name], MD Primary Gastroenterologist:  Dr. Tarri Glenn  Reason for Consultation: Dark stools and recurrent anemia  HPI: Daniel Sharp is a 88 y.o. 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  Also reported and episode of dark stools yesterday and ongoing weakness.  For the last few months, the patient has been experiencing recurrent dark-colored stool for which he has been evaluated at our office just recently and is scheduled for EGD/colonoscopy with Dr. Tarri Glenn as an outpatient on April 18, 2022.  He also has a history of chronic anemia with baseline hemoglobin 8-10 grams, and has been requiring recurrent blood transfusions as an outpatient.  Specifically, he has required transfusion of 3 units PRBC over the last 9 days, with most recent transfusion occurring on 04-08-22.  In the setting of a history of paroxysmal atrial fibrillation he is chronically anticoagulated on warfarin, noting no additional blood thinners as an outpatient, including no concomitant aspirin use.  In this context, the patient reports that he experienced another episode of dark-colored stool yesterday, in the absence of any bright red blood per rectum.  He is on daily oral iron supplementation as an outpatient.  He has had red blood in his urine and urinalysis shows large amount of hemoglobin with trace leukocytes and rare bacteria.  DRE performed by EDP and showed brown  color stool that was Hemoccult negative on this occasion.  INR 3.0.  Hemoglobin 9.2 g and now down to 8.7 g today.  CT scan of the abdomen and pelvis without contrast yesterday showed polypoid soft tissue within the rectum is not well assessed with noncontrast exam but suspicious for underlying rectal malignancy.   Past Medical History:  Diagnosis Date   A-fib [facility])    history of   Arthritis    Dyspnea    H/O mitral valve repair 07/2003   Hypertension    Ocular myasthenia gravis ([place])    takes pred   Pneumonia     Past Surgical History:  Procedure Laterality Date   APPENDECTOMY     BACK SURGERY     cyst removed L3 and L4   CHOLECYSTECTOMY     FINGER SURGERY Right    R hand   MITRAL VALVE REPAIR     on coumadin   NOSE SURGERY     ROBOTIC ASSITED PARTIAL NEPHRECTOMY Left 01/22/2022   Procedure: XI ROBOTIC ASSITED NEPHRECTOMY;  Surgeon: Ceasar Mons, MD;  Location: [facility];  Service: Urology;  Laterality: Left;    Prior to Admission medications   Medication Sig Start Date End Date Taking? Authorizing Provider  amLODipine (NORVASC) 5 MG tablet Take 1 tablet (5 mg total) by mouth daily. 04/10/22  Yes [name], MD  carboxymethylcellulose (REFRESH PLUS) 0.5 % SOLN 1 drop daily as needed (dry eyes).   Yes [provider]  Cholecalciferol (VITAMIN D3 PO) Take 1 tablet by mouth daily.   Yes [provider]  docusate sodium (COLACE) 100 MG capsule Take 1 capsule (100 mg total) by mouth 2 (two) times daily. Patient taking differently:  Take 100 mg by mouth daily. 01/22/22  Yes Dancy, Estill Bamberg, PA-C  ferrous sulfate 325 (65 FE) MG tablet Take 325 mg by mouth daily with breakfast.   Yes [provider]  furosemide (LASIX) 20 MG tablet Take 20 mg by mouth daily.   Yes [provider]  glucosamine-chondroitin 500-400 MG tablet Take 1 tablet by mouth daily.   Yes [provider]  HYDROcodone-acetaminophen (NORCO/VICODIN) 5-325 MG  tablet Take 1-2 tablets by mouth every 6 (six) hours as needed for moderate pain or severe pain. 02/19/22  Yes Shelly Coss, MD  pantoprazole (PROTONIX) 40 MG tablet Take 1 tablet (40 mg total) by mouth daily. 03/29/22  Yes [name], MD  predniSONE (DELTASONE) 5 MG tablet Take 2 tablets (10 mg total) by mouth daily. Patient taking differently: Take 10 mg by mouth daily. Taking 2 tabs by mouth daily 02/11/22  Yes Patel, Donika K, DO  pyridostigmine (MESTINON) 60 MG tablet Take 1 tablet (60 mg total) by mouth every 8 (eight) hours. 02/19/22  Yes Shelly Coss, MD  tamsulosin (FLOMAX) 0.4 MG CAPS capsule Take 0.4 mg by mouth daily.   Yes [provider]  warfarin (COUMADIN) 5 MG tablet Take 2.5-5 mg by mouth See admin instructions. Take 1 tablet (5 mg)on  Mon. Weds. Thursday, Friday and Sunday. Taking 1/2 tab( 2.5 mg) on Tues & Saturday   Yes [provider]    Current Facility-Administered Medications  Medication Dose Route Frequency Provider Last Rate Last Admin   acetaminophen (TYLENOL) tablet 650 mg  650 mg Oral Q6H PRN Howerter, Justin B, DO       Or   acetaminophen (TYLENOL) suppository 650 mg  650 mg Rectal Q6H PRN Howerter, Justin B, DO       cefTRIAXone (ROCEPHIN) 1 g in sodium chloride 0.9 % 100 mL IVPB  1 g Intravenous Q24H Howerter, Justin B, DO       ferrous sulfate tablet 325 mg  325 mg Oral Q breakfast Howerter, Justin B, DO   325 mg at 04/12/22 1040   pantoprazole (PROTONIX) injection 40 mg  40 mg Intravenous Q12H Howerter, Justin B, DO   40 mg at 04/12/22 0925   predniSONE (DELTASONE) tablet 10 mg  10 mg Oral Daily Howerter, Justin B, DO   10 mg at 04/12/22 1040   pyridostigmine (MESTINON) tablet 60 mg  60 mg Oral Q8H Howerter, Justin B, DO   60 mg at 04/12/22 0943   tamsulosin (FLOMAX) capsule 0.4 mg  0.4 mg Oral Daily Howerter, Justin B, DO   0.4 mg at 04/12/22 1041   Current Outpatient Medications  Medication Sig Dispense Refill   amLODipine (NORVASC)  5 MG tablet Take 1 tablet (5 mg total) by mouth daily. 30 tablet 3   carboxymethylcellulose (REFRESH PLUS) 0.5 % SOLN 1 drop daily as needed (dry eyes).     Cholecalciferol (VITAMIN D3 PO) Take 1 tablet by mouth daily.     docusate sodium (COLACE) 100 MG capsule Take 1 capsule (100 mg total) by mouth 2 (two) times daily. (Patient taking differently: Take 100 mg by mouth daily.)     ferrous sulfate 325 (65 FE) MG tablet Take 325 mg by mouth daily with breakfast.     furosemide (LASIX) 20 MG tablet Take 20 mg by mouth daily.     glucosamine-chondroitin 500-400 MG tablet Take 1 tablet by mouth daily.     HYDROcodone-acetaminophen (NORCO/VICODIN) 5-325 MG tablet Take 1-2 tablets by mouth every 6 (  six) hours as needed for moderate pain or severe pain. 15 tablet 0   pantoprazole (PROTONIX) 40 MG tablet Take 1 tablet (40 mg total) by mouth daily. 30 tablet 0   predniSONE (DELTASONE) 5 MG tablet Take 2 tablets (10 mg total) by mouth daily. (Patient taking differently: Take 10 mg by mouth daily. Taking 2 tabs by mouth daily) 180 tablet 1   pyridostigmine (MESTINON) 60 MG tablet Take 1 tablet (60 mg total) by mouth every 8 (eight) hours.     tamsulosin (FLOMAX) 0.4 MG CAPS capsule Take 0.4 mg by mouth daily.     warfarin (COUMADIN) 5 MG tablet Take 2.5-5 mg by mouth See admin instructions. Take 1 tablet (5 mg)on  Mon. Weds. Thursday, Friday and Sunday. Taking 1/2 tab( 2.5 mg) on Tues & Saturday      Allergies as of 04/11/2022   (No Known Allergies)    Family History  Problem Relation Age of Onset   Other Mother        Gallbladder removed   Stroke Father        intracranial stroke   Stroke Brother    Colon cancer Neg Hx    Rectal cancer Neg Hx     Social History   Socioeconomic History   Marital status: Widowed    Spouse name: Not on file   Number of children: 2   Years of education: Not on file   Highest education level: Not on file  Occupational History   Not on file  Tobacco Use    Smoking status: Former    Types: Cigarettes, Pipe, Cigars   Smokeless tobacco: Former    Types: Chew   Tobacco comments:    Uses nicotine gum   Vaping Use   Vaping Use: Never used  Substance and Sexual Activity   Alcohol use: Yes    Alcohol/week: 7.0 standard drinks of alcohol    Types: 7 Glasses of wine per week    Comment: Drinks wine when sons will let him   Drug use: Never   Sexual activity: Not on file  Other Topics Concern   Not on file  Social History Narrative   Lives in [place], [place].  Son lives in [place]      Right [place]    Lives in a one story home with a basement.    L[facility] at [facility] at this time   Social Determinants of Health   Financial Resource Strain: Not on file  Food Insecurity: Not on file  Transportation Needs: Not on file  Physical Activity: Not on file  Stress: Not on file  Social Connections: Not on file  Intimate Partner Violence: Not on file   Review of Systems: ROS is O/W negative except as mentioned in HPI.   Physical Exam: Vital signs in last 24 hours: Temp:  [98 F (36.7 C)-99 F (37.2 C)] 98.4 F (36.9 C) (10/06 1103) Pulse Rate:  [34-114] 70 (10/06 1200) Resp:  [18-26] 25 (10/06 1200) BP: (125-146)/(61-82) 132/76 (10/06 1200) SpO2:  [90 %-97 %] 97 % (10/06 1200) Weight:  [87.1 kg] 87.1 kg (10/05 1319)   General:  Alert, elderly, pleasant and cooperative in NAD Head:  Normocephalic and atraumatic. Eyes:  Sclera clear, no icterus.  Conjunctiva pink. Ears:  Normal auditory acuity. Mouth:  No deformity or lesions.   Lungs:  Clear throughout to auscultation.  No wheezes, crackles, or rhonchi.  Heart:  Irregularly irregular. Abdomen:  Soft, non-distended.  BS present.  Non-tender.  Rectal:  Deferred.  Heme negative.  Msk:  Symmetrical without gross deformities. Pulses:  Normal pulses noted. Extremities:  Without clubbing or edema. Neurologic:  Alert and oriented x 4;  grossly normal neurologically. Skin:  Intact without  significant lesions or rashes. Psych:  Alert and cooperative. Normal mood and affect.  Lab Results: Recent Labs    04/11/22 1411 04/12/22 0504  WBC 12.6* 10.1  HGB 9.2* 8.7*  HCT 30.3* 27.9*  PLT 239 202   BMET Recent Labs    04/11/22 1411 04/12/22 0504  NA 139 140  K 4.5 4.1  CL 104 105  CO2 29 28  GLUCOSE 110* 90  BUN 46* 42*  CREATININE 1.40* 1.24  CALCIUM 11.0* 10.9*   LFT Recent Labs    04/12/22 0504  PROT 6.0*  ALBUMIN 2.5*  AST 17  ALT 18  ALKPHOS 62  BILITOT 0.9   PT/INR Recent Labs    04/11/22 1411 04/12/22 0504  LABPROT 32.1* 30.9*  INR 3.2* 3.0*   Studies/Results: CT ABDOMEN PELVIS WO CONTRAST  Result Date: 04/11/2022 CLINICAL DATA:  Abdominal pain, acute, nonlocalized, hematuria. EXAM: CT ABDOMEN AND PELVIS WITHOUT CONTRAST TECHNIQUE: Multidetector CT imaging of the abdomen and pelvis was performed following the standard protocol without IV contrast. RADIATION DOSE REDUCTION: This exam was performed according to the departmental dose-optimization program which includes automated exposure control, adjustment of the mA and/or kV according to patient size and/or use of iterative reconstruction technique. COMPARISON:  None Available. FINDINGS: Lower chest: Small right pleural effusion, new since prior examination. Moderate coronary artery calcification. Mitral valve replacement has been performed. Cardiac size is at the upper limits of normal. Hepatobiliary: No focal liver abnormality is seen. Status post cholecystectomy. No biliary dilatation. Pancreas: Unremarkable Spleen: Unremarkable Adrenals/Urinary Tract: The adrenal glands are unremarkable. Interval left nephrectomy. Residual right kidney is normal in size and position. No hydronephrosis. No intrarenal or ureteral calculi. The bladder is unremarkable. Stomach/Bowel: Moderate sigmoid and descending colonic diverticulosis without superimposed acute inflammatory change. There is polypoid soft tissue within  the rectum, not well assessed on this noncontrast examination but persistent since prior examination suspicious for an underlying rectal mass best seen on axial image # 78/2. No evidence of obstruction or focal inflammation. The stomach, small bowel, and large bowel are otherwise unremarkable. Status post appendectomy. Vascular/Lymphatic: Moderate aortoiliac atherosclerotic calcification. No aortic aneurysm. No pathologic adenopathy within the abdomen and pelvis. Reproductive: Marked prostatic enlargement again noted. Other: No abdominal wall hernia. Musculoskeletal: No acute bone abnormality. No lytic or blastic bone lesion. Osseous structures are age-appropriate. IMPRESSION: 1. No acute intra-abdominal pathology identified. No definite radiographic explanation for the patient's reported symptoms. 2. Interval left nephrectomy. 3. Polypoid soft tissue within the rectum, not well assessed on this noncontrast examination but suspicious for an underlying rectal mass. Correlation with endoscopy is recommended. 4. Moderate descending and sigmoid colonic diverticulosis without superimposed acute inflammatory change. 5. Marked prostatic enlargement. 6. Small right pleural effusion, new since prior examination. Aortic Atherosclerosis (ICD10-I70.0). Electronically Signed   By: Fidela Salisbury M.D.   On: 04/11/2022 22:01    IMPRESSION:  *Anemia, this is normocytic and iron studies not totally consistent with iron deficiency.  Suspect that this is multifactorial due to his kidney disease and likely component of GI bleeding.  He is scheduled for EGD and colonoscopy on 10/12.  Here now with complaints of dark stools and weakness.  He is actually heme-negative here today by the EDP.  Hemoglobin 9.2 g after 1  unit packed red blood cells on 10/2. *Coumadin coagulopathy due to atrial fibrillation: INR 3.0 here. *Renal cell carcinoma status post nephrectomy in July 2023.  Having gross hematuria, which is actually what brought him  in. *Abnormal CT scan: CT scan of the abdomen and pelvis without contrast yesterday showed polypoid soft tissue within the rectum is not well assessed with noncontrast exam but suspicious for underlying rectal malignancy.  PLAN: -Will plan for EGD and colonoscopy as an inpatient, but INR needs to be around 1.5 in order to perform this safely.  Hold Coumadin and reverse/let drift if appropriate. Probably Sunday or Monday.  Soft diet today and then clears tomorrow in anticipation for possibly Sunday. -For now trend hemoglobin and transfuse if needed. -Pantoprazole 40 mg IV twice daily.  Laban Emperor. Zehr  04/12/2022, 12:23 PM    Attending Physician Note   I have taken a history, reviewed the chart and examined the patient. I performed a substantive portion of this encounter, including complete performance of at least one of the key components, in conjunction with the APP. I agree with the APP's note, impression and recommendations with my edits. My additional impressions and recommendations are as follows.   Assessment: *Normocytic anemia - multifactorial from chronic disease and blood loss, not clearly iron deficient but could be an additional factor.   *Recent FOBT positive stool, currently FOBT negative.   *Rectal soft tissue mass on CT AP (without contrast).   *Afib on warfarin, INR=3.0 today.  *Hematuria from hemorrhagic cystitis on IV ceftriaxone  Recommendations: *Trend CBC, PT/INR   *Hold PO iron for upcoming bowel prep  *Schedule colonoscopy, EGD after warfarin wash out. INR < 1.7 for elective GI procedures    Lucio Edward, MD [facility] See [facility], [facility], for our on call provider

## 2022-04-12 NOTE — ED Notes (Signed)
Pt drinking water 

## 2022-04-13 DIAGNOSIS — N3091 Cystitis, unspecified with hematuria: Secondary | ICD-10-CM | POA: Diagnosis not present

## 2022-04-13 DIAGNOSIS — R3129 Other microscopic hematuria: Secondary | ICD-10-CM | POA: Diagnosis not present

## 2022-04-13 DIAGNOSIS — E860 Dehydration: Secondary | ICD-10-CM | POA: Diagnosis not present

## 2022-04-13 DIAGNOSIS — N19 Unspecified kidney failure: Secondary | ICD-10-CM | POA: Diagnosis not present

## 2022-04-13 LAB — CBC WITH DIFFERENTIAL/PLATELET
Abs Immature Granulocytes: 0.08 10*3/uL — ABNORMAL HIGH (ref 0.00–0.07)
Basophils Absolute: 0 10*3/uL (ref 0.0–0.1)
Basophils Relative: 0 %
Eosinophils Absolute: 0.1 10*3/uL (ref 0.0–0.5)
Eosinophils Relative: 1 %
HCT: 27.5 % — ABNORMAL LOW (ref 39.0–52.0)
Hemoglobin: 8.2 g/dL — ABNORMAL LOW (ref 13.0–17.0)
Immature Granulocytes: 1 %
Lymphocytes Relative: 11 %
Lymphs Abs: 1.2 10*3/uL (ref 0.7–4.0)
MCH: 28.8 pg (ref 26.0–34.0)
MCHC: 29.8 g/dL — ABNORMAL LOW (ref 30.0–36.0)
MCV: 96.5 fL (ref 80.0–100.0)
Monocytes Absolute: 0.7 10*3/uL (ref 0.1–1.0)
Monocytes Relative: 6 %
Neutro Abs: 8.7 10*3/uL — ABNORMAL HIGH (ref 1.7–7.7)
Neutrophils Relative %: 81 %
Platelets: 205 10*3/uL (ref 150–400)
RBC: 2.85 MIL/uL — ABNORMAL LOW (ref 4.22–5.81)
RDW: 15.3 % (ref 11.5–15.5)
WBC: 10.8 10*3/uL — ABNORMAL HIGH (ref 4.0–10.5)
nRBC: 0 % (ref 0.0–0.2)

## 2022-04-13 LAB — COMPREHENSIVE METABOLIC PANEL
ALT: 20 U/L (ref 0–44)
AST: 17 U/L (ref 15–41)
Albumin: 2.7 g/dL — ABNORMAL LOW (ref 3.5–5.0)
Alkaline Phosphatase: 66 U/L (ref 38–126)
Anion gap: 3 — ABNORMAL LOW (ref 5–15)
BUN: 42 mg/dL — ABNORMAL HIGH (ref 8–23)
CO2: 30 mmol/L (ref 22–32)
Calcium: 10.9 mg/dL — ABNORMAL HIGH (ref 8.9–10.3)
Chloride: 104 mmol/L (ref 98–111)
Creatinine, Ser: 1.47 mg/dL — ABNORMAL HIGH (ref 0.61–1.24)
GFR, Estimated: 46 mL/min — ABNORMAL LOW (ref >60)
Glucose, Bld: 101 mg/dL — ABNORMAL HIGH (ref 70–99)
Potassium: 4.2 mmol/L (ref 3.5–5.1)
Sodium: 137 mmol/L (ref 135–145)
Total Bilirubin: 0.8 mg/dL (ref 0.3–1.2)
Total Protein: 6.3 g/dL — ABNORMAL LOW (ref 6.5–8.1)

## 2022-04-13 LAB — PHOSPHORUS: Phosphorus: 3.3 mg/dL (ref 2.5–4.6)

## 2022-04-13 LAB — MAGNESIUM: Magnesium: 2.3 mg/dL (ref 1.7–2.4)

## 2022-04-13 LAB — PROTIME-INR
INR: 3.1 — ABNORMAL HIGH (ref 0.8–1.2)
Prothrombin Time: 32 seconds — ABNORMAL HIGH (ref 11.4–15.2)

## 2022-04-13 NOTE — Progress Notes (Addendum)
[facility] Subjective: Daniel Sharp is a 88 year old white male with multiple medical problems including chronic anemia, status post left nephrectomy in July 2023 for renal cell carcinoma, paroxysmal atrial fibrillation chronically anticoagulated on warfarin with right heart failure, hypertension ocular myasthenia gravis and stage III chronic kidney disease on chronic prednisone admitted for intermittent dark stools and gross hematuria.  Patient was seen in consultation by Dr. Fuller Plan an EGD and colonoscopy have been delayed as his INR was 3 and his anemia was attributed to his hematuria.  CT scan of the abdomen showed questionable polypoid soft tissue lesion in the rectum.  Patient denies any abdominal pain, nausea or rectal bleeding today.  Hemoglobin is 8.2 g/dL. He was heme-negative on evaluation in the ER.  Objective: Vital signs in last 24 hours: Temp:  [97.4 F (36.3 C)-98.5 F (36.9 C)] 98 F (36.7 C) (10/07 0511) Pulse Rate:  [70-114] 73 (10/07 0511) Resp:  [16-27] 21 (10/07 0511) BP: (115-146)/(55-101) 128/57 (10/07 0511) SpO2:  [90 %-98 %] 98 % (10/07 0511) Weight:  [90.1 kg] 90.1 kg (10/07 0500)   Intake/Output from previous day: 10/06 0701 - 10/07 0700 In: 96.9 [IV Piggyback:96.9] Out: 500 [Urine:500] Intake/Output this shift: Total I/O In: 96.9 [IV Piggyback:96.9] Out: 500 [Urine:500]  General appearance: alert, cooperative, appears stated age, and no distress Resp: clear to auscultation bilaterally Cardio: regular rate and rhythm, S1, S2 normal, no murmur, click, rub or gallop GI: soft, non-tender; bowel sounds normal; no masses,  no organomegaly  Lab Results: Recent Labs    04/11/22 1411 04/12/22 0504 04/13/22 0413  WBC 12.6* 10.1 10.8*  HGB 9.2* 8.7* 8.2*  HCT 30.3* 27.9* 27.5*  PLT 239 202 205   BMET Recent Labs    04/11/22 1411 04/12/22 0504 04/13/22 0413  NA 139 140 137  K 4.5 4.1 4.2  CL 104 105 104  CO2 '29 28 30  '$ GLUCOSE 110* 90 101*  BUN  46* 42* 42*  CREATININE 1.40* 1.24 1.47*  CALCIUM 11.0* 10.9* 10.9*   LFT Recent Labs    04/13/22 0413  PROT 6.3*  ALBUMIN 2.7*  AST 17  ALT 20  ALKPHOS 66  BILITOT 0.8   PT/INR Recent Labs    04/11/22 1411 04/12/22 0504  LABPROT 32.1* 30.9*  INR 3.2* 3.0*   Studies/Results: CT ABDOMEN PELVIS WO CONTRAST  Result Date: 04/11/2022 CLINICAL DATA:  Abdominal pain, acute, nonlocalized, hematuria. EXAM: CT ABDOMEN AND PELVIS WITHOUT CONTRAST TECHNIQUE: Multidetector CT imaging of the abdomen and pelvis was performed following the standard protocol without IV contrast. RADIATION DOSE REDUCTION: This exam was performed according to the departmental dose-optimization program which includes automated exposure control, adjustment of the mA and/or kV according to patient size and/or use of iterative reconstruction technique. COMPARISON:  None Available. FINDINGS: Lower chest: Small right pleural effusion, new since prior examination. Moderate coronary artery calcification. Mitral valve replacement has been performed. Cardiac size is at the upper limits of normal. Hepatobiliary: No focal liver abnormality is seen. Status post cholecystectomy. No biliary dilatation. Pancreas: Unremarkable Spleen: Unremarkable Adrenals/Urinary Tract: The adrenal glands are unremarkable. Interval left nephrectomy. Residual right kidney is normal in size and position. No hydronephrosis. No intrarenal or ureteral calculi. The bladder is unremarkable. Stomach/Bowel: Moderate sigmoid and descending colonic diverticulosis without superimposed acute inflammatory change. There is polypoid soft tissue within the rectum, not well assessed on this noncontrast examination but persistent since prior examination suspicious for an underlying rectal mass best seen on axial image # 78/2.  No evidence of obstruction or focal inflammation. The stomach, small bowel, and large bowel are otherwise unremarkable. Status post appendectomy.  Vascular/Lymphatic: Moderate aortoiliac atherosclerotic calcification. No aortic aneurysm. No pathologic adenopathy within the abdomen and pelvis. Reproductive: Marked prostatic enlargement again noted. Other: No abdominal wall hernia. Musculoskeletal: No acute bone abnormality. No lytic or blastic bone lesion. Osseous structures are age-appropriate. IMPRESSION: 1. No acute intra-abdominal pathology identified. No definite radiographic explanation for the patient's reported symptoms. 2. Interval left nephrectomy. 3. Polypoid soft tissue within the rectum, not well assessed on this noncontrast examination but suspicious for an underlying rectal mass. Correlation with endoscopy is recommended. 4. Moderate descending and sigmoid colonic diverticulosis without superimposed acute inflammatory change. 5. Marked prostatic enlargement. 6. Small right pleural effusion, new since prior examination. Aortic Atherosclerosis (ICD10-I70.0). Electronically Signed   By: Fidela Salisbury M.D.   On: 04/11/2022 22:01    Medications: I have reviewed the patient's current medications. Prior to Admission:  Medications Prior to Admission  Medication Sig Dispense Refill Last Dose   amLODipine (NORVASC) 5 MG tablet Take 1 tablet (5 mg total) by mouth daily. 30 tablet 3 04/11/2022   carboxymethylcellulose (REFRESH PLUS) 0.5 % SOLN 1 drop daily as needed (dry eyes).   Past Week   Cholecalciferol (VITAMIN D3 PO) Take 1 tablet by mouth daily.   04/11/2022   docusate sodium (COLACE) 100 MG capsule Take 1 capsule (100 mg total) by mouth 2 (two) times daily. (Patient taking differently: Take 100 mg by mouth daily.)   04/11/2022   ferrous sulfate 325 (65 FE) MG tablet Take 325 mg by mouth daily with breakfast.   04/11/2022   furosemide (LASIX) 20 MG tablet Take 20 mg by mouth daily.   04/11/2022   glucosamine-chondroitin 500-400 MG tablet Take 1 tablet by mouth daily.   04/11/2022   HYDROcodone-acetaminophen (NORCO/VICODIN) 5-325 MG tablet Take  1-2 tablets by mouth every 6 (six) hours as needed for moderate pain or severe pain. 15 tablet 0 unk   pantoprazole (PROTONIX) 40 MG tablet Take 1 tablet (40 mg total) by mouth daily. 30 tablet 0 04/11/2022   predniSONE (DELTASONE) 5 MG tablet Take 2 tablets (10 mg total) by mouth daily. (Patient taking differently: Take 10 mg by mouth daily. Taking 2 tabs by mouth daily) 180 tablet 1 04/11/2022   pyridostigmine (MESTINON) 60 MG tablet Take 1 tablet (60 mg total) by mouth every 8 (eight) hours.   04/11/2022   tamsulosin (FLOMAX) 0.4 MG CAPS capsule Take 0.4 mg by mouth daily.   04/11/2022   warfarin (COUMADIN) 5 MG tablet Take 2.5-5 mg by mouth See admin instructions. Take 1 tablet (5 mg)on  Mon. Weds. Thursday, Friday and Sunday. Taking 1/2 tab( 2.5 mg) on Tues & Saturday   04/10/2022 at 0800   Scheduled:  pantoprazole (PROTONIX) IV  40 mg Intravenous Q12H   predniSONE  10 mg Oral Daily   pyridostigmine  60 mg Oral Q8H   tamsulosin  0.4 mg Oral Daily   Continuous:  cefTRIAXone (ROCEPHIN)  IV 1 g (04/12/22 1824)   UTM:[id number] **OR** acetaminophen  Assessment/Plan: 1) Normocytic anemia-type factorial in light of his kidney disease and his hematuria.  He will be scheduled for EGD and colonoscopy once his INR normalizes.  Continue PPIs for now.  2) Abnormal CT scan of the abdomen pelvis with questionable rectal mass. 3) Paroxysmal atrial fibrillation chronically on warfarin; this currently on hold. Please check INR daily 4) HTN/chronic right heart failure. 5) Ocular myasthenia  gravis on chronic prednisone. 6) Chronic stage III kidney disease. 7) Renal cell carcinoma status post left nephrectomy July 2023.  LOS: 1 day   Juanita Craver 04/13/2022, 6:50 AM

## 2022-04-13 NOTE — Progress Notes (Addendum)
Tele called around 0630 stated pt had 10 beats of Vtach. Notified provider.

## 2022-04-13 NOTE — Progress Notes (Signed)
PROGRESS NOTE    Daniel Sharp  URK:[id number] DOB: 1932-09-05 DOA: 04/11/2022 PCP: Daniel Crook, MD   Brief Narrative:  HPI per Dr. Babs Bertin on 04/11/22 Daniel Sharp is a 88 y.o. 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    Following history is provided by the patient as well as the patient's son who is present at bedside, in addition to my discussions with the EDP and via chart review.   For the last few months, the patient has been experiencing recurrent dark-colored stool for which she has been evaluated by [facility] gastroenterology and is scheduled for EGD/colonoscopy with Dr. Tarri Glenn As an outpatient on April 18, 2022.  He also has a history of chronic iron deficiency anemia associated baseline hemoglobin 8-10, and has been requiring recurrent blood transfusions as an outpatient.  Specifically, he has required transfusion of 3 units PRBC over the last 9 days, with most recent transfusion occurring on 04-08-22.  In the setting of a history of paroxysmal atrial fibrillation he is chronically anticoagulated on warfarin, noting no additional blood thinners as an outpatient, including no concomitant aspirin use.  In this context, the patient reports that he experienced another episode of dark-colored stool yesterday, in the absence of any bright red blood per rectum.  He is on daily oral iron supplementation as an outpatient.  Denies any associated chest pain,  shortness of breath, palpitations, diaphoresis, nausea, vomiting, dizziness, presyncope, or syncope.  No hemoptysis.  No recent trauma.   Patient's medical history is also notable for kidney cancer status post left nephrectomy in July 2023 with Dr. Lovena Neighbours Of urology at [facility].   Yesterday, the patient noted new onset gross hematuria, stating that he noted some bright red blood in his diaper, which he believed was urinary in source.  He reports associated 1 to 2 days of suprapubic discomfort in the absence of dysuria or change in urinary urgency/frequency.  Denies any recent subjective fever, chills, rigors, or generalized myalgias.  No recent cough.   Urinary history also notable for BPH for which he is on Flomax at home.  He has a history of stage IIIa CKD 6 with baseline creatinine 1.3-1.6, most recent prior serum creatinine data point noted to be 1.33 on 03/27/2022.         ED Course:  Vital signs in the ED were notable for the following: Digamex 99.0; heart rate initially 92, which subsequent decreased into the 80s following interval IV fluids; systolic blood pressures in the 120s to 130s; respiratory rate 18-23; oxygen saturation 94 to 96% on room air.   Labs were notable for the following: CMP notable for the following: Bicarbonate 29, BUN 46 compared to 32 on 03/27/2022, creatinine 1.40, calcium, adjusted for mild hypoalbuminemia noted to be 11.8 relative to most recent prior adjusted calcium level of 11.3 on 03/27/2022, otherwise liver enzymes within normal limits.  High-sensitivity troponin I initially 29, 3 cefatrizine 1025.  CBC notable for white cell count 12,600 with 87% neutrophils, hemoglobin 9.2 associated normocytic/normochromic properties as well as nonelevated RDW, relative to most recent prior hemoglobin value  of 7.8 on 10-23, platelet count 239.  INR 3.2.  Lactic acid 1.4.  Urinalysis associated with a clear yellow sample and notable for 11-20 white blood cells as well as greater  than 50 RBCs.  DRE performed by EDP showed brown-colored stool that was fecal occult blood negative.  Urine culture collected prior to initiation of IV antibiotics.   Imaging and additional notable ED work-up: EKG showed atrial fibrillation with multiple PVCs, rate 89, nonspecific T wave inversion in aVL, no evidence of ST changes, including no evidence of ST elevation.  CT abdomen/pelvis without contrast showed no evidence of acute intra-abdominal or acute intrapelvic process, while showing interval left nephrectomy as well as descending sigmoid and colonic diverticulosis in the absence of evidence of diverticulitis obstruction, perforation, or abscess.  Additionally, CT abdomen/pelvis showed marked prostatic enlargement, as well as suggestion of right will mass.   While in the ED, the following were administered: Protonix 40 mg IV x1, Rocephin, normal symptoms with interval loss.   Subsequently, the patient was admitted for further evaluation management microscopic hematuria in the setting is suspected hemorrhagic cystitis complicated by sepsis, with presentation also notable for clinical suspicion for dehydration.    **Interim History Gastroenterology was consulted for his normocytic anemia and recent FOBT positive and they are recommending holding iron for upcoming bowel prep and scheduling colonoscopy and EGD after warfarin washout when INR is less than 1.7 for elective GI procedures.  Given that he had some hematuria will consider discussing with urology will need to continue monitor his hematuria but his hematuria is improving.  GI recommending continue to follow and scheduling the EGD and colonoscopy once his INR normalizes and they are recommending continue PPI for now.   Patient did have an abnormal CT scan of the abdomen pelvis with questionable rectal mass but this will be evaluated once he goes for colonoscopy.  Assessment and Plan:  Gross hematuria -Patient reports new onset gross  hematuria over the last day, although urinalysis is associated with a clear yellow appearing specimen with greater than 50 RBCs, suspicious for hemorrhagic cystitis in the setting of significant pyuria concomitant with new onset suprapubic discomfort along with positive SIRS criteria.   -Of note, he is chronically anticoagulated on warfarin, with mildly supratherapeutic pubic INR presentation, but otherwise no additional blood thinners as an outpatient.  Hemoglobin consistent with baseline.  Appears hemodynamically stable, and otherwise asymptomatic. - Further evaluation management of suspected hemorrhagic cystitis, including continuation of IV Rocephin, as further detailed below.  Continuous lactated Ringer's.  Monitor on symmetry.  Every 4 hour H&H's ordered through 9 AM on 04/12/2022.   -Continue to Hold next dose of warfarin, with repeat INR ordered for the morning and this was 3.1; he will get scoped once his INR is less than.   -Repeat CBC in the morning.  Add on iron studies.  Type and screen ordered.   -May consider discussing with urology versus outpatient follow-up in the context of a history of recent left nephrectomy but given that his hematuria is improving we will hold off for now.   Sepsis due to hemorrhagic cystitis, improving -In the context of new onset suprapubic discomfort, presenting urinalysis with significant pyuria, with SIRS criteria positive via leukocytosis, tachypnea.   -However, no evidence of endorgan damage to meet criteria for severe sepsis, including nonelevated lactic acid, with presenting value of 1.4.   -No evidence of additional underlying infectious process at this time.   -Urine culture collected prior to initiation  of Rocephin, which will be continued for now.  Of note, patient received Rocephin prior to collection of blood cultures. -Continuous IV fluids now stopped.  Follow-up result of urine culture however showed no growth.  Continue IV Rocephin.  -Continue to  monitor for signs decidualization; repeat CBC in a.m. -We will get PT and OT to further evaluate and treat given his weakness  Nonsustained V. tach -Had a 10 beat run of V. tach this morning he is asymptomatic -Electrolytes were within normal limits -Continue to to monitor and trend and continue monitor on telemetry   Chronic iron deficiency anemia -In the setting of chronic blood loss, including from suspected subacute upper gastrointestinal bleed, patient has a baseline hemoglobin range of 8-10, and has required transfusion of 3 units PBC over the course the last 9 days.  Ever, presenting hemoglobin found to be within baseline range, trending up from most recent prior value of 7.8 on 04/08/2022.  Given the patient's reported new onset gross hematuria, will closely monitor considering hemoglobin trend via every 4 hour monitoring as further detailed below.  Hemodynamically stable.  Otherwise asymptomatic.   -Anemia panel was done and showed an iron level of 16, U IBC 152, TIBC of 168, saturation ratios of 10%, ferritin level 757 -Every 4 hour H&H's ordered through 9 AM on 04/12/2022.  Hold this evening's dose of warfarin, with repeat INR ordered for the morning.  Add on iron studies.  Monitor on symmetry.  Resume home oral iron via p.o. route.  Further evaluation management of suspected subacute upper GI bleed, as further detailed below.  Further evaluation management of suspected hemorrhagic cystitis, as above. -Hemoglobin/hematocrit went from 9.2/30.3 -> 8.7/27.9 and is now 8.2/27.5 -GI consulted and planning for an EGD and colonoscopy once warfarin is washed out and INR is less than 1.7 -Repeat INR today was 3.1   Subacute upper GIB -Suspected on the basis of multiple months of intermittent dark-colored stool, with elevated BUN for this, the patient is established as an outpatient with [place] [facility], and is scheduled for EGD/colonoscopy with Dr. Tarri Glenn On 04/18/2022, with corresponding  instructions leading up to this endoscopic evaluation that include holding home warfarin for 5 days leading up to the scopes.   -Appears to be a slow and/or intermittent upper gastrointestinal bleed given the dark coloration of the patient's stool.  However, -DRE performed by EDP today revealed brown-colored stool is fecal occult blood negative this admission.   -Of note, CT abdomen/pelvis reveals evidence suggestive of rectal mass, warranting further evaluation, which is planned via colonoscopy scheduled with [place] gastro[facility]ology on 04/18/2022, as above.  Does not appear to be in overt indication for PRBC transfusion at this time, but will closely monitor ensuing hemoglobin trend as outlined below.   -Chronic prednisone use in the setting of ocular myasthenia gravis noted, with associated increased risk for development of pressure ulcer. -Patient's hemoglobin/hematocrit is now further dropping from 8.7/27.9 is now 8.2/27.5 -Iron panel was checked and showed an iron level of 68, UIBC 152, TIBC 168, saturation ratios of 10%, ferritin level 757 -Continue Every 4 hour H&H's through 9 AM on 04/12/2022.  Type and screen ordered.  Repeat INR in the morning after holding this evening's dose of warfarin.  Monitor on symmetry.  Patient colonoscopy/EGD with [facility] gastroenterology as scheduled for April 18, 2022 but consulted GI while hospitalized and this will be done while in the hospital now   Hypercalcemia -Presenting adjusted calcium level of 11.8, just slightly higher than most  recent prior adjusted value of 11.3 on 03/27/2022.  -Suspect contribution from dehydration given clinical appearance of such.   -Corrected calcium now is still a little elevated given that his calcium was 10.9 with albumin level 2.7 -No overt additional pharmacologic contribution as an outpatient. -We will provide interval IV fluids to attend to suspected element of dehydration, with consideration for further expansion of  evaluation if serum calcium level remains elevated following interval IV fluid resuscitative efforts. -IV fluids, as above now stopped.  Monitor strict I's and O's and daily weights.  Repeat CMP in the morning.     Dehydration -Clinical suspicion for such, including the appearance of dry oral mucous membranes as well as laboratory findings notable for acute prerenal azotemia.  -Appears to be in the setting of   relative intravascular dilation and stemming from sepsis due to hemorrhagic cystitis superimposed on chronic blood loss anemia, and exacerbated the patient Lasix, which will be held in the context of chronic right-sided systolic heart failure with increased risk for preload dependent pathophysiology.  -Monitor strict I's and O's.  Daily weights.  Repeat BMP in the morning.  Gentle IV fluids overnight, as above have now stopped. -Holding home Lasix for now.   Paroxysmal atrial fibrillation -Documented history of such. In setting of CHA2DS2-VASc score of 4, there is an indication for chronic anticoagulation for thromboembolic prophylaxis. Consistent with this, patient is chronically anticoagulated on warfarin, with presenting INR noted to be mildly supratherapeutic.   -In the context subacute upper GI bleed as well as report of new onset gross hematuria, will hold this evening's dose of warfarin, and repeat INR in the morning.  No clinical indication for pharmacologic reversal at this time.. Home AV nodal blocking regimen: None.  Most recent echocardiogram August 2023, with results as further detailed above.  Presenting EKG suggestive rate controlled atrial fibrillation. -Holding home warfarin dose this evening, with plan to repeat INR in the morning.   -Continue to monitor on telemetry.   Chronic right-sided systolic heart failure -Documented as such, with most recent echocardiogram on 02/14/2022 showing evidence of LVEF 55%, indeterminate diastolic parameters, as well as mildly reduced right  ventricular systolic function.  Patient on Lasix 20 mg p.o. daily as an outpatient.  Given increased risk for preload dependent pathophysiology, and given increased risk for intravascular depletion as a consequence of chronic blood loss anemia, will hold home Lasix for now, and closely monitor ensuing hemoglobin, as further outlined above, will provide gentle IV fluids as a component of intravascular repletion given appearance of dehydration. -Hold home Lasix.  Monitor strict I's and O's and daily weights.  At [employer] magnesium low-grade repeat CMP in the morning.   -The IV fluid hydration has now stopped -Continue to monitor for signs and symptoms of volume overload   Essential hypertension -Documented history of such, on Norvasc, Lasix, as an outpatient will also noting home Flomax, with potential antihypertensive implications.   -Normotensive in the ED this evening. -In context of presenting sepsis as well as chronic blood loss, will hold him Norvasc as well as Lasix for now.  Close very resume blood pressure via routine vital signs.  Monitor strict I's and O's Daily weights. -Continue monitor blood pressures per protocol -Last blood pressure reading was 128/57   Ocular myasthenia's gravis -Document history of such, following with neurology as an outpatient, on chronic prednisone therapy as well as Mestinon.  Inclusion of prednisone notable in the context suspected subacute upper distress of bleed, which increases  risk for associated pressure ulcer. -Continue prednisone and Mestinon therapy.   Stage IIIa CKD -Documented history of such, associated baseline creatinine range 1.3-1.6, with contribution from status post left nephrectomy in July 2023.  Presenting serum creatinine found to be within baseline range and now BUNs/creatinine has gone from 42/1.24 and trended up to 42/1.47 -Monitor strict I's and O's Daily weights.   -Avoid further nephrotoxic medications, contrast dyes, hypotension and  dehydration to ensure adequate renal perfusion will need to renally adjust medications and repeat CMP in a.m.   Hypoalbuminemia -Albumin level low trending down from 3.0 is now 2.5 yesterday and 2.7 today -Continue to monitor and trend and repeat CMP in a.m.    DVT prophylaxis: SCDs Start: 04/11/22 2325    Code Status: Full Code Family Communication: Discussed with son at bedside  Disposition Plan:  Level of care: Progressive Status is: Inpatient Remains inpatient appropriate because: Needs further clinical work-up and evaluation with an EGD and colonoscopy once his INR is less than 1.7   Consultants:  Gastroenterology  Procedures:  As delineated as above  Antimicrobials:  Anti-infectives (From admission, onward)    Start     Dose/Rate Route Frequency Ordered Stop   04/12/22 1800  cefTRIAXone (ROCEPHIN) 1 g in sodium chloride 0.9 % 100 mL IVPB        1 g 200 mL/hr over 30 Minutes Intravenous Every 24 hours 04/11/22 2329     04/11/22 2300  cefTRIAXone (ROCEPHIN) 1 g in sodium chloride 0.9 % 100 mL IVPB        1 g 200 mL/hr over 30 Minutes Intravenous  Once 04/11/22 2256 04/11/22 2350       Subjective: Seen and examined at bedside and he is doing okay today.  On a clear liquid diet.  No nausea or vomiting.  Hematuria is improving.  States that he slept okay but was woken up early this morning for lab work.  No other concerns or complaints at this time.  Objective: Vitals:   04/12/22 2111 04/13/22 0137 04/13/22 0500 04/13/22 0511  BP: (!) 115/55 122/60  (!) 128/57  Pulse: 72 73  73  Resp: 20 19  (!) 21  Temp: (!) 97.4 F (36.3 C) 97.7 F (36.5 C)  98 F (36.7 C)  TempSrc: Oral Oral  Oral  SpO2: 90% 95%  98%  Weight:   90.1 kg   Height:        Intake/Output Summary (Last 24 hours) at 04/13/2022 1412 Last data filed at 04/13/2022 1100 Gross per 24 hour  Intake 676.91 ml  Output 900 ml  Net -223.09 ml   Filed Weights   04/11/22 1319 04/13/22 0500  Weight: 87.1  kg 90.1 kg   Examination: Physical Exam:  Constitutional: WN/WD elderly Caucasian overweight male in no acute distress Respiratory: Diminished to auscultation bilaterally, no wheezing, rales, rhonchi or crackles. Normal respiratory effort and patient is not tachypenic. No accessory muscle use.  Unlabored breathing Cardiovascular: RRR, no murmurs / rubs / gallops. S1 and S2 auscultated.  Has mild lower extremity edema Abdomen: Soft, non-tender, slightly distended secondary body habitus. Bowel sounds positive.  GU: Deferred. Musculoskeletal: No clubbing / cyanosis of digits/nails. No joint deformity upper and lower extremities.   Skin: No rashes, lesions, ulcers on limited skin evaluation. No induration; Warm and dry.  Neurologic: CN 2-12 grossly intact with no focal deficits.  Romberg sign and cerebellar reflexes not assessed.  Psychiatric: Normal judgment and insight. Alert and oriented x 3. Normal  mood and appropriate affect.   Data Reviewed: I have personally reviewed following labs and imaging studies  CBC: Recent Labs  Lab 04/08/22 1036 04/11/22 1411 04/12/22 0504 04/13/22 0413  WBC 9.1 12.6* 10.1 10.8*  NEUTROABS 7.3 11.1* 8.3* 8.7*  HGB 7.8 Repeated and verified X2.* 9.2* 8.7* 8.2*  HCT 23.5* 30.3* 27.9* 27.5*  MCV 87.2 95.3 95.2 96.5  PLT 238.0 239 202 941   Basic Metabolic Panel: Recent Labs  Lab 04/11/22 1411 04/11/22 2222 04/12/22 0504 04/13/22 0413  NA 139  --  140 137  K 4.5  --  4.1 4.2  CL 104  --  105 104  CO2 29  --  28 30  GLUCOSE 110*  --  90 101*  BUN 46*  --  42* 42*  CREATININE 1.40*  --  1.24 1.47*  CALCIUM 11.0*  --  10.9* 10.9*  MG  --  2.1 2.0 2.3  PHOS  --   --  3.0 3.3   GFR: Estimated Creatinine Clearance: 39.5 mL/min (A) (by C-G formula based on SCr of 1.47 mg/dL (H)). Liver Function Tests: Recent Labs  Lab 04/11/22 1411 04/12/22 0504 04/13/22 0413  AST '20 17 17  '$ ALT '21 18 20  '$ ALKPHOS 69 62 66  BILITOT 1.1 0.9 0.8  PROT 6.7  6.0* 6.3*  ALBUMIN 3.0* 2.5* 2.7*   No results for input(s): "LIPASE", "AMYLASE" in the last 168 hours. No results for input(s): "AMMONIA" in the last 168 hours. Coagulation Profile: Recent Labs  Lab 04/11/22 1411 04/12/22 0504 04/13/22 1116  INR 3.2* 3.0* 3.1*   Cardiac Enzymes: No results for input(s): "CKTOTAL", "CKMB", "CKMBINDEX", "TROPONINI" in the last 168 hours. BNP (last 3 results) No results for input(s): "PROBNP" in the last 8760 hours. HbA1C: No results for input(s): "HGBA1C" in the last 72 hours. CBG: No results for input(s): "GLUCAP" in the last 168 hours. Lipid Profile: No results for input(s): "CHOL", "HDL", "LDLCALC", "TRIG", "CHOLHDL", "LDLDIRECT" in the last 72 hours. Thyroid Function Tests: No results for input(s): "TSH", "T4TOTAL", "FREET4", "T3FREE", "THYROIDAB" in the last 72 hours. Anemia Panel: Recent Labs    04/12/22 0926  FERRITIN 757*  TIBC 168*  IRON 16*   Sepsis Labs: Recent Labs  Lab 04/11/22 2022  LATICACIDVEN 1.4    Recent Results (from the past 240 hour(s))  Urine Culture     Status: None   Collection Time: 04/11/22  2:24 PM   Specimen: Urine, Clean Catch  Result Value Ref Range Status   Specimen Description   Final    URINE, CLEAN CATCH Performed at [facility], [place] [address] [place], [place] [address]    Special Requests   Final    NONE Performed at [facility], [place] [address], [place], [place] [address]    Culture   Final    NO GROWTH Performed at [facility], [place] [address] [place], [place] [address]    Report Status 04/12/2022 FINAL  Final     Radiology Studies: CT ABDOMEN PELVIS WO CONTRAST  Result Date: 04/11/2022 CLINICAL DATA:  Abdominal pain, acute, nonlocalized, hematuria. EXAM: CT ABDOMEN AND PELVIS WITHOUT CONTRAST TECHNIQUE: Multidetector CT imaging of the abdomen and pelvis was performed following the standard protocol without IV contrast. RADIATION DOSE  REDUCTION: This exam was performed according to the departmental dose-optimization program which includes automated exposure control, adjustment of the mA and/or kV according to patient size and/or use of iterative reconstruction technique. COMPARISON:  None Available. FINDINGS: Lower chest: Small right pleural effusion, new since prior examination. Moderate coronary artery calcification. Mitral valve replacement has been performed. Cardiac size is at the upper limits of normal. Hepatobiliary: No focal liver abnormality is seen. Status post cholecystectomy. No biliary dilatation. Pancreas: Unremarkable Spleen: Unremarkable Adrenals/Urinary Tract: The adrenal glands are unremarkable. Interval left nephrectomy. Residual right kidney is normal in size and position. No hydronephrosis. No intrarenal or ureteral calculi. The bladder is unremarkable. Stomach/Bowel: Moderate sigmoid and descending colonic diverticulosis without superimposed acute inflammatory change. There is polypoid soft tissue within the rectum, not well assessed on this noncontrast examination but persistent since prior examination suspicious for an underlying rectal mass best seen on axial image # 78/2. No evidence of obstruction or focal inflammation. The stomach, small bowel, and large bowel are otherwise unremarkable. Status post appendectomy. Vascular/Lymphatic: Moderate aortoiliac atherosclerotic calcification. No aortic aneurysm. No pathologic adenopathy within the abdomen and pelvis. Reproductive: Marked prostatic enlargement again noted. Other: No abdominal wall hernia. Musculoskeletal: No acute bone abnormality. No lytic or blastic bone lesion. Osseous structures are age-appropriate. IMPRESSION: 1. No acute intra-abdominal pathology identified. No definite radiographic explanation for the patient's reported symptoms. 2. Interval left nephrectomy. 3. Polypoid soft tissue within the rectum, not well assessed on this noncontrast examination but  suspicious for an underlying rectal mass. Correlation with endoscopy is recommended. 4. Moderate descending and sigmoid colonic diverticulosis without superimposed acute inflammatory change. 5. Marked prostatic enlargement. 6. Small right pleural effusion, new since prior examination. Aortic Atherosclerosis (ICD10-I70.0). Electronically Signed   By: Fidela Salisbury M.D.   On: 04/11/2022 22:01     Scheduled Meds:  pantoprazole (PROTONIX) IV  40 mg Intravenous Q12H   predniSONE  10 mg Oral Daily   pyridostigmine  60 mg Oral Q8H   tamsulosin  0.4 mg Oral Daily   Continuous Infusions:  cefTRIAXone (ROCEPHIN)  IV 1 g (04/12/22 1824)    LOS: 1 day   Raiford Noble, DO [facility] Available via Epic secure chat 7am-7pm After these hours, please refer to coverage provider listed on [email or web address] 04/13/2022, 2:12 PM

## 2022-04-14 DIAGNOSIS — R3129 Other microscopic hematuria: Secondary | ICD-10-CM | POA: Diagnosis not present

## 2022-04-14 LAB — CBC WITH DIFFERENTIAL/PLATELET
Abs Immature Granulocytes: 0.06 10*3/uL (ref 0.00–0.07)
Basophils Absolute: 0 10*3/uL (ref 0.0–0.1)
Basophils Relative: 0 %
Eosinophils Absolute: 0.1 10*3/uL (ref 0.0–0.5)
Eosinophils Relative: 1 %
HCT: 29.1 % — ABNORMAL LOW (ref 39.0–52.0)
Hemoglobin: 8.7 g/dL — ABNORMAL LOW (ref 13.0–17.0)
Immature Granulocytes: 1 %
Lymphocytes Relative: 9 %
Lymphs Abs: 1 10*3/uL (ref 0.7–4.0)
MCH: 29 pg (ref 26.0–34.0)
MCHC: 29.9 g/dL — ABNORMAL LOW (ref 30.0–36.0)
MCV: 97 fL (ref 80.0–100.0)
Monocytes Absolute: 0.7 10*3/uL (ref 0.1–1.0)
Monocytes Relative: 7 %
Neutro Abs: 9.2 10*3/uL — ABNORMAL HIGH (ref 1.7–7.7)
Neutrophils Relative %: 82 %
Platelets: 203 10*3/uL (ref 150–400)
RBC: 3 MIL/uL — ABNORMAL LOW (ref 4.22–5.81)
RDW: 15 % (ref 11.5–15.5)
WBC: 11.1 10*3/uL — ABNORMAL HIGH (ref 4.0–10.5)
nRBC: 0 % (ref 0.0–0.2)

## 2022-04-14 LAB — COMPREHENSIVE METABOLIC PANEL
ALT: 21 U/L (ref 0–44)
AST: 17 U/L (ref 15–41)
Albumin: 2.6 g/dL — ABNORMAL LOW (ref 3.5–5.0)
Alkaline Phosphatase: 68 U/L (ref 38–126)
Anion gap: 5 (ref 5–15)
BUN: 37 mg/dL — ABNORMAL HIGH (ref 8–23)
CO2: 30 mmol/L (ref 22–32)
Calcium: 11.5 mg/dL — ABNORMAL HIGH (ref 8.9–10.3)
Chloride: 102 mmol/L (ref 98–111)
Creatinine, Ser: 1.17 mg/dL (ref 0.61–1.24)
GFR, Estimated: >60 mL/min (ref >60)
Glucose, Bld: 98 mg/dL (ref 70–99)
Potassium: 5.1 mmol/L (ref 3.5–5.1)
Sodium: 137 mmol/L (ref 135–145)
Total Bilirubin: 0.8 mg/dL (ref 0.3–1.2)
Total Protein: 6.4 g/dL — ABNORMAL LOW (ref 6.5–8.1)

## 2022-04-14 LAB — PROTIME-INR
INR: 2.9 — ABNORMAL HIGH (ref 0.8–1.2)
Prothrombin Time: 29.7 seconds — ABNORMAL HIGH (ref 11.4–15.2)

## 2022-04-14 LAB — MAGNESIUM: Magnesium: 2.2 mg/dL (ref 1.7–2.4)

## 2022-04-14 LAB — PHOSPHORUS: Phosphorus: 2.7 mg/dL (ref 2.5–4.6)

## 2022-04-14 NOTE — Progress Notes (Signed)
OT Cancellation Note  Patient Details Name: Daniel Sharp MRN: [id number] DOB: Jan 28, 1933   Cancelled Treatment:    Reason Eval/Treat Not Completed: Fatigue/lethargy limiting ability to participate. Asleep upon entry. Not easily awoken. OT to check back as time allows.   Gloris Manchester OTR/L Supplemental OT, Department of rehab services [phone]  Joelie Schou R H. 04/14/2022, 1:03 PM

## 2022-04-14 NOTE — Evaluation (Signed)
Physical Therapy Evaluation Patient Details Name: Daniel Sharp MRN: [id number] DOB: [birth year] Today's Date: 04/14/2022  History of Present Illness  Patient is a 88 y.o. male presenting to [facility] ED on 10/5 with hematuria, subacute upper GI bleed and sepsis due to hemorrhagic cystitis. Further work-up pending. PMHx significant for L renal cell carcinoma s/p L nephrectomy 01/22/22 (did not undergo chemo or radiation), Afib, OA, HTN, MG, back surgery, cholecystectomy, appendectomy, chronic iron deficiency, and recent fall with dislocated Rt finger, fractured Rt foot, Rt 5th rib fracture.   Clinical Impression  Pt is an 88 y.o. male with above HPI resulting in the deficits listed below (see PT Problem List). Pt required MOD A for bed mobility and performance of transfers with use of RW. Pt reports he has not been able to move without assist/supervision since December 2022, is typically able to negotiate stairs with supervision and ambulate household distances with supervision, recently has been needing increased assist from son since not feeling well and most assist required today during PT session. Pt lives with son who is available to assist as well as daughter-in-law PRN.  Pt is presenting significantly below baseline mobility level, recommend short term rehab stay at SNF prior to d/c home. If pt/family decline SNF family will need to be able to provide current level of assist needed, will continue to assess mobility progress while in hospital setting for any discharge and equipment recommendation updates. Pt will benefit from skilled PT to maximize functional mobility in order to increase independence and decrease caregiver burden.         Recommendations for follow up therapy are one component of a multi-disciplinary discharge planning process, led by the attending physician.  Recommendations may be updated based on patient status, additional functional criteria and insurance authorization.  Follow Up  Recommendations Skilled nursing-short term rehab (<3 hours/day) Can patient physically be transported by private vehicle: Yes    Assistance Recommended at Discharge Frequent or constant Supervision/Assistance  Patient can return home with the following  A lot of help with walking and/or transfers;A little help with bathing/dressing/bathroom;Help with stairs or ramp for entrance;Assistance with cooking/housework;Assist for transportation    Equipment Recommendations None recommended by PT  Recommendations for Other Services       Functional Status Assessment Patient has had a recent decline in their functional status and demonstrates the ability to make significant improvements in function in a reasonable and predictable amount of time.     Precautions / Restrictions Precautions Precautions: Fall Restrictions Weight Bearing Restrictions: No      Mobility  Bed Mobility Overal bed mobility: Needs Assistance Bed Mobility: Supine to Sit     Supine to sit: Mod assist, HOB elevated     General bed mobility comments: increased time due to fatigue and general weakness. use of bed rails. HEAVY R lateral lean while seated EOB- pt reports happens at baseline sometimes and son has to assist him with bed mobility. MOD-MAX A to correct despite B UE support, slightly improved with prolonged sitting EOB and varying UE placement.    Transfers Overall transfer level: Needs assistance Equipment used: Rolling walker (2 wheels) Transfers: Sit to/from Stand, Bed to chair/wheelchair/BSC Sit to Stand: Mod assist   Step pivot transfers: Mod assist       General transfer comment: MOD A for power up to stand with cues for hand placement. elevated bed surface to simulate home environment. sit to stand x2, attempted to take step on initial stand, but pt unable  and returned to sitting. Able to take steps to chair laterally and then turn hips to recliner chair with MOD A and R lateral lean.     Ambulation/Gait                  Stairs            Wheelchair Mobility    Modified Rankin (Stroke Patients Only)       Balance Overall balance assessment: Needs assistance Sitting-balance support: Feet supported, Bilateral upper extremity supported Sitting balance-Leahy Scale: Poor     Standing balance support: Bilateral upper extremity supported, During functional activity, Reliant on assistive device for balance Standing balance-Leahy Scale: Zero Standing balance comment: reliant on external support of RW and therapist                             Pertinent Vitals/Pain Pain Assessment Pain Assessment: No/denies pain    Home Living Family/patient expects to be discharged to:: Private residence Living Arrangements: Children (son) Available Help at Discharge: Family;Available 24 hours/day Type of Home: House Home Access: Stairs to enter Entrance Stairs-Rails: Chemical engineer of Steps: 2 at front, 5 in garage   Home Layout: Two level;Able to live on main level with bedroom/bathroom Home Equipment: Shower seat;Hand held shower head;Grab bars - tub/shower;Wheelchair - Publishing copy (2 wheels) Additional Comments: Pt reports he has not been able to move independently since about december 2022.    Prior Function Prior Level of Function : Needs assist             Mobility Comments: Son has been assisting with all mobility since december. Son/daughter-in-law assist with stairs.       Hand Dominance   Dominant Hand: Right    Extremity/Trunk Assessment   Upper Extremity Assessment Upper Extremity Assessment: Generalized weakness    Lower Extremity Assessment Lower Extremity Assessment: Generalized weakness    Cervical / Trunk Assessment Cervical / Trunk Assessment:  (difficult to assess- pt unable to achieve full upright during session)  Communication   Communication: No difficulties  Cognition  Arousal/Alertness: Awake/alert Behavior During Therapy: WFL for tasks assessed/performed Overall Cognitive Status: Within Functional Limits for tasks assessed                                          General Comments      Exercises     Assessment/Plan    PT Assessment Patient needs continued PT services  PT Problem List Decreased strength;Decreased activity tolerance;Decreased balance;Decreased mobility;Decreased knowledge of use of DME       PT Treatment Interventions DME instruction;Gait training;Stair training;Functional mobility training;Therapeutic activities;Therapeutic exercise;Balance training;Patient/family education    PT Goals (Current goals can be found in the Care Plan section)  Acute Rehab PT Goals Patient Stated Goal: regain strength and independence PT Goal Formulation: With patient/family Time For Goal Achievement: 04/28/22 Potential to Achieve Goals: Good    Frequency Min 3X/week     Co-evaluation               AM-PAC PT "6 Clicks" Mobility  Outcome Measure Help needed turning from your back to your side while in a flat bed without using bedrails?: A Little Help needed moving from lying on your back to sitting on the side of a flat bed without using bedrails?: A Lot Help needed moving  to and from a bed to a chair (including a wheelchair)?: A Lot Help needed standing up from a chair using your arms (e.g., wheelchair or bedside chair)?: A Lot Help needed to walk in hospital room?: Total Help needed climbing 3-5 steps with a railing? : Total 6 Click Score: 11    End of Session Equipment Utilized During Treatment: Gait belt Activity Tolerance: Patient tolerated treatment well;Patient limited by fatigue Patient left: in chair;with call bell/phone within reach;with chair alarm set Nurse Communication: Mobility status PT Visit Diagnosis: Unsteadiness on feet (R26.81);Muscle weakness (generalized) (M62.81);Difficulty in walking, not  elsewhere classified (R26.2)    Time: 5643-3295 PT Time Calculation (min) (ACUTE ONLY): 35 min   Charges:   PT Evaluation $PT Eval Low Complexity: 1 Low PT Treatments $Therapeutic Activity: 8-22 mins        Festus Barren PT, DPT  Acute Rehabilitation Services  Office [phone]  04/14/2022, 4:11 PM

## 2022-04-14 NOTE — Plan of Care (Signed)
  Problem: Activity: Goal: Risk for activity intolerance will decrease Outcome: Progressing   Problem: Nutrition: Goal: Adequate nutrition will be maintained Outcome: Progressing   Problem: Safety: Goal: Ability to remain free from injury will improve Outcome: Progressing   

## 2022-04-14 NOTE — Progress Notes (Signed)
Progress Note for [name]  Subjective: No complaints.  Objective: Vital signs in last 24 hours: Temp:  [97.9 F (36.6 C)-99.8 F (37.7 C)] 98.4 F (36.9 C) (10/08 1438) Pulse Rate:  [76-90] 83 (10/08 1438) Resp:  [18-25] 20 (10/08 1438) BP: (121-136)/(57-99) 128/61 (10/08 1438) SpO2:  [91 %-98 %] 96 % (10/08 1438) Weight:  [85.9 kg] 85.9 kg (10/08 0526) Last BM Date : 04/14/22  Intake/Output from previous day: 10/07 0701 - 10/08 0700 In: 921.1 [P.O.:818; IV Piggyback:103.1] Out: 1400 [Urine:1400] Intake/Output this shift: Total I/O In: 1320 [P.O.:1320] Out: 1100 [Urine:1100]  General appearance: alert, no distress, and weak appearing GI: soft, non-tender; bowel sounds normal; no masses,  no organomegaly  Lab Results: Recent Labs    04/12/22 0504 04/13/22 0413 04/14/22 0445  WBC 10.1 10.8* 11.1*  HGB 8.7* 8.2* 8.7*  HCT 27.9* 27.5* 29.1*  PLT 202 205 203   BMET Recent Labs    04/12/22 0504 04/13/22 0413 04/14/22 0445  NA 140 137 137  K 4.1 4.2 5.1  CL 105 104 102  CO2 '28 30 30  '$ GLUCOSE 90 101* 98  BUN 42* 42* 37*  CREATININE 1.24 1.47* 1.17  CALCIUM 10.9* 10.9* 11.5*   LFT Recent Labs    04/14/22 0445  PROT 6.4*  ALBUMIN 2.6*  AST 17  ALT 21  ALKPHOS 68  BILITOT 0.8   PT/INR Recent Labs    04/13/22 1116 04/14/22 0445  LABPROT 32.0* 29.7*  INR 3.1* 2.9*   Hepatitis Panel No results for input(s): "HEPBSAG", "HCVAB", "HEPAIGM", "HEPBIGM" in the last 72 hours. C-Diff No results for input(s): "CDIFFTOX" in the last 72 hours. Fecal Lactopherrin No results for input(s): "FECLLACTOFRN" in the last 72 hours.  Studies/Results: No results found.  Medications: Scheduled:  pantoprazole (PROTONIX) IV  40 mg Intravenous Q12H   predniSONE  10 mg Oral Daily   pyridostigmine  60 mg Oral Q8H   tamsulosin  0.4 mg Oral Daily   Continuous:  cefTRIAXone (ROCEPHIN)  IV 1 g (04/13/22 1831)    Assessment/Plan: 1) Soft tissue rectal mass. 2)  Anemia. 3) PAF on coumadin.   The patient's INR is still elevated.  It is currently at 2.9.  If it does not drop significantly by tomorrow Vitamin K should be considered.  Plan: 1) Follow INR. 2) EGD/colonoscopy once INR is normal or near normal. 3) [facility] will resume care in the AM.  LOS: 2 days   [name] D 04/14/2022, 2:42 PM

## 2022-04-14 NOTE — Progress Notes (Signed)
PROGRESS NOTE    Daniel Sharp  ZDG:[id number]  DOB: 1932-08-08  DOA: 04/11/2022 PCP: [name], MD Outpatient Specialists:   Hospital course: 88 y.o. 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  Subjective:  Patient seen with his son at bedside.  Patient states he is okay, is very tired and wishes he could eat.  [name] is quite frustrated with the course of events, notes that he feels the EGD and colonoscopy could have been done as an outpatient and he is worried about his father getting weaker having not eaten in the hospital.  I noted that H&H seems to have stabilized and patient has been hemodynamically stable.  Is hoping he can have his endoscopies done tomorrow but understands that he needs a lower INR to do that safely.  Objective: Vitals:   04/14/22 0039 04/14/22 0526 04/14/22 0537 04/14/22 1438  BP:   (!) 121/99 128/61  Pulse: 76  90 83  Resp:   (!) 25 20  Temp:   99.8 F (37.7 C) 98.4 F (36.9 C)  TempSrc:   Oral Oral  SpO2: 92%  91% 96%  Weight:  85.9 kg    Height:        Intake/Output Summary (Last 24 hours) at 04/14/2022 1628 Last data filed at 04/14/2022 1300 Gross per 24 hour  Intake 1543.09 ml  Output 2100 ml  Net -556.91 ml   Filed Weights   04/11/22 1319 04/13/22 0500 04/14/22 0526  Weight: 87.1 kg 90.1 kg 85.9 kg     Exam:  General: Chronically ill-appearing gentleman with resting tremor bilaterally sitting in bed in NAD.  Patient denies confusion Eyes: sclera anicteric, conjuctiva mild injection bilaterally CVS: S1-S2, regular  Respiratory:   decreased air entry bilaterally secondary to decreased inspiratory effort, rales at bases  GI: NABS, soft, NT  LE: No edema.  Neuro: Patient has some bilateral tremor, has some slowness of thought but patient and son feel he is at baseline.   Assessment & Plan:   Melena Plan is for endoscopy once INR is decreased. Appreciate GI consultation, they are recommending possible vitamin K with INR continues to be elevated tomorrow. H&H appear to have stabilized, patient remains hemodynamically stable  Hematuria secondary to hemorrhagic cystitis Has responded well to Rocephin with resolution of hematuria and sepsis  Weakness [name] notes he is appreciably weaker than he was prior to admission.  He is concerned that patient has not had anything substantial to eat in the past 4 days and also notes that he has mostly been lying in bed. Agreed with him that would need to expedite endoscopy so that patient can start eating again, as noted above patient might benefit from vitamin K if warranted. PT is working with the patient.  AKI Creatinine has normalized  PAF Rate is controlled not on any AV nodal blocking agents Clearly warfarin is being held due to hematuria and melena  HTN/HFpEF Hold all home antihypertensives and Lasix BP at present is normal  Myasthenia gravis Continue prednisone and Mestinon   Scheduled Meds:  pantoprazole (PROTONIX) IV  40 mg Intravenous Q12H   predniSONE  10 mg Oral Daily   pyridostigmine  60 mg Oral  Q8H   tamsulosin  0.4 mg Oral Daily   Continuous Infusions:  cefTRIAXone (ROCEPHIN)  IV 1 g (04/13/22 1831)    Data Reviewed:  Basic Metabolic Panel: Recent Labs  Lab 04/11/22 1411 04/11/22 2222 04/12/22 0504 04/13/22 0413 04/14/22 0445  NA 139  --  140 137 137  K 4.5  --  4.1 4.2 5.1  CL 104  --  105 104 102  CO2 29  --  '28 30 30  '$ GLUCOSE 110*  --  90 101* 98  BUN 46*  --  42* 42* 37*  CREATININE 1.40*  --  1.24 1.47* 1.17  CALCIUM  11.0*  --  10.9* 10.9* 11.5*  MG  --  2.1 2.0 2.3 2.2  PHOS  --   --  3.0 3.3 2.7    CBC: Recent Labs  Lab 04/08/22 1036 04/11/22 1411 04/12/22 0504 04/13/22 0413 04/14/22 0445  WBC 9.1 12.6* 10.1 10.8* 11.1*  NEUTROABS 7.3 11.1* 8.3* 8.7* 9.2*  HGB 7.8 Repeated and verified X2.* 9.2* 8.7* 8.2* 8.7*  HCT 23.5* 30.3* 27.9* 27.5* 29.1*  MCV 87.2 95.3 95.2 96.5 97.0  PLT 238.0 239 202 205 203    Studies: No results found.  Principal Problem:   Microscopic hematuria Active Problems:   Hypertension   Persistent atrial fibrillation (HCC)   Hypercalcemia   Gross hematuria   Upper GI bleed   Sepsis ([place])   Hemorrhagic cystitis   Dehydration   Acute prerenal azotemia     Vashti Hey, [facility]  If 7PM-7AM, please contact night-coverage [email or web address]   LOS: 2 days

## 2022-04-15 ENCOUNTER — Inpatient Hospital Stay: Payer: Self-pay

## 2022-04-15 ENCOUNTER — Inpatient Hospital Stay (HOSPITAL_COMMUNITY): Payer: Medicare HMO

## 2022-04-15 DIAGNOSIS — L8990 Pressure ulcer of unspecified site, unspecified stage: Secondary | ICD-10-CM | POA: Insufficient documentation

## 2022-04-15 DIAGNOSIS — J9601 Acute respiratory failure with hypoxia: Secondary | ICD-10-CM | POA: Diagnosis not present

## 2022-04-15 DIAGNOSIS — N19 Unspecified kidney failure: Secondary | ICD-10-CM | POA: Diagnosis not present

## 2022-04-15 DIAGNOSIS — G9340 Encephalopathy, unspecified: Secondary | ICD-10-CM

## 2022-04-15 DIAGNOSIS — I609 Nontraumatic subarachnoid hemorrhage, unspecified: Secondary | ICD-10-CM

## 2022-04-15 DIAGNOSIS — E860 Dehydration: Secondary | ICD-10-CM | POA: Diagnosis not present

## 2022-04-15 DIAGNOSIS — I5033 Acute on chronic diastolic (congestive) heart failure: Secondary | ICD-10-CM

## 2022-04-15 DIAGNOSIS — J9602 Acute respiratory failure with hypercapnia: Secondary | ICD-10-CM

## 2022-04-15 DIAGNOSIS — N3091 Cystitis, unspecified with hematuria: Secondary | ICD-10-CM | POA: Diagnosis not present

## 2022-04-15 DIAGNOSIS — R3129 Other microscopic hematuria: Secondary | ICD-10-CM | POA: Diagnosis not present

## 2022-04-15 LAB — COMPREHENSIVE METABOLIC PANEL
ALT: 23 U/L (ref 0–44)
ALT: 23 U/L (ref 0–44)
AST: 19 U/L (ref 15–41)
AST: 18 U/L (ref 15–41)
Albumin: 2.6 g/dL — ABNORMAL LOW (ref 3.5–5.0)
Albumin: 2.4 g/dL — ABNORMAL LOW (ref 3.5–5.0)
Alkaline Phosphatase: 65 U/L (ref 38–126)
Alkaline Phosphatase: 67 U/L (ref 38–126)
Anion gap: 0 — ABNORMAL LOW (ref 5–15)
Anion gap: 3 — ABNORMAL LOW (ref 5–15)
BUN: 37 mg/dL — ABNORMAL HIGH (ref 8–23)
BUN: 34 mg/dL — ABNORMAL HIGH (ref 8–23)
CO2: 32 mmol/L (ref 22–32)
CO2: 29 mmol/L (ref 22–32)
Calcium: 11.3 mg/dL — ABNORMAL HIGH (ref 8.9–10.3)
Calcium: 11.3 mg/dL — ABNORMAL HIGH (ref 8.9–10.3)
Chloride: 100 mmol/L (ref 98–111)
Chloride: 100 mmol/L (ref 98–111)
Creatinine, Ser: 1.29 mg/dL — ABNORMAL HIGH (ref 0.61–1.24)
Creatinine, Ser: 1.17 mg/dL (ref 0.61–1.24)
GFR, Estimated: 54 mL/min — ABNORMAL LOW (ref >60)
GFR, Estimated: >60 mL/min (ref >60)
Glucose, Bld: 126 mg/dL — ABNORMAL HIGH (ref 70–99)
Glucose, Bld: 143 mg/dL — ABNORMAL HIGH (ref 70–99)
Potassium: 5.4 mmol/L — ABNORMAL HIGH (ref 3.5–5.1)
Potassium: 5.3 mmol/L — ABNORMAL HIGH (ref 3.5–5.1)
Sodium: 132 mmol/L — ABNORMAL LOW (ref 135–145)
Sodium: 132 mmol/L — ABNORMAL LOW (ref 135–145)
Total Bilirubin: 0.8 mg/dL (ref 0.3–1.2)
Total Bilirubin: 0.8 mg/dL (ref 0.3–1.2)
Total Protein: 6.1 g/dL — ABNORMAL LOW (ref 6.5–8.1)
Total Protein: 5.8 g/dL — ABNORMAL LOW (ref 6.5–8.1)

## 2022-04-15 LAB — MRSA NEXT GEN BY PCR, NASAL: MRSA by PCR Next Gen: NOT DETECTED

## 2022-04-15 LAB — CBC
HCT: 29.6 % — ABNORMAL LOW (ref 39.0–52.0)
HCT: 27.3 % — ABNORMAL LOW (ref 39.0–52.0)
Hemoglobin: 8.9 g/dL — ABNORMAL LOW (ref 13.0–17.0)
Hemoglobin: 8 g/dL — ABNORMAL LOW (ref 13.0–17.0)
MCH: 28.9 pg (ref 26.0–34.0)
MCH: 28.7 pg (ref 26.0–34.0)
MCHC: 30.1 g/dL (ref 30.0–36.0)
MCHC: 29.3 g/dL — ABNORMAL LOW (ref 30.0–36.0)
MCV: 96.1 fL (ref 80.0–100.0)
MCV: 97.8 fL (ref 80.0–100.0)
Platelets: 210 10*3/uL (ref 150–400)
Platelets: 186 10*3/uL (ref 150–400)
RBC: 3.08 MIL/uL — ABNORMAL LOW (ref 4.22–5.81)
RBC: 2.79 MIL/uL — ABNORMAL LOW (ref 4.22–5.81)
RDW: 14.8 % (ref 11.5–15.5)
RDW: 14.6 % (ref 11.5–15.5)
WBC: 10 10*3/uL (ref 4.0–10.5)
WBC: 10.9 10*3/uL — ABNORMAL HIGH (ref 4.0–10.5)
nRBC: 0 % (ref 0.0–0.2)
nRBC: 0 % (ref 0.0–0.2)

## 2022-04-15 LAB — BLOOD GAS, ARTERIAL
Acid-Base Excess: 5.3 mmol/L — ABNORMAL HIGH (ref 0.0–2.0)
Acid-Base Excess: 6.2 mmol/L — ABNORMAL HIGH (ref 0.0–2.0)
Bicarbonate: 33.4 mmol/L — ABNORMAL HIGH (ref 20.0–28.0)
Bicarbonate: 34.3 mmol/L — ABNORMAL HIGH (ref 20.0–28.0)
Delivery systems: POSITIVE
Drawn by: 441371
Expiratory PAP: 8 cmH2O
FIO2: 30 %
Inspiratory PAP: 18 cmH2O
O2 Saturation: 99 %
O2 Saturation: 99.2 %
Patient temperature: 36.7
Patient temperature: 36.4
pCO2 arterial: 70 mmHg (ref 32–48)
pCO2 arterial: 63 mmHg — ABNORMAL HIGH (ref 32–48)
pH, Arterial: 7.28 — ABNORMAL LOW (ref 7.35–7.45)
pH, Arterial: 7.34 — ABNORMAL LOW (ref 7.35–7.45)
pO2, Arterial: 102 mmHg (ref 83–108)
pO2, Arterial: 108 mmHg (ref 83–108)

## 2022-04-15 LAB — CBC WITH DIFFERENTIAL/PLATELET
Abs Immature Granulocytes: 0.09 10*3/uL — ABNORMAL HIGH (ref 0.00–0.07)
Basophils Absolute: 0 10*3/uL (ref 0.0–0.1)
Basophils Relative: 0 %
Eosinophils Absolute: 0.1 10*3/uL (ref 0.0–0.5)
Eosinophils Relative: 1 %
HCT: 27.1 % — ABNORMAL LOW (ref 39.0–52.0)
Hemoglobin: 8.1 g/dL — ABNORMAL LOW (ref 13.0–17.0)
Immature Granulocytes: 1 %
Lymphocytes Relative: 7 %
Lymphs Abs: 0.8 10*3/uL (ref 0.7–4.0)
MCH: 29.1 pg (ref 26.0–34.0)
MCHC: 29.9 g/dL — ABNORMAL LOW (ref 30.0–36.0)
MCV: 97.5 fL (ref 80.0–100.0)
Monocytes Absolute: 0.6 10*3/uL (ref 0.1–1.0)
Monocytes Relative: 5 %
Neutro Abs: 10.5 10*3/uL — ABNORMAL HIGH (ref 1.7–7.7)
Neutrophils Relative %: 86 %
Platelets: 197 10*3/uL (ref 150–400)
RBC: 2.78 MIL/uL — ABNORMAL LOW (ref 4.22–5.81)
RDW: 14.6 % (ref 11.5–15.5)
WBC: 12.1 10*3/uL — ABNORMAL HIGH (ref 4.0–10.5)
nRBC: 0 % (ref 0.0–0.2)

## 2022-04-15 LAB — TSH: TSH: 1.072 u[IU]/mL (ref 0.350–4.500)

## 2022-04-15 LAB — BASIC METABOLIC PANEL
Anion gap: 4 — ABNORMAL LOW (ref 5–15)
BUN: 37 mg/dL — ABNORMAL HIGH (ref 8–23)
CO2: 31 mmol/L (ref 22–32)
Calcium: 12 mg/dL — ABNORMAL HIGH (ref 8.9–10.3)
Chloride: 102 mmol/L (ref 98–111)
Creatinine, Ser: 1.26 mg/dL — ABNORMAL HIGH (ref 0.61–1.24)
GFR, Estimated: 55 mL/min — ABNORMAL LOW (ref >60)
Glucose, Bld: 107 mg/dL — ABNORMAL HIGH (ref 70–99)
Potassium: 5.4 mmol/L — ABNORMAL HIGH (ref 3.5–5.1)
Sodium: 137 mmol/L (ref 135–145)

## 2022-04-15 LAB — MAGNESIUM
Magnesium: 2.3 mg/dL (ref 1.7–2.4)
Magnesium: 2 mg/dL (ref 1.7–2.4)

## 2022-04-15 LAB — PROTIME-INR
INR: 2.5 — ABNORMAL HIGH (ref 0.8–1.2)
Prothrombin Time: 27.1 seconds — ABNORMAL HIGH (ref 11.4–15.2)

## 2022-04-15 LAB — VITAMIN B12: Vitamin B-12: 424 pg/mL (ref 180–914)

## 2022-04-15 LAB — PROCALCITONIN: Procalcitonin: 0.18 ng/mL

## 2022-04-15 LAB — AMMONIA: Ammonia: 19 umol/L (ref 9–35)

## 2022-04-15 LAB — TROPONIN I (HIGH SENSITIVITY): Troponin I (High Sensitivity): 54 ng/L — ABNORMAL HIGH (ref <18)

## 2022-04-15 LAB — PHOSPHORUS: Phosphorus: 3.5 mg/dL (ref 2.5–4.6)

## 2022-04-15 LAB — GLUCOSE, CAPILLARY: Glucose-Capillary: 132 mg/dL — ABNORMAL HIGH (ref 70–99)

## 2022-04-15 MED ORDER — CHLORHEXIDINE GLUCONATE CLOTH 2 % EX PADS
6.0000 | MEDICATED_PAD | Freq: Every day | CUTANEOUS | Status: DC
  Administered 2022-04-15 – 2022-04-20 (×6): 6 via TOPICAL

## 2022-04-15 MED ORDER — IOHEXOL 350 MG/ML SOLN
100.0000 mL | Freq: Once | INTRAVENOUS | Status: AC | PRN
  Administered 2022-04-15: 100 mL via INTRAVENOUS

## 2022-04-15 MED ORDER — SODIUM CHLORIDE (PF) 0.9 % IJ SOLN
INTRAMUSCULAR | Status: AC
  Filled 2022-04-15: qty 50

## 2022-04-15 MED ORDER — VITAMIN K1 10 MG/ML IJ SOLN
5.0000 mg | Freq: Once | INTRAVENOUS | Status: AC
  Administered 2022-04-15: 5 mg via INTRAVENOUS
  Filled 2022-04-15: qty 0.5

## 2022-04-15 MED ORDER — ETOMIDATE 2 MG/ML IV SOLN
INTRAVENOUS | Status: AC
  Filled 2022-04-15: qty 20

## 2022-04-15 MED ORDER — ROCURONIUM BROMIDE 10 MG/ML (PF) SYRINGE
PREFILLED_SYRINGE | INTRAVENOUS | Status: AC
  Filled 2022-04-15: qty 10

## 2022-04-15 MED ORDER — FENTANYL CITRATE (PF) 100 MCG/2ML IJ SOLN
INTRAMUSCULAR | Status: AC
  Filled 2022-04-15: qty 2

## 2022-04-15 MED ORDER — DEXTROSE IN LACTATED RINGERS 5 % IV SOLN: INTRAVENOUS | Status: DC

## 2022-04-15 MED ORDER — FUROSEMIDE 10 MG/ML IJ SOLN
40.0000 mg | Freq: Once | INTRAMUSCULAR | Status: AC
  Administered 2022-04-15: 40 mg via INTRAVENOUS
  Filled 2022-04-15: qty 4

## 2022-04-15 MED ORDER — SODIUM CHLORIDE 0.9 % IV SOLN: INTRAVENOUS | Status: DC

## 2022-04-15 MED ORDER — SODIUM ZIRCONIUM CYCLOSILICATE 10 G PO PACK
10.0000 g | PACK | Freq: Once | ORAL | Status: AC
  Administered 2022-04-15: 10 g via ORAL
  Filled 2022-04-15: qty 1

## 2022-04-15 MED ORDER — MIDAZOLAM HCL 2 MG/2ML IJ SOLN
INTRAMUSCULAR | Status: AC
  Filled 2022-04-15: qty 2

## 2022-04-15 MED ORDER — ORAL CARE MOUTH RINSE
15.0000 mL | OROMUCOSAL | Status: DC
  Administered 2022-04-16 – 2022-04-30 (×41): 15 mL via OROMUCOSAL

## 2022-04-15 MED ORDER — ORAL CARE MOUTH RINSE: 15.0000 mL | OROMUCOSAL | Status: DC | PRN

## 2022-04-15 NOTE — Consult Note (Signed)
Neurosurgery Consultation  Reason for Consult: Subarachnoid hemorrhage Referring Physician: Alfredia Ferguson  CC: Altered mental status  HPI: This is a 88 y.o. man w/ h/o PAF and MVR as well as myasthenia that was admitted for hematuria and melena, weakness, had AMS today and CTH performed given his supratherapeutic INR. No headache or acute onset AMS, no WHOL, no neck stiffness. Remainder of labs reviewed, shows he has an AKI with ABG c/w respiratory acidosis and PCO2 of 70, just transferred to the ICU for BiPAP.   ROS: A 14 point ROS was performed and is negative except as noted in the HPI.   PMHx:  Past Medical History:  Diagnosis Date   A-fib [facility])    history of   Arthritis    Dyspnea    H/O mitral valve repair 07/2003   Hypertension    Ocular myasthenia gravis ([place])    takes pred   Pneumonia    FamHx:  Family History  Problem Relation Age of Onset   Other Mother        Gallbladder removed   Stroke Father        intracranial stroke   Stroke Brother    Colon cancer Neg Hx    Rectal cancer Neg Hx    SocHx:  reports that he has quit smoking. His smoking use included cigarettes, pipe, and cigars. He has quit using smokeless tobacco.  His smokeless tobacco use included chew. He reports current alcohol use of about 7.0 standard drinks of alcohol per week. He reports that he does not use drugs.  Exam: Vital signs in last 24 hours: Temp:  [97.9 F (36.6 C)-98.2 F (36.8 C)] 98.2 F (36.8 C) (10/09 1550) Pulse Rate:  [71-94] 83 (10/09 1751) Resp:  [16-22] 22 (10/09 1751) BP: (129-150)/(50-68) 129/50 (10/09 1550) SpO2:  [96 %-99 %] 97 % (10/09 1751) FiO2 (%):  [30 %] 30 % (10/09 1751) General: Awake, alert, cooperative, lying in ICU bed Head: Normocephalic and atruamatic HEENT: Neck supple Pulmonary: BiPAP mask on with good seal and good chest rise b/l, IPAP of 18 and EPAP of 8 with an FiO2 of 30, pulling around 500cc of Vt Cardiac: irregularly irregular in the 70s Abdomen: S NT  ND Extremities: Warm and well perfused x4 Neuro: Bipap mask limits speech output but awake and alert, interactive, follows commands easily with two fingers on R and thumbs up on the left without cues, MAEx4 without preference, face symmetric, extraoculars intact with some mild ptosis but per family this is c/w his baseline appearance regarding the ptosis    Assessment and Plan: 88 y.o. man w/ MMP with AMS. [facility] personally reviewed, which shows small area of tSAH overlying the right convexity.   -no acute neurosurgical intervention indicated at this time, metabolic encephalopathy (CO2 / BUN / etc) much more likely to be causing his symptoms -if safe for transport from a respiratory standpoint, repeat CT head in AM -INR 2.5, I think active reversal has more risk than benefit, if CT head is stable in the AM then I don't think we need to pursue active reversal. Family asked regarding stopping anticoagulation long-term. Discussed that it's an individualized decision, I'm certainly fine with that but will defer to primary regarding risks-benefits from a systemic standpoint. If anticoagulation is stopped, I'm obviously okay with starting prophylactic dose anticoagulation with a small tSAH like this. -discussed with family, will defer respiratory failure, myasthenia, etc to primary -please call with any concerns or questions  Judith Part, MD  04/15/22 6:35 PM [facility]

## 2022-04-15 NOTE — Progress Notes (Signed)
Pt remains on bipap, nif/vc held at this time.

## 2022-04-15 NOTE — Progress Notes (Signed)
Pt asleep on bipap, unable to do nif/vc at this time.

## 2022-04-15 NOTE — Progress Notes (Cosign Needed)
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 Also had gross hematuria / hemorrhagic cystitis / sepsis on admission on admission    Assessment / Plan  # [place] Anemia probably related to CKD and GI blood loss.  Has dark stools ( on iron) but heme negative. Polypoid lesion in rectum on non-contrast CT scan raising concern for malignancy.  Fe studies don't suggest iron deficiency but he has apparently been taking iron so hard to know Has gotten 4 u PRBC since 7/18 (including 2 units on 10/3).  Hgb stable at 8.9 since last transfusion INR 2.5. Plan is for EGD + colonoscopy when INR < 2. This is my first time meeting him but he seems very weak / deconditioned. I don't know that he will tolerate a bowel prep.    # Weakness, PT working with him  # PAF, warfarin on hold. Rate controlled  # Sepsis / gross hematuria ( POA). Felt to be d/t hemorrhagic cystitis. Improving so TRH holding on Urology consult for now.   # Nonsustained V tach on 10/7. Was asymptomatic.   # Myasthenia gravis on prednisone and mestinon  I saw the patient also - he looked unwell - I communicated with Dr. Alfredia Ferguson.  Gatha Mayer, MD, [facility]  Subjective   No complaints.   Objective   Imaging:  CT ABDOMEN PELVIS WO CONTRAST  Result Date: 04/11/2022 CLINICAL DATA:  Abdominal pain, acute, nonlocalized, hematuria. EXAM: CT ABDOMEN AND PELVIS WITHOUT CONTRAST TECHNIQUE: Multidetector CT imaging of the abdomen and pelvis was performed following the standard protocol  without IV contrast. RADIATION DOSE REDUCTION: This exam was performed according to the departmental dose-optimization program which includes automated exposure control, adjustment of the mA and/or kV according to patient size and/or use of iterative reconstruction technique. COMPARISON:  None Available. FINDINGS: Lower chest: Small right pleural effusion, new since prior examination. Moderate coronary artery calcification. Mitral valve replacement has been performed. Cardiac size is at the upper limits of normal. Hepatobiliary: No focal liver abnormality is seen. Status post cholecystectomy. No biliary dilatation. Pancreas: Unremarkable Spleen: Unremarkable Adrenals/Urinary Tract: The adrenal glands are unremarkable. Interval left nephrectomy. Residual right kidney is normal in size and position. No hydronephrosis. No intrarenal or ureteral calculi. The bladder is unremarkable. Stomach/Bowel: Moderate sigmoid and descending colonic diverticulosis without superimposed acute inflammatory change. There is polypoid soft tissue within the rectum, not well assessed on this noncontrast examination but persistent since prior examination suspicious for an underlying rectal mass best seen on axial image # 78/2. No evidence of obstruction or focal inflammation. The stomach, small bowel, and large bowel are otherwise unremarkable. Status post appendectomy. Vascular/Lymphatic: Moderate aortoiliac atherosclerotic calcification. No aortic aneurysm. No pathologic adenopathy within the abdomen and pelvis. Reproductive: Marked prostatic enlargement again noted. Other: No abdominal wall hernia. Musculoskeletal: No acute bone abnormality. No lytic or blastic bone lesion. Osseous structures are age-appropriate. IMPRESSION: 1. No acute intra-abdominal pathology identified. No definite radiographic explanation for the patient's reported symptoms. 2. Interval left nephrectomy. 3. Polypoid soft tissue within the rectum, not well assessed  on this noncontrast examination but  suspicious for an underlying rectal mass. Correlation with endoscopy is recommended. 4. Moderate descending and sigmoid colonic diverticulosis without superimposed acute inflammatory change. 5. Marked prostatic enlargement. 6. Small right pleural effusion, new since prior examination. Aortic Atherosclerosis (ICD10-I70.0). Electronically Signed   By: Fidela Salisbury M.D.   On: 04/11/2022 22:01    Lab Results: Recent Labs    04/13/22 0413 04/14/22 0445 04/15/22 0351  WBC 10.8* 11.1* 10.0  HGB 8.2* 8.7* 8.9*  HCT 27.5* 29.1* 29.6*  PLT 205 203 210   BMET Recent Labs    04/13/22 0413 04/14/22 0445 04/15/22 0351  NA 137 137 137  K 4.2 5.1 5.4*  CL 104 102 102  CO2 '30 30 31  '$ GLUCOSE 101* 98 107*  BUN 42* 37* 37*  CREATININE 1.47* 1.17 1.26*  CALCIUM 10.9* 11.5* 12.0*   LFT Recent Labs    04/14/22 0445  PROT 6.4*  ALBUMIN 2.6*  AST 17  ALT 21  ALKPHOS 68  BILITOT 0.8   PT/INR Recent Labs    04/14/22 0445 04/15/22 0351  LABPROT 29.7* 27.1*  INR 2.9* 2.5*     Scheduled inpatient medications:   pantoprazole (PROTONIX) IV  40 mg Intravenous Q12H   predniSONE  10 mg Oral Daily   pyridostigmine  60 mg Oral Q8H   tamsulosin  0.4 mg Oral Daily   Continuous inpatient infusions:   sodium chloride 75 mL/hr at 04/15/22 0901   cefTRIAXone (ROCEPHIN)  IV 1 g (04/14/22 1742)   phytonadione (VITAMIN K) 5 mg in dextrose 5 % 50 mL IVPB 5 mg (04/15/22 1101)   PRN inpatient medications: acetaminophen **OR** acetaminophen  Vital signs in last 24 hours: Temp:  [97.9 F (36.6 C)-98.4 F (36.9 C)] 98.1 F (36.7 C) (10/09 0403) Pulse Rate:  [71-83] 79 (10/09 0403) Resp:  [20] 20 (10/08 2133) BP: (128-136)/(54-68) 136/68 (10/09 0403) SpO2:  [96 %-98 %] 96 % (10/09 0403) Last BM Date : 04/14/22  Intake/Output Summary (Last 24 hours) at 04/15/2022 1104 Last data filed at 04/15/2022 6503 Gross per 24 hour  Intake 1540 ml  Output 2225 ml  Net  -685 ml    Intake/Output from previous day: 10/08 0701 - 10/09 0700 In: 2140 [P.O.:2140] Out: 2725 [Urine:2725] Intake/Output this shift: No intake/output data recorded.   Physical Exam:  General: Alert male in NAD.  Heart:  Regular rate, 1+ BLE edema Pulmonary: Slightly labored breathing when talking. Has 02 per nasal cannula. 02 sat 96% Abdomen: Soft, nondistended, nontender. Normal bowel sounds.  Neurologic: Alert Musculoskeletal: generalized muscle weakness. Unable to even partially pull himself forward to lung auscultation Psych: Cooperative.    Principal Problem:   Microscopic hematuria Active Problems:   Hypertension   Persistent atrial fibrillation (HCC)   Hypercalcemia   Gross hematuria   Upper GI bleed   Sepsis ([place])   Hemorrhagic cystitis   Dehydration   Acute prerenal azotemia   Pressure injury of skin     LOS: 3 days   Tye Savoy ,NP 04/15/2022, 11:04 AM

## 2022-04-15 NOTE — Progress Notes (Signed)
   04/15/22 1550  Vitals  Temp 98.2 F (36.8 C)  Temp Source Oral  BP (!) 129/50  MAP (mmHg) 71  BP Location Right Arm  BP Method Automatic  Patient Position (if appropriate) Lying  Pulse Rate 76  Pulse Rate Source Monitor  Resp 16  MEWS COLOR  MEWS Score Color Green  Oxygen Therapy  SpO2 99 %  O2 Device Nasal Cannula  O2 Flow Rate (L/min) 2 L/min  MEWS Score  MEWS Temp 0  MEWS Systolic 0  MEWS Pulse 0  MEWS RR 0  MEWS LOC 0  MEWS Score 0   Patient difficult to awake from nap upon RT's assessment for NIF testing. RT notified this RN. Patient's vitals were taken and shown above at that time. Patient was able to open eyes, smile and respond to touch/painful stimuli.  About 1 hour before this patient was alert, taking in clear liquids, and conversing w/ son and nursing staff. Sheikh DO notified of patient's changes. New orders put in at that time. Rapid was notified. Patient transferred to ICU and bedside report was given to ICU RN.

## 2022-04-15 NOTE — Plan of Care (Signed)

## 2022-04-15 NOTE — Progress Notes (Signed)
Critical ABG values given to Rapid Response RN.

## 2022-04-15 NOTE — Significant Event (Signed)
Rapid Response Event Note   Reason for Call :  Altered mental status  Initial Focused Assessment:  MD at bedside on arrival. Patient in bed not responding to voice. Responds to sternal rub/pain. Pupils 38m equal and brisk bilaterally. Lungs diminished bilaterally. No signs of respiratory distress. CBG 132. Son at bedside and stated that his father has had a significant change over the last few hours regarding mental status- was alert and more talkative. Transported to CT by RRRN, RN and NT. Head CT showed "Vague right frontal sulcal hyperdensity may represent a trace developing subarachnoid hemorrhage"Alfredia FergusonMD notified.   While in CT patient did briefly follow commands and nod yes/no to questions.  ABG ordered and pCO2 70, pH 7.28 (communicated with MD while in CT)   Interventions:  STAT Head CT, CT Angio Neck, BIPAP and Tx to SDU. Started on BIPAP immediately on arrival to SDU.  Event Summary:   MD Notified: SAlfredia FergusonMD Call Time: 1[place]Time: [id number] Time: 1[place] RN

## 2022-04-15 NOTE — Progress Notes (Signed)
RT attempted to do a NIF and vital capacity with this patient but the patient is minimally responsive.  RN called to bedside, checked his vitals.  [name] is very concerned that this is a big change for his dad.  RT will attempt the NIF and VC when the patient is more responsive.  RT spoke with RN and suggest having Rapid Response look at him.

## 2022-04-15 NOTE — Care Management Important Message (Signed)
Important Message  Patient Details IM Letter placed in Patients room. Name: Daniel Sharp MRN: [id number] Date of Birth: 1932/09/27   Medicare Important Message Given:  Yes     [name] 04/15/2022, 11:59 AM

## 2022-04-15 NOTE — Evaluation (Signed)
Occupational Therapy Evaluation Patient Details Name: Daniel Sharp MRN: [id number] DOB: [birth year] Today's Date: 04/15/2022   History of Present Illness Patient is a 88 y.o. male presenting to [facility] ED on 10/5 with hematuria, subacute upper GI bleed and sepsis due to hemorrhagic cystitis. Further work-up pending. PMHx significant for L renal cell carcinoma s/p L nephrectomy 01/22/22 (did not undergo chemo or radiation), Afib, OA, HTN, MG, back surgery, cholecystectomy, appendectomy, chronic iron deficiency, and recent fall with dislocated Rt finger, fractured Rt foot, Rt 5th rib fracture.   Clinical Impression   Pt has been living with his son for several months, his home is in [place]. Pt had progressed to ambulating with distant supervision and RW, assist for stairs and supervision for ADLs. Pt presents with significant weakness. He requires max to total assist for bed mobility and demonstrates poor sitting balance with heavy R side lean and flexed spine. Did not attempt to stand pt in absence of a second person. Pt with tremulous UE movements/incoordination. He requires mod to total assist for ADLs. Recommending SNF for further rehab upon discharge.      Recommendations for follow up therapy are one component of a multi-disciplinary discharge planning process, led by the attending physician.  Recommendations may be updated based on patient status, additional functional criteria and insurance authorization.   Follow Up Recommendations  Skilled nursing-short term rehab (<3 hours/day)    Assistance Recommended at Discharge Frequent or constant Supervision/Assistance  Patient can return home with the following Two people to help with walking and/or transfers;A lot of help with bathing/dressing/bathroom;Assistance with cooking/housework;Assistance with feeding;Direct supervision/assist for medications management;Direct supervision/assist for financial management;Assist for transportation;Help with stairs  or ramp for entrance    Functional Status Assessment  Patient has had a recent decline in their functional status and/or demonstrates limited ability to make significant improvements in function in a reasonable and predictable amount of time  Equipment Recommendations  Other (comment) (defer to next venue)    Recommendations for Other Services       Precautions / Restrictions Precautions Precautions: Fall Restrictions Weight Bearing Restrictions: No      Mobility Bed Mobility Overal bed mobility: Needs Assistance Bed Mobility: Supine to Sit, Sit to Supine     Supine to sit: Max assist Sit to supine: Total assist   General bed mobility comments: assist for all aspects, increased time    Transfers                   General transfer comment: unable to stand      Balance Overall balance assessment: Needs assistance Sitting-balance support: Bilateral upper extremity supported, Feet supported Sitting balance-Leahy Scale: Poor Sitting balance - Comments: R side lean                                   ADL either performed or assessed with clinical judgement   ADL Overall ADL's : Needs assistance/impaired Eating/Feeding: Maximal assistance;Bed level   Grooming: Maximal assistance;Bed level   Upper Body Bathing: Total assistance;Sitting   Lower Body Bathing: Total assistance;Bed level   Upper Body Dressing : Maximal assistance;Bed level   Lower Body Dressing: Total assistance;Bed level       Toileting- Clothing Manipulation and Hygiene: Total assistance;Bed level               Vision Baseline Vision/History: 1 Wears glasses Ability to See in Adequate Light: 0 Adequate Patient  Visual Report: No change from baseline       Perception     Praxis      Pertinent Vitals/Pain Pain Assessment Pain Assessment: No/denies pain     Hand Dominance Right   Extremity/Trunk Assessment Upper Extremity Assessment Upper Extremity  Assessment: Generalized weakness (tremulous)   Lower Extremity Assessment Lower Extremity Assessment: Defer to PT evaluation   Cervical / Trunk Assessment Cervical / Trunk Assessment: Other exceptions (weakness with forward head)   Communication Communication Communication: Expressive difficulties (minimally verbal, low volume)   Cognition Arousal/Alertness: Awake/alert Behavior During Therapy: WFL for tasks assessed/performed Overall Cognitive Status: Impaired/Different from baseline Area of Impairment: Orientation, Following commands, Memory, Problem solving, Awareness                 Orientation Level: Disoriented to, Time, Situation   Memory: Decreased short-term memory Following Commands: Follows one step commands with increased time   Awareness: Intellectual Problem Solving: Slow processing, Decreased initiation       General Comments       Exercises     Shoulder Instructions      Home Living Family/patient expects to be discharged to:: Private residence Living Arrangements: Children (son and daughter in law) Available Help at Discharge: Family;Available 24 hours/day Type of Home: House Home Access: Stairs to enter [employer] of Steps: 2 at front, 5 in garage Entrance Stairs-Rails: Left;Right Home Layout: Two level;Able to live on main level with bedroom/bathroom     Bathroom Shower/Tub: Occupational psychologist: Handicapped height     Home Equipment: [place] held shower head;Grab bars - tub/shower;Wheelchair - Publishing copy (2 wheels)   Additional Comments: Pt reports he has not been able to move independently since about december 2022.      Prior Functioning/Environment Prior Level of Function : Needs assist             Mobility Comments: Son has been assisting with all mobility since december. Son/daughter-in-law assist with stairs. ADLs Comments: grossly was independent        OT Problem List:  Decreased strength;Impaired balance (sitting and/or standing);Decreased coordination;Decreased cognition;Decreased knowledge of use of DME or AE;Impaired UE functional use;Impaired tone      OT Treatment/Interventions: Self-care/ADL training;Therapeutic exercise;Therapeutic activities;Cognitive remediation/compensation;Patient/family education;Balance training;DME and/or AE instruction    OT Goals(Current goals can be found in the care plan section) Acute Rehab OT Goals OT Goal Formulation: With patient Time For Goal Achievement: 04/29/22 Potential to Achieve Goals: Fair ADL Goals Pt Will Perform Eating: with set-up;sitting Pt Will Perform Grooming: with min assist;sitting Pt Will Perform Upper Body Dressing: with min assist;sitting Pt Will Transfer to Toilet: with +2 assist;with mod assist;stand pivot transfer;bedside commode Additional ADL Goal #1: Pt will perform bed mobility with mod assist in preparation for ADLs. Additional ADL Goal #2: Pt will demonstrate fair sitting balance at EOB x 10 minutes as a precursor to ADLs.  OT Frequency: Min 2X/week    Co-evaluation              AM-PAC OT "6 Clicks" Daily Activity     Outcome Measure Help from another person eating meals?: A Lot Help from another person taking care of personal grooming?: A Lot Help from another person toileting, which includes using toliet, bedpan, or urinal?: Total Help from another person bathing (including washing, rinsing, drying)?: Total Help from another person to put on and taking off regular upper body clothing?: A Lot Help from another person to put on and  taking off regular lower body clothing?: Total 6 Click Score: 9   End of Session Equipment Utilized During Treatment: Oxygen  Activity Tolerance: Patient tolerated treatment well Patient left: in bed;with call bell/phone within reach;with bed alarm set;with family/visitor present  OT Visit Diagnosis: Muscle weakness (generalized) (M62.81);Other  symptoms and signs involving cognitive function                Time: 1225-1309 OT Time Calculation (min): 44 min Charges:  OT General Charges $OT Visit: 1 Visit OT Evaluation $OT Eval Moderate Complexity: 1 Mod OT Treatments $Self Care/Home Management : 8-22 mins $Therapeutic Activity: 8-22 mins Cleta Alberts, OTR/L Acute Rehabilitation Services Office: [phone]  Malka So 04/15/2022, 1:29 PM

## 2022-04-15 NOTE — Progress Notes (Signed)
[facility] Progress Note Patient Name: Daniel Sharp DOB: May 12, 1933 MRN: [id number]   Date of Service  04/15/2022  HPI/Events of Note  ABG reviewed.  eICU Interventions  No intervention.        Kerry Kass [place] 04/15/2022, 11:24 PM

## 2022-04-15 NOTE — Progress Notes (Signed)
PICC order/ PIV consult: PICC protocol order closed inadvertently as the order appeared while completing PIV consult order. Order re-entered from closed order.

## 2022-04-15 NOTE — Consult Note (Addendum)
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  Also chronic iron deficiency anemia.  He is on chronic steroids for his ocular myasthenia and?  Mestinon  He has few month history of dark-colored stool for which outpatient EGD and colonoscopy was scheduled for April 18, 2022.  Prior to admission required 3 units of PRBC but he was admitted 04/11/2022 with severe gross hematuria suspected hemorrhagic cystitis /admission creatinine of 1.4 mg percent.  Warfarin was held.  Empiric Rocephin was started.  CT abdomen did suggest a rectal mass.  Coumadin was held.  He also had slightly high for thalassemia at 11.8 treated with hydration.  Albumin admission was low at 2.5  Course in the hospital - 04/13/2022: GI consult.  Plan for endoscopy and colonoscopy once INR normalizes [at admission 3].  Also had nonsustained V. Tach -04/14/2022: Found to be generally weak but otherwise alert.  INR 2.9.  Complains of general fatigue.  Son reported progressive weakness thought to be due to physical deconditioning from poor p.o. intake  - 04/15/2022: Progressive weakness and unresponsiveness.  Son concerned significant negative change.  Minimally responsive.  Blood gas with severe acute on chronic respiratory acidosis.  Son denies that he is a smoker having underlying lung disease.  Patient transferred to the ICU and CCM consulted.  Neuro consultation placed.  Chest x-ray suggestive of pulmonary congestion  [significantly worse compared to August 2023], CT head with trace SAH  Past Medical History:    has a past medical history of A-fib ([place]), Arthritis, Dyspnea, H/O mitral valve repair (07/2003), Hypertension, Ocular myasthenia gravis ([place]), and Pneumonia.   reports that he has quit smoking. His smoking use included cigarettes, pipe, and cigars. He has quit using smokeless tobacco.  His smokeless tobacco use included chew.  Past Surgical History:  Procedure Laterality Date   APPENDECTOMY     BACK SURGERY     cyst removed L3 and L4   CHOLECYSTECTOMY     FINGER SURGERY Right    R hand   MITRAL VALVE REPAIR     on coumadin   NOSE SURGERY     ROBOTIC ASSITED PARTIAL NEPHRECTOMY Left 01/22/2022   Procedure: XI ROBOTIC ASSITED NEPHRECTOMY;  Surgeon: Ceasar Mons, MD;  Location: [facility];  Service: Urology;  Laterality: Left;    No Known Allergies  Immunization History  Administered Date(s) Administered   Fluad Quad(high Dose 65+) 03/27/2022   Influenza, High Dose Seasonal PF 03/27/2021   PFIZER(Purple Top)SARS-COV-2 Vaccination 08/05/2019, 08/20/2019, 05/05/2020, 10/27/2020    Family History  Problem Relation Age of Onset   Other Mother        Gallbladder removed   Stroke Father        intracranial stroke   Stroke Brother    Colon cancer Neg Hx    Rectal cancer Neg Hx      Current Facility-Administered Medications:    0.9 %  sodium chloride infusion, , Intravenous, Continuous, Raiford Noble [place], [place], Last Rate: 75 mL/hr at 04/15/22 0901, New Bag at 04/15/22 0901   acetaminophen (TYLENOL)  tablet 650 mg, 650 mg, Oral, Q6H PRN, 650 mg at 04/15/22 1429 **OR** acetaminophen (TYLENOL) suppository 650 mg, 650 mg, Rectal, Q6H PRN, Howerter, Justin B, DO   cefTRIAXone (ROCEPHIN) 1 g in sodium chloride 0.9 % 100 mL IVPB, 1 g, Intravenous, Q24H, Howerter, Justin B, DO, Last Rate: 200 mL/hr at 04/14/22 1742, 1 g at 04/14/22 1742   Chlorhexidine Gluconate Cloth 2 % PADS 6 each,  6 each, Topical, Daily, Sheikh, Omair Latif, DO   pantoprazole (PROTONIX) injection 40 mg, 40 mg, Intravenous, Q12H, Howerter, Justin B, DO, 40 mg at 04/15/22 0904   predniSONE (DELTASONE) tablet 10 mg, 10 mg, Oral, Daily, Howerter, Justin B, DO, 10 mg at 04/15/22 0904   pyridostigmine (MESTINON) tablet 60 mg, 60 mg, Oral, Q8H, Howerter, Justin B, DO, 60 mg at 04/15/22 1429   sodium chloride (PF) 0.9 % injection, , , ,    tamsulosin (FLOMAX) capsule 0.4 mg, 0.4 mg, Oral, Daily, Howerter, Justin B, DO, 0.4 mg at 04/15/22 [facility] Events:  04/11/2022 - admit 04/15/2022: CCM consult  Interim History / Subjective:   04/15/2022 - seen in bed [place] long ICU  Objective   Blood pressure (!) 129/50, pulse 83, temperature 98.2 F (36.8 C), temperature source Oral, resp. rate (!) 22, height 6' 0.5" (1.842 m), weight 85.9 kg, SpO2 97 %.    FiO2 (%):  [30 %] 30 %   Intake/Output Summary (Last 24 hours) at 04/15/2022 1809 Last data filed at 04/15/2022 1500 Gross per 24 hour  Intake 1116.32 ml  Output 1225 ml  Net -108.68 ml   Filed Weights   04/11/22 1319 04/13/22 0500 04/14/22 0526  Weight: 87.1 kg 90.1 kg 85.9 kg    Examination: Little gentleman on BiPAP.  Somewhat responsive with the BiPAP.  Has bilateral ptosis.  Distant breath sounds.  No wheezing no crackles normal heart sounds abdomen soft.  Appears diffusely weak.  Later 30 min -> wide awake and giving thumbs up  Assessment & Plan:   Acute on chronic respiratory acidosis with hypoxemic respiratory failure with pulmonary congestion-04/15/2022  04/15/2022 -> started on BiPAP and improved mental status.  Etiology is myasthenic crisis versus acute on chronic diastolic heart failure versus hospital-acquired pneumonia  P:   BiPAP to continue Low threshold to intubate given myasthenia gravis    ?  History of pain requiring opioids History of ocular myasthenia gravis -on prednisone and Mestinon Acute on  chronic encephalopathy -04/15/2022 CT head with small trace subarachnoid hemorrhage 04/15/2022 [seen by neurosurgery]  - 04/15/2022 bilateral ptosis present in the ICU.  Rapidly improved mental status in the ICU after BiPAP  P:   See neurosurgery consult note 04/15/2022 -monitor for the [facility]  Neuro consult -might need IVIG  Avoid the following meds in the setting of myasthenia   - As a rule, the listed drugs should be avoided whenever possible, and patients with MG should be followed closely when any new drug is introduced.  Drugs that may exacerbate MG   Antibiotics   Aminoglycosides: e.g., streptomycin, tobramycin, kanamycin   Quinolones: e.g., ciprofloxacin, levofloxacin, ofloxacin, gatifloxacin   Macrolides: e.g., erythromycin, azithromycin, telithromycin   Nondepolarizing muscle relaxants for surgery   d-Tubocurarine (curare), pancuronium, vecuronium, atracurium   Beta-blocking agents   Propranolol, atenolol, metoprolol   Local anesthetics and related agents   Procaine, Xylocaine in large amounts   Procainamide (for arrhythmias)   Botulinum toxin   Botox exacerbates weakness.   Quinine derivatives  Quinine, quinidine, chloroquine, mefloquine (Lariam)   Magnesium   Decreases ACh release   Penicillamine   May cause MG   Drugs with important interactions in MG   Cyclosporine   Broad range of drug interactions, which may raise or lower cyclosporine levels   Azathioprine   Avoid allopurinol; c    History of hypertension on amlodipine and  Plan  - Holding amlodipine at the moment    Chronic cor pulmonale with pulmonary systolic pressure in the 44R with reduction in right ventricular function -baseline August 2023 -on Lasix  10/9 -suspect acute on chronic diastolic heart failure  P: Lasix x1 BiPAP Cycle cardiac enzymes Troponin Get echo  Known atrial fibrillation on Coumadin -Coumadin on hold since admission given hematuria and GI bleed history   =  Anticoagulation on hold secondary to anticipated endoscopy and colonoscopy  P: Telemetry Hold anticoagulation Vit K given by triad to get INR down for Endoscopy    Culture-negative sepsis suspected at admission secondary to hemorrhagic cystitis -   04/15/2022 -afebrile  P:   Ceftriaxone empiric 04/11/2022 >> Check lactate Check procalcitonin Dc abx if needed    BPH on Flomax at home Status post nephrectomy for renal cell cancer July 2023 CKD baseline creatinine 1.2-1.6 Admitted with severe gross hematuria  04/15/2022 -creatinine range around  P:  Monitor Might need urology consultation 04/16/22    Mild hypercalcemia at admission -due to dehydration  P: Monitor closely and correct as needed     History of GI bleed prior to admission -outpatient endoscopy and colonoscopy scheduled for 04/18/2022 -on outpatient PPI Discovery of potential rectal polyp mass at this admission   04/15/2022 -awaiting endoscopy and colonoscopy pending correction of INR  P:   Stress ulcer prophylaxis GI consult following   Anemia of GI bleed and now also hematuria at admission  04/15/2022: No active GI bleeding or hematuria  P:  - INR on hold - PRBC for hgb </= 6.9gm%    - exceptions are   -  if ACS susepcted/confirmed then transfuse for hgb </= 8.0gm%,  or    -  active bleeding with hemodynamic instability, then transfuse regardless of hemoglobin value   At at all times try to transfuse 1 unit prbc as possible with exception of active hemorrhage     At risk for hypo and hyperglycemia At risk for relative adrenal insufficiency    P:   SSI Might need stress dose steroids but myasthenia gravis weighs   Chronic prednisone therapy -at admission Mild protein calorie malnutrition -at admission  Plan  -PT OT consult as needed    Best practice (daily eval):  Diet: N.p.o. Pain/Anxiety/Delirium protocol (if indicated): Precedex if needed VAP protocol (if indicated): Oral  care DVT prophylaxis: Chronic anticoagulation warfarin currently on hold GI prophylaxis: PPI Glucose control: SSI Mobility: Bedrest Code Status: Partial code Family Communication: Son updated at the bedside Disposition: ICU  Otehrs : PICC ordered  Triad PRimary - ccm consult. If intubated ccm primary  Goals of Care:   Interdisciplinary Goals of Care Family Meeting   Date carried out: 04/15/2022  Location of the meeting: Bedside  Member's involved: Physician and Other: Dr Alfredia Ferguson and RN  Durable Power of Attorney or acting medical decision maker: Son    Discussion: We discussed goals of care for Daniel Sharp .    Code status: Limited Code or DNR with short term  Disposition: Continue current acute care  Time spent for the meeting: 10 min -the son  expressed full that the patient and the father would improve but he does not want the patient on prolonged life support a.  Based on this we felt that intubation was appropriate.  We also discussed cardiac arrest and felt that CPR would not be medically beneficial.  Therefore patient will be a no CPR.        ATTESTATION & SIGNATURE   The patient Daniel Sharp is critically ill with multiple organ systems failure and requires high complexity decision making for assessment and support, frequent evaluation and titration of therapies, application of advanced monitoring technologies and extensive interpretation of multiple databases.   Critical Care Time devoted to patient care services described in this note is  70  Minutes. This time reflects time of care of this signee Dr Brand Males. This critical care time does not reflect procedure time, or teaching time or supervisory time of PA/NP/Med student/Med Resident etc but could involve care discussion time      SIGNATURE    Dr. Brand Males, M.D., F.C.C.P,  Pulmonary and Critical Care Medicine Staff Physician, [place] Director - Interstitial Lung Disease   Program  P[facility] [place] at [place], [place], [address]  NPI Number:  NPI #[id number]  Pager: [phone], If no answer  -> Check AMION or Try [phone] Telephone (clinical office): [phone] Telephone (research): [phone]  6:09 PM 04/15/2022   04/15/2022 6:09 PM    LABS    PULMONARY Recent Labs  Lab 04/15/22 1706  PHART 7.28*  PCO2ART 70*  PO2ART 102  HCO3 33.4*  O2SAT 99    CBC Recent Labs  Lab 04/14/22 0445 04/15/22 0351 04/15/22 1708  HGB 8.7* 8.9* 8.1*  HCT 29.1* 29.6* 27.1*  WBC 11.1* 10.0 12.1*  PLT 203 210 197    COAGULATION Recent Labs  Lab 04/11/22 1411 04/12/22 0504 04/13/22 1116 04/14/22 0445 04/15/22 0351  INR 3.2* 3.0* 3.1* 2.9* 2.5*    CARDIAC  No results for input(s): "TROPONINI" in the last 168 hours. No results for input(s): "PROBNP" in the last 168 hours.  CHEMISTRY Recent Labs  Lab 04/11/22 1411 04/11/22 2222 04/12/22 0504 04/13/22 0413 04/14/22 0445 04/15/22 0351  NA 139  --  140 137 137 137  K 4.5  --  4.1 4.2 5.1 5.4*  CL 104  --  105 104 102 102  CO2 29  --  '28 30 30 31  '$ GLUCOSE 110*  --  90 101* 98 107*  BUN 46*  --  42* 42* 37* 37*  CREATININE 1.40*  --  1.24 1.47* 1.17 1.26*  CALCIUM 11.0*  --  10.9* 10.9* 11.5* 12.0*  MG  --  2.1 2.0 2.3 2.2  --   PHOS  --   --  3.0 3.3 2.7  --    Estimated Creatinine Clearance: 46 mL/min (A) (by C-G formula based on SCr of 1.26 mg/dL (H)).   LIVER Recent Labs  Lab 04/11/22 1411 04/12/22 0504 04/13/22 0413 04/13/22 1116 04/14/22 0445 04/15/22 0351  AST '20 17 17  '$ --  17  --   ALT '21 18 20  '$ --  21  --   ALKPHOS 69 62 66  --  68  --   BILITOT 1.1 0.9 0.8  --  0.8  --   PROT 6.7 6.0* 6.3*  --  6.4*  --   ALBUMIN 3.0* 2.5* 2.7*  --  2.6*  --  INR 3.2* 3.0*  --  3.1* 2.9* 2.5*     INFECTIOUS Recent Labs  Lab 04/11/22 2022  LATICACIDVEN 1.4     ENDOCRINE CBG (last 3)  Recent Labs     04/15/22 1708  GLUCAP 132*         IMAGING x48h  - image(s) personally visualized  -   highlighted in bold CT HEAD WO CONTRAST (5MM)  Result Date: 04/15/2022 CLINICAL DATA:  Mental status change, unknown cause EXAM: CT HEAD WITHOUT CONTRAST TECHNIQUE: Contiguous axial images were obtained from the base of the skull through the vertex without intravenous contrast. RADIATION DOSE REDUCTION: This exam was performed according to the departmental dose-optimization program which includes automated exposure control, adjustment of the mA and/or kV according to patient size and/or use of iterative reconstruction technique. COMPARISON:  CT head 01/27/2022 BRAIN: BRAIN Cerebral ventricle sizes are concordant with the degree of cerebral volume loss. Patchy and confluent areas of decreased attenuation are noted throughout the deep and periventricular white matter of the cerebral hemispheres bilaterally, compatible with chronic microvascular ischemic disease. No evidence of large-territorial acute infarction. No parenchymal hemorrhage. No mass lesion. Vague right frontal sulcal hyperdensity may represent a trace developing subarachnoid hemorrhage (4:40, 2:28). No mass effect or midline shift. No hydrocephalus. Basilar cisterns are patent. Vascular: No hyperdense vessel. Skull: No acute fracture or focal lesion. Sinuses/Orbits: Paranasal sinuses and mastoid air cells are clear. Bilateral lens replacement. Otherwise the orbits are unremarkable. Other: None. IMPRESSION: Vague right frontal sulcal hyperdensity may represent a trace developing subarachnoid hemorrhage. These results will be called to the ordering clinician or representative by the Radiologist Assistant, and communication documented in the PACS or [employer]. Electronically Signed   By: Iven Finn M.D.   On: 04/15/2022 17:25

## 2022-04-15 NOTE — Progress Notes (Signed)
PROGRESS NOTE    Yates Weisgerber  VQQ:[id number] DOB: 02/28/1933 DOA: 04/11/2022 PCP: [name], MD   Brief Narrative:  HPI per Dr. Babs Bertin on 04/11/22 Marcellous Snarski is a 88 y.o. 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    Following history is provided by the patient as well as the patient's son who is present at bedside, in addition to my discussions with the EDP and via chart review.   For the last few months, the patient has been experiencing recurrent dark-colored stool for which she has been evaluated by [facility] gastroenterology and is scheduled for EGD/colonoscopy with Dr. Tarri Glenn As an outpatient on April 18, 2022.  He also has a history of chronic iron deficiency anemia associated baseline hemoglobin 8-10, and has been requiring recurrent blood transfusions as an outpatient.  Specifically, he has required transfusion of 3 units PRBC over the last 9 days, with most recent transfusion occurring on 04-08-22.  In the setting of a history of paroxysmal atrial fibrillation he is chronically anticoagulated on warfarin, noting no additional blood thinners as an outpatient, including no concomitant aspirin use.  In this context, the patient reports that he experienced another episode of dark-colored stool yesterday, in the absence of any bright red blood per rectum.  He is on daily oral iron supplementation as an outpatient.  Denies any associated chest pain,  shortness of breath, palpitations, diaphoresis, nausea, vomiting, dizziness, presyncope, or syncope.  No hemoptysis.  No recent trauma.   Patient's medical history is also notable for kidney cancer status post left nephrectomy in July 2023 with Dr. Lovena Neighbours Of urology at [facility].   Yesterday, the patient noted new onset gross hematuria, stating that he noted some bright red blood in his diaper, which he believed was urinary in source.  He reports associated 1 to 2 days of suprapubic discomfort in the absence of dysuria or change in urinary urgency/frequency.  Denies any recent subjective fever, chills, rigors, or generalized myalgias.  No recent cough.   Urinary history also notable for BPH for which he is on Flomax at home.  He has a history of stage IIIa CKD 6 with baseline creatinine 1.3-1.6, most recent prior serum creatinine data point noted to be 1.33 on 03/27/2022.         ED Course:  Vital signs in the ED were notable for the following: Digamex 99.0; heart rate initially 92, which subsequent decreased into the 80s following interval IV fluids; systolic blood pressures in the 120s to 130s; respiratory rate 18-23; oxygen saturation 94 to 96% on room air.   Labs were notable for the following: CMP notable for the following: Bicarbonate 29, BUN 46 compared to 32 on 03/27/2022, creatinine 1.40, calcium, adjusted for mild hypoalbuminemia noted to be 11.8 relative to most recent prior adjusted calcium level of 11.3 on 03/27/2022, otherwise liver enzymes within normal limits.  High-sensitivity troponin I initially 29, 3 cefatrizine 1025.  CBC notable for white cell count 12,600 with 87% neutrophils, hemoglobin 9.2 associated normocytic/normochromic properties as well as nonelevated RDW, relative to most recent prior hemoglobin value  of 7.8 on 10-23, platelet count 239.  INR 3.2.  Lactic acid 1.4.  Urinalysis associated with a clear yellow sample and notable for 11-20 white blood cells as well as greater  than 50 RBCs.  DRE performed by EDP showed brown-colored stool that was fecal occult blood negative.  Urine culture collected prior to initiation of IV antibiotics.   Imaging and additional notable ED work-up: EKG showed atrial fibrillation with multiple PVCs, rate 89, nonspecific T wave inversion in aVL, no evidence of ST changes, including no evidence of ST elevation.  CT abdomen/pelvis without contrast showed no evidence of acute intra-abdominal or acute intrapelvic process, while showing interval left nephrectomy as well as descending sigmoid and colonic diverticulosis in the absence of evidence of diverticulitis obstruction, perforation, or abscess.  Additionally, CT abdomen/pelvis showed marked prostatic enlargement, as well as suggestion of right will mass.   While in the ED, the following were administered: Protonix 40 mg IV x1, Rocephin, normal symptoms with interval loss.   Subsequently, the patient was admitted for further evaluation management microscopic hematuria in the setting is suspected hemorrhagic cystitis complicated by sepsis, with presentation also notable for clinical suspicion for dehydration.    **Interim History Gastroenterology was consulted for his normocytic anemia and recent FOBT positive and they are recommending holding iron for upcoming bowel prep and scheduling colonoscopy and EGD after warfarin washout when INR is less than 1.7 for elective GI procedures.  Given that he had some hematuria will consider discussing with urology will need to continue monitor his hematuria but his hematuria is improving.  GI recommending continue to follow and scheduling the EGD and colonoscopy once his INR normalizes and they are recommending continue PPI for now.    Patient did have an abnormal CT scan of the abdomen pelvis with questionable rectal mass but this will be evaluated once he goes for colonoscopy.  INR remains elevated so he was given a dose of vitamin K today IV.  He became  more more somnolent and progressively worsened throughout the day more fatigued.  This afternoon he was very difficult to arouse so rapid response was called on them and when I went to see him he was very difficult to arouse.  Stat head CT done showed a small subdural hemorrhage and case was discussed with neurosurgery who felt that we should repeat a CT scan in about 12 hours.  Given his somnolence and altered mental status and MRI was also obtained and because of his fatigue and there was concern about myasthenia crisis so he is transferred to the stepdown unit.  ABG was done and showed that he was hypercarbic so he had to be placed on BiPAP. critical care has been consulted as well as neurology and neurology recommended working the patient up for an acute CVA given that he did have a focal deficit on the left arm given being weak   Assessment and Plan: No notes have been filed under this hospital service. Service: Hospitalist  Acute respiratory failure with hypoxia and hypercapnia likely in the setting of acute on chronic CHF versus myasthenia crisis Somnolence -Patient decompensated and became much more fatigued and difficult to arouse -Stat ABG done and showed a pH of 7.28 with a PCO2 of 70, PO2 of 102, his O2 saturation of 99% -Given his hypercapnia we have transferred him to the ICU stepdown unit and placed him on BiPAP and repeated a ABG which showed improvement -Critical care has been consulted given his worsening fatigue  and given his history of myasthenia gravis we have ordered a forced vital capacity and NIF every 4 -Neurology has been consulted for his myasthenia and concern for myasthenic crisis and they may have to evaluate the patient still  Acute encephalopathy and somnolence  -Altered mental status work-up initiated and he had an ABG done, ammonia level, RPR, vitamin B12 level, TSH -EEG ordered and pending -Head CT done and showed "BRAIN Cerebral ventricle sizes are concordant  with the degree of cerebral volume loss. Patchy and confluent areas of decreased attenuation are noted throughout the deep and periventricular white matter of the cerebral hemispheres bilaterally, compatible with chronic microvascular ischemic disease.   No evidence of large-territorial acute infarction. No parenchymal hemorrhage. No mass lesion. Vague right frontal sulcal hyperdensity may represent a trace developing subarachnoid hemorrhage (4:40, 2:28).   No mass effect or midline shift. No hydrocephalus. Basilar cisterns are patent.   Vascular: No hyperdense vessel.   Skull: No acute fracture or focal lesion.   Sinuses/Orbits: Paranasal sinuses and mastoid air cells are clear. Bilateral lens replacement. Otherwise the orbits are unremarkable.   Other: None.   IMPRESSION: Vague right frontal sulcal hyperdensity may represent a trace developing subarachnoid hemorrhage." -I have consulted neurology who states that they will evaluate the patient at some point -Discussed with neurology about myasthenic crisis and they do not feel that this is likely however will need to monitor carefully -We will order an MRI given that he did have a focal deficit on his left arm being weaker than his right arm when he was extremely confused and somnolent -MRI pending to be done -CTA of the head neck done which showed ". No emergent large vessel occlusion or high-grade stenosis of the intracranial arteries. 2. Bilateral medium-sized pleural effusions and moderate pulmonary edema. 3. Upper mediastinal lymphadenopathy" -Follow clinically and appreciate specialist assistance and neurology evaluation and pulmonary critical care evaluation  Gross hematuria -Patient reports new onset gross hematuria over the last day, although urinalysis is associated with a clear yellow appearing specimen with greater than 50 RBCs, suspicious for hemorrhagic cystitis in the setting of significant pyuria concomitant  with new onset suprapubic discomfort along with positive SIRS criteria.   -Of note, he is chronically anticoagulated on warfarin, with mildly supratherapeutic pubic INR presentation, but otherwise no additional blood thinners as an outpatient.  Hemoglobin consistent with baseline.  Appears hemodynamically stable, and otherwise asymptomatic. - Further evaluation management of suspected hemorrhagic cystitis, including continuation of IV Rocephin, as further detailed below.  Continuous lactated Ringer's.  Monitor on symmetry.  Every 4 hour H&H's ordered through 9 AM on 04/12/2022.   -INR is now 2.5 and gross hematuria is resolved -Repeat CBC in the morning.  Add on iron studies.  Type and screen ordered.   -May consider discussing with urology versus outpatient follow-up in the context of a history of recent left nephrectomy but given that his hematuria is improving we will hold off for now.  Subarachnoid hemorrhage -Likely could have been in the setting of his supratherapeutic INR on admission -Case was discussed personally with neurosurgery who evaluated and may see a small area of T SAH overlying the right convexity -Patient was already given vitamin K earlier this a.m. given that he needs a EGD and colonoscopy -Neurosurgery recommending repeating a head CT scan in the a.m. -If we take the patient off for long-term anticoagulation then neurosurgery is okay with starting prophylactic dose anticoagulation with a small PSA H   Sepsis due  to hemorrhagic cystitis, improving -In the context of new onset suprapubic discomfort, presenting urinalysis with significant pyuria, with SIRS criteria positive via leukocytosis, tachypnea.   -However, no evidence of endorgan damage to meet criteria for severe sepsis, including nonelevated lactic acid, with presenting value of 1.4.   -No evidence of additional underlying infectious process at this time.   -Urine culture collected prior to initiation of Rocephin, which  will be continued for now.  Of note, patient received Rocephin prior to collection of blood cultures. -Continuous IV fluids now stopped.  Follow-up result of urine culture however showed no growth.  Continue IV Rocephin for now -Continue to monitor for signs decidualization; repeat CBC in a.m. -We will get PT and OT to further evaluate and treat given his weakness   Nonsustained V. tach -Had a 10 beat run of V. tach this morning he is asymptomatic -Electrolytes were within normal limits -Continue to to monitor and trend and continue monitor on telemetry   Chronic iron deficiency anemia -In the setting of chronic blood loss, including from suspected subacute upper gastrointestinal bleed, patient has a baseline hemoglobin range of 8-10, and has required transfusion of 3 units PBC over the course the last 9 days.  Ever, presenting hemoglobin found to be within baseline range, trending up from most recent prior value of 7.8 on 04/08/2022.  Given the patient's reported new onset gross hematuria, will closely monitor considering hemoglobin trend via every 4 hour monitoring as further detailed below.  Hemodynamically stable.  Otherwise asymptomatic.   -Anemia panel was done and showed an iron level of 16, U IBC 152, TIBC of 168, saturation ratios of 10%, ferritin level 757 -Every 4 hour H&H's ordered through 9 AM on 04/12/2022.  Hold this evening's dose of warfarin, with repeat INR ordered for the morning.  Add on iron studies.  Monitor on symmetry.  Resume home oral iron via p.o. route.  Further evaluation management of suspected subacute upper GI bleed, as further detailed below.  Further evaluation management of suspected hemorrhagic cystitis, as above. -Hemoglobin/hematocrit went from 9.2/30.3 -> 8.7/27.9 and is now 8.2/27.5 -GI consulted and planning for an EGD and colonoscopy once warfarin is washed out and INR is less than 1.7 -Repeat INR today was 3.1   Subacute upper GIB -Suspected on the basis of  multiple months of intermittent dark-colored stool, with elevated BUN for this, the patient is established as an outpatient with [facility], and is scheduled for EGD/colonoscopy with Dr. Tarri Glenn On 04/18/2022, with corresponding instructions leading up to this endoscopic evaluation that include holding home warfarin for 5 days leading up to the scopes.   -Appears to be a slow and/or intermittent upper gastrointestinal bleed given the dark coloration of the patient's stool.  However, -DRE performed by EDP today revealed brown-colored stool is fecal occult blood negative this admission.   -Of note, CT abdomen/pelvis reveals evidence suggestive of rectal mass, warranting further evaluation, which is planned via colonoscopy scheduled with [facility] gastroenterology on 04/18/2022, as above.  Does not appear to be in overt indication for PRBC transfusion at this time, but will closely monitor ensuing hemoglobin trend as outlined below.   -Chronic prednisone use in the setting of ocular myasthenia gravis noted, with associated increased risk for development of pressure ulcer. -Patient's hemoglobin/hematocrit is now further dropping from 8.7/27.9 is now 8.2/27.5 -Iron panel was checked and showed an iron level of 68, UIBC 152, TIBC 168, saturation ratios of 10%, ferritin level 757 -Continue Every 4 hour  H&H's through 9 AM on 04/12/2022.  Type and screen ordered.  Repeat INR in the morning after holding this evening's dose of warfarin.  Monitor on symmetry.  Patient colonoscopy/EGD with [facility] gastroenterology as scheduled for April 18, 2022 but consulted GI while hospitalized and this will be done while in the hospital now   Hypercalcemia -Presenting adjusted calcium level of 11.8, just slightly higher than most recent prior adjusted value of 11.3 on 03/27/2022.  -Suspect contribution from dehydration given clinical appearance of such.   -Corrected calcium now is still a little elevated given that  his calcium was 10.9 with albumin level 2.7 -No overt additional pharmacologic contribution as an outpatient. -We will provide interval IV fluids to attend to suspected element of dehydration, with consideration for further expansion of evaluation if serum calcium level remains elevated following interval IV fluid resuscitative efforts. -We will be getting IV Lasix now -IV fluids, as above now stopped.  Monitor strict I's and O's and daily weights.  Repeat CMP in the morning.     Dehydration -Clinical suspicion for such, including the appearance of dry oral mucous membranes as well as laboratory findings notable for acute prerenal azotemia.  -Appears to be in the setting of   relative intravascular dilation and stemming from sepsis due to hemorrhagic cystitis superimposed on chronic blood loss anemia, and exacerbated the patient Lasix, which will be held in the context of chronic right-sided systolic heart failure with increased risk for preload dependent pathophysiology.  -Monitor strict I's and O's.  Daily weights.  Repeat BMP in the morning.  IV fluid hydration stopped is given Lasix   Paroxysmal atrial fibrillation -Documented history of such. In setting of CHA2DS2-VASc score of 4, there is an indication for chronic anticoagulation for thromboembolic prophylaxis. Consistent with this, patient is chronically anticoagulated on warfarin, with presenting INR noted to be mildly supratherapeutic.   -In the context subacute upper GI bleed as well as report of new onset gross hematuria, will hold this evening's dose of warfarin, and repeat INR in the morning.  No clinical indication for pharmacologic reversal at this time.. Home AV nodal blocking regimen: None.  Most recent echocardiogram August 2023, with results as further detailed above.  Presenting EKG suggestive rate controlled atrial fibrillation. -Holding home warfarin dose this evening, with plan to repeat INR in the morning and today it was 2.5 so  we will give him a dose of vitamin K IV 5 mg x 1.   -Continue to monitor on telemetry.   Acute on chronic chronic right-sided systolic heart failure -Documented as such, with most recent echocardiogram on 02/14/2022 showing evidence of LVEF 55%, indeterminate diastolic parameters, as well as mildly reduced right ventricular systolic function.  Patient on Lasix 20 mg p.o. daily as an outpatient.  -He was noted to be volume overloaded on chest x-ray sodium IV Lasix and is now -2.218 L -Hold home Lasix.  Monitor strict I's and O's and daily weights.  At [facility] magnesium low-grade repeat CMP in the morning.   -The IV fluid hydration has now stopped -Continue to monitor for signs and symptoms of volume overload -May need to repeat his echocardiogram and a BNP   Essential hypertension -Documented history of such, on Norvasc, Lasix, as an outpatient will also noting home Flomax, with potential antihypertensive implications.   -Normotensive in the ED this evening. -In context of presenting sepsis as well as chronic blood loss, will hold him Norvasc as well as Lasix for now.  Close  very resume blood pressure via routine vital signs.  Monitor strict I's and O's Daily weights. -Continue monitor blood pressures per protocol -Last blood pressure reading was 133/51   Ocular myasthenia's gravis -Document history of such, following with neurology as an outpatient, on chronic prednisone therapy as well as Mestinon.  Inclusion of prednisone notable in the context suspected subacute upper distress of bleed, which increases risk for associated pressure ulcer. -Continue prednisone and Mestinon therapy. -Given concern for myasthenic crisis he was transferred to the ICU and will obtain nips and forced vital capacities every 4   Stage IIIa CKD -Documented history of such, associated baseline creatinine range 1.3-1.6, with contribution from status post left nephrectomy in July 2023.  Presenting serum creatinine found  to be within baseline range and now BUNs/creatinine has gone from 42/1.24 and trended up to 42/1.47 and now trended down and is 34/1.17 -Monitor strict I's and O's Daily weights.   -Avoid further nephrotoxic medications, contrast dyes, hypotension and dehydration to ensure adequate renal perfusion will need to renally adjust medications and repeat CMP in a.m.   Hypoalbuminemia -Albumin level is low and now gone from 2.6 -> 2.4 last few days. -Continue to monitor and trend and repeat CMP in a.m.  DVT prophylaxis: SCDs Start: 04/11/22 2325    Code Status: Partial Code Family Communication: Discussed with the son extensively  Disposition Plan:  Level of care: Stepdown Status is: Inpatient Remains inpatient appropriate because: Transferred to the stepdown unit for further evaluation and placed on BiPAP   Consultants:  PCCM pulmonary Neurology Neurosurgery Gastroenterology  Procedures:  As delineated as above  Antimicrobials:  Anti-infectives (From admission, onward)    Start     Dose/Rate Route Frequency Ordered Stop   04/12/22 1800  cefTRIAXone (ROCEPHIN) 1 g in sodium chloride 0.9 % 100 mL IVPB        1 g 200 mL/hr over 30 Minutes Intravenous Every 24 hours 04/11/22 2329     04/11/22 2300  cefTRIAXone (ROCEPHIN) 1 g in sodium chloride 0.9 % 100 mL IVPB        1 g 200 mL/hr over 30 Minutes Intravenous  Once 04/11/22 2256 04/11/22 2350       Subjective: Seen and examined at bedside he was extremely somnolent difficult to arouse again this afternoon when he was a little bit more awake and alert earlier.  Very difficult to open his eyes on sternal rub but he would answer intermittently.  Stat labs EEG and head CT ordered.  Became a little bit more awake after being placed on BiPAP.  Given IV Lasix by pulmonary.  Son updated.  Objective: Vitals:   04/15/22 1550 04/15/22 1751 04/15/22 1900 04/15/22 2045  BP: (!) 129/50   (!) 133/51  Pulse: 76 83  65  Resp: 16 (!) 22  18   Temp: 98.2 F (36.8 C)  (!) 97.5 F (36.4 C)   TempSrc: Oral  Axillary   SpO2: 99% 97%  99%  Weight:      Height:        Intake/Output Summary (Last 24 hours) at 04/15/2022 2213 Last data filed at 04/15/2022 1910 Gross per 24 hour  Intake 298.24 ml  Output 525 ml  Net -226.76 ml   Filed Weights   04/11/22 1319 04/13/22 0500 04/14/22 0526  Weight: 87.1 kg 90.1 kg 85.9 kg   Examination: Physical Exam:  Constitutional: WN/WD overweight elderly chronically ill-appearing Caucasian male who is extremely somnolent and difficult to arouse Respiratory: Diminished  to auscultation bilaterally with coarse breath sounds, no wheezing, rales, rhonchi or crackles. Normal respiratory effort and patient is not tachypenic. No accessory muscle use.  Unlabored breathing and wearing 2 L of supplemental oxygen via nasal cannula Cardiovascular: RRR, no murmurs / rubs / gallops. S1 and S2 auscultated.  No lower extremity edema Abdomen: Soft, non-tender, mildly distended bowel sounds positive.  GU: Deferred. Musculoskeletal: No clubbing / cyanosis of digits/nails. No joint deformity upper and lower extremities.  Patient is extremely weak and left side is a little bit weaker on the right on testing Skin: No rashes, lesions, ulcers on limited skin evaluation. No induration; Warm and dry.  Neurologic: Numerous trauma and drowsy and does not really follow commands appropriately Psychiatric: Impaired judgment judgment and insight.  He is somnolent and drowsy and very difficult to arouse  Data Reviewed: I have personally reviewed following labs and imaging studies  CBC: Recent Labs  Lab 04/11/22 1411 04/12/22 0504 04/13/22 0413 04/14/22 0445 04/15/22 0351 04/15/22 1708 04/15/22 1856  WBC 12.6* 10.1 10.8* 11.1* 10.0 12.1* 10.9*  NEUTROABS 11.1* 8.3* 8.7* 9.2*  --  10.5*  --   HGB 9.2* 8.7* 8.2* 8.7* 8.9* 8.1* 8.0*  HCT 30.3* 27.9* 27.5* 29.1* 29.6* 27.1* 27.3*  MCV 95.3 95.2 96.5 97.0 96.1 97.5 97.8   PLT 239 202 205 203 210 197 259   Basic Metabolic Panel: Recent Labs  Lab 04/12/22 0504 04/13/22 0413 04/14/22 0445 04/15/22 0351 04/15/22 1708 04/15/22 1856  NA 140 137 137 137 132* 132*  K 4.1 4.2 5.1 5.4* 5.4* 5.3*  CL 105 104 102 102 100 100  CO2 '28 30 30 31 '$ 32 29  GLUCOSE 90 101* 98 107* 126* 143*  BUN 42* 42* 37* 37* 37* 34*  CREATININE 1.24 1.47* 1.17 1.26* 1.29* 1.17  CALCIUM 10.9* 10.9* 11.5* 12.0* 11.3* 11.3*  MG 2.0 2.3 2.2  --  2.3 2.0  PHOS 3.0 3.3 2.7  --  3.5  --    GFR: Estimated Creatinine Clearance: 49.6 mL/min (by C-G formula based on SCr of 1.17 mg/dL). Liver Function Tests: Recent Labs  Lab 04/12/22 0504 04/13/22 0413 04/14/22 0445 04/15/22 1708 04/15/22 1856  AST '17 17 17 19 18  '$ ALT '18 20 21 23 23  '$ ALKPHOS 62 66 68 65 67  BILITOT 0.9 0.8 0.8 0.8 0.8  PROT 6.0* 6.3* 6.4* 6.1* 5.8*  ALBUMIN 2.5* 2.7* 2.6* 2.6* 2.4*   No results for input(s): "LIPASE", "AMYLASE" in the last 168 hours. Recent Labs  Lab 04/15/22 1708  AMMONIA 19   Coagulation Profile: Recent Labs  Lab 04/11/22 1411 04/12/22 0504 04/13/22 1116 04/14/22 0445 04/15/22 0351  INR 3.2* 3.0* 3.1* 2.9* 2.5*   Cardiac Enzymes: No results for input(s): "CKTOTAL", "CKMB", "CKMBINDEX", "TROPONINI" in the last 168 hours. BNP (last 3 results) No results for input(s): "PROBNP" in the last 8760 hours. HbA1C: No results for input(s): "HGBA1C" in the last 72 hours. CBG: Recent Labs  Lab 04/15/22 1708  GLUCAP 132*   Lipid Profile: No results for input(s): "CHOL", "HDL", "LDLCALC", "TRIG", "CHOLHDL", "LDLDIRECT" in the last 72 hours. Thyroid Function Tests: Recent Labs    04/15/22 1708  TSH 1.072   Anemia Panel: Recent Labs    04/15/22 1708  VITAMINB12 424   Sepsis Labs: Recent Labs  Lab 04/11/22 2022 04/15/22 1856  PROCALCITON  --  0.18  LATICACIDVEN 1.4  --     Recent Results (from the past 240 hour(s))  Urine Culture  Status: None   Collection Time:  04/11/22  2:24 PM   Specimen: Urine, Clean Catch  Result Value Ref Range Status   Specimen Description   Final    URINE, CLEAN CATCH Performed at [facility], [name] [address] [place], [place] [address]    Special Requests   Final    NONE Performed at [facility], [place] [address], [place], [place] [address]    Culture   Final    NO GROWTH Performed at [facility], [place] [address] [place], [place] [address]    Report Status 04/12/2022 FINAL  Final  MRSA Next Gen by PCR, Nasal     Status: None   Collection Time: 04/15/22  6:32 PM   Specimen: Nasal Mucosa; Nasal Swab  Result Value Ref Range Status   MRSA by PCR Next Gen NOT DETECTED NOT DETECTED Final    Comment: (NOTE) The GeneXpert MRSA Assay (FDA approved for NASAL specimens only), is one component of a comprehensive MRSA colonization surveillance program. It is not intended to diagnose MRSA infection nor to guide or monitor treatment for MRSA infections. Test performance is not FDA approved in patients less than 71 years old. Performed at [facility], [place] [address] [place], [place] [address]     Radiology Studies: CT ANGIO HEAD NECK W WO CM (CODE STROKE)  Result Date: 04/15/2022 CLINICAL DATA:  Acute neurologic deficit EXAM: CT ANGIOGRAPHY HEAD AND NECK TECHNIQUE: Multidetector CT imaging of the head and neck was performed using the standard protocol during bolus administration of intravenous contrast. Multiplanar CT image reconstructions and MIPs were obtained to evaluate the vascular anatomy. Carotid stenosis measurements (when applicable) are obtained utilizing NASCET criteria, using the distal internal carotid diameter as the denominator. RADIATION DOSE REDUCTION: This exam was performed according to the departmental dose-optimization program which includes automated exposure control, adjustment of the mA and/or kV according to patient size and/or use of  iterative reconstruction technique. CONTRAST:  127m OMNIPAQUE IOHEXOL 350 MG/ML SOLN COMPARISON:  None Available. FINDINGS: CTA NECK FINDINGS SKELETON: There is no bony spinal canal stenosis. No lytic or blastic lesion. OTHER NECK: Normal pharynx, larynx and major salivary glands. No cervical lymphadenopathy. Unremarkable thyroid gland. UPPER CHEST: Bilateral medium-sized pleural effusions and multiple subcentimeter mediastinal lymph nodes. Moderate pulmonary edema. AORTIC ARCH: There is calcific atherosclerosis of the aortic arch. There is no aneurysm, dissection or hemodynamically significant stenosis of the visualized portion of the aorta. Conventional 3 vessel aortic branching pattern. The visualized proximal subclavian arteries are widely patent. RIGHT CAROTID SYSTEM: Normal without aneurysm, dissection or stenosis. LEFT CAROTID SYSTEM: Normal without aneurysm, dissection or stenosis. VERTEBRAL ARTERIES: Left dominant configuration. Both origins are clearly patent. There is no dissection, occlusion or flow-limiting stenosis to the skull base (V1-V3 segments). CTA HEAD FINDINGS POSTERIOR CIRCULATION: --Vertebral arteries: Left vertebral artery terminates in PICA. Otherwise normal. --Inferior cerebellar arteries: Normal. --Basilar artery: Normal. --Superior cerebellar arteries: Normal. --Posterior cerebral arteries (PCA): Normal. ANTERIOR CIRCULATION: --Intracranial internal carotid arteries: Normal. --Anterior cerebral arteries (ACA): Normal. Both A1 segments are present. Patent anterior communicating artery (a-comm). --Middle cerebral arteries (MCA): Normal. VENOUS SINUSES: As permitted by contrast timing, patent. ANATOMIC VARIANTS: None Review of the MIP images confirms the above findings. IMPRESSION: 1. No emergent large vessel occlusion or high-grade stenosis of the intracranial arteries. 2. Bilateral medium-sized pleural effusions and moderate pulmonary edema. 3. Upper mediastinal lymphadenopathy.  Electronically Signed   By: KUlyses JarredM.D.   On:  04/15/2022 20:38   DG CHEST PORT 1 VIEW  Result Date: 04/15/2022 CLINICAL DATA:  141880 SOB (shortness of breath) [id number] EXAM: PORTABLE CHEST 1 VIEW COMPARISON:  Chest x-ray 02/13/2022 FINDINGS: The heart and mediastinal contours are within normal limits. Mitral valve replacement. Aortic calcification. Interval development of patchy airspace opacities most prominent within the right lower lung zone and retrocardiac airspace opacity. No pulmonary edema. Bilateral trace pleural effusions. No pneumothorax. No acute osseous abnormality.  Sternotomy wires are intact. IMPRESSION: 1. Multifocal pneumonia. Followup PA and lateral chest X-ray is recommended in 3-4 weeks following therapy to ensure resolution and exclude underlying malignancy. 2. Bilateral trace pleural effusions. 3.  Aortic Atherosclerosis (ICD10-I70.0). Electronically Signed   By: Iven Finn M.D.   On: 04/15/2022 19:20   [place] EKG SITE RITE  Result Date: 04/15/2022 If Site Rite image not attached, placement could not be confirmed due to current cardiac rhythm.  CT HEAD WO CONTRAST (5MM)  Result Date: 04/15/2022 CLINICAL DATA:  Mental status change, unknown cause EXAM: CT HEAD WITHOUT CONTRAST TECHNIQUE: Contiguous axial images were obtained from the base of the skull through the vertex without intravenous contrast. RADIATION DOSE REDUCTION: This exam was performed according to the departmental dose-optimization program which includes automated exposure control, adjustment of the mA and/or kV according to patient size and/or use of iterative reconstruction technique. COMPARISON:  CT head 01/27/2022 BRAIN: BRAIN Cerebral ventricle sizes are concordant with the degree of cerebral volume loss. Patchy and confluent areas of decreased attenuation are noted throughout the deep and periventricular white matter of the cerebral hemispheres bilaterally, compatible with chronic microvascular ischemic  disease. No evidence of large-territorial acute infarction. No parenchymal hemorrhage. No mass lesion. Vague right frontal sulcal hyperdensity may represent a trace developing subarachnoid hemorrhage (4:40, 2:28). No mass effect or midline shift. No hydrocephalus. Basilar cisterns are patent. Vascular: No hyperdense vessel. Skull: No acute fracture or focal lesion. Sinuses/Orbits: Paranasal sinuses and mastoid air cells are clear. Bilateral lens replacement. Otherwise the orbits are unremarkable. Other: None. IMPRESSION: Vague right frontal sulcal hyperdensity may represent a trace developing subarachnoid hemorrhage. These results will be called to the ordering clinician or representative by the Radiologist Assistant, and communication documented in the PACS or [employer]. Electronically Signed   By: Iven Finn M.D.   On: 04/15/2022 17:25     Scheduled Meds:  Chlorhexidine Gluconate Cloth  6 each Topical Daily   mouth rinse  15 mL Mouth Rinse 4 times per day   pantoprazole (PROTONIX) IV  40 mg Intravenous Q12H   predniSONE  10 mg Oral Daily   pyridostigmine  60 mg Oral Q8H   sodium chloride (PF)       tamsulosin  0.4 mg Oral Daily   Continuous Infusions:  cefTRIAXone (ROCEPHIN)  IV Stopped (04/15/22 2000)   dextrose 5% lactated ringers 10 mL/hr at 04/15/22 1901     LOS: 3 days   The patient is critically ill with multiple organ system failure and requires high complexity decision making for assessment and support and frequent evaluation and titration of therapies and application of advanced monitoring technologies and extensive interpretation of multiple databases  Critical care time: 41 nonconsecutive minutes devoted to this patient care services, described in this note including but not limited to reviewing chart, seeing the patient, coordinating care, updating family and discussing with all the consultants  Raiford Noble, DO [facility] Available via Epic secure chat  7am-7pm After these hours, please refer to coverage provider listed on  [email or web address] 04/15/2022, 10:13 PM

## 2022-04-16 ENCOUNTER — Inpatient Hospital Stay (HOSPITAL_COMMUNITY): Payer: Medicare HMO

## 2022-04-16 ENCOUNTER — Encounter (HOSPITAL_COMMUNITY): Payer: Self-pay | Admitting: Internal Medicine

## 2022-04-16 ENCOUNTER — Inpatient Hospital Stay: Payer: Medicare HMO

## 2022-04-16 DIAGNOSIS — N3091 Cystitis, unspecified with hematuria: Secondary | ICD-10-CM | POA: Diagnosis not present

## 2022-04-16 DIAGNOSIS — R933 Abnormal findings on diagnostic imaging of other parts of digestive tract: Secondary | ICD-10-CM

## 2022-04-16 DIAGNOSIS — J9601 Acute respiratory failure with hypoxia: Secondary | ICD-10-CM | POA: Diagnosis not present

## 2022-04-16 DIAGNOSIS — R3129 Other microscopic hematuria: Secondary | ICD-10-CM | POA: Diagnosis not present

## 2022-04-16 DIAGNOSIS — D509 Iron deficiency anemia, unspecified: Secondary | ICD-10-CM

## 2022-04-16 DIAGNOSIS — E860 Dehydration: Secondary | ICD-10-CM | POA: Diagnosis not present

## 2022-04-16 DIAGNOSIS — N19 Unspecified kidney failure: Secondary | ICD-10-CM | POA: Diagnosis not present

## 2022-04-16 DIAGNOSIS — G9389 Other specified disorders of brain: Secondary | ICD-10-CM

## 2022-04-16 DIAGNOSIS — I428 Other cardiomyopathies: Secondary | ICD-10-CM

## 2022-04-16 LAB — RESPIRATORY PANEL BY PCR

## 2022-04-16 LAB — ECHOCARDIOGRAM COMPLETE
AR max vel: 3.43 cm2
AV Area VTI: 3.38 cm2
AV Area mean vel: 3.14 cm2
AV Mean grad: 4 mmHg
AV Peak grad: 7.5 mmHg
Ao pk vel: 1.37 m/s
Area-P 1/2: 4.06 cm2
Calc EF: 60.4 %
Height: 72.5 in
MV M vel: 5 m/s
MV Peak grad: 99.9 mmHg
P 1/2 time: 680 msec
S' Lateral: 3.5 cm
Single Plane A2C EF: 63.4 %
Single Plane A4C EF: 59 %
Weight: 3030 oz

## 2022-04-16 LAB — COMPREHENSIVE METABOLIC PANEL
ALT: 22 U/L (ref 0–44)
AST: 15 U/L (ref 15–41)
Albumin: 2.3 g/dL — ABNORMAL LOW (ref 3.5–5.0)
Alkaline Phosphatase: 68 U/L (ref 38–126)
Anion gap: 4 — ABNORMAL LOW (ref 5–15)
BUN: 39 mg/dL — ABNORMAL HIGH (ref 8–23)
CO2: 33 mmol/L — ABNORMAL HIGH (ref 22–32)
Calcium: 11.8 mg/dL — ABNORMAL HIGH (ref 8.9–10.3)
Chloride: 99 mmol/L (ref 98–111)
Creatinine, Ser: 1.2 mg/dL (ref 0.61–1.24)
GFR, Estimated: 59 mL/min — ABNORMAL LOW (ref >60)
Glucose, Bld: 108 mg/dL — ABNORMAL HIGH (ref 70–99)
Potassium: 4.9 mmol/L (ref 3.5–5.1)
Sodium: 136 mmol/L (ref 135–145)
Total Bilirubin: 0.8 mg/dL (ref 0.3–1.2)
Total Protein: 5.9 g/dL — ABNORMAL LOW (ref 6.5–8.1)

## 2022-04-16 LAB — PHOSPHORUS: Phosphorus: 3.3 mg/dL (ref 2.5–4.6)

## 2022-04-16 LAB — BLOOD GAS, ARTERIAL
Acid-Base Excess: 8.1 mmol/L — ABNORMAL HIGH (ref 0.0–2.0)
Bicarbonate: 34.3 mmol/L — ABNORMAL HIGH (ref 20.0–28.0)
Delivery systems: POSITIVE
Drawn by: 23281
Expiratory PAP: 8 cmH2O
FIO2: 30 %
Inspiratory PAP: 18 cmH2O
O2 Saturation: 99.1 %
Patient temperature: 36.6
pCO2 arterial: 57 mmHg — ABNORMAL HIGH (ref 32–48)
pH, Arterial: 7.39 (ref 7.35–7.45)
pO2, Arterial: 112 mmHg — ABNORMAL HIGH (ref 83–108)

## 2022-04-16 LAB — CBC WITH DIFFERENTIAL/PLATELET
Abs Immature Granulocytes: 0.05 10*3/uL (ref 0.00–0.07)
Basophils Absolute: 0 10*3/uL (ref 0.0–0.1)
Basophils Relative: 0 %
Eosinophils Absolute: 0.1 10*3/uL (ref 0.0–0.5)
Eosinophils Relative: 1 %
HCT: 25.6 % — ABNORMAL LOW (ref 39.0–52.0)
Hemoglobin: 7.6 g/dL — ABNORMAL LOW (ref 13.0–17.0)
Immature Granulocytes: 1 %
Lymphocytes Relative: 10 %
Lymphs Abs: 0.9 10*3/uL (ref 0.7–4.0)
MCH: 28.9 pg (ref 26.0–34.0)
MCHC: 29.7 g/dL — ABNORMAL LOW (ref 30.0–36.0)
MCV: 97.3 fL (ref 80.0–100.0)
Monocytes Absolute: 0.6 10*3/uL (ref 0.1–1.0)
Monocytes Relative: 6 %
Neutro Abs: 7.6 10*3/uL (ref 1.7–7.7)
Neutrophils Relative %: 82 %
Platelets: 198 10*3/uL (ref 150–400)
RBC: 2.63 MIL/uL — ABNORMAL LOW (ref 4.22–5.81)
RDW: 14.6 % (ref 11.5–15.5)
WBC: 9.3 10*3/uL (ref 4.0–10.5)
nRBC: 0 % (ref 0.0–0.2)

## 2022-04-16 LAB — RAPID URINE DRUG SCREEN, HOSP PERFORMED
Amphetamines: NOT DETECTED
Barbiturates: NOT DETECTED
Benzodiazepines: NOT DETECTED
Cocaine: NOT DETECTED
Opiates: NOT DETECTED
Tetrahydrocannabinol: NOT DETECTED

## 2022-04-16 LAB — STREP PNEUMONIAE URINARY ANTIGEN: Strep Pneumo Urinary Antigen: NEGATIVE

## 2022-04-16 LAB — GLUCOSE, CAPILLARY
Glucose-Capillary: 99 mg/dL (ref 70–99)
Glucose-Capillary: 107 mg/dL — ABNORMAL HIGH (ref 70–99)

## 2022-04-16 LAB — PROTIME-INR
INR: 1.5 — ABNORMAL HIGH (ref 0.8–1.2)
Prothrombin Time: 17.7 seconds — ABNORMAL HIGH (ref 11.4–15.2)

## 2022-04-16 LAB — RPR: RPR Ser Ql: NONREACTIVE

## 2022-04-16 LAB — PROCALCITONIN: Procalcitonin: 0.27 ng/mL

## 2022-04-16 LAB — MAGNESIUM: Magnesium: 2.2 mg/dL (ref 1.7–2.4)

## 2022-04-16 LAB — CALCIUM, IONIZED: Calcium, Ionized, Serum: 7.1 mg/dL — ABNORMAL HIGH (ref 4.5–5.6)

## 2022-04-16 LAB — BRAIN NATRIURETIC PEPTIDE: B Natriuretic Peptide: 313.1 pg/mL — ABNORMAL HIGH (ref 0.0–100.0)

## 2022-04-16 LAB — TROPONIN I (HIGH SENSITIVITY): Troponin I (High Sensitivity): 67 ng/L — ABNORMAL HIGH (ref <18)

## 2022-04-16 MED ORDER — FUROSEMIDE 10 MG/ML IJ SOLN
40.0000 mg | Freq: Once | INTRAMUSCULAR | Status: AC
  Administered 2022-04-16: 40 mg via INTRAVENOUS
  Filled 2022-04-16: qty 4

## 2022-04-16 MED ORDER — SODIUM CHLORIDE 0.9 % IV SOLN
2.0000 g | Freq: Two times a day (BID) | INTRAVENOUS | Status: DC
  Administered 2022-04-16 – 2022-04-17 (×3): 2 g via INTRAVENOUS
  Filled 2022-04-16 (×3): qty 12.5

## 2022-04-16 MED ORDER — FINASTERIDE 5 MG PO TABS
5.0000 mg | ORAL_TABLET | Freq: Every day | ORAL | Status: DC
  Administered 2022-04-16 – 2022-04-30 (×15): 5 mg via ORAL
  Filled 2022-04-16 (×15): qty 1

## 2022-04-16 MED ORDER — PYRIDOSTIGMINE BROMIDE 60 MG PO TABS
60.0000 mg | ORAL_TABLET | Freq: Four times a day (QID) | ORAL | Status: DC
  Administered 2022-04-16 – 2022-04-30 (×54): 60 mg via ORAL
  Filled 2022-04-16 (×61): qty 1

## 2022-04-16 MED ORDER — FUROSEMIDE 10 MG/ML IJ SOLN
40.0000 mg | Freq: Three times a day (TID) | INTRAMUSCULAR | Status: DC
  Administered 2022-04-16 – 2022-04-17 (×4): 40 mg via INTRAVENOUS
  Filled 2022-04-16 (×4): qty 4

## 2022-04-16 MED ORDER — PYRIDOSTIGMINE BROMIDE 60 MG PO TABS
60.0000 mg | ORAL_TABLET | Freq: Three times a day (TID) | ORAL | Status: DC
  Filled 2022-04-16: qty 1

## 2022-04-16 MED ORDER — PYRIDOSTIGMINE BROMIDE 10 MG/2ML IV SOLN
2.0000 mg | Freq: Three times a day (TID) | INTRAVENOUS | Status: DC
  Filled 2022-04-16: qty 0.4

## 2022-04-16 MED ORDER — PYRIDOSTIGMINE BROMIDE 10 MG/2ML IV SOLN
2.0000 mg | Freq: Four times a day (QID) | INTRAVENOUS | Status: DC
  Administered 2022-04-16 – 2022-04-21 (×5): 2 mg via INTRAVENOUS
  Filled 2022-04-16 (×40): qty 0.4

## 2022-04-16 NOTE — Progress Notes (Signed)
   Patient Name: Daniel Sharp Date of Encounter: 04/16/2022, 4:02 PM    Subjective  Overall status better after BIPAP, he remains confused   Objective  BP 134/63   Pulse 78   Temp 98.3 F (36.8 C) (Oral)   Resp (!) 28   Ht 6' 0.5" (1.842 m)   Wt 85.9 kg   SpO2 98%   BMI 25.33 kg/m      Assessment and Plan  Anemia ? 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 Unsedated flex sig to evaluste rectal abnormality on CT Goals of care??    Gatha Mayer, MD, [facility] See Shea Evans on call - gastroenterology for best contact person 04/16/2022 4:02 PM

## 2022-04-16 NOTE — Progress Notes (Signed)
Pt gave great effort with NIF/FVC.    NIF: -40 FVC: 0.8L  Results are best of 3 attempts each.

## 2022-04-16 NOTE — Progress Notes (Signed)
NIF (best of 3 attempts)= -40  FVC (best of 3 attempts)= 0.8L

## 2022-04-16 NOTE — Progress Notes (Addendum)
PROGRESS NOTE    Daniel Sharp  TKP:[id number] DOB: 10/01/1932 DOA: 04/11/2022 PCP: [name], MD   HPI per Dr. Babs Bertin on 04/11/22 Daniel Sharp is a 88 y.o. 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    Following history is provided by the patient as well as the patient's son who is present at bedside, in addition to my discussions with the EDP and via chart review.   For the last few months, the patient has been experiencing recurrent dark-colored stool for which she has been evaluated by [facility] gastroenterology and is scheduled for EGD/colonoscopy with Dr. Tarri Glenn As an outpatient on April 18, 2022.  He also has a history of chronic iron deficiency anemia associated baseline hemoglobin 8-10, and has been requiring recurrent blood transfusions as an outpatient.  Specifically, he has required transfusion of 3 units PRBC over the last 9 days, with most recent transfusion occurring on 04-08-22.  In the setting of a history of paroxysmal atrial fibrillation he is chronically anticoagulated on warfarin, noting no additional blood thinners as an outpatient, including no concomitant aspirin use.  In this context, the patient reports that he experienced another episode of dark-colored stool yesterday, in the absence of any bright red blood per rectum.  He is on daily oral iron supplementation as an outpatient.  Denies any associated chest pain, shortness of breath,  palpitations, diaphoresis, nausea, vomiting, dizziness, presyncope, or syncope.  No hemoptysis.  No recent trauma.   Patient's medical history is also notable for kidney cancer status post left nephrectomy in July 2023 with Dr. Lovena Neighbours Of urology at [facility].   Yesterday, the patient noted new onset gross hematuria, stating that he noted some bright red blood in his diaper, which he believed was urinary in source.  He reports associated 1 to 2 days of suprapubic discomfort in the absence of dysuria or change in urinary urgency/frequency.  Denies any recent subjective fever, chills, rigors, or generalized myalgias.  No recent cough.   Urinary history also notable for BPH for which he is on Flomax at home.  He has a history of stage IIIa CKD 6 with baseline creatinine 1.3-1.6, most recent prior serum creatinine data point noted to be 1.33 on 03/27/2022.   ED Course:  Vital signs in the ED were notable for the following: Digamex 99.0; heart rate initially 92, which subsequent decreased into the 80s following interval IV fluids; systolic blood pressures in the 120s to 130s; respiratory rate 18-23; oxygen saturation 94 to 96% on room air.   Labs were notable for the following: CMP notable for the following: Bicarbonate 29, BUN 46 compared to 32 on 03/27/2022, creatinine 1.40, calcium, adjusted for mild hypoalbuminemia noted to be 11.8 relative to most recent prior adjusted calcium level of 11.3 on 03/27/2022, otherwise liver enzymes within normal limits.  High-sensitivity troponin I initially 29, 3 cefatrizine 1025.  CBC notable for white cell count 12,600 with 87% neutrophils, hemoglobin 9.2 associated normocytic/normochromic properties as well as nonelevated RDW, relative to most recent prior hemoglobin value of 7.8 on 10-23, platelet count 239.  INR  3.2.  Lactic acid 1.4.  Urinalysis associated with a clear yellow sample and notable for 11-20 white blood cells as well as greater than 50 RBCs.  DRE performed  by EDP showed brown-colored stool that was fecal occult blood negative.  Urine culture collected prior to initiation of IV antibiotics.   Imaging and additional notable ED work-up: EKG showed atrial fibrillation with multiple PVCs, rate 89, nonspecific T wave inversion in aVL, no evidence of ST changes, including no evidence of ST elevation.  CT abdomen/pelvis without contrast showed no evidence of acute intra-abdominal or acute intrapelvic process, while showing interval left nephrectomy as well as descending sigmoid and colonic diverticulosis in the absence of evidence of diverticulitis obstruction, perforation, or abscess.  Additionally, CT abdomen/pelvis showed marked prostatic enlargement, as well as suggestion of right will mass.   While in the ED, the following were administered: Protonix 40 mg IV x1, Rocephin, normal symptoms with interval loss.   Subsequently, the patient was admitted for further evaluation management microscopic hematuria in the setting is suspected hemorrhagic cystitis complicated by sepsis, with presentation also notable for clinical suspicion for dehydration.    **Interim History Gastroenterology was consulted for his normocytic anemia and recent FOBT positive and they are recommending holding iron for upcoming bowel prep and scheduling colonoscopy and EGD after warfarin washout when INR is less than 1.7 for elective GI procedures.  Given that he had some hematuria will consider discussing with urology will need to continue monitor his hematuria but his hematuria is improving.  GI recommending continue to follow and scheduling the EGD and colonoscopy once his INR normalizes and they are recommending continue PPI for now.    Patient did have an abnormal CT scan of the abdomen pelvis with questionable rectal mass but this will be evaluated once he goes for colonoscopy.   INR remains elevated so he was given a dose of vitamin K today IV.  He became more more somnolent and  progressively worsened throughout the day more fatigued.  This afternoon he was very difficult to arouse so rapid response was called on them and when I went to see him he was very difficult to arouse.  Stat head CT done showed a small subdural hemorrhage and case was discussed with neurosurgery who felt that we should repeat a CT scan in about 12 hours.  Given his somnolence and altered mental status and MRI was also obtained and because of his fatigue and there was concern about myasthenia crisis so he is transferred to the stepdown unit.  ABG was done and showed that he was hypercarbic so he had to be placed on BiPAP. critical care has been consulted as well as neurology and neurology recommended working the patient up for an acute CVA given that he did have a focal deficit on the left arm given being weak  Repeat head scan done and showed "Trace acute subarachnoid hemorrhage overlying the posterior right frontal lobe, unchanged from the brain MRI performed earlier today. Redemonstrated 13 x 4 mm dural-based mass overlying the mid right frontal lobe, likely reflecting a meningioma. No significant mass effect upon the underlying brain parenchyma. Background cerebral atrophy and chronic small vessel disease, stable."  MRI done and showed "Small-volume acute subarachnoid hemorrhage overlying the posterior right frontal lobe, unchanged from the prior head CT of 04/15/2022. 13 x 4 mm dural-based mass overlying the right frontal lobe, likely reflecting a meningioma. No significant mass effect upon the underlying brain parenchyma. Chronic lacunar infarcts in  the bilateral cerebral hemispheric white matter. Background mild cerebral white matter and pontine chronic small vessel ischemic disease. 3-4 mm lesion within the anterior right frontal lobe, which may reflect a cavernoma or chronic microhemorrhage. Several chronic  microhemorrhages scattered elsewhere within the supratentorial and infratentorial brain.  Mild-to-moderate generalized cerebral atrophy. Mild cerebellar atrophy."  Given his respiratory decompensation pulmonary recommended obtaining a CT scan of the chest without contrast and this was done and also a respiratory virus panel which was negative.  Chest CT scan done and showed "Moderate bilateral pleural effusions and associated atelectasis or consolidation, increased when compared to the lung bases on examination dated 04/11/2022. Diffuse bilateral bronchial wall thickening, ground-glass airspace opacity and interseptal lobular thickening, most consistent with pulmonary edema. Cardiomegaly and coronary artery disease. Enlargement of the main pulmonary artery, as can be seen in pulmonary hypertension."  Because he is found to have moderate bilateral pleural effusions his diuresis was increased and echocardiogram was obtained.  Patient also started having some hematuria again so urology was consulted and they recommended a CT hematuria scan protocol and this is pending.  GI evaluated again and they feel that he may not be a candidate for endoscopic procedures with sedation and given his abnormal abdominal CT scan they may try it and and sedated flex sig to evaluate the rectal abnormality.  Assessment and Plan:   Acute respiratory failure with hypoxia and hypercapnia likely in the setting of acute on chronic CHF versus myasthenia crisis Somnolence -Patient decompensated and became much more fatigued and difficult to arouse on 04/15/2022 so he transferred to this Unit and Placed on BiPAP -Stat ABG done and showed a pH of 7.28 with a PCO2 of 70, PO2 of 102, his O2 saturation of 99% -Given his hypercapnia we have transferred him to the ICU stepdown unit and placed him on BiPAP and repeated a ABG which showed improvement -Critical care has been consulted given his worsening fatigue and given his history of myasthenia gravis we have ordered a forced vital capacity and NIF every 4 -Neurology has been  consulted for his myasthenia and they do not feel that he has a myasthenic compromise at this time but he did have some concern for mild myasthenia gravis exacerbation given that he did have mild weakness of neck flexion extension and so they are recommending SNF and vital capacity every 4 hours and elective intubation for respiratory compromise if all capacity falls below 15 to 20 mg/kg or if it falls below -20 cm per water and the patient has poor effort and not sure an ABG to assess for CO2 -They are recommending Mestinon p.o. 60 mg 3 times daily but he has not been placed on 2 mg IV 4 times daily or 60 mg p.o. 4 times daily -Diuresis has been increased from 40 mg once daily to 40 mg every 8 hours given his CT scan findings -Respiratory virus panel negative -Appreciate pulmonary evaluation recommendations and given that his hypercarbia is improved they are still recommending BiPAP nightly and as needed and they feel that he is a low threshold to intubate given his myasthenia gravis -Pulmonary feels that he is acute on chronic diastolic CHF and so have increased his diuresis based on the CT scan findings -CT scan of the chest done and showed "Moderate bilateral pleural effusions and associated atelectasis or consolidation, increased when compared to the lung bases on examination dated 04/11/2022. Diffuse bilateral bronchial wall thickening, ground-glass airspace opacity and interseptal lobular thickening, most consistent with  pulmonary edema. Cardiomegaly and coronary artery disease. Enlargement of the main pulmonary artery, as can be seen in pulmonary hypertension."  -ECHO done and showed EF of 55% with indeterminate left diastolic parameters and left concentric ventricular hypertrophy and Similar MR; has had intermittent atrial fibrillation through this study.  -Pulmonary has now added antibiotics for possible HCAP to   Acute encephalopathy and somnolence  -Altered mental status work-up initiated and  he had an ABG done, ammonia level, RPR, vitamin B12 level, TSH -EEG ordered and pending -Head CT done and showed "BRAIN Cerebral ventricle sizes are concordant with the degree of cerebral volume loss. Patchy and confluent areas of decreased attenuation are noted throughout the deep and periventricular white matter of the cerebral hemispheres bilaterally, compatible with chronic microvascular ischemic disease. No evidence of large-territorial acute infarction. No parenchymal hemorrhage. No mass lesion. Vague right frontal sulcal hyperdensity may represent a trace developing subarachnoid hemorrhage (4:40, 2:28)   No mass effect or midline shift. No hydrocephalus. Basilar cisterns are patent.   Vascular: No hyperdense vessel.   Skull: No acute fracture or focal lesion.   Sinuses/Orbits: Paranasal sinuses and mastoid air cells are clear. Bilateral lens replacement. Otherwise the orbits are unremarkable.   Other: None.   IMPRESSION: Vague right frontal sulcal hyperdensity may represent a trace developing subarachnoid hemorrhage." -I have consulted neurology who states that they will evaluate the patient at some point -Discussed with neurology about myasthenic crisis and they do not feel that this is likely however will need to monitor carefully -We will order an MRI given that he did have a focal deficit on his left arm being weaker than his right arm when he was extremely confused and somnolent -MRI pending to be done -CTA of the head neck done which showed ". No emergent large vessel occlusion or high-grade stenosis of the intracranial arteries. 2. Bilateral medium-sized pleural effusions and moderate pulmonary edema. 3. Upper mediastinal lymphadenopathy" -Follow clinically and appreciate specialist assistance and neurology evaluation and pulmonary critical care evaluation  -Repeat CT Scan done and showed ""Trace acute subarachnoid hemorrhage overlying the posterior right frontal lobe,  unchanged from the brain MRI performed earlier today. Redemonstrated 13 x 4 mm dural-based mass overlying the mid right frontal lobe, likely reflecting a meningioma. No significant mass effect upon the underlying brain parenchyma. Background cerebral atrophy and chronic small vessel disease, stable." -MRI Done and showed "Small-volume acute subarachnoid hemorrhage overlying the posterior right frontal lobe, unchanged from the prior head CT of 04/15/2022. 13 x 4 mm dural-based mass overlying the right frontal lobe, likely reflecting a meningioma. No significant mass effect upon the underlying brain parenchyma. Chronic lacunar infarcts in the bilateral cerebral hemispheric white matter. Background mild cerebral white matter and pontine chronic small vessel ischemic disease. 3-4 mm lesion within the anterior right frontal lobe, which may reflect a cavernoma or chronic microhemorrhage. Several chronic  microhemorrhages scattered elsewhere within the supratentorial and infratentorial brain. Mild-to-moderate generalized cerebral atrophy. Mild cerebellar atrophy."   Brain mass -Suspected to be a meningioma -Noted on today's MRI we will need to discuss with neurosurgery Dr. Venetia Constable again in the AM -We will get palliative care involvement   Gross hematuria -Patient reports new onset gross hematuria over the last day, although urinalysis is associated with a clear yellow appearing specimen with greater than 50 RBCs, suspicious for hemorrhagic cystitis in the setting of significant pyuria concomitant with new onset suprapubic discomfort along with positive SIRS criteria.   -Of note, he is chronically  anticoagulated on warfarin, with mildly supratherapeutic pubic INR presentation, but otherwise no additional blood thinners as an outpatient.  Hemoglobin consistent with baseline.  Appears hemodynamically stable, and otherwise asymptomatic. - Further evaluation management of suspected hemorrhagic cystitis, including  continuation of IV Rocephin, as further detailed below.  Continuous lactated Ringer's.  Monitor on symmetry.  Every 4 hour H&H's ordered through 9 AM on 04/12/2022.   -INR is now 2.5 and gross hematuria is resolved -Repeat CBC in the morning.  Add on iron studies.  Type and screen ordered.   -Given that he had recurrent hematuria and he has a recent history of a left nephrectomy we will obtain a CT hematuria protocol and consult urology alcohol and now urology recommending holding off against CT hematuria protocol and have initiated finasteride and recommending obtaining if he has hematuria again given that he improved by the time the urologist saw him   Subarachnoid hemorrhage -Likely could have been in the setting of his supratherapeutic INR on admission -Case was discussed personally with neurosurgery who evaluated and may see a small area of T SAH overlying the right convexity -Patient was already given vitamin K earlier this a.m. given that he needs a EGD and colonoscopy -Neurosurgery recommending repeating a head CT scan in the a.m. -If we take the patient off for long-term anticoagulation then neurosurgery is okay with starting prophylactic dose anticoagulation with a small PSA H -See above   Sepsis due to hemorrhagic cystitis, improving -In the context of new onset suprapubic discomfort, presenting urinalysis with significant pyuria, with SIRS criteria positive via leukocytosis, tachypnea.   -However, no evidence of endorgan damage to meet criteria for severe sepsis, including nonelevated lactic acid, with presenting value of 1.4.   -No evidence of additional underlying infectious process at this time.   -Urine culture collected prior to initiation of Rocephin, which will be continued for now.  Of note, patient received Rocephin prior to collection of blood cultures. -Continuous IV fluids now stopped.  Follow-up result of urine culture however showed no growth.  Continue IV Rocephin for  now -Continue to monitor for signs decidualization; repeat CBC in a.m. -We will get PT and OT to further evaluate and treat given his weakness   Nonsustained V. tach -Had a 10 beat run of V. tach this morning he is asymptomatic -Electrolytes were within normal limits -Continue to to monitor and trend and continue monitor on telemetry   Chronic iron deficiency anemia -In the setting of chronic blood loss, including from suspected subacute upper gastrointestinal bleed, patient has a baseline hemoglobin range of 8-10, and has required transfusion of 3 units PBC over the course the last 9 days.  Ever, presenting hemoglobin found to be within baseline range, trending up from most recent prior value of 7.8 on 04/08/2022.  Given the patient's reported new onset gross hematuria, will closely monitor considering hemoglobin trend via every 4 hour monitoring as further detailed below.  Hemodynamically stable.  Otherwise asymptomatic.   -Anemia panel was done and showed an iron level of 16, U IBC 152, TIBC of 168, saturation ratios of 10%, ferritin level 757 -Every 4 hour H&H's ordered through 9 AM on 04/12/2022.  Hold this evening's dose of warfarin, with repeat INR ordered for the morning.  Add on iron studies.  Monitor on symmetry.  Resume home oral iron via p.o. route.  Further evaluation management of suspected subacute upper GI bleed, as further detailed below.  Further evaluation management of suspected hemorrhagic cystitis, as  above. -Hemoglobin/hematocrit went from 9.2/30.3 -> 8.7/27.9 and is now 8.2/27.5 yesterday and trended down to 7.6/25.6 -GI consulted and planning for an EGD and colonoscopy once warfarin is washed out and INR is less than 1.7 -Repeat INR today was 3.1   Subacute upper GIB Rectal abnormality on CT scan -Suspected on the basis of multiple months of intermittent dark-colored stool, with elevated BUN for this, the patient is established as an outpatient with [place] gastroenterology,  and is scheduled for EGD/colonoscopy with Dr. Tarri Glenn On 04/18/2022, with corresponding instructions leading up to this endoscopic evaluation that include holding home warfarin for 5 days leading up to the scopes.   -Appears to be a slow and/or intermittent upper gastrointestinal bleed given the dark coloration of the patient's stool.  However, -DRE performed by EDP today revealed brown-colored stool is fecal occult blood negative this admission.   -Of note, CT abdomen/pelvis reveals evidence suggestive of rectal mass, warranting further evaluation, which is planned via colonoscopy scheduled with [facility] gastroenterology on 04/18/2022, as above.  Does not appear to be in overt indication for PRBC transfusion at this time, but will closely monitor ensuing hemoglobin trend as outlined below.   -Chronic prednisone use in the setting of ocular myasthenia gravis noted, with associated increased risk for development of pressure ulcer. -Patient's hemoglobin/hematocrit is now further dropping from 8.7/27.9 is now 8.2/27.5 is now 7.6/25.6 -Iron panel was checked and showed an iron level of 68, UIBC 152, TIBC 168, saturation ratios of 10%, ferritin level 757 -Repeat INR is now 1.5 and GI consulted and recommending that he may not be a candidate for EGD and colonoscopy and they may need to do a flex sig without anesthesia and recommending palliative care goals of care discussion   Hypercalcemia -Is now 11.8 with an albumin of 2.3 which is likely higher -We will be getting IV Lasix now -IV fluids, as above now stopped.  Monitor strict I's and O's and daily weights.  Repeat CMP in the morning.     Dehydration -Clinical suspicion for such, including the appearance of dry oral mucous membranes as well as laboratory findings notable for acute prerenal azotemia.  -Appears to be in the setting of   relative intravascular dilation and stemming from sepsis due to hemorrhagic cystitis superimposed on chronic blood loss  anemia, and exacerbated the patient Lasix, which will be held in the context of chronic right-sided systolic heart failure with increased risk for preload dependent pathophysiology.  -Monitor strict I's and O's.  Daily weights.  Repeat BMP in the morning.  IV fluid hydration stopped is given Lasix   Paroxysmal atrial fibrillation -Documented history of such. In setting of CHA2DS2-VASc score of 4, there is an indication for chronic anticoagulation for thromboembolic prophylaxis. Consistent with this, patient is chronically anticoagulated on warfarin, with presenting INR noted to be mildly supratherapeutic.   -In the context subacute upper GI bleed as well as report of new onset gross hematuria, will hold this evening's dose of warfarin, and repeat INR in the morning.  No clinical indication for pharmacologic reversal at this time.. Home AV nodal blocking regimen: None.  Most recent echocardiogram August 2023, with results as further detailed above.  Presenting EKG suggestive rate controlled atrial fibrillation. -Holding home warfarin dose this evening, with plan to repeat INR in the morning and today it was 2.5 so we will give him a dose of vitamin K IV 5 mg x 1 yesterday and is now improved his PT/INR.   -Continue to  monitor on telemetry.   Acute on chronic chronic right-sided systolic heart failure -Documented as such, with most recent echocardiogram on 02/14/2022 showing evidence of LVEF 55%, indeterminate diastolic parameters, as well as mildly reduced right ventricular systolic function.  Patient on Lasix 20 mg p.o. daily as an outpatient.  -He was noted to be volume overloaded on chest x-ray sodium IV Lasix and is now - 4.633 L -Hold home Lasix.  Monitor strict I's and O's and daily weights.  At [facility] magnesium low-grade repeat CMP in the morning.   -The IV fluid hydration has now stopped -Continue to monitor for signs and symptoms of volume overload -May need to repeat his echocardiogram and a  BNP   Essential hypertension -Documented history of such, on Norvasc, Lasix, as an outpatient will also noting home Flomax, with potential antihypertensive implications.   -Normotensive in the ED this evening. -In context of presenting sepsis as well as chronic blood loss, will hold him Norvasc as well as Lasix for now.  Close very resume blood pressure via routine vital signs.  Monitor strict I's and O's Daily weights. -Continue monitor blood pressures per protocol -Last blood pressure reading was 113/49   Ocular myasthenia's gravis -Document history of such, following with neurology as an outpatient, on chronic prednisone therapy as well as Mestinon.  Inclusion of prednisone notable in the context suspected subacute upper distress of bleed, which increases risk for associated pressure ulcer. -Continue prednisone and Mestinon therapy. 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  Presenting serum creatinine found to be within baseline range and now BUNs/creatinine has gone from 42/1.24 and trended up to 42/1.47 and now trended down and is 34/1.17 yesterday and today is now 39/1.20 -Monitor strict I's and O's Daily weights.   -Avoid further nephrotoxic medications, contrast dyes, hypotension and dehydration to ensure adequate renal perfusion will need to renally adjust medications and repeat CMP in a.m.   Hypoalbuminemia -Albumin level is low and now gone from 2.6 -> 2.4 last few days and repeat was 2.3. -Continue to monitor and trend and repeat CMP in a.m.  DVT prophylaxis: SCDs Start: 04/11/22 2325    Code Status: Partial  Code Family Communication: Discussed with son at bedside  Disposition Plan:  Level of care: Stepdown Status is: Inpatient Remains inpatient appropriate because: Has multiple medical issues and is getting diuresed by pulmonary and evaluated by the specialist   Consultants:  Neurology Gastroenterology PCCM pulmonary Neurosurgery Urology Palliative care medicine  Procedures:  As delineated as above  Antimicrobials:  Anti-infectives (From admission, onward)    Start     Dose/Rate Route Frequency Ordered Stop   04/16/22 1115  ceFEPIme (MAXIPIME) 2 g in sodium chloride 0.9 % 100 mL IVPB        2 g 200 mL/hr over 30 Minutes Intravenous Every 12 hours 04/16/22 1018     04/12/22 1800  cefTRIAXone (ROCEPHIN) 1 g in sodium chloride 0.9 % 100 mL IVPB  Status:  Discontinued        1 g 200 mL/hr over 30 Minutes Intravenous Every 24 hours 04/11/22 2329 04/16/22 0947   04/11/22 2300  cefTRIAXone (ROCEPHIN) 1 g  in sodium chloride 0.9 % 100 mL IVPB        1 g 200 mL/hr over 30 Minutes Intravenous  Once 04/11/22 2256 04/11/22 2350        Subjective: Seen and examined at bedside he is much more awake and alert and interactive today compared to yesterday.  He is awake and laughing.  Respiratory status appears stable.  Denies any chest pain or shortness of breath.  Objective: Vitals:   04/16/22 1400 04/16/22 1600 04/16/22 1623 04/16/22 1800  BP: 134/63 138/79  (!) 113/49  Pulse: 78 (!) 104  69  Resp: (!) 28 (!) 26  15  Temp:   (!) 97.5 F (36.4 C)   TempSrc:   Oral   SpO2: 98% 99%  100%  Weight:      Height:        Intake/Output Summary (Last 24 hours) at 04/16/2022 1914 Last data filed at 04/16/2022 1800 Gross per 24 hour  Intake 285.59 ml  Output 3750 ml  Net -3464.41 ml   Filed Weights   04/11/22 1319 04/13/22 0500 04/14/22 0526  Weight: 87.1 kg 90.1 kg 85.9 kg    Examination: Physical Exam:  Constitutional: WN/WD overweight elderly Caucasian male currently no acute  distress Respiratory: Diminished to auscultation bilaterally with coarse breath sounds and some crackles, no wheezing, rales, rhonchi. Normal respiratory effort and patient is not tachypenic. No accessory muscle use.  Unlabored breathing wearing 3 to 4 L supplemental oxygen Cardiovascular: Irregularly irregular, no murmurs / rubs / gallops. S1 and S2 auscultated.  Mild 1+ lower extremity edema Abdomen: Soft, non-tender, distended secondary body habitus. Bowel sounds positive.  GU: Deferred. Musculoskeletal: No clubbing / cyanosis of digits/nails. No joint deformity upper and lower extremities.  Skin: No rashes, lesions, ulcers on limited skin evaluation. No induration; Warm and dry.  Neurologic: CN 2-12 grossly intact with no focal deficits. Romberg sign and cerebellar reflexes not assessed.  Psychiatric: Normal judgment and insight.  Is much more awake and alert today  Data Reviewed: I have personally reviewed following labs and imaging studies  CBC: Recent Labs  Lab 04/12/22 0504 04/13/22 0413 04/14/22 0445 04/15/22 0351 04/15/22 1708 04/15/22 1856 04/16/22 0239  WBC 10.1 10.8* 11.1* 10.0 12.1* 10.9* 9.3  NEUTROABS 8.3* 8.7* 9.2*  --  10.5*  --  7.6  HGB 8.7* 8.2* 8.7* 8.9* 8.1* 8.0* 7.6*  HCT 27.9* 27.5* 29.1* 29.6* 27.1* 27.3* 25.6*  MCV 95.2 96.5 97.0 96.1 97.5 97.8 97.3  PLT 202 205 203 210 197 186 854   Basic Metabolic Panel: Recent Labs  Lab 04/12/22 0504 04/13/22 0413 04/14/22 0445 04/15/22 0351 04/15/22 1708 04/15/22 1856 04/16/22 0239  NA 140 137 137 137 132* 132* 136  K 4.1 4.2 5.1 5.4* 5.4* 5.3* 4.9  CL 105 104 102 102 100 100 99  CO2 '28 30 30 31 '$ 32 29 33*  GLUCOSE 90 101* 98 107* 126* 143* 108*  BUN 42* 42* 37* 37* 37* 34* 39*  CREATININE 1.24 1.47* 1.17 1.26* 1.29* 1.17 1.20  CALCIUM 10.9* 10.9* 11.5* 12.0* 11.3* 11.3* 11.8*  MG 2.0 2.3 2.2  --  2.3 2.0 2.2  PHOS 3.0 3.3 2.7  --  3.5  --  3.3   GFR: Estimated Creatinine Clearance: 48.3 mL/min (by C-G  formula based on SCr of 1.2 mg/dL). Liver Function Tests: Recent Labs  Lab 04/13/22 0413 04/14/22 0445 04/15/22 1708 04/15/22 1856 04/16/22 0239  AST '17 17 19 18 15  '$ ALT 20 21  $'23 23 22  'f$ ALKPHOS 66 68 65 67 68  BILITOT 0.8 0.8 0.8 0.8 0.8  PROT 6.3* 6.4* 6.1* 5.8* 5.9*  ALBUMIN 2.7* 2.6* 2.6* 2.4* 2.3*   No results for input(s): "LIPASE", "AMYLASE" in the last 168 hours. Recent Labs  Lab 04/15/22 1708  AMMONIA 19   Coagulation Profile: Recent Labs  Lab 04/12/22 0504 04/13/22 1116 04/14/22 0445 04/15/22 0351 04/16/22 0239  INR 3.0* 3.1* 2.9* 2.5* 1.5*   Cardiac Enzymes: No results for input(s): "CKTOTAL", "CKMB", "CKMBINDEX", "TROPONINI" in the last 168 hours. BNP (last 3 results) No results for input(s): "PROBNP" in the last 8760 hours. HbA1C: No results for input(s): "HGBA1C" in the last 72 hours. CBG: Recent Labs  Lab 04/15/22 1708 04/16/22 0632  GLUCAP 132* 99   Lipid Profile: No results for input(s): "CHOL", "HDL", "LDLCALC", "TRIG", "CHOLHDL", "LDLDIRECT" in the last 72 hours. Thyroid Function Tests: Recent Labs    04/15/22 1708  TSH 1.072   Anemia Panel: Recent Labs    04/15/22 1708  VITAMINB12 424   Sepsis Labs: Recent Labs  Lab 04/11/22 2022 04/15/22 1856 04/16/22 0239  PROCALCITON  --  0.18 0.27  LATICACIDVEN 1.4  --   --     Recent Results (from the past 240 hour(s))  Urine Culture     Status: None   Collection Time: 04/11/22  2:24 PM   Specimen: Urine, Clean Catch  Result Value Ref Range Status   Specimen Description   Final    URINE, CLEAN CATCH Performed at [facility], [place] [address] [place], [place] [address]    Special Requests   Final    NONE Performed at [facility], [place] [address], [place], [place] [address]    Culture   Final    NO GROWTH Performed at [facility], [place] [address], [place], [place] [address]    Report Status 04/12/2022 FINAL  Final  MRSA Next Gen by  PCR, Nasal     Status: None   Collection Time: 04/15/22  6:32 PM   Specimen: Nasal Mucosa; Nasal Swab  Result Value Ref Range Status   MRSA by PCR Next Gen NOT DETECTED NOT DETECTED Final    Comment: (NOTE) The GeneXpert MRSA Assay (FDA approved for NASAL specimens only), is one component of a comprehensive MRSA colonization surveillance program. It is not intended to diagnose MRSA infection nor to guide or monitor treatment for MRSA infections. Test performance is not FDA approved in patients less than 40 years old. Performed at [facility], [place] [address] [place],  [address]   Respiratory (~20 pathogens) panel by PCR     Status: None   Collection Time: 04/16/22 12:13 PM   Specimen: Nasopharyngeal Swab; Respiratory  Result Value Ref Range Status   Adenovirus NOT DETECTED NOT DETECTED Final   Coronavirus 229E NOT DETECTED NOT DETECTED Final    Comment: (NOTE) The Coronavirus on the Respiratory Panel, DOES NOT test for the novel  Coronavirus (2019 nCoV)    Coronavirus HKU1 NOT DETECTED NOT DETECTED Final   Coronavirus NL63 NOT DETECTED NOT DETECTED Final   Coronavirus OC43 NOT DETECTED NOT DETECTED Final   Metapneumovirus NOT DETECTED NOT DETECTED Final   Rhinovirus / Enterovirus NOT DETECTED NOT DETECTED Final   Influenza A NOT DETECTED NOT DETECTED Final   Influenza B NOT DETECTED NOT DETECTED Final   Parainfluenza Virus 1 NOT DETECTED NOT DETECTED Final   Parainfluenza Virus 2 NOT DETECTED  NOT DETECTED Final   Parainfluenza Virus 3 NOT DETECTED NOT DETECTED Final   Parainfluenza Virus 4 NOT DETECTED NOT DETECTED Final   Respiratory Syncytial Virus NOT DETECTED NOT DETECTED Final   Bordetella pertussis NOT DETECTED NOT DETECTED Final   Bordetella Parapertussis NOT DETECTED NOT DETECTED Final   Chlamydophila pneumoniae NOT DETECTED NOT DETECTED Final   Mycoplasma pneumoniae NOT DETECTED NOT DETECTED Final    Comment: Performed at [place], [place] [address] [place], [place] [address]     Radiology Studies: ECHOCARDIOGRAM COMPLETE  Result Date: 04/16/2022    ECHOCARDIOGRAM REPORT   Patient Name:   RONDARIUS KADRMAS Date of Exam: 04/16/2022 Medical Rec #:  [id number]   Height:       72.5 in Accession #:    [id number]  Weight:       189.4 lb Date of Birth:  1933/04/01   BSA:          2.092 m Patient Age:    66 years    BP:           134/63 mmHg Patient Gender: M           HR:           77 bpm. Exam Location:  Inpatient Procedure: 2D Echo Indications:    Cardiomyopathy  History:        Patient has prior history of Echocardiogram examinations, most                 recent 02/14/2022. Cardiomyopathy, MV repair, Arrythmias:Atrial                 Fibrillation, Signs/Symptoms:Edema; Risk Factors:Hypertension.  Sonographer:    Harvie Junior Referring Phys: [place]  1. Left ventricular ejection fraction, by estimation, is 55%. The left ventricle has low normal function. The left ventricle has no regional wall motion abnormalities. There is severe concentric left ventricular hypertrophy. Left ventricular diastolic parameters are indeterminate.  2. Right ventricular systolic function is normal. The right ventricular size is mildly enlarged. There is normal pulmonary artery systolic pressure.  3. Left atrial size was moderately dilated.  4. Large pleural effusion in the left lateral region.  5. The mitral valve has been repaired/replaced. Mild to moderate mitral valve regurgitation and eccentric, best seen in subcostal views  6. The aortic valve is tricuspid. Aortic valve regurgitation is mild. Comparison(s): Similar MR; has had intermittent atrial fibrillation through this study. FINDINGS  Left Ventricle: Left ventricular ejection fraction, by estimation, is 55%. The left ventricle has low normal function. The left ventricle has no regional wall motion abnormalities. The left ventricular internal cavity size was normal in size. There is severe  concentric left ventricular hypertrophy. Left ventricular diastolic parameters are indeterminate. Right Ventricle: The right ventricular size is mildly enlarged. No increase in right ventricular wall thickness. Right ventricular systolic function is normal. There is normal pulmonary artery systolic pressure. The tricuspid regurgitant velocity is 2.74  m/s, and with an assumed right atrial pressure of 3 mmHg, the estimated right ventricular systolic pressure is 42.6 mmHg. Left Atrium: Left atrial size was moderately dilated. Right Atrium: Right atrial size was normal in size. Pericardium: Trivial pericardial effusion is present. Mitral Valve: The mitral valve has been repaired/replaced. Mild to moderate mitral valve regurgitation. Tricuspid Valve: The tricuspid valve is normal in structure. Tricuspid valve regurgitation is mild . No evidence of tricuspid stenosis. Aortic Valve: The aortic valve is tricuspid. Aortic valve regurgitation is mild.  Aortic regurgitation PHT measures 680 msec. Aortic valve mean gradient measures 4.0 mmHg. Aortic valve peak gradient measures 7.5 mmHg. Aortic valve area, by VTI measures 3.38 cm. Pulmonic Valve: The pulmonic valve was normal in structure. Pulmonic valve regurgitation is not visualized. No evidence of pulmonic stenosis. Aorta: The aortic root and ascending aorta are structurally normal, with no evidence of dilitation. IAS/Shunts: No atrial level shunt detected by color flow Doppler. Additional Comments: There is a large pleural effusion in the left lateral region.  LEFT VENTRICLE PLAX 2D LVIDd:         4.80 cm      Diastology LVIDs:         3.50 cm      LV e' medial:    5.98 cm/s LV PW:         1.40 cm      LV E/e' medial:  21.1 LV IVS:        1.60 cm      LV e' lateral:   11.50 cm/s LVOT diam:     2.30 cm      LV E/e' lateral: 11.0 LV SV:         61 LV SV Index:   29 LVOT Area:     4.15 cm  LV Volumes (MOD) LV vol d, MOD A2C: 146.0 ml LV vol d, MOD A4C: 115.0 ml LV vol s,  MOD A2C: 53.5 ml LV vol s, MOD A4C: 47.2 ml LV SV MOD A2C:     92.5 ml LV SV MOD A4C:     115.0 ml LV SV MOD BP:      79.5 ml RIGHT VENTRICLE RV Basal diam:  4.20 cm RV Mid diam:    3.70 cm RV S prime:     23.20 cm/s TAPSE (M-mode): 1.4 cm LEFT ATRIUM              Index        RIGHT ATRIUM           Index LA diam:        4.20 cm  2.01 cm/m   RA Area:     20.60 cm LA Vol (A2C):   111.0 ml 53.05 ml/m  RA Volume:   63.00 ml  30.11 ml/m LA Vol (A4C):   78.3 ml  37.42 ml/m LA Biplane Vol: 95.7 ml  45.74 ml/m  AORTIC VALVE                    PULMONIC VALVE AV Area (Vmax):    3.43 cm     PV Vmax:       1.36 m/s AV Area (Vmean):   3.14 cm     PV Peak grad:  7.4 mmHg AV Area (VTI):     3.38 cm AV Vmax:           137.00 cm/s AV Vmean:          98.600 cm/s AV VTI:            0.182 m AV Peak Grad:      7.5 mmHg AV Mean Grad:      4.0 mmHg LVOT Vmax:         113.00 cm/s LVOT Vmean:        74.600 cm/s LVOT VTI:          0.148 m LVOT/AV VTI ratio: 0.81 AI PHT:            680 msec  AORTA  Ao Root diam: 3.70 cm Ao Asc diam:  3.50 cm MITRAL VALVE                TRICUSPID VALVE MV Area (PHT): 4.06 cm     TR Peak grad:   30.0 mmHg MV Decel Time: 187 msec     TR Vmax:        274.00 cm/s MR Peak grad: 99.9 mmHg MR Vmax:      499.67 cm/s   SHUNTS MV E velocity: 126.00 cm/s  Systemic VTI:  0.15 m MV A velocity: 94.00 cm/s   Systemic Diam: 2.30 cm MV E/A ratio:  1.34 Rudean Haskell MD Electronically signed by Rudean Haskell MD Signature Date/Time: 04/16/2022/3:37:21 PM    Final    CT HEAD WO CONTRAST (5MM)  Result Date: 04/16/2022 CLINICAL DATA:  Provided history: Subarachnoid hemorrhage. EXAM: CT HEAD WITHOUT CONTRAST TECHNIQUE: Contiguous axial images were obtained from the base of the skull through the vertex without intravenous contrast. RADIATION DOSE REDUCTION: This exam was performed according to the departmental dose-optimization program which includes automated exposure control, adjustment of the mA and/or  kV according to patient size and/or use of iterative reconstruction technique. COMPARISON:  Brain MRI 04/16/2022. Non-contrast head CT 04/15/2022. CT angiogram head/neck 04/15/2022. FINDINGS: Brain: Mild-to-moderate generalized cerebral atrophy. Mild cerebellar atrophy. Trace acute subarachnoid hemorrhage overlying the posterior right frontal lobe, unchanged (series 2, image 27) (series 5, image 41) (series 6, image 22). 13 x 4 mm dural-based mass overlying the mid right frontal lobe, likely reflecting a meningioma. No significant mass effect upon the underlying brain parenchyma. Chronic lacunar infarcts in bilateral cerebral hemispheric white matter, better appreciated on the same-day brain MRI. Background mild chronic small ischemic changes within the cerebral white matter and pons. No demarcated cortical infarct No midline shift or hydrocephalus. Vascular: No hyperdense vessel. Atherosclerotic calcifications. Skull: No fracture or aggressive osseous lesion. Sinuses/Orbits: No mass or acute finding within the imaged orbits. No significant paranasal sinus disease at the imaged levels. IMPRESSION: Trace acute subarachnoid hemorrhage overlying the posterior right frontal lobe, unchanged from the brain MRI performed earlier today. Redemonstrated 13 x 4 mm dural-based mass overlying the mid right frontal lobe, likely reflecting a meningioma. No significant mass effect upon the underlying brain parenchyma. Background cerebral atrophy and chronic small vessel disease, stable. Electronically Signed   By: Kellie Simmering D.O.   On: 04/16/2022 12:16   CT CHEST WO CONTRAST  Result Date: 04/16/2022 CLINICAL DATA:  Sepsis EXAM: CT CHEST WITHOUT CONTRAST TECHNIQUE: Multidetector CT imaging of the chest was performed following the standard protocol without IV contrast. RADIATION DOSE REDUCTION: This exam was performed according to the departmental dose-optimization program which includes automated exposure control, adjustment  of the mA and/or kV according to patient size and/or use of iterative reconstruction technique. COMPARISON:  CT abdomen pelvis, 04/11/2022 FINDINGS: Cardiovascular: Aortic atherosclerosis. Cardiomegaly. Three-vessel coronary artery calcifications. Enlargement of the main pulmonary artery measuring up to 3.8 cm. No pericardial effusion. Mediastinum/Nodes: Prominent mediastinal lymph nodes. Thyroid gland, trachea, and esophagus demonstrate no significant findings. Lungs/Pleura: Moderate bilateral pleural effusions and associated atelectasis or consolidation increased when compared to the lung bases on examination dated 04/11/2022. Diffuse bilateral bronchial wall thickening. Ground-glass airspace opacity and interseptal lobular thickening. Upper Abdomen: No acute abnormality. Musculoskeletal: No chest wall abnormality. No acute osseous findings. IMPRESSION: 1. Moderate bilateral pleural effusions and associated atelectasis or consolidation, increased when compared to the lung bases on examination dated 04/11/2022. 2. Diffuse bilateral bronchial wall thickening,  ground-glass airspace opacity and interseptal lobular thickening, most consistent with pulmonary edema. 3. Cardiomegaly and coronary artery disease. 4. Enlargement of the main pulmonary artery, as can be seen in pulmonary hypertension. Aortic Atherosclerosis (ICD10-I70.0). Electronically Signed   By: Delanna Ahmadi M.D.   On: 04/16/2022 12:09   MR BRAIN WO CONTRAST  Result Date: 04/16/2022 CLINICAL DATA:  Provided history: Subarachnoid hemorrhage. EXAM: MRI HEAD WITHOUT CONTRAST TECHNIQUE: Multiplanar, multiecho pulse sequences of the brain and surrounding structures were obtained without intravenous contrast. COMPARISON:  CT angiogram head/neck 04/15/2022. Head CT 04/15/2022. FINDINGS: Mild intermittent motion degradation. Brain: Mild-to-moderate generalized cerebral atrophy. Mild cerebellar atrophy. Small curvilinear focus of T2 FLAIR hyperintense signal  abnormality and susceptibility-weighted signal loss along the posterior right frontal lobe, compatible with small-volume acute subarachnoid hemorrhage (for instance as seen on series 9, image 48) (series 10, image 57). 13 x 4 mm dural-based mass overlying the mid right frontal lobe, likely reflecting a meningioma (series 9, image 40). No significant mass effect upon the underlying brain parenchyma. Chronic lacunar infarcts within bilateral cerebral hemispheric white matter. Background mild for age multifocal T2 FLAIR hyperintense signal abnormality within the cerebral white matter and pons, nonspecific but compatible with chronic small vessel ischemic disease. 3-4 mm T1 and T2 FLAIR hyperintense lesion within the anterior right frontal lobe white matter with associated susceptibility-weighted signal loss (series 9, image 27) (series 10, image 33). This may reflect a cavernoma or chronic microhemorrhage. Several chronic microhemorrhages scattered elsewhere within the supratentorial and infratentorial brain. There is no acute infarct. No evidence of an intracranial mass. No midline shift or evidence of hydrocephalus. Vascular: Maintained flow voids within the proximal large arterial vessels. Skull and upper cervical spine: No focal suspicious marrow lesion. Incompletely assessed cervical spondylosis. Sinuses/Orbits: No mass or acute finding within the imaged orbits. Prior bilateral ocular lens replacement. Small mucous retention cyst within the left maxillary sinus. IMPRESSION: 1. Small-volume acute subarachnoid hemorrhage overlying the posterior right frontal lobe, unchanged from the prior head CT of 04/15/2022. 2. 13 x 4 mm dural-based mass overlying the right frontal lobe, likely reflecting a meningioma. No significant mass effect upon the underlying brain parenchyma. 3. Chronic lacunar infarcts in the bilateral cerebral hemispheric white matter. 4. Background mild cerebral white matter and pontine chronic small  vessel ischemic disease. 5. 3-4 mm lesion within the anterior right frontal lobe, which may reflect a cavernoma or chronic microhemorrhage. 6. Several chronic microhemorrhages scattered elsewhere within the supratentorial and infratentorial brain. 7. Mild-to-moderate generalized cerebral atrophy. 8. Mild cerebellar atrophy. Electronically Signed   By: Kellie Simmering D.O.   On: 04/16/2022 11:45   DG CHEST PORT 1 VIEW  Result Date: 04/16/2022 CLINICAL DATA:  Shortness of breath. EXAM: PORTABLE CHEST 1 VIEW COMPARISON:  Chest x-ray April 15, 2022. FINDINGS: Slightly more dense left basilar consolidation with similar additional patchy airspace opacities throughout the lungs. Possible trace bilateral pleural effusions. No visible pneumothorax. Similar cardiomediastinal silhouette. Median sternotomy. Mitral valve replacement. IMPRESSION: Slightly more dense left basilar consolidation with similar additional patchy airspace opacities throughout the lungs. Electronically Signed   By: Margaretha Sheffield M.D.   On: 04/16/2022 08:15   CT ANGIO HEAD NECK W WO CM (CODE STROKE)  Result Date: 04/15/2022 CLINICAL DATA:  Acute neurologic deficit EXAM: CT ANGIOGRAPHY HEAD AND NECK TECHNIQUE: Multidetector CT imaging of the head and neck was performed using the standard protocol during bolus administration of intravenous contrast. Multiplanar CT image reconstructions and MIPs were obtained to evaluate the vascular  anatomy. Carotid stenosis measurements (when applicable) are obtained utilizing NASCET criteria, using the distal internal carotid diameter as the denominator. RADIATION DOSE REDUCTION: This exam was performed according to the departmental dose-optimization program which includes automated exposure control, adjustment of the mA and/or kV according to patient size and/or use of iterative reconstruction technique. CONTRAST:  170m OMNIPAQUE IOHEXOL 350 MG/ML SOLN COMPARISON:  None Available. FINDINGS: CTA NECK FINDINGS  SKELETON: There is no bony spinal canal stenosis. No lytic or blastic lesion. OTHER NECK: Normal pharynx, larynx and major salivary glands. No cervical lymphadenopathy. Unremarkable thyroid gland. UPPER CHEST: Bilateral medium-sized pleural effusions and multiple subcentimeter mediastinal lymph nodes. Moderate pulmonary edema. AORTIC ARCH: There is calcific atherosclerosis of the aortic arch. There is no aneurysm, dissection or hemodynamically significant stenosis of the visualized portion of the aorta. Conventional 3 vessel aortic branching pattern. The visualized proximal subclavian arteries are widely patent. RIGHT CAROTID SYSTEM: Normal without aneurysm, dissection or stenosis. LEFT CAROTID SYSTEM: Normal without aneurysm, dissection or stenosis. VERTEBRAL ARTERIES: Left dominant configuration. Both origins are clearly patent. There is no dissection, occlusion or flow-limiting stenosis to the skull base (V1-V3 segments). CTA HEAD FINDINGS POSTERIOR CIRCULATION: --Vertebral arteries: Left vertebral artery terminates in PICA. Otherwise normal. --Inferior cerebellar arteries: Normal. --Basilar artery: Normal. --Superior cerebellar arteries: Normal. --Posterior cerebral arteries (PCA): Normal. ANTERIOR CIRCULATION: --Intracranial internal carotid arteries: Normal. --Anterior cerebral arteries (ACA): Normal. Both A1 segments are present. Patent anterior communicating artery (a-comm). --Middle cerebral arteries (MCA): Normal. VENOUS SINUSES: As permitted by contrast timing, patent. ANATOMIC VARIANTS: None Review of the MIP images confirms the above findings. IMPRESSION: 1. No emergent large vessel occlusion or high-grade stenosis of the intracranial arteries. 2. Bilateral medium-sized pleural effusions and moderate pulmonary edema. 3. Upper mediastinal lymphadenopathy. Electronically Signed   By: KUlyses JarredM.D.   On: 04/15/2022 20:38   DG CHEST PORT 1 VIEW  Result Date: 04/15/2022 CLINICAL DATA:  [id number] SOB  (shortness of breath) [id number] EXAM: PORTABLE CHEST 1 VIEW COMPARISON:  Chest x-ray 02/13/2022 FINDINGS: The heart and mediastinal contours are within normal limits. Mitral valve replacement. Aortic calcification. Interval development of patchy airspace opacities most prominent within the right lower lung zone and retrocardiac airspace opacity. No pulmonary edema. Bilateral trace pleural effusions. No pneumothorax. No acute osseous abnormality.  Sternotomy wires are intact. IMPRESSION: 1. Multifocal pneumonia. Followup PA and lateral chest X-ray is recommended in 3-4 weeks following therapy to ensure resolution and exclude underlying malignancy. 2. Bilateral trace pleural effusions. 3.  Aortic Atherosclerosis (ICD10-I70.0). Electronically Signed   By: MIven FinnM.D.   On: 04/15/2022 19:20   [place]KG SITE RITE  Result Date: 04/15/2022 If Site Rite image not attached, placement could not be confirmed due to current cardiac rhythm.  CT HEAD WO CONTRAST (5MM)  Result Date: 04/15/2022 CLINICAL DATA:  Mental status change, unknown cause EXAM: CT HEAD WITHOUT CONTRAST TECHNIQUE: Contiguous axial images were obtained from the base of the skull through the vertex without intravenous contrast. RADIATION DOSE REDUCTION: This exam was performed according to the departmental dose-optimization program which includes automated exposure control, adjustment of the mA and/or kV according to patient size and/or use of iterative reconstruction technique. COMPARISON:  CT head 01/27/2022 BRAIN: BRAIN Cerebral ventricle sizes are concordant with the degree of cerebral volume loss. Patchy and confluent areas of decreased attenuation are noted throughout the deep and periventricular white matter of the cerebral hemispheres bilaterally, compatible with chronic microvascular ischemic disease. No evidence of large-territorial acute infarction. No parenchymal  hemorrhage. No mass lesion. Vague right frontal sulcal hyperdensity may  represent a trace developing subarachnoid hemorrhage (4:40, 2:28). No mass effect or midline shift. No hydrocephalus. Basilar cisterns are patent. Vascular: No hyperdense vessel. Skull: No acute fracture or focal lesion. Sinuses/Orbits: Paranasal sinuses and mastoid air cells are clear. Bilateral lens replacement. Otherwise the orbits are unremarkable. Other: None. IMPRESSION: Vague right frontal sulcal hyperdensity may represent a trace developing subarachnoid hemorrhage. These results will be called to the ordering clinician or representative by the Radiologist Assistant, and communication documented in the PACS or [employer]. Electronically Signed   By: Iven Finn M.D.   On: 04/15/2022 17:25    Scheduled Meds:  Chlorhexidine Gluconate Cloth  6 each Topical Daily   finasteride  5 mg Oral Daily   furosemide  40 mg Intravenous Q8H   mouth rinse  15 mL Mouth Rinse 4 times per day   pantoprazole (PROTONIX) IV  40 mg Intravenous Q12H   predniSONE  10 mg Oral Daily   pyridostigmine  2 mg Intravenous QID   Or   pyridostigmine  60 mg Oral QID   tamsulosin  0.4 mg Oral Daily   Continuous Infusions:  ceFEPime (MAXIPIME) IV Stopped (04/16/22 1237)   dextrose 5% lactated ringers 10 mL/hr at 04/16/22 1500    LOS: 4 days   Raiford Noble, DO [facility] Available via Epic secure chat 7am-7pm After these hours, please refer to coverage provider listed on [email or web address] 04/16/2022, 7:14 PM

## 2022-04-16 NOTE — Progress Notes (Signed)
PT sleeping on BiPAP, therefore, NIF and FVC not performed for 0800 visit.

## 2022-04-16 NOTE — Progress Notes (Signed)
Pharmacy Antibiotic Note  Daniel Sharp is a 88 y.o. male admitted on 04/11/2022 hematuria/melena and anemia.  His PMH is significant for warfarin anticoagulation for afib, myasthenia gravis, CKD s/p nephrectomy, .  He was initially started on Ceftriaxone on 10/6 for suspected UTI (hematuria), but on 10/9 he became more weak and unresponsive, hypoxic with significant respiratory compromise.  CXR shows multifocal pneumonia.  Pharmacy has been consulted for Cefepime dosing for HCAP.    Note:  MG exacerbations have not been associated with the use of cephalosporins.  Plan: Cefepime 2g IV q12h Follow up renal function, culture results, and clinical course.   Height: 6' 0.5" (184.2 cm) Weight: 85.9 kg (189 lb 6 oz) IBW/kg (Calculated) : 78.75  Temp (24hrs), Avg:97.8 F (36.6 C), Min:97.4 F (36.3 C), Max:98.2 F (36.8 C)  Recent Labs  Lab 04/11/22 2022 04/12/22 0504 04/14/22 0445 04/15/22 0351 04/15/22 1708 04/15/22 1856 04/16/22 0239  WBC  --    < > 11.1* 10.0 12.1* 10.9* 9.3  CREATININE  --    < > 1.17 1.26* 1.29* 1.17 1.20  LATICACIDVEN 1.4  --   --   --   --   --   --    < > = values in this interval not displayed.    Estimated Creatinine Clearance: 48.3 mL/min (by C-G formula based on SCr of 1.2 mg/dL).    Allergies  Allergen Reactions   Aminoglycosides     Hx of MG    Botox [Onabotulinumtoxina]     Hx of MG    Corticosteroids     Caution with high doses as Hx of MG    Hydroxychloroquine     Hx of MG    Levaquin [Levofloxacin]     Hx of MG  - avoid all Fluoroquinolones   Macrolides And Ketolides     Hx of MG    Magnesium-Containing Compounds     Use with caution as pt with Hx of MG    Neuromuscular Blocking Agents     Hx of MG    Penicillamine     Hx of MG    Procainamide     Hx of MG    Quinidine     Hx of MG     Antimicrobials this admission: 10/6 Ceftriaxone >> 10/10 10/10 Cefepime >>   Microbiology results: 10/5 UCx: NGF 10/9 MRSA PCR: not  detected 10/10 Resp panel: 10/10 RPR: non reactive  Thank you for allowing pharmacy to be a part of this patient's care.  Gretta Arab PharmD, BCPS Clinical Pharmacist [facility] main pharmacy [phone] 04/16/2022 10:17 AM

## 2022-04-16 NOTE — Progress Notes (Signed)
   CT and echo more cw Acute on chronic d-CHF NIF OK  Plan  -0 increase diuresis - bipap qhs    SIGNATURE    Dr. Brand Males, M.D., F.C.C.P,  Pulmonary and Critical Care Medicine Staff Physician, [place] Director - Interstitial Lung Disease  Program  Medical Director - [facility] [place] [facility] [place], [place], [address]   Pager: [phone], If no answer  -[place] or [place] [phone] Telephone (clinical office): [phone] Telephone (research): [phone]  4:34 PM 04/16/2022

## 2022-04-16 NOTE — Consult Note (Signed)
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  Patient was admitted on 10/5 for severe gross hematuria and rectal bleeding.  Warfarin has been held.  He had had a supratherapeutic INR.  He is currently on BiPAP.  Therefore, unable to provide history.  I was called earlier today for consultation of gross hematuria.  Upon examination, his output is clear yellow with no gross hematuria.  He had a CT of the abdomen and pelvis without contrast on 10/5 that revealed a markedly enlarged prostate.  Residual right kidney was normal in size and position with no hydronephrosis.  He also had polyploid soft tissue within the rectum, not well assessed on the noncontrast examination, suspicious for underlying rectal mass.  He had diverticulosis without diverticulitis.  Overall, hemoglobin has been stable.  Most recently 7.6 from 8.0 yesterday.  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 30 tablet 3 04/11/2022   carboxymethylcellulose (REFRESH PLUS) 0.5 % SOLN 1 drop daily as needed (dry  eyes).   Past Week   Cholecalciferol (VITAMIN D3 PO) Take 1 tablet by mouth daily.   04/11/2022   docusate sodium (COLACE) 100 MG capsule Take 1 capsule (100 mg total) by mouth 2 (two) times daily. (Patient taking differently: Take 100 mg by mouth daily.)   04/11/2022   ferrous sulfate 325 (65 FE) MG tablet Take 325 mg by mouth daily with breakfast.   04/11/2022   furosemide (LASIX) 20 MG tablet Take 20 mg by mouth daily.   04/11/2022   glucosamine-chondroitin 500-400 MG tablet Take 1 tablet by mouth daily.   04/11/2022   HYDROcodone-acetaminophen (NORCO/VICODIN) 5-325 MG tablet Take 1-2 tablets by mouth every 6 (six) hours as needed for moderate pain or severe pain. 15 tablet 0 unk   pantoprazole (PROTONIX) 40 MG tablet Take 1 tablet (40 mg total) by mouth daily. 30 tablet 0 04/11/2022   predniSONE (DELTASONE) 5 MG tablet Take 2 tablets (10 mg total) by mouth daily. (Patient taking differently: Take 10 mg by mouth daily. Taking 2 tabs by mouth daily) 180 tablet 1 04/11/2022   pyridostigmine (MESTINON) 60 MG tablet Take 1 tablet (60 mg total) by mouth every 8 (eight) hours.   04/11/2022   tamsulosin (FLOMAX) 0.4 MG CAPS capsule Take 0.4 mg by mouth daily.   04/11/2022   warfarin (COUMADIN) 5 MG tablet Take 2.5-5 mg by mouth See admin instructions.  Take 1 tablet (5 mg)on  Mon. Weds. Thursday, Friday and Sunday. Taking 1/2 tab( 2.5 mg) on Tues & Saturday   04/10/2022 at 0800   Allergies:  Allergies  Allergen Reactions   Aminoglycosides     Hx of MG    Botox [Onabotulinumtoxina]     Hx of MG    Corticosteroids     Caution with high doses as Hx of MG    Hydroxychloroquine     Hx of MG    Levaquin [Levofloxacin]     Hx of MG  - avoid all Fluoroquinolones   Macrolides And Ketolides     Hx of MG    Magnesium-Containing Compounds     Use with caution as pt with Hx of MG    Neuromuscular Blocking Agents     Hx of MG    Penicillamine     Hx of MG    Procainamide     Hx of MG    Quinidine     Hx of  MG     Family History  Problem Relation Age of Onset   Other Mother        Gallbladder removed   Stroke Father        intracranial stroke   Stroke Brother    Colon cancer Neg Hx    Rectal cancer Neg Hx    Social History:  reports that he has quit smoking. His smoking use included cigarettes, pipe, and cigars. He has quit using smokeless tobacco.  His smokeless tobacco use included chew. He reports current alcohol use of about 7.0 standard drinks of alcohol per week. He reports that he does not use drugs.  ROS: A complete review of systems was performed.  All systems are negative except for pertinent findings as noted. ROS   Physical Exam:  Vital signs in last 24 hours: Temp:  [97.4 F (36.3 C)-98.3 F (36.8 C)] 97.5 F (36.4 C) (10/10 1623) Pulse Rate:  [63-104] 69 (10/10 1800) Resp:  [15-28] 15 (10/10 1800) BP: (95-146)/(44-91) 113/49 (10/10 1800) SpO2:  [97 %-100 %] 100 % (10/10 1800) FiO2 (%):  [30 %] 30 % (10/10 0600) General:  No acute distress on BiPAP cardiovascular: Regular rate and rhythm Lungs: Regular rate and effort on BiPAP Abdomen: Soft, nontender, nondistended, no abdominal masses Genitourinary: Pure wick in place.  This is draining clear yellow urine  Laboratory Data:  Results for orders placed or performed during the hospital encounter of 04/11/22 (from the past 24 hour(s))  Procalcitonin - Baseline     Status: None   Collection Time: 04/15/22  6:56 PM  Result Value Ref Range   Procalcitonin 0.18 ng/mL  CBC     Status: Abnormal   Collection Time: 04/15/22  6:56 PM  Result Value Ref Range   WBC 10.9 (H) 4.0 - 10.5 K/uL   RBC 2.79 (L) 4.22 - 5.81 MIL/uL   Hemoglobin 8.0 (L) 13.0 - 17.0 g/dL   HCT 27.3 (L) 39.0 - 52.0 %   MCV 97.8 80.0 - 100.0 fL   MCH 28.7 26.0 - 34.0 pg   MCHC 29.3 (L) 30.0 - 36.0 g/dL   RDW 14.6 11.5 - 15.5 %   Platelets 186 150 - 400 K/uL   nRBC 0.0 0.0 - 0.2 %  Comprehensive metabolic panel     Status: Abnormal   Collection  Time: 04/15/22  6:56 PM  Result Value Ref Range   Sodium 132 (L) 135 - 145 mmol/L   Potassium  5.3 (H) 3.5 - 5.1 mmol/L   Chloride 100 98 - 111 mmol/L   CO2 29 22 - 32 mmol/L   Glucose, Bld 143 (H) 70 - 99 mg/dL   BUN 34 (H) 8 - 23 mg/dL   Creatinine, Ser 1.17 0.61 - 1.24 mg/dL   Calcium 11.3 (H) 8.9 - 10.3 mg/dL   Total Protein 5.8 (L) 6.5 - 8.1 g/dL   Albumin 2.4 (L) 3.5 - 5.0 g/dL   AST 18 15 - 41 U/L   ALT 23 0 - 44 U/L   Alkaline Phosphatase 67 38 - 126 U/L   Total Bilirubin 0.8 0.3 - 1.2 mg/dL   GFR, Estimated >60 >60 mL/min   Anion gap 3 (L) 5 - 15  Magnesium     Status: None   Collection Time: 04/15/22  6:56 PM  Result Value Ref Range   Magnesium 2.0 1.7 - 2.4 mg/dL  Calcium, ionized     Status: Abnormal   Collection Time: 04/15/22  6:56 PM  Result Value Ref Range   Calcium, Ionized, Serum 7.1 (H) 4.5 - 5.6 mg/dL  Troponin I (High Sensitivity)     Status: Abnormal   Collection Time: 04/15/22  6:56 PM  Result Value Ref Range   Troponin I (High Sensitivity) 54 (H) <18 ng/L  Blood gas, arterial     Status: Abnormal   Collection Time: 04/15/22  9:28 PM  Result Value Ref Range   FIO2 30.00 %   Delivery systems BILEVEL POSITIVE AIRWAY PRESSURE    Inspiratory PAP 18 cmH2O   Expiratory PAP 8 cmH2O   pH, Arterial 7.34 (L) 7.35 - 7.45   pCO2 arterial 63 (H) 32 - 48 mmHg   pO2, Arterial 108 83 - 108 mmHg   Bicarbonate 34.3 (H) 20.0 - 28.0 mmol/L   Acid-Base Excess 6.2 (H) 0.0 - 2.0 mmol/L   O2 Saturation 99.2 %   Patient temperature 36.4    Collection site RIGHT RADIAL    Allens test (pass/fail) PASS PASS  Protime-INR     Status: Abnormal   Collection Time: 04/16/22  2:39 AM  Result Value Ref Range   Prothrombin Time 17.7 (H) 11.4 - 15.2 seconds   INR 1.5 (H) 0.8 - 1.2  Comprehensive metabolic panel     Status: Abnormal   Collection Time: 04/16/22  2:39 AM  Result Value Ref Range   Sodium 136 135 - 145 mmol/L   Potassium 4.9 3.5 - 5.1 mmol/L   Chloride 99 98 - 111  mmol/L   CO2 33 (H) 22 - 32 mmol/L   Glucose, Bld 108 (H) 70 - 99 mg/dL   BUN 39 (H) 8 - 23 mg/dL   Creatinine, Ser 1.20 0.61 - 1.24 mg/dL   Calcium 11.8 (H) 8.9 - 10.3 mg/dL   Total Protein 5.9 (L) 6.5 - 8.1 g/dL   Albumin 2.3 (L) 3.5 - 5.0 g/dL   AST 15 15 - 41 U/L   ALT 22 0 - 44 U/L   Alkaline Phosphatase 68 38 - 126 U/L   Total Bilirubin 0.8 0.3 - 1.2 mg/dL   GFR, Estimated 59 (L) >60 mL/min   Anion gap 4 (L) 5 - 15  Magnesium     Status: None   Collection Time: 04/16/22  2:39 AM  Result Value Ref Range   Magnesium 2.2 1.7 - 2.4 mg/dL  Phosphorus     Status: None   Collection Time: 04/16/22  2:39 AM  Result Value Ref Range  Phosphorus 3.3 2.5 - 4.6 mg/dL  Procalcitonin     Status: None   Collection Time: 04/16/22  2:39 AM  Result Value Ref Range   Procalcitonin 0.27 ng/mL  CBC with Differential/Platelet     Status: Abnormal   Collection Time: 04/16/22  2:39 AM  Result Value Ref Range   WBC 9.3 4.0 - 10.5 K/uL   RBC 2.63 (L) 4.22 - 5.81 MIL/uL   Hemoglobin 7.6 (L) 13.0 - 17.0 g/dL   HCT 25.6 (L) 39.0 - 52.0 %   MCV 97.3 80.0 - 100.0 fL   MCH 28.9 26.0 - 34.0 pg   MCHC 29.7 (L) 30.0 - 36.0 g/dL   RDW 14.6 11.5 - 15.5 %   Platelets 198 150 - 400 K/uL   nRBC 0.0 0.0 - 0.2 %   Neutrophils Relative % 82 %   Neutro Abs 7.6 1.7 - 7.7 K/uL   Lymphocytes Relative 10 %   Lymphs Abs 0.9 0.7 - 4.0 K/uL   Monocytes Relative 6 %   Monocytes Absolute 0.6 0.1 - 1.0 K/uL   Eosinophils Relative 1 %   Eosinophils Absolute 0.1 0.0 - 0.5 K/uL   Basophils Relative 0 %   Basophils Absolute 0.0 0.0 - 0.1 K/uL   Immature Granulocytes 1 %   Abs Immature Granulocytes 0.05 0.00 - 0.07 K/uL  Brain natriuretic peptide     Status: Abnormal   Collection Time: 04/16/22  2:47 AM  Result Value Ref Range   B Natriuretic Peptide 313.1 (H) 0.0 - 100.0 pg/mL  Blood gas, arterial     Status: Abnormal   Collection Time: 04/16/22  4:18 AM  Result Value Ref Range   FIO2 30.00 %   Delivery  systems BILEVEL POSITIVE AIRWAY PRESSURE    Inspiratory PAP 18 cmH2O   Expiratory PAP 8 cmH2O   pH, Arterial 7.39 7.35 - 7.45   pCO2 arterial 57 (H) 32 - 48 mmHg   pO2, Arterial 112 (H) 83 - 108 mmHg   Bicarbonate 34.3 (H) 20.0 - 28.0 mmol/L   Acid-Base Excess 8.1 (H) 0.0 - 2.0 mmol/L   O2 Saturation 99.1 %   Patient temperature 36.6    Collection site RIGHT RADIAL    Drawn by [id number]    Allens test (pass/fail) PASS PASS  Troponin I (High Sensitivity)     Status: Abnormal   Collection Time: 04/16/22  5:39 AM  Result Value Ref Range   Troponin I (High Sensitivity) 67 (H) <18 ng/L  Glucose, capillary     Status: None   Collection Time: 04/16/22  6:32 AM  Result Value Ref Range   Glucose-Capillary 99 70 - 99 mg/dL  Respiratory (~20 pathogens) panel by PCR     Status: None   Collection Time: 04/16/22 12:13 PM   Specimen: Nasopharyngeal Swab; Respiratory  Result Value Ref Range   Adenovirus NOT DETECTED NOT DETECTED   Coronavirus 229E NOT DETECTED NOT DETECTED   Coronavirus HKU1 NOT DETECTED NOT DETECTED   Coronavirus NL63 NOT DETECTED NOT DETECTED   Coronavirus OC43 NOT DETECTED NOT DETECTED   Metapneumovirus NOT DETECTED NOT DETECTED   Rhinovirus / Enterovirus NOT DETECTED NOT DETECTED   Influenza A NOT DETECTED NOT DETECTED   Influenza B NOT DETECTED NOT DETECTED   Parainfluenza Virus 1 NOT DETECTED NOT DETECTED   Parainfluenza Virus 2 NOT DETECTED NOT DETECTED   Parainfluenza Virus 3 NOT DETECTED NOT DETECTED   Parainfluenza Virus 4 NOT DETECTED NOT DETECTED   Respiratory Syncytial  Virus NOT DETECTED NOT DETECTED   Bordetella pertussis NOT DETECTED NOT DETECTED   Bordetella Parapertussis NOT DETECTED NOT DETECTED   Chlamydophila pneumoniae NOT DETECTED NOT DETECTED   Mycoplasma pneumoniae NOT DETECTED NOT DETECTED  Rapid urine drug screen (hospital performed)     Status: None   Collection Time: 04/16/22 12:20 PM  Result Value Ref Range   Opiates NONE DETECTED NONE DETECTED    Cocaine NONE DETECTED NONE DETECTED   Benzodiazepines NONE DETECTED NONE DETECTED   Amphetamines NONE DETECTED NONE DETECTED   Tetrahydrocannabinol NONE DETECTED NONE DETECTED   Barbiturates NONE DETECTED NONE DETECTED  Strep pneumoniae urinary antigen     Status: None   Collection Time: 04/16/22 12:20 PM  Result Value Ref Range   Strep Pneumo Urinary Antigen NEGATIVE NEGATIVE   Recent Results (from the past 240 hour(s))  Urine Culture     Status: None   Collection Time: 04/11/22  2:24 PM   Specimen: Urine, Clean Catch  Result Value Ref Range Status   Specimen Description   Final    URINE, CLEAN CATCH Performed at [facility], [place] [address] [place], [place] [address]    Special Requests   Final    NONE Performed at [facility], [place] [address], [place], [place] [address]    Culture   Final    NO GROWTH Performed at [facility], [place] [address] [place], [place] [address]    Report Status 04/12/2022 FINAL  Final  MRSA Next Gen by PCR, Nasal     Status: None   Collection Time: 04/15/22  6:32 PM   Specimen: Nasal Mucosa; Nasal Swab  Result Value Ref Range Status   MRSA by PCR Next Gen NOT DETECTED NOT DETECTED Final    Comment: (NOTE) The GeneXpert MRSA Assay (FDA approved for NASAL specimens only), is one component of a comprehensive MRSA colonization surveillance program. It is not intended to diagnose MRSA infection nor to guide or monitor treatment for MRSA infections. Test performance is not FDA approved in patients less than 66 years old. Performed at [facility], [place] [address] [place], [place] [address]   Respiratory (~20 pathogens) panel by PCR     Status: None   Collection Time: 04/16/22 12:13 PM   Specimen: Nasopharyngeal Swab; Respiratory  Result Value Ref Range Status   Adenovirus NOT DETECTED NOT DETECTED Final   Coronavirus 229E NOT DETECTED NOT DETECTED Final    Comment: (NOTE) The  Coronavirus on the Respiratory Panel, DOES NOT test for the novel  Coronavirus (2019 nCoV)    Coronavirus HKU1 NOT DETECTED NOT DETECTED Final   Coronavirus NL63 NOT DETECTED NOT DETECTED Final   Coronavirus OC43 NOT DETECTED NOT DETECTED Final   Metapneumovirus NOT DETECTED NOT DETECTED Final   Rhinovirus / Enterovirus NOT DETECTED NOT DETECTED Final   Influenza A NOT DETECTED NOT DETECTED Final   Influenza B NOT DETECTED NOT DETECTED Final   Parainfluenza Virus 1 NOT DETECTED NOT DETECTED Final   Parainfluenza Virus 2 NOT DETECTED NOT DETECTED Final   Parainfluenza Virus 3 NOT DETECTED NOT DETECTED Final   Parainfluenza Virus 4 NOT DETECTED NOT DETECTED Final   Respiratory Syncytial Virus NOT DETECTED NOT DETECTED Final   Bordetella pertussis NOT DETECTED NOT DETECTED Final   Bordetella Parapertussis NOT DETECTED NOT DETECTED Final   Chlamydophila pneumoniae NOT DETECTED NOT DETECTED Final   Mycoplasma pneumoniae NOT DETECTED NOT DETECTED Final    Comment: Performed  at [facility], [place] [address], [place], [place] [address]   Creatinine: Recent Labs    04/12/22 6045 04/13/22 0413 04/14/22 0445 04/15/22 0351 04/15/22 1708 04/15/22 1856 04/16/22 0239  CREATININE 1.24 1.47* 1.17 1.26* 1.29* 1.17 1.20    Impression/Assessment:  Gross hematuria, resolving BPH with lower urinary tract symptoms, urinary frequency  Plan:  Continue tamsulosin.  Add finasteride given his gross hematuria, possibly prostatic in origin.  His CT noncontrast study showed a markedly enlarged prostate.  Otherwise no significant abnormality.  Hold off on CT hematuria protocol for now unless he develops gross hematuria again.  We will start finasteride for possible prostatic bleeding.  I will notify Dr. Lovena Neighbours of his admission.  Please call if he develops significant hematuria again.  Marton Redwood, III 04/16/2022, 6:36 PM

## 2022-04-16 NOTE — Consult Note (Signed)
NEUROLOGY CONSULTATION NOTE   Date of service: April 16, 2022 Patient Name: Daniel Sharp MRN:  [id number] DOB:  1932-10-17 Reason for consult: "encephalopathy" Requesting Provider: Kerney Elbe, DO _ _ _   _ __   _ __ _ _  __ __   _ __   __ _  History of Present Illness  Daniel Sharp is a 88 y.o. 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 He is currently admitted to [facility] hospital with gross hematuria/melena and anemia. He has also been reporting lethargy. He was more confused and difficult to arouse over the last day and found to be in hypoxic and hypercarbic respiratory failure.  Workup demonstrates pulmonary congestion on CXR with significant hypercarbia with arterial pCO2 of 70 and acidotic on ABG. CT head demonstrated a small SAH for which he is being evaluated by Neurosurgery.  We were asked to evaluate him for concern for myasthenia exacerbation. He has been on Bipap all night, Hypercarbia improved on serial ABG. Mentation is much improved this AM and he is awake and alert this morning.   ROS   Constitutional Denies weight loss, fever and chills.   HEENT Denies changes in vision and hearing.   Respiratory Denies SOB and cough. However, does become hypoxic to 88 upon removing Bipap  CV Denies palpitations and CP   GI Denies abdominal pain, nausea, vomiting and diarrhea.   GU Denies dysuria and urinary frequency.   MSK Denies myalgia and joint pain.   Skin Denies rash and pruritus.   Neurological Denies headache and syncope.   Psychiatric Denies recent changes in mood. Denies anxiety and depression.    Past History   Past Medical History:  Diagnosis Date   A-fib [facility])    history of   Arthritis    Dyspnea    H/O mitral valve repair 07/2003   Hypertension    Ocular myasthenia gravis ([place])    takes pred   Pneumonia    Past Surgical History:  Procedure Laterality Date    APPENDECTOMY     BACK SURGERY     cyst removed L3 and L4   CHOLECYSTECTOMY     FINGER SURGERY Right    R hand   MITRAL VALVE REPAIR     on coumadin   NOSE SURGERY     ROBOTIC ASSITED PARTIAL NEPHRECTOMY Left 01/22/2022   Procedure: XI ROBOTIC ASSITED NEPHRECTOMY;  Surgeon: Ceasar Mons, MD;  Location: [facility];  Service: Urology;  Laterality: Left;   Family History  Problem Relation Age of Onset   Other Mother        Gallbladder removed   Stroke Father        intracranial stroke   Stroke Brother    Colon cancer Neg Hx    Rectal cancer Neg Hx    Social History   Socioeconomic History   Marital status: Widowed    Spouse name: Not on file   Number of children: 2   Years of education: Not on file   Highest education level: Not on file  Occupational History   Not on file  Tobacco Use   Smoking status: Former    Types: Cigarettes, Pipe, Cigars   Smokeless tobacco: Former    Types: Chew   Tobacco comments:    Uses nicotine gum   Vaping Use   Vaping Use: Never used  Substance and Sexual Activity   Alcohol  use: Yes    Alcohol/week: 7.0 standard drinks of alcohol    Types: 7 Glasses of wine per week    Comment: Drinks wine when sons will let him   Drug use: Never   Sexual activity: Not on file  Other Topics Concern   Not on file  Social History Narrative   Lives in [place], [place].  Son lives in [place]      [place]    Lives in a one story home with a basement.    Lives at [facility] at this time   Social Determinants of Health   Financial Resource Strain: Not on file  Food Insecurity: No Food Insecurity (04/12/2022)   Hunger Vital Sign    Worried About Running Out of Food in the Last Year: Never true    Ran Out of Food in the Last Year: Never true  Transportation Needs: No Transportation Needs (04/12/2022)   PRAPARE - Hydrologist (Medical): No    Lack of Transportation (Non-Medical): No  Physical Activity: Not on  file  Stress: Not on file  Social Connections: Not on file   Allergies  Allergen Reactions   Aminoglycosides     Hx of MG    Botox [Onabotulinumtoxina]     Hx of MG    Corticosteroids     Caution with high doses as Hx of MG    Hydroxychloroquine     Hx of MG    Levaquin [Levofloxacin]     Hx of MG  - avoid all Fluoroquinolones   Macrolides And Ketolides     Hx of MG    Magnesium-Containing Compounds     Use with caution as pt with Hx of MG    Neuromuscular Blocking Agents     Hx of MG    Penicillamine     Hx of MG    Procainamide     Hx of MG    Quinidine     Hx of MG     Medications   Medications Prior to Admission  Medication Sig Dispense Refill Last Dose   amLODipine (NORVASC) 5 MG tablet Take 1 tablet (5 mg total) by mouth daily. 30 tablet 3 04/11/2022   carboxymethylcellulose (REFRESH PLUS) 0.5 % SOLN 1 drop daily as needed (dry eyes).   Past Week   Cholecalciferol (VITAMIN D3 PO) Take 1 tablet by mouth daily.   04/11/2022   docusate sodium (COLACE) 100 MG capsule Take 1 capsule (100 mg total) by mouth 2 (two) times daily. (Patient taking differently: Take 100 mg by mouth daily.)   04/11/2022   ferrous sulfate 325 (65 FE) MG tablet Take 325 mg by mouth daily with breakfast.   04/11/2022   furosemide (LASIX) 20 MG tablet Take 20 mg by mouth daily.   04/11/2022   glucosamine-chondroitin 500-400 MG tablet Take 1 tablet by mouth daily.   04/11/2022   HYDROcodone-acetaminophen (NORCO/VICODIN) 5-325 MG tablet Take 1-2 tablets by mouth every 6 (six) hours as needed for moderate pain or severe pain. 15 tablet 0 unk   pantoprazole (PROTONIX) 40 MG tablet Take 1 tablet (40 mg total) by mouth daily. 30 tablet 0 04/11/2022   predniSONE (DELTASONE) 5 MG tablet Take 2 tablets (10 mg total) by mouth daily. (Patient taking differently: Take 10 mg by mouth daily. Taking 2 tabs by mouth daily) 180 tablet 1 04/11/2022   pyridostigmine (MESTINON) 60 MG tablet Take 1 tablet (60 mg total) by mouth  every  8 (eight) hours.   04/11/2022   tamsulosin (FLOMAX) 0.4 MG CAPS capsule Take 0.4 mg by mouth daily.   04/11/2022   warfarin (COUMADIN) 5 MG tablet Take 2.5-5 mg by mouth See admin instructions. Take 1 tablet (5 mg)on  Mon. Weds. Thursday, Friday and Sunday. Taking 1/2 tab( 2.5 mg) on Tues & Saturday   04/10/2022 at [place]:   04/16/22 0100 04/16/22 0200 04/16/22 0300 04/16/22 0346  BP: (!) 112/91 (!) 107/48 125/63 125/63  Pulse: 67 66 78 69  Resp: 18 20 (!) 22 20  Temp:    97.9 F (36.6 C)  TempSrc:    Axillary  SpO2: 100% 100% 100% 100%  Weight:      Height:         Body mass index is 25.33 kg/m.  Physical Exam   General: Laying comfortably in bed; in no acute distress. Bipap in place. HENT: Normal oropharynx and mucosa. Normal external appearance of ears and nose.  Neck: Supple, no pain or tenderness  CV: No JVD. No peripheral edema.  Pulmonary: Symmetric Chest rise. Normal respiratory effort.  Abdomen: Soft to touch, non-tender.  Ext: No cyanosis, edema, or deformity  Skin: No rash. Normal palpation of skin.   Musculoskeletal: Normal digits and nails by inspection. No clubbing.   Neurologic Examination  Mental status/Cognition: Alert, oriented to self, but not to place, month and year, poor attention.  Speech/language: Fluent, comprehension intact, object naming intact, repetition intact.  Cranial nerves:   CN II Pupils equal and reactive to light, no VF deficits    CN III,IV,VI EOM intact, no gaze preference or deviation, no nystagmus    CN V normal sensation in V1, V2, and V3 segments bilaterally    CN VII no asymmetry, no nasolabial fold flattening    CN VIII normal hearing to speech    CN IX & X normal palatal elevation, no uvular deviation. Weak cough.   CN XI 5/5 head turn and 5/5 shoulder shrug bilaterally    CN XII midline tongue protrusion    Motor:  Muscle bulk: normal, tone normal, pronator drift none tremor none Neck flexion:  4+/5 Neck extension: 4+/5  Mvmt Root Nerve  Muscle Right Left Comments  SA C5/6 Ax Deltoid 5 5   EF C5/6 Mc Biceps 5 5   EE C6/7/8 Rad Triceps 5 5   WF C6/7 Med FCR     WE C7/8 PIN ECU     F Ab C8/T1 U ADM/FDI 5 5   HF L1/2/3 Fem Illopsoas 5 5   KE L2/3/4 Fem Quad 5 5   DF L4/5 D Peron Tib Ant 5 5   PF S1/2 Tibial Grc/Sol 5 5    Reflexes:  Right Left Comments  Pectoralis      Biceps (C5/6) 2 2   Brachioradialis (C5/6) 2 2    Triceps (C6/7) 2 2    Patellar (L3/4) 2 2    Achilles (S1)      Hoffman      Plantar     Jaw jerk    Sensation:  Light touch Intact throughout   Pin prick    Temperature    Vibration   Proprioception    Coordination/Complex Motor:  - Finger to Nose intact BL - Heel to shin unable to do - Rapid alternating movement are normal - Gait: Deferred for patient safety.  Labs   CBC:  Recent Labs  Lab 04/15/22 1708  04/15/22 1856 04/16/22 0239  WBC 12.1* 10.9* 9.3  NEUTROABS 10.5*  --  7.6  HGB 8.1* 8.0* 7.6*  HCT 27.1* 27.3* 25.6*  MCV 97.5 97.8 97.3  PLT 197 186 160    Basic Metabolic Panel:  Lab Results  Component Value Date   NA 136 04/16/2022   K 4.9 04/16/2022   CO2 33 (H) 04/16/2022   GLUCOSE 108 (H) 04/16/2022   BUN 39 (H) 04/16/2022   CREATININE 1.20 04/16/2022   CALCIUM 11.8 (H) 04/16/2022   GFRNONAA 59 (L) 04/16/2022   Lipid Panel: No results found for: "LDLCALC" HgbA1c: No results found for: "HGBA1C" Urine Drug Screen: No results found for: "LABOPIA", "COCAINSCRNUR", "LABBENZ", "AMPHETMU", "THCU", "LABBARB"  Alcohol Level     Component Value Date/Time   ETH <10 01/27/2022 1335    CT Head without contrast(Personally reviewed): Vague right frontal sulcal hyperdensity may represent a trace developing subarachnoid hemorrhage.  Impression   Jakarie Pember is a 88 y.o. 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 He is currently  admitted to [facility] hospital with gross hematuria/melena and anemia. He has also been reporting lethargy. He was more confused and difficult to arouse over the last day and found to be in hypoxic and hypercarbic respiratory failure. His neurologic examination is notable for significantly improved mentation this AM after Bipap overnight.  Some concern for mild MG exacerbation given he does have mild weakness of neck flexion and extension. Would expect lethargy to cause more of a generalized weakness. He is stronger in all extremities. He also has weaker than expected cough. However, does have multifocal pneumonia with consolidations on CXR.  I would recommend monitoring closely with Q4H NIFs and VC.  Overall, given improvement in mentation and strength per discussion with overnight RN, I think we can hold off treatment and follow along his progress. If the NIFs and VC are trending down, consider IVIG/PLEX.  Recommendations  - Every 4 hours NIFs and VC. - Recommend elective intubation for respiratory compromise if VC falls below 15 to 20 mL/kg and or NIFs falls below -20cm/H2O. If poor effort and not sure, can always get ABG to assess for CO2 retention. Elective intubation for airway cmopromise if she has difficulty clearing her secretions. Oxygen saturation should not be used to make decision regarding intubation. - Recommend Mestinon PO '60mg'$  TID. If unable to swallow, can switch from PO to IV.('30mg'$  PO is equivalent to '1mg'$  IV). - Medications that may worsen or trigger MG exacerbation: Class IA antiarrhythmics, magnesium, flouroquinolones, macrolides, aminoglycosides, penicillamine, curare, interferon alpha, botox, quinine. Use with caution: calcium channel blocker, beta blockers and statins.  ______________________________________________________________________   Thank you for the opportunity to take part in the care of this patient. If you have any further questions, please contact the neurology  consultation attending.  Signed,  Arlee Pager Number [phone] _ _ _   _ __   _ __ _ _  __ __   _ __   __ _

## 2022-04-16 NOTE — Consult Note (Signed)
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  Also chronic iron deficiency anemia.  He is on chronic steroids for his ocular myasthenia and?  Mestinon  He has few month history of dark-colored stool for which outpatient EGD and colonoscopy was scheduled for April 18, 2022.  Prior to admission required 3 units of PRBC but he was admitted 04/11/2022 with severe gross hematuria suspected hemorrhagic cystitis /admission creatinine of 1.4 mg percent.  Warfarin was held.  Empiric Rocephin was started.  CT abdomen did suggest a rectal mass.  Coumadin was held.  He also had slightly high for thalassemia at 11.8 treated with hydration.  Albumin admission was low at 2.5  Course in the hospital - 04/13/2022: GI consult.  Plan for endoscopy and colonoscopy once INR normalizes [at admission 3].  Also had nonsustained V. Tach -04/14/2022: Found to be generally weak but otherwise alert.  INR 2.9.  Complains of general fatigue.  Son reported progressive weakness thought to be due to physical deconditioning from poor p.o. intake  - 04/15/2022: Progressive weakness and unresponsiveness.  Son concerned significant negative change.  Minimally responsive.  Blood gas with severe acute on chronic respiratory acidosis.  Son denies that he is a smoker having underlying lung disease.  Patient transferred to the ICU and CCM consulted.  Neuro consultation placed.  Chest x-ray suggestive of pulmonary congestion [significantly worse compared to  August 2023], CT head with trace SAH  Past Medical History:    has a past medical history of A-fib ([place]), Arthritis, Dyspnea, H/O mitral valve repair (07/2003), Hypertension, Ocular myasthenia gravis ([place]), and Pneumonia.   has a past surgical history that includes Appendectomy; Nose surgery; Mitral valve repair; Cholecystectomy; Back surgery; Finger surgery (Right); and Robotic assited partial nephrectomy (Left, 01/22/2022).      Significant Hospital Events:  04/11/2022 - admit  - 04/13/2022: GI consult.  Plan for endoscopy and colonoscopy once INR normalizes [at admission 3].  Also had nonsustained V. Tach  -04/14/2022: Found to be generally weak but otherwise alert.  INR 2.9.  Complains of general fatigue.  Son reported progressive weakness thought to be due to physical deconditioning from poor p.o. intake   - 04/15/2022: Progressive weakness and unresponsiveness.  Son concerned significant negative change.  Minimally responsive.  Blood gas with severe acute on chronic respiratory acidosis.  Son denies that he is a smoker having underlying lung disease.  Patient transferred to the ICU and CCM consulted.  Neuro consultation placed.  Chest x-ray suggestive of pulmonary congestion [significantly worse compared to August 2023], CT head with trace SAH  - Neuro consult - monitor with Nif and FVC and Abg  - BNO 313  - LTD code   Interim History / Subjective:   04/16/2022 - BiPAP overnight and NiF not done. Hypercapnia improved on ABG but still PCO2 > 50. Afebirl ewiht normal WBC. Worsening CXR R > L basal air space disease. Creat better to 1.2. BNP up at 300s.  PCT 0.27. ECHO pending. Got lasix x 1 yearsstraay . -2.5L negative balance. Much more awake today but confused  No hematuria No GI bleed  Objective   Blood pressure (!) 127/55, pulse 71, temperature (!) 97.4 F (36.3 C), temperature source Axillary, resp. rate 20, height 6' 0.5" (1.842 m), weight 85.9 kg, SpO2 100 %.    FiO2 (%):   [30 %] 30 %   Intake/Output Summary (Last 24 hours) at 04/16/2022 0917 Last data filed at 04/16/2022 0630 Gross per 24 hour  Intake 412.45 ml  Output 1500 ml  Net -1087.55 ml   Filed Weights   04/11/22 1319 04/13/22 0500 04/14/22 0526  Weight: 87.1 kg 90.1 kg 85.9 kg    Examination: General Appearance:  Looks better. Deconditioned Head:  Normocephalic, without obvious abnormality, atraumatic Eyes:  PERRL - yes, conjunctiva/corneas - muddy. NO PTOSIS     Ears:  Normal external ear canals, both ears Nose:  G tube - no bu has Yale 2L at 100% Throat:  ETT TUBE - no , OG tube - no Neck:  Supple,  No enlargement/tenderness/nodules Lungs: Clear to auscultation bilaterally Heart:  S1 and S2 normal, no murmur, CVP - no.  Pressors - n Abdomen:  Soft, no masses, no organomegaly Genitalia / Rectal:  Not done Extremities:  Extremities- intact Skin:  ntact in exposed areas . Sacral area - not examined Neurologic:  Sedation - none -> RASS - +1 . Moves all 4s - yes. CAM-ICU - POSITIVe . Orientation - PARTIAL . NO PTOSIS. AWAKE and better     Assessment & Plan:   Acute on chronic respiratory acidosis with hypoxemic respiratory failure with pulmonary congestion/inffltates-04/15/2022  04/16/2022 -> improved hypercarbia but still high CO2. CXR worse - ddx HAP v Acute on chronic diast chf  P:   Get Ct chest BiPAP to continue day prn + NIGHT MANDATORTY Low threshold to intubate given myasthenia gravis  - per neuro: elective intubation if VC falls below 15 to 20 mL/kg and or NIFs falls below -20cm/H2O   ?  History of pain requiring opioids History of ocular myasthenia gravis -on prednisone and Mestinon MIld MG flare 04/15/22 Acute on chronic encephalopathy -04/15/2022 CT head with small trace subarachnoid hemorrhage 04/15/2022 [seen by neurosurgery]  - 04/16/2022 bilateral ptosis resolved and metntal stuats improved but stil plesanatly confused  P:   See neurosurgery consult note 04/15/2022  -monitor for the [facility] - MRI and CT head 04/16/2022   Neuro consult -might need IVIG/PLEX  Q4h Nif monitoring  Avoid the following meds in the setting of myasthenia   - As a rule, the listed drugs should be avoided whenever possible, and patients with MG should be followed closely when any new drug is introduced.  Drugs that may exacerbate MG   Antibiotics   Aminoglycosides: e.g., streptomycin, tobramycin, kanamycin   Quinolones: e.g., ciprofloxacin, levofloxacin, ofloxacin, gatifloxacin   Macrolides: e.g., erythromycin, azithromycin, telithromycin   Nondepolarizing muscle relaxants for surgery   d-Tubocurarine (curare), pancuronium, vecuronium, atracurium   Beta-blocking agents   Propranolol, atenolol, metoprolol   Local anesthetics and related agents   Procaine, Xylocaine in large amounts   Procainamide (for arrhythmias)   Botulinum toxin   Botox exacerbates weakness.   Quinine derivatives   Quinine, quinidine, chloroquine, mefloquine (Lariam)   Magnesium   Decreases ACh release   Penicillamine   May cause MG   Drugs with important interactions in MG   Cyclosporine   Broad range of drug interactions, which may raise or lower cyclosporine levels   Azathioprine   Avoid allopurinol; c    History of hypertension on amlodipine  Plan  - Holding amlodipine at the moment    Chronic cor pulmonale with pulmonary systolic pressure in the 23N with reduction in right ventricular function -baseline August 2023 -on Lasix  10/9 and 10/10 -suspect acute on chronic diastolic heart failure  P: Lasix x1 reopeat again 04/16/22 Await  echo  Known atrial fibrillation on Coumadin -Coumadin on hold since admission given hematuria and GI bleed history   10/10  -  Anticoagulation on hold secondary to anticipated endoscopy and colonoscopy . INR now corrected but resp status could pose endoscopy risk  P: Telemetry Hold anticoagulation    Culture-negative sepsis suspected at  admission secondary to hemorrhagic cystitis - Present on Admit Concern for HAP 04/15/22  04/16/2022 -afebrile  P:   Ceftriaxone empiric 04/11/2022 >>Cefepime 04/16/22>>     BPH on Flomax at home Status post nephrectomy for renal cell cancer July 2023 CKD baseline creatinine 1.2-1.6 Admitted with severe gross hematuria  04/16/2022 -creatinine range bound.  No hematuria  P:  Monitor Might need urology consultation 04/16/22 - will defer to triad MD    Mild hypercalcemia at admission -due to dehydration  P: Monitor closely and correct as needed Mnitor all ltes     History of GI bleed prior to admission -outpatient endoscopy and colonoscopy scheduled for 04/18/2022 -on outpatient PPI Discovery of potential rectal polyp mass at this admission   04/16/2022 -awaiting endoscopy and colonoscopy pending correction of INR/.. No gross bleeding  P:   Stress ulcer prophylaxis GI consult following   Anemia of GI bleed and now also hematuria at admission  04/16/2022: No active GI bleeding or hematuria  P:  - INR on hold - PRBC for hgb </= 6.9gm%    - exceptions are   -  if ACS susepcted/confirmed then transfuse for hgb </= 8.0gm%,  or    -  active bleeding with hemodynamic instability, then transfuse regardless of hemoglobin value   At at all times try to transfuse 1 unit prbc as possible with exception of active hemorrhage     At risk for hypo and hyperglycemia At risk for relative adrenal insufficiency  P:   SSI Might need stress dose steroids but myasthenia gravis weighs   Chronic prednisone therapy -at admission Mild protein calorie malnutrition -at admission  Plan  -PT OT consult as needed - monitor    Best practice (daily eval):  Diet: N.p.o. but can start once resp status better Pain/Anxiety/Delirium protocol (if indicated): x  VAP protocol (if indicated): Oral care DVT prophylaxis: Chronic anticoagulation warfarin currently on hold GI prophylaxis:  PPI Glucose control: SSI Mobility: Bedrest Code Status: Partial code basd on IPAL 04/15/22 Family Communication: Son updated at the bedside 04/15/22 and 04/16/22 Disposition: ICU  Otehrs : PICC ordered  [facility] PRimary - ccm consult. If intubated ccm primary     ATTESTATION & SIGNATURE   The patient Nayan Proch is critically ill with multiple organ systems failure and requires high complexity decision making for assessment and support, frequent evaluation and titration of therapies, application of advanced monitoring technologies and extensive interpretation of multiple databases.   Critical Care Time devoted to patient care services described in this note is  35  Minutes. This time reflects time of care of this signee Dr Brand Males. This critical care time does not reflect procedure time, or teaching time or supervisory time of PA/NP/Med student/Med Resident etc but could involve care discussion time     Dr. Brand Males, M.D., [facility].C.P Pulmonary and Critical Care  [facility] ICU Staff Physician, [place] [facility] Pager: [phone], If no answer or between  15:00h - 7:00h: call [phone]  04/16/2022 9:46 AM  LABS    PULMONARY Recent Labs  Lab 04/15/22 1706 04/15/22 2128 04/16/22 0418  PHART 7.28* 7.34* 7.39  PCO2ART 70* 63* 57*  PO2ART 102 108 112*  HCO3 33.4* 34.3* 34.3*  O2SAT 99 99.2 99.1    CBC Recent Labs  Lab 04/15/22 1708 04/15/22 1856 04/16/22 0239  HGB 8.1* 8.0* 7.6*  HCT 27.1* 27.3* 25.6*  WBC 12.1* 10.9* 9.3  PLT 197 186 198    COAGULATION Recent Labs  Lab 04/12/22 0504 04/13/22 1116 04/14/22 0445 04/15/22 0351 04/16/22 0239  INR 3.0* 3.1* 2.9* 2.5* 1.5*    CARDIAC  No results for input(s): "TROPONINI" in the last 168 hours. No results for input(s): "PROBNP" in the last 168 hours.  CHEMISTRY Recent Labs  Lab 04/12/22 0504 04/13/22 0413  04/14/22 0445 04/15/22 0351 04/15/22 1708 04/15/22 1856 04/16/22 0239  NA 140 137 137 137 132* 132* 136  K 4.1 4.2 5.1 5.4* 5.4* 5.3* 4.9  CL 105 104 102 102 100 100 99  CO2 '28 30 30 31 '$ 32 29 33*  GLUCOSE 90 101* 98 107* 126* 143* 108*  BUN 42* 42* 37* 37* 37* 34* 39*  CREATININE 1.24 1.47* 1.17 1.26* 1.29* 1.17 1.20  CALCIUM 10.9* 10.9* 11.5* 12.0* 11.3* 11.3* 11.8*  MG 2.0 2.3 2.2  --  2.3 2.0 2.2  PHOS 3.0 3.3 2.7  --  3.5  --  3.3   Estimated Creatinine Clearance: 48.3 mL/min (by C-G formula based on SCr of 1.2 mg/dL).   LIVER Recent Labs  Lab 04/12/22 0504 04/13/22 0413 04/13/22 1116 04/14/22 0445 04/15/22 0351 04/15/22 1708 04/15/22 1856 04/16/22 0239  AST 17 17  --  17  --  '19 18 15  '$ ALT 18 20  --  21  --  '23 23 22  '$ ALKPHOS 62 66  --  68  --  65 67 68  BILITOT 0.9 0.8  --  0.8  --  0.8 0.8 0.8  PROT 6.0* 6.3*  --  6.4*  --  6.1* 5.8* 5.9*  ALBUMIN 2.5* 2.7*  --  2.6*  --  2.6* 2.4* 2.3*  INR 3.0*  --  3.1* 2.9* 2.5*  --   --  1.5*     INFECTIOUS Recent Labs  Lab 04/11/22 2022 04/15/22 1856 04/16/22 0239  LATICACIDVEN 1.4  --   --   PROCALCITON  --  0.18 0.27     ENDOCRINE CBG (last 3)  Recent Labs    04/15/22 1708 04/16/22 0632  GLUCAP 132* 99         IMAGING x48h  - image(s) personally visualized  -   highlighted in bold DG CHEST PORT 1 VIEW  Result Date: 04/16/2022 CLINICAL DATA:  Shortness of breath. EXAM: PORTABLE CHEST 1 VIEW COMPARISON:  Chest x-ray April 15, 2022. FINDINGS: Slightly more dense left basilar consolidation with similar additional patchy airspace opacities throughout the lungs. Possible trace bilateral pleural effusions. No visible pneumothorax. Similar cardiomediastinal silhouette. Median sternotomy. Mitral valve replacement. IMPRESSION: Slightly more dense left basilar consolidation with similar additional patchy airspace opacities throughout the lungs. Electronically Signed   By: Margaretha Sheffield M.D.   On:  04/16/2022 08:15   CT ANGIO HEAD NECK W WO CM (CODE STROKE)  Result  Date: 04/15/2022 CLINICAL DATA:  Acute neurologic deficit EXAM: CT ANGIOGRAPHY HEAD AND NECK TECHNIQUE: Multidetector CT imaging of the head and neck was performed using the standard protocol during bolus administration of intravenous contrast. Multiplanar CT image reconstructions and MIPs were obtained to evaluate the vascular anatomy. Carotid stenosis measurements (when applicable) are obtained utilizing NASCET criteria, using the distal internal carotid diameter as the denominator. RADIATION DOSE REDUCTION: This exam was performed according to the departmental dose-optimization program which includes automated exposure control, adjustment of the mA and/or kV according to patient size and/or use of iterative reconstruction technique. CONTRAST:  141m OMNIPAQUE IOHEXOL 350 MG/ML SOLN COMPARISON:  None Available. FINDINGS: CTA NECK FINDINGS SKELETON: There is no bony spinal canal stenosis. No lytic or blastic lesion. OTHER NECK: Normal pharynx, larynx and major salivary glands. No cervical lymphadenopathy. Unremarkable thyroid gland. UPPER CHEST: Bilateral medium-sized pleural effusions and multiple subcentimeter mediastinal lymph nodes. Moderate pulmonary edema. AORTIC ARCH: There is calcific atherosclerosis of the aortic arch. There is no aneurysm, dissection or hemodynamically significant stenosis of the visualized portion of the aorta. Conventional 3 vessel aortic branching pattern. The visualized proximal subclavian arteries are widely patent. RIGHT CAROTID SYSTEM: Normal without aneurysm, dissection or stenosis. LEFT CAROTID SYSTEM: Normal without aneurysm, dissection or stenosis. VERTEBRAL ARTERIES: Left dominant configuration. Both origins are clearly patent. There is no dissection, occlusion or flow-limiting stenosis to the skull base (V1-V3 segments). CTA HEAD FINDINGS POSTERIOR CIRCULATION: --Vertebral arteries: Left vertebral artery  terminates in PICA. Otherwise normal. --Inferior cerebellar arteries: Normal. --Basilar artery: Normal. --Superior cerebellar arteries: Normal. --Posterior cerebral arteries (PCA): Normal. ANTERIOR CIRCULATION: --Intracranial internal carotid arteries: Normal. --Anterior cerebral arteries (ACA): Normal. Both A1 segments are present. Patent anterior communicating artery (a-comm). --Middle cerebral arteries (MCA): Normal. VENOUS SINUSES: As permitted by contrast timing, patent. ANATOMIC VARIANTS: None Review of the MIP images confirms the above findings. IMPRESSION: 1. No emergent large vessel occlusion or high-grade stenosis of the intracranial arteries. 2. Bilateral medium-sized pleural effusions and moderate pulmonary edema. 3. Upper mediastinal lymphadenopathy. Electronically Signed   By: KUlyses JarredM.D.   On: 04/15/2022 20:38   DG CHEST PORT 1 VIEW  Result Date: 04/15/2022 CLINICAL DATA:  141880 SOB (shortness of breath) [id number] EXAM: PORTABLE CHEST 1 VIEW COMPARISON:  Chest x-ray 02/13/2022 FINDINGS: The heart and mediastinal contours are within normal limits. Mitral valve replacement. Aortic calcification. Interval development of patchy airspace opacities most prominent within the right lower lung zone and retrocardiac airspace opacity. No pulmonary edema. Bilateral trace pleural effusions. No pneumothorax. No acute osseous abnormality.  Sternotomy wires are intact. IMPRESSION: 1. Multifocal pneumonia. Followup PA and lateral chest X-ray is recommended in 3-4 weeks following therapy to ensure resolution and exclude underlying malignancy. 2. Bilateral trace pleural effusions. 3.  Aortic Atherosclerosis (ICD10-I70.0). Electronically Signed   By: MIven FinnM.D.   On: 04/15/2022 19:20   [place]KG SITE RITE  Result Date: 04/15/2022 If Site Rite image not attached, placement could not be confirmed due to current cardiac rhythm.  CT HEAD WO CONTRAST (5MM)  Result Date: 04/15/2022 CLINICAL DATA:   Mental status change, unknown cause EXAM: CT HEAD WITHOUT CONTRAST TECHNIQUE: Contiguous axial images were obtained from the base of the skull through the vertex without intravenous contrast. RADIATION DOSE REDUCTION: This exam was performed according to the departmental dose-optimization program which includes automated exposure control, adjustment of the mA and/or kV according to patient size and/or use of iterative reconstruction technique. COMPARISON:  CT head 01/27/2022 BRAIN: BRAIN  Cerebral ventricle sizes are concordant with the degree of cerebral volume loss. Patchy and confluent areas of decreased attenuation are noted throughout the deep and periventricular white matter of the cerebral hemispheres bilaterally, compatible with chronic microvascular ischemic disease. No evidence of large-territorial acute infarction. No parenchymal hemorrhage. No mass lesion. Vague right frontal sulcal hyperdensity may represent a trace developing subarachnoid hemorrhage (4:40, 2:28). No mass effect or midline shift. No hydrocephalus. Basilar cisterns are patent. Vascular: No hyperdense vessel. Skull: No acute fracture or focal lesion. Sinuses/Orbits: Paranasal sinuses and mastoid air cells are clear. Bilateral lens replacement. Otherwise the orbits are unremarkable. Other: None. IMPRESSION: Vague right frontal sulcal hyperdensity may represent a trace developing subarachnoid hemorrhage. These results will be called to the ordering clinician or representative by the Radiologist Assistant, and communication documented in the PACS or [employer]. Electronically Signed   By: Iven Finn M.D.   On: 04/15/2022 17:25

## 2022-04-16 NOTE — Progress Notes (Signed)
Physical Therapy Treatment Patient Details Name: Daniel Sharp MRN: [id number] DOB: 02-Sep-1932 Today's Date: 04/16/2022   History of Present Illness Patient is a 88 y.o. male presenting to [facility] ED on 10/5 with hematuria, subacute upper GI bleed and sepsis due to hemorrhagic cystitis. Further work-up pending. PMHx significant for L renal cell carcinoma s/p L nephrectomy 01/22/22 (did not undergo chemo or radiation), Afib, OA, HTN, MG, back surgery, cholecystectomy, appendectomy, chronic iron deficiency, and recent fall with dislocated Rt finger, fractured Rt foot, Rt 5th rib fracture. Rapid response on 10/9 for decreased responses.Head CT showed "Vague right frontal sulcal hyperdensity may represent a trace  developing subarachnoid hemorrhage"-    PT Comments    The patient is awake, family present.  Patient slow to follow simple directions. Max assistance to roll and move to sitting onto side of bed. Patient with intermittent decreased responses, but with speaking to, would respond. Patient  appeared to fatigue so assisted back into bed. Patient  waiting to go for a test. Continue PT. Patient's SPO2 > .90%  Recommendations for follow up therapy are one component of a multi-disciplinary discharge planning process, led by the attending physician.  Recommendations may be updated based on patient status, additional functional criteria and insurance authorization.  Follow Up Recommendations  Skilled nursing-short term rehab (<3 hours/day) Can patient physically be transported by private vehicle: No   Assistance Recommended at Discharge Frequent or constant Supervision/Assistance  Patient can return home with the following A lot of help with walking and/or transfers;A little help with bathing/dressing/bathroom;Help with stairs or ramp for entrance;Assistance with cooking/housework;Assist for transportation   Equipment Recommendations  None recommended by PT    Recommendations for Other Services        Precautions / Restrictions Precautions Precautions: Fall     Mobility  Bed Mobility   Bed Mobility: Supine to Sit, Sit to Supine     Supine to sit: Max assist Sit to supine: Total assist   General bed mobility comments: assist for all aspects, increased time    Transfers                   General transfer comment: unable to stand    Ambulation/Gait                   Stairs             Wheelchair Mobility    Modified Rankin (Stroke Patients Only)       Balance   Sitting-balance support: Bilateral upper extremity supported, Feet supported Sitting balance-Leahy Scale: Poor Sitting balance - Comments: left lean                                    Cognition Arousal/Alertness: Awake/alert Behavior During Therapy: Flat affect Overall Cognitive Status: Impaired/Different from baseline Area of Impairment: Orientation                 Orientation Level: Place, Time, Situation   Memory: Decreased short-term memory Following Commands: Follows one step commands with increased time   Awareness: Intellectual Problem Solving: Slow processing, Decreased initiation          Exercises      General Comments        Pertinent Vitals/Pain Pain Assessment Pain Assessment: No/denies pain    Home Living  Prior Function            PT Goals (current goals can now be found in the care plan section) Progress towards PT goals: Progressing toward goals    Frequency    Min 2X/week      PT Plan Current plan remains appropriate    Co-evaluation              AM-PAC PT "6 Clicks" Mobility   Outcome Measure  Help needed turning from your back to your side while in a flat bed without using bedrails?: Total Help needed moving from lying on your back to sitting on the side of a flat bed without using bedrails?: Total Help needed moving to and from a bed to a chair  (including a wheelchair)?: Total Help needed standing up from a chair using your arms (e.g., wheelchair or bedside chair)?: Total Help needed to walk in hospital room?: Total Help needed climbing 3-5 steps with a railing? : Total 6 Click Score: 6    End of Session Equipment Utilized During Treatment: Gait belt Activity Tolerance: Patient limited by fatigue Patient left: in bed;with call bell/phone within reach;with bed alarm set;with family/visitor present Nurse Communication: Mobility status PT Visit Diagnosis: Unsteadiness on feet (R26.81);Muscle weakness (generalized) (M62.81);Difficulty in walking, not elsewhere classified (R26.2)     Time: 8502-7741 PT Time Calculation (min) (ACUTE ONLY): 18 min  Charges:  $Therapeutic Activity: 8-22 mins                      Tresa Endo PT Acute Rehabilitation Services Office [phone] Weekend [phone]    Claretha Cooper 04/16/2022, 4:29 PM

## 2022-04-16 NOTE — Progress Notes (Signed)
NIF (best of 3 attempts)= +-40 all 3 attempts  FVC (best 3 attempts)= .9L

## 2022-04-16 NOTE — Evaluation (Signed)
SLP Cancellation Note  Patient Details Name: Daniel Sharp MRN: [id number] DOB: Jun 25, 1933   Cancelled treatment:       Reason Eval/Treat Not Completed: Other (comment);Medical issues which prohibited therapy (pt currently on Bipap, will await RN to proceed dependent on MD care plan determination)  Kathleen Lime, MS [place] Office [phone] Pager [phone]  Macario Golds 04/16/2022, 8:52 AM

## 2022-04-16 NOTE — Progress Notes (Signed)
Initial Nutrition Assessment  DOCUMENTATION CODES:  Not applicable  INTERVENTION:  -Advance diet per SLP recommendations -Provide ONS compliant with SLP recommendations to meet estimated nutrient needs -If diet cannot be advanced by hospital day 7, recommend nutrition support  NUTRITION DIAGNOSIS:  Inadequate oral intake related to other (see comment) (difficulty breathing and AMS) as evidenced by NPO status.  GOAL:  Patient will meet greater than or equal to 90% of their needs  MONITOR:  Diet advancement  REASON FOR ASSESSMENT:  Malnutrition Screening Tool    ASSESSMENT:  Pt is an 88yo M with PMH of HTN, A fib, chronic blood loss anemia, kidney cancer s/p L nephrectomy, chronic right sided heart failure, BPH, CKD 3, ocular myasthenia gravis, and arthritis who presents with micro hematuria and dark colored stool.   Pt required Bipap yesterday and overnight, but weaned to 2L [place] today. Inadequate po intake noted x4 days during admission. Hydration maintained on D5LR. Awaiting SLP eval for diet advancement. Noted DTPI on coccyx and stage 1 PI on heel. RD to follow up with pt when diet is advanced to provide appropriate ONS to meet estimated needs and maintain skin integrity. If diet cannot be advanced by hospital day 7, consider nutrition support (EN vs TPN).   Medications reviewed and include: protonix, prednisone, D5LR @ 56m  Labs reviewed: BG:108, BUN:39, Cr:1.2, GFR:59, BNP:313.1, troponin:67  NUTRITION - FOCUSED PHYSICAL EXAM: Complete on follow up  Diet Order:   Diet Order             Diet NPO time specified  Diet effective ____                   EDUCATION NEEDS:  Not appropriate for education at this time  Skin:  Skin Assessment: Skin Integrity Issues: Skin Integrity Issues:: DTI, Stage I DTI: coccyx Stage I: right heel  Last BM:  04/15/22  Height:  Ht Readings from Last 1 Encounters:  04/11/22 6' 0.5" (1.842 m)   Weight:  Wt Readings from Last 1  Encounters:  04/14/22 85.9 kg   BMI:  Body mass index is 25.33 kg/m.  Estimated Nutritional Needs:  Kcal:  [address]BTDH Protein:  105-130g  Fluid:  19763m KaCandise BowensMS, RD, LDN, CNSC See AMiON for contact information

## 2022-04-16 NOTE — TOC Progression Note (Signed)
Transition of Care [facility]) - Progression Note    Patient Details  Name: Daniel Sharp MRN: [id number] Date of Birth: 07/25/1932  Transition of Care [facility]) CM/SW Contact  Purcell Mouton, RN Phone Number: 04/16/2022, 10:25 AM  Clinical Narrative:     TOC will continue to follow pt.   Expected Discharge Plan: [place] Barriers to Discharge: No Barriers Identified  Expected Discharge Plan and Services Expected Discharge Plan: [place] arrangements for the past 2 months: Single Family Home                                       Social Determinants of Health (SDOH) Interventions    Readmission Risk Interventions     No data to display

## 2022-04-16 NOTE — Progress Notes (Signed)
  Echocardiogram 2D Echocardiogram has been performed.  Daniel Sharp 04/16/2022, 3:17 PM

## 2022-04-16 NOTE — Progress Notes (Signed)
Per RN, MD requested RN removed PT from BiPAP and placed on 2 LPM nasal cannula.

## 2022-04-17 ENCOUNTER — Inpatient Hospital Stay (HOSPITAL_COMMUNITY): Payer: Medicare HMO

## 2022-04-17 ENCOUNTER — Ambulatory Visit: Payer: Medicare HMO

## 2022-04-17 DIAGNOSIS — J9601 Acute respiratory failure with hypoxia: Secondary | ICD-10-CM

## 2022-04-17 DIAGNOSIS — D509 Iron deficiency anemia, unspecified: Secondary | ICD-10-CM | POA: Diagnosis not present

## 2022-04-17 DIAGNOSIS — K922 Gastrointestinal hemorrhage, unspecified: Secondary | ICD-10-CM | POA: Diagnosis not present

## 2022-04-17 DIAGNOSIS — G7000 Myasthenia gravis without (acute) exacerbation: Secondary | ICD-10-CM

## 2022-04-17 DIAGNOSIS — I4729 Other ventricular tachycardia: Secondary | ICD-10-CM

## 2022-04-17 DIAGNOSIS — G9341 Metabolic encephalopathy: Secondary | ICD-10-CM

## 2022-04-17 DIAGNOSIS — N3091 Cystitis, unspecified with hematuria: Secondary | ICD-10-CM | POA: Diagnosis not present

## 2022-04-17 DIAGNOSIS — I609 Nontraumatic subarachnoid hemorrhage, unspecified: Secondary | ICD-10-CM

## 2022-04-17 DIAGNOSIS — J9602 Acute respiratory failure with hypercapnia: Secondary | ICD-10-CM

## 2022-04-17 DIAGNOSIS — R933 Abnormal findings on diagnostic imaging of other parts of digestive tract: Secondary | ICD-10-CM | POA: Diagnosis not present

## 2022-04-17 DIAGNOSIS — I5033 Acute on chronic diastolic (congestive) heart failure: Secondary | ICD-10-CM

## 2022-04-17 DIAGNOSIS — N1831 Chronic kidney disease, stage 3a: Secondary | ICD-10-CM

## 2022-04-17 DIAGNOSIS — R3129 Other microscopic hematuria: Secondary | ICD-10-CM | POA: Diagnosis not present

## 2022-04-17 DIAGNOSIS — D329 Benign neoplasm of meninges, unspecified: Secondary | ICD-10-CM

## 2022-04-17 DIAGNOSIS — A419 Sepsis, unspecified organism: Secondary | ICD-10-CM | POA: Diagnosis not present

## 2022-04-17 DIAGNOSIS — N400 Enlarged prostate without lower urinary tract symptoms: Secondary | ICD-10-CM

## 2022-04-17 LAB — CBC WITH DIFFERENTIAL/PLATELET
Abs Immature Granulocytes: 0.07 10*3/uL (ref 0.00–0.07)
Basophils Absolute: 0 10*3/uL (ref 0.0–0.1)
Basophils Relative: 0 %
Eosinophils Absolute: 0.1 10*3/uL (ref 0.0–0.5)
Eosinophils Relative: 1 %
HCT: 27.6 % — ABNORMAL LOW (ref 39.0–52.0)
Hemoglobin: 8.4 g/dL — ABNORMAL LOW (ref 13.0–17.0)
Immature Granulocytes: 1 %
Lymphocytes Relative: 12 %
Lymphs Abs: 1.2 10*3/uL (ref 0.7–4.0)
MCH: 29 pg (ref 26.0–34.0)
MCHC: 30.4 g/dL (ref 30.0–36.0)
MCV: 95.2 fL (ref 80.0–100.0)
Monocytes Absolute: 0.7 10*3/uL (ref 0.1–1.0)
Monocytes Relative: 7 %
Neutro Abs: 7.9 10*3/uL — ABNORMAL HIGH (ref 1.7–7.7)
Neutrophils Relative %: 79 %
Platelets: 155 10*3/uL (ref 150–400)
RBC: 2.9 MIL/uL — ABNORMAL LOW (ref 4.22–5.81)
RDW: 14.6 % (ref 11.5–15.5)
WBC: 10 10*3/uL (ref 4.0–10.5)
nRBC: 0 % (ref 0.0–0.2)

## 2022-04-17 LAB — COMPREHENSIVE METABOLIC PANEL
ALT: 20 U/L (ref 0–44)
ALT: 20 U/L (ref 0–44)
ALT: 18 U/L (ref 0–44)
AST: 16 U/L (ref 15–41)
AST: 16 U/L (ref 15–41)
AST: 15 U/L (ref 15–41)
Albumin: 2.5 g/dL — ABNORMAL LOW (ref 3.5–5.0)
Albumin: 2.7 g/dL — ABNORMAL LOW (ref 3.5–5.0)
Albumin: 2.5 g/dL — ABNORMAL LOW (ref 3.5–5.0)
Alkaline Phosphatase: 66 U/L (ref 38–126)
Alkaline Phosphatase: 69 U/L (ref 38–126)
Alkaline Phosphatase: 61 U/L (ref 38–126)
Anion gap: 8 (ref 5–15)
Anion gap: 8 (ref 5–15)
Anion gap: 7 (ref 5–15)
BUN: 41 mg/dL — ABNORMAL HIGH (ref 8–23)
BUN: 45 mg/dL — ABNORMAL HIGH (ref 8–23)
BUN: 39 mg/dL — ABNORMAL HIGH (ref 8–23)
CO2: 32 mmol/L (ref 22–32)
CO2: 34 mmol/L — ABNORMAL HIGH (ref 22–32)
CO2: 34 mmol/L — ABNORMAL HIGH (ref 22–32)
Calcium: 11.8 mg/dL — ABNORMAL HIGH (ref 8.9–10.3)
Calcium: 12 mg/dL — ABNORMAL HIGH (ref 8.9–10.3)
Calcium: 11.4 mg/dL — ABNORMAL HIGH (ref 8.9–10.3)
Chloride: 97 mmol/L — ABNORMAL LOW (ref 98–111)
Chloride: 95 mmol/L — ABNORMAL LOW (ref 98–111)
Chloride: 95 mmol/L — ABNORMAL LOW (ref 98–111)
Creatinine, Ser: 1.23 mg/dL (ref 0.61–1.24)
Creatinine, Ser: 1.4 mg/dL — ABNORMAL HIGH (ref 0.61–1.24)
Creatinine, Ser: 1.37 mg/dL — ABNORMAL HIGH (ref 0.61–1.24)
GFR, Estimated: 57 mL/min — ABNORMAL LOW (ref >60)
GFR, Estimated: 49 mL/min — ABNORMAL LOW (ref >60)
GFR, Estimated: 50 mL/min — ABNORMAL LOW (ref >60)
Glucose, Bld: 83 mg/dL (ref 70–99)
Glucose, Bld: 124 mg/dL — ABNORMAL HIGH (ref 70–99)
Glucose, Bld: 119 mg/dL — ABNORMAL HIGH (ref 70–99)
Potassium: 4 mmol/L (ref 3.5–5.1)
Potassium: 3.7 mmol/L (ref 3.5–5.1)
Potassium: 4 mmol/L (ref 3.5–5.1)
Sodium: 137 mmol/L (ref 135–145)
Sodium: 137 mmol/L (ref 135–145)
Sodium: 136 mmol/L (ref 135–145)
Total Bilirubin: 0.8 mg/dL (ref 0.3–1.2)
Total Bilirubin: 0.9 mg/dL (ref 0.3–1.2)
Total Bilirubin: 0.7 mg/dL (ref 0.3–1.2)
Total Protein: 6.1 g/dL — ABNORMAL LOW (ref 6.5–8.1)
Total Protein: 6.9 g/dL (ref 6.5–8.1)
Total Protein: 6.2 g/dL — ABNORMAL LOW (ref 6.5–8.1)

## 2022-04-17 LAB — PROTIME-INR
INR: 1.2 (ref 0.8–1.2)
Prothrombin Time: 15.4 seconds — ABNORMAL HIGH (ref 11.4–15.2)

## 2022-04-17 LAB — MAGNESIUM: Magnesium: 2.1 mg/dL (ref 1.7–2.4)

## 2022-04-17 LAB — PHOSPHORUS: Phosphorus: 2.8 mg/dL (ref 2.5–4.6)

## 2022-04-17 LAB — PROCALCITONIN: Procalcitonin: 0.23 ng/mL

## 2022-04-17 LAB — LEGIONELLA PNEUMOPHILA SEROGP 1 UR AG: L. pneumophila Serogp 1 Ur Ag: NEGATIVE

## 2022-04-17 LAB — CALCIUM, IONIZED: Calcium, Ionized, Serum: 7.3 mg/dL — ABNORMAL HIGH (ref 4.5–5.6)

## 2022-04-17 MED ORDER — SODIUM CHLORIDE 0.9 % IV BOLUS
500.0000 mL | Freq: Once | INTRAVENOUS | Status: AC
  Administered 2022-04-17: 500 mL via INTRAVENOUS

## 2022-04-17 MED ORDER — CALCIUM GLUCONATE-NACL 1-0.675 GM/50ML-% IV SOLN
1.0000 g | Freq: Once | INTRAVENOUS | Status: AC
  Administered 2022-04-17: 1000 mg via INTRAVENOUS
  Filled 2022-04-17: qty 50

## 2022-04-17 MED ORDER — BISACODYL 5 MG PO TBEC
20.0000 mg | DELAYED_RELEASE_TABLET | Freq: Once | ORAL | Status: AC
  Administered 2022-04-17: 20 mg via ORAL
  Filled 2022-04-17: qty 4

## 2022-04-17 MED ORDER — POLYETHYLENE GLYCOL 3350 17 G PO PACK
34.0000 g | PACK | Freq: Once | ORAL | Status: AC
  Administered 2022-04-17: 34 g via ORAL
  Filled 2022-04-17: qty 2

## 2022-04-17 NOTE — Progress Notes (Signed)
NIF (best of 3 attempts)= +-40  FVC (best of 3 attempts)= 0.8L

## 2022-04-17 NOTE — Evaluation (Signed)
Clinical/Bedside Swallow Evaluation Patient Details  Name: Daniel Sharp MRN: [id number] Date of Birth: 01/31/1933  Today's Date: 04/17/2022 Time: SLP Start Time (ACUTE ONLY): 1110 SLP Stop Time (ACUTE ONLY): 1135 SLP Time Calculation (min) (ACUTE ONLY): 25 min  Past Medical History:  Past Medical History:  Diagnosis Date   A-fib ([place])    history of   Arthritis    Dyspnea    H/O mitral valve repair 07/2003   Hypertension    Ocular myasthenia gravis ([place])    takes pred   Pneumonia    Past Surgical History:  Past Surgical History:  Procedure Laterality Date   APPENDECTOMY     BACK SURGERY     cyst removed L3 and L4   CHOLECYSTECTOMY     FINGER SURGERY Right    R hand   MITRAL VALVE REPAIR     on coumadin   NOSE SURGERY     ROBOTIC ASSITED PARTIAL NEPHRECTOMY Left 01/22/2022   Procedure: XI ROBOTIC ASSITED NEPHRECTOMY;  Surgeon: Ceasar Mons, MD;  Location: [facility];  Service: Urology;  Laterality: Left;   HPI:  88 year old male with history of kidney cancer status post left nephrectomy in July 2023, paroxysmal atrial fibrillation, chronically anticoagulated on warfarin, diastolic dysfunction, essential hypertension, ocular myasthenia gravis on chronic prednisone, BPH on Flomax, chronic kidney disease with baseline creatinine 1.3-1.6.  Also chronic iron deficiency anemia.  He is on chronic steroids for his ocular myasthenia and?  Mestinon     He has few month history of dark-colored stool for which outpatient EGD and colonoscopy was scheduled for April 18, 2022.  Prior to admission required 3 units of PRBC but he was admitted 04/11/2022 with severe gross hematuria suspected hemorrhagic cystitis /admission creatinine of 1.4 mg percent.  Warfarin was held.  Empiric Rocephin was started.  CT abdomen did suggest a rectal mass.  Coumadin was held.  He also had slightly high for thalassemia at 11.8 treated with hydration; CXR 04/17/22 similar bilateral pleural effusions with  adjacent infiltrate or atelectasis.  Required Bipap on 04/16/22 when initial BSE attempted.  ST f/u for BSE to assess current swallow function.    Assessment / Plan / Recommendation  Clinical Impression  Pt seen for clinical swallowing evaluation with a normal oropharyngeal swallow noted throughout assessment with min impaired mastication (d/t missing dentition), timely swallow, no s/sx of aspiration noted with any consistency assessed.  Pt is deconditioned/weak and vocal quality was observed to be low vocal intensity during conversation.  Recommend initiating a diet of Dysphagia 3 (mechanical soft)/thin liquids with use of general swallowing precautions including slow rate and small bites/sips with upright positioning during all PO intake.  STwill f/u x1 for diet tolerance to assess pt fatigue and if this impacts overall swallow function with this consistency diet/aspiration precaution education with pt/caregivers.  Thank you for this consult. SLP Visit Diagnosis: Dysphagia, unspecified (R13.10)    Aspiration Risk  Mild aspiration risk    Diet Recommendation   Dysphagia 3/thin liquids  Medication Administration: Whole meds with liquid    Other  Recommendations Oral Care Recommendations: Oral care BID;Staff/trained caregiver to provide oral care    Recommendations for follow up therapy are one component of a multi-disciplinary discharge planning process, led by the attending physician.  Recommendations may be updated based on patient status, additional functional criteria and insurance authorization.  Follow up Recommendations Follow physician's recommendations for discharge plan and follow up therapies      Assistance Recommended at Discharge Intermittent  Supervision/Assistance  Functional Status Assessment Patient has had a recent decline in their functional status and demonstrates the ability to make significant improvements in function in a reasonable and predictable amount of time.   Frequency and Duration min 1 x/week  1 week       Prognosis Prognosis for Safe Diet Advancement: Good      Swallow Study   General Date of Onset: 04/11/22 HPI: 88 year old male with history of kidney cancer status post left nephrectomy in July 2023, paroxysmal atrial fibrillation, chronically anticoagulated on warfarin, diastolic dysfunction, essential hypertension, ocular myasthenia gravis on chronic prednisone, BPH on Flomax, chronic kidney disease with baseline creatinine 1.3-1.6.  Also chronic iron deficiency anemia.  He is on chronic steroids for his ocular myasthenia and?  Mestinon     He has few month history of dark-colored stool for which outpatient EGD and colonoscopy was scheduled for April 18, 2022.  Prior to admission required 3 units of PRBC but he was admitted 04/11/2022 with severe gross hematuria suspected hemorrhagic cystitis /admission creatinine of 1.4 mg percent.  Warfarin was held.  Empiric Rocephin was started.  CT abdomen did suggest a rectal mass.  Coumadin was held.  He also had slightly high for thalassemia at 11.8 treated with hydration; CXR 04/17/22 similar bilateral pleural effusions with adjacent infiltrate or atelectasis.  Required Bipap on 04/16/22 when initial BSE attempted.  ST f/u for BSE to assess current swallow function. Type of Study: Bedside Swallow Evaluation Previous Swallow Assessment: n/a Diet Prior to this Study: NPO Temperature Spikes Noted: No Respiratory Status: Nasal cannula (2L) History of Recent Intubation: No Behavior/Cognition: Alert;Cooperative;Pleasant mood Oral Cavity Assessment: Within Functional Limits Oral Care Completed by SLP: Recent completion by staff Oral Cavity - Dentition: Adequate natural dentition;Missing dentition Vision: Functional for self-feeding Self-Feeding Abilities: Able to feed self;Needs assist Patient Positioning: Upright in chair Baseline Vocal Quality: Low vocal intensity Volitional Cough:  Strong Volitional Swallow: Able to elicit    Oral/Motor/Sensory Function Overall Oral Motor/Sensory Function: Within functional limits   Ice Chips Ice chips: Within functional limits Presentation: Spoon   Thin Liquid Thin Liquid: Within functional limits Presentation: Straw    Nectar Thick Nectar Thick Liquid: Not tested   Honey Thick Honey Thick Liquid: Not tested   Puree Puree: Within functional limits Presentation: Self Fed   Solid     Solid: Impaired Presentation: Self Fed Oral Phase Impairments: Impaired mastication Oral Phase Functional Implications: Impaired mastication;Prolonged oral transit      Elvina Sidle, M.S., CCC-SLP 04/17/2022,12:56 PM

## 2022-04-17 NOTE — Progress Notes (Signed)
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  He was admitted on 04/11/22 with melanotic stools and gross hematuria.  He had been undergoing outpatient GI evaluation for EGD and colonoscopy. After admission he became grossly weaker with worsened respiratory status as well.  He was found to have hypercarbic respiratory failure and transferred to the ICU.  Pulmonology and neurology were also consulted.  He was placed on BiPAP with improvement in his mentation and respiratory status.  He was also noted to have volume overload and was started on diuresis, again with good response. He was not felt to warrant IVIG. He underwent brain imaging and was found to have [place], dural based mass in the right frontal lobe consistent with typical meningioma, and multiple small microhemorrhages consistent with cerebral amyloid angiopathy considered mild in severity. He was evaluated by neurosurgery due to these findings and recommended to have no further intervention or work-up for the meningioma/dural based mass.  SAH was also considered stable.  Interval History:  Patient appears to have improved after being started on BiPAP.  Mentation better especially as per son who is bedside this morning.  He is sitting up in recliner bedside as well. He is on nasal cannula and NIF scores have also been stable.  Assessment and Plan:  Acute hypoxic hypercarbic respiratory failure -Presumed multifactorial in setting of volume overload and hypercarbia.  There was some initial concern for contribution from myasthenia gravis as well. -Patient responded well to diuresis and BiPAP  use - appreciate pulmonology and neuro insight; patient considered appropriate for EGD/colonoscopy; if were to decompensate then intubation would be recommended. Given events of hospitalization, suspect inpatient scopes would be more prudent in case of complications  GIB Chronic IDA - on oral iron at home - low iron stores on iron studies on 9/20 and 10/6 - s/p 2 units PRBC on 04/09/22 (1 unit on 9/26) - GI following; tentative plans are for EGD/colonoscopy when able (when INR appropriate) and when felt to be stable enough - concern for possible rectal mass on CT on 10/5 as well - currently, clinical status has improved (see respiratory failure as well); patient felt to be able to undergo procedures as per pulmonology and neurology   Acute metabolic encephalopathy - resolved  - due to hypercarbia and deconditioning - now improved s/p BiPAP use - repeat ABG if further lethargy/somnolence  - Given good clinical response in general over the past 24 hours, hold off on palliative care consult at this time  Hematuria - resolved  BPH with LUTS RCC s/p left nephrectomy 01/22/22 - Appreciate urology evaluation - Some suspicion that his hematuria may be due to prostatic origin per urology - Continue tamsulosin - Finasteride added per urology - No further hematuria since 04/11/2022.  If develops again, will reconsult urology  Acute on chronic dCHF -New echo during hospitalization; EF 55%, no RWMA; severe concentric LVH.  Indeterminate diastolic parameters.  Mild enlarged right ventricle -BNP 313; pulmonary crackles, respiratory distress/dyspnea/SOB, right pleural effusion - good response with lasix  Ocular myasthenia gravis - neurology following as well - no indication for IVIG - continue NIFs as per  pulmonology - continue prednisone and mestinon  -Caution with medications that can exacerbate - Neurology recommends elective intubation if were to worsen  SAH - stable Meningioma   -Appreciate neurosurgery input.  SAH stable and there are multiple small microhemorrhages consistent with cerebral amyloid angiopathy -No further work-up or intervention recommended per neurosurgery - per neurosurgery: "he likely has some mild CAA, this increases the risk of an intracerebral hemorrhage in patients with therapeutic anticoagulation"  Culture-negative sepsis Possible hemorrhagic cystitis  Concern for HAP - currently on cefepime  - now afebrile and mild leukocytosis has normalized - likely will complete normal course (too many confounding factors)  NSVT - asymptomatic - monitor electrolytes   Hypercalcemia -S/p Lasix - Continue trending  PAF Hx MV repair - on chronic Coumadin -Anticoagulation currently on hold for above work-ups to be completed  CKD3a - patient has history of CKD3a. Baseline creat ~ 1.3 - 1.6 - possible contribution from L nephrectomy as well - at risk for ATN with hypoxia  - continue trending renal function   HTN - continue current treatment  Old records reviewed in assessment of this patient  Antimicrobials: Rocephin 10/5 >>10/9 Cefepime 10/10 >> current   DVT prophylaxis:  SCDs Start: 04/11/22 2325   Code Status:   Code Status: Partial Code  Mobility Assessment (last 72 hours)     Mobility Assessment     Row Name 04/17/22 1313 04/17/22 1119 04/16/22 1628 04/15/22 1300 04/15/22 0749   Does patient have an order for bedrest or is patient medically unstable -- -- -- -- No - Continue assessment   What is the highest level of mobility based on the progressive mobility assessment? Level 4 (Walks with assist in room) - Balance while marching in place and cannot step forward and back - Complete Level 4 (Walks with assist in room) - Balance while marching in place and cannot step forward and back - Complete Level 1 (Bedfast) - Unable to balance while sitting on edge of bed Level 2 (Chairfast) - Balance while sitting on edge of bed and  cannot stand Level 3 (Stands with assist) - Balance while standing  and cannot march in place    Row Name 04/14/22 1500           What is the highest level of mobility based on the progressive mobility assessment? Level 3 (Stands with assist) - Balance while standing  and cannot march in place                Barriers to discharge:  Disposition Plan:  pending clinical course Status is: Inpt  Objective: Blood pressure (!) 126/47, pulse 84, temperature 97.6 F (36.4 C), temperature source Oral, resp. rate (!) 26, height 6' 0.5" (1.842 m), weight 84.4 kg, SpO2 98 %.  Examination:  Physical Exam Constitutional:      General: He is not in acute distress.    Appearance: Normal appearance.  HENT:     Head: Normocephalic and atraumatic.     Mouth/Throat:     Mouth: Mucous membranes are moist.  Eyes:     Extraocular Movements: Extraocular movements intact.     Comments: Mild left ptosis  Cardiovascular:     Rate and Rhythm: Normal rate and regular rhythm.     Heart sounds: Normal heart sounds.  Pulmonary:     Effort: Pulmonary effort is normal. No respiratory distress.     Breath sounds: Rhonchi present. No wheezing.  Abdominal:     General: Bowel sounds are  normal. There is no distension.     Palpations: Abdomen is soft.     Tenderness: There is no abdominal tenderness.  Musculoskeletal:     Cervical back: Normal range of motion and neck supple.     Comments: Trace L>R LE edema  Skin:    General: Skin is warm and dry.  Neurological:     Mental Status: He is alert.     Comments: Generalized weakness, worse in LE  Psychiatric:        Mood and Affect: Mood normal.        Behavior: Behavior normal.      Consultants:  PCCM Urology GI Neurosurgery - signed off  Procedures:    Data Reviewed: Results for orders placed or performed during the hospital encounter of 04/11/22 (from the past 24 hour(s))  Glucose, capillary     Status: Abnormal   Collection Time:  04/16/22  7:32 PM  Result Value Ref Range   Glucose-Capillary 107 (H) 70 - 99 mg/dL   Comment 1 Notify RN    Comment 2 Document in Chart   Protime-INR     Status: Abnormal   Collection Time: 04/17/22  2:57 AM  Result Value Ref Range   Prothrombin Time 15.4 (H) 11.4 - 15.2 seconds   INR 1.2 0.8 - 1.2  Procalcitonin     Status: None   Collection Time: 04/17/22  2:57 AM  Result Value Ref Range   Procalcitonin 0.23 ng/mL  Comprehensive metabolic panel     Status: Abnormal   Collection Time: 04/17/22  2:57 AM  Result Value Ref Range   Sodium 137 135 - 145 mmol/L   Potassium 4.0 3.5 - 5.1 mmol/L   Chloride 97 (L) 98 - 111 mmol/L   CO2 32 22 - 32 mmol/L   Glucose, Bld 83 70 - 99 mg/dL   BUN 41 (H) 8 - 23 mg/dL   Creatinine, Ser 1.23 0.61 - 1.24 mg/dL   Calcium 11.8 (H) 8.9 - 10.3 mg/dL   Total Protein 6.1 (L) 6.5 - 8.1 g/dL   Albumin 2.5 (L) 3.5 - 5.0 g/dL   AST 16 15 - 41 U/L   ALT 20 0 - 44 U/L   Alkaline Phosphatase 66 38 - 126 U/L   Total Bilirubin 0.8 0.3 - 1.2 mg/dL   GFR, Estimated 57 (L) >60 mL/min   Anion gap 8 5 - 15  CBC with Differential/Platelet     Status: Abnormal   Collection Time: 04/17/22  2:57 AM  Result Value Ref Range   WBC 10.0 4.0 - 10.5 K/uL   RBC 2.90 (L) 4.22 - 5.81 MIL/uL   Hemoglobin 8.4 (L) 13.0 - 17.0 g/dL   HCT 27.6 (L) 39.0 - 52.0 %   MCV 95.2 80.0 - 100.0 fL   MCH 29.0 26.0 - 34.0 pg   MCHC 30.4 30.0 - 36.0 g/dL   RDW 14.6 11.5 - 15.5 %   Platelets 155 150 - 400 K/uL   nRBC 0.0 0.0 - 0.2 %   Neutrophils Relative % 79 %   Neutro Abs 7.9 (H) 1.7 - 7.7 K/uL   Lymphocytes Relative 12 %   Lymphs Abs 1.2 0.7 - 4.0 K/uL   Monocytes Relative 7 %   Monocytes Absolute 0.7 0.1 - 1.0 K/uL   Eosinophils Relative 1 %   Eosinophils Absolute 0.1 0.0 - 0.5 K/uL   Basophils Relative 0 %   Basophils Absolute 0.0 0.0 - 0.1 K/uL   Immature Granulocytes  1 %   Abs Immature Granulocytes 0.07 0.00 - 0.07 K/uL  Phosphorus     Status: None   Collection Time:  04/17/22  2:57 AM  Result Value Ref Range   Phosphorus 2.8 2.5 - 4.6 mg/dL  Magnesium     Status: None   Collection Time: 04/17/22  2:57 AM  Result Value Ref Range   Magnesium 2.1 1.7 - 2.4 mg/dL  Comprehensive metabolic panel     Status: Abnormal   Collection Time: 04/17/22 12:03 PM  Result Value Ref Range   Sodium 137 135 - 145 mmol/L   Potassium 3.7 3.5 - 5.1 mmol/L   Chloride 95 (L) 98 - 111 mmol/L   CO2 34 (H) 22 - 32 mmol/L   Glucose, Bld 124 (H) 70 - 99 mg/dL   BUN 45 (H) 8 - 23 mg/dL   Creatinine, Ser 1.40 (H) 0.61 - 1.24 mg/dL   Calcium 12.0 (H) 8.9 - 10.3 mg/dL   Total Protein 6.9 6.5 - 8.1 g/dL   Albumin 2.7 (L) 3.5 - 5.0 g/dL   AST 16 15 - 41 U/L   ALT 20 0 - 44 U/L   Alkaline Phosphatase 69 38 - 126 U/L   Total Bilirubin 0.9 0.3 - 1.2 mg/dL   GFR, Estimated 49 (L) >60 mL/min   Anion gap 8 5 - 15    I have Reviewed nursing notes, Vitals, and Lab results since pt's last encounter. Pertinent lab results : see above I have ordered test including BMP, CBC, Mg I have reviewed the last note from staff over past 24 hours I have discussed pt's care plan and test results with nursing staff, case manager   LOS: 5 days   Dwyane Dee, MD [facility] 04/17/2022, 1:54 PM

## 2022-04-17 NOTE — Progress Notes (Signed)
   Patient Name: Daniel Sharp Date of Encounter: 04/17/2022, 1:40 PM    Subjective  More alert No signs of bleeding   Objective  BP (!) 126/47   Pulse 84   Temp 97.6 F (36.4 C) (Oral)   Resp (!) 26   Ht 6' 0.5" (1.842 m)   Wt 84.4 kg   SpO2 98%   BMI 24.89 kg/m  Off O2 Sitting in chair Afib NL rate Lungs cta ant Abd soft NT     Latest Ref Rng & Units 04/17/2022    2:57 AM 04/16/2022    2:39 AM 04/15/2022    6:56 PM  CBC  WBC 4.0 - 10.5 K/uL 10.0  9.3  10.9   Hemoglobin 13.0 - 17.0 g/dL 8.4  7.6  8.0   Hematocrit 39.0 - 52.0 % 27.6  25.6  27.3   Platelets 150 - 400 K/uL 155  198  186        Assessment and Plan  Iron deficiency anemia suspected - recurrent need for transfusion - source probably multifactorial and not all GI   Polypoid lesion on rectum - CT  Anti-coagulated - INR now 1.2  He is much better overall - hopefully would be ready to do colonoscopy/EGD Friday I am going to give some laxatives today and switch to clears for AM and we will reassess. Remains at high risk of sedation-related complications and overall high-risk endoscopic procedure candidate   Gatha Mayer, MD, [facility]astroenter[facility] See Shea Evans on call - gastroenterology for best contact person 04/17/2022 1:40 PM

## 2022-04-17 NOTE — Progress Notes (Signed)
Best of 3 attempts each:  NIF:-40 FVC: 0.7L

## 2022-04-17 NOTE — Progress Notes (Signed)
Pt refused to do NIF/VC at this time.

## 2022-04-17 NOTE — Hospital Course (Signed)
Mr. 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  He was admitted on 04/11/22 with melanotic stools and gross hematuria.  He had been undergoing outpatient GI evaluation for EGD and colonoscopy. After admission he became grossly weaker with worsened respiratory status as well.  He was found to have hypercarbic respiratory failure and transferred to the ICU.  Pulmonology and neurology were also consulted.  He was placed on BiPAP with improvement in his mentation and respiratory status.  He was also noted to have volume overload and was started on diuresis, again with good response. He was not felt to warrant IVIG. He underwent brain imaging and was found to have [place], dural based mass in the right frontal lobe consistent with typical meningioma, and multiple small microhemorrhages consistent with cerebral amyloid angiopathy considered mild in severity. He was evaluated by neurosurgery due to these findings and recommended to have no further intervention or work-up for the meningioma/dural based mass.  SAH was also considered stable.

## 2022-04-17 NOTE — Progress Notes (Signed)
Physical Therapy Treatment Patient Details Name: Daniel Sharp MRN: [id number] DOB: 09-05-1932 Today's Date: 04/17/2022   History of Present Illness Patient is a 88 y.o. male presenting to [facility] ED on 10/5 with hematuria, subacute upper GI bleed and sepsis due to hemorrhagic cystitis. Further work-up pending. PMHx significant for L renal cell carcinoma s/p L nephrectomy 01/22/22 (did not undergo chemo or radiation), Afib, OA, HTN, MG, back surgery, cholecystectomy, appendectomy, chronic iron deficiency, and recent fall with dislocated Rt finger, fractured Rt foot, Rt 5th rib fracture. Rapid response on 10/9 for decreased responses.Head CT showed "Vague right frontal sulcal hyperdensity may represent a trace  developing subarachnoid hemorrhage"-    PT Comments    Patient  more alert and  more appropriate with  answers. Patient son present.  Patient tolerated mobilizing to  sitting then stood x 2 and stepped to recliner with +2  and use of Rw.  Patient with noted less listing when sitting  and standing.  SPO2 on RA 92 %, dropped to 87 %, placed on 2 L for mobility, SPO2 ~90%. After settled in recliner, SPO2 95% on RA. Continue  Mobility.     Recommendations for follow up therapy are one component of a multi-disciplinary discharge planning process, led by the attending physician.  Recommendations may be updated based on patient status, additional functional criteria and insurance authorization.  Follow Up Recommendations  Skilled nursing-short term rehab (<3 hours/day) Can patient physically be transported by private vehicle: No   Assistance Recommended at Discharge Frequent or constant Supervision/Assistance  Patient can return home with the following A lot of help with walking and/or transfers;A little help with bathing/dressing/bathroom;Help with stairs or ramp for entrance;Assistance with cooking/housework;Assist for transportation   Equipment Recommendations  None recommended by PT     Recommendations for Other Services       Precautions / Restrictions Precautions Precautions: Fall Precaution Comments: montior O2 Restrictions Weight Bearing Restrictions: No     Mobility  Bed Mobility Overal bed mobility: Needs Assistance Bed Mobility: Supine to Sit, Sit to Supine     Supine to sit: Mod assist, +2 for safety/equipment, +2 for physical assistance     General bed mobility comments: with increased time and physical assist with cues to advance BLE to EOB and transition trunk into upright posture EOB    Transfers Overall transfer level: Needs assistance Equipment used: Rolling walker (2 wheels) Transfers: Sit to/from Stand, Bed to chair/wheelchair/BSC Sit to Stand: Mod assist, +2 safety/equipment, +2 physical assistance   Step pivot transfers: Mod assist, +2 safety/equipment, +2 physical assistance       General transfer comment: pATIENT ABLE TO POWER UP from bed x 2 at RW. Able to step to recliner, no listing, + 2 mod assist.    Ambulation/Gait                   Stairs             Wheelchair Mobility    Modified Rankin (Stroke Patients Only)       Balance Overall balance assessment: Needs assistance Sitting-balance support: Bilateral upper extremity supported, Feet supported Sitting balance-Leahy Scale: Fair Sitting balance - Comments: minor leaning to the L   Standing balance support: Bilateral upper extremity supported, During functional activity, Reliant on assistive device for balance Standing balance-Leahy Scale: Poor Standing balance comment: reliant on external support of RW and therapist  Cognition Arousal/Alertness: Awake/alert Behavior During Therapy: Flat affect Overall Cognitive Status: Impaired/Different from baseline Area of Impairment: Orientation                 Orientation Level: Situation, Time   Memory: Decreased short-term memory Following Commands: Follows  one step commands with increased time   Awareness: Emergent Problem Solving: Slow processing, Decreased initiation General Comments: noted to have increased time to respond to questions. was appropriate during session. son was present,.        Exercises      General Comments        Pertinent Vitals/Pain Pain Assessment Pain Assessment: No/denies pain    Home Living                          Prior Function            PT Goals (current goals can now be found in the care plan section) Progress towards PT goals: Progressing toward goals    Frequency    Min 2X/week      PT Plan Current plan remains appropriate    Co-evaluation PT/OT/SLP Co-Evaluation/Treatment: Yes Reason for Co-Treatment: Necessary to address cognition/behavior during functional activity;To address functional/ADL transfers;For patient/therapist safety PT goals addressed during session: Mobility/safety with mobility OT goals addressed during session: ADL's and self-care      AM-PAC PT "6 Clicks" Mobility   Outcome Measure  Help needed turning from your back to your side while in a flat bed without using bedrails?: A Lot Help needed moving from lying on your back to sitting on the side of a flat bed without using bedrails?: Total Help needed moving to and from a bed to a chair (including a wheelchair)?: A Lot Help needed standing up from a chair using your arms (e.g., wheelchair or bedside chair)?: A Lot Help needed to walk in hospital room?: Total Help needed climbing 3-5 steps with a railing? : Total 6 Click Score: 9    End of Session Equipment Utilized During Treatment: Gait belt Activity Tolerance: Patient limited by fatigue Patient left: in bed;with call bell/phone within reach;with bed alarm set;with family/visitor present Nurse Communication: Mobility status PT Visit Diagnosis: Unsteadiness on feet (R26.81);Muscle weakness (generalized) (M62.81);Difficulty in walking, not  elsewhere classified (R26.2)     Time: 7902-4097 PT Time Calculation (min) (ACUTE ONLY): 26 min  Charges:  $Therapeutic Activity: 8-22 mins                     Tresa Endo PT Acute Rehabilitation Services Office [phone] Weekend [phone]    Claretha Cooper 04/17/2022, 1:15 PM

## 2022-04-17 NOTE — Progress Notes (Signed)
Calcium repeat only marginally up since morning from 11.8 to 12. Corrected calcium is 13 for alb of 2.7. This is of moderate severity  Plan  - dc lasix  - give 500cc fluids bolus - check calcium again 21.30 and in am 0. If trending higher then needs calcitonin (-- Calcitonin should be administered intramuscularly or subcutaneously; nasal administration of calcitonin is not efficacious for treatment of hypercalcemia [10]. The initial dose is 4 units/kg. )     SIGNATURE    Dr. Brand Males, M.D., F.C.C.P,  Pulmonary and Critical Care Medicine Staff Physician, [place] Director - Interstitial Lung Disease  Program  Medical Director - [facility] [place] at [place], [place], [address]   Pager: [phone], If no answer  -> Check AMION or Try [phone] Telephone (clinical office): [phone] Telephone (research): [phone]  6:29 PM 04/17/2022    Recent Labs  Lab 04/13/22 0413 04/14/22 0445 04/15/22 0351 04/15/22 1708 04/15/22 1856 04/16/22 0239 04/17/22 0257 04/17/22 1203  NA 137 137   < > 132* 132* 136 137 137  K 4.2 5.1   < > 5.4* 5.3* 4.9 4.0 3.7  CL 104 102   < > 100 100 99 97* 95*  CO2 30 30   < > 32 29 33* 32 34*  GLUCOSE 101* 98   < > 126* 143* 108* 83 124*  BUN 42* 37*   < > 37* 34* 39* 41* 45*  CREATININE 1.47* 1.17   < > 1.29* 1.17 1.20 1.23 1.40*  CALCIUM 10.9* 11.5*   < > 11.3* 11.3* 11.8* 11.8* 12.0*  MG 2.3 2.2  --  2.3 2.0 2.2 2.1  --   PHOS 3.3 2.7  --  3.5  --  3.3 2.8  --    < > = values in this interval not displayed.

## 2022-04-17 NOTE — Progress Notes (Addendum)
   Pharmacy noted that ionizec calicum is actuall high and not low  Plan Dc calcium gluconate order   Stop the infusion - d/w RN -> per RN < 10% had been infused and "had just started" at time of stopping  Shared the error with son at bedside 11.40am approx   We will continue to monitor tele (he did say pre-admit there were aware of hypercalcemiaa and stopped calcium supplementation)  Recheck BMET in few hours    SIGNATURE    Dr. Brand Males, M.D., F.C.C.P,  Pulmonary and Critical Care Medicine Staff Physician, [place] Director - Interstitial Lung Disease  Program  Medical Director - [facility] [facility] [place] at [place], [place], [address]   Pager: [phone], If no answer  -[place] or Try [phone] Telephone (clinical office): [phone] Telephone (research): [phone]  11:24 AM 04/17/2022

## 2022-04-17 NOTE — Progress Notes (Signed)
Neurology Progress Note   S://   Following up on this 88 year old gentleman with history of kidney cancer status post left nephrectomy in July 2023,  Ocular Myasthenia Gravis since 1997 maintained on prednisone '10mg'$  daily, mestinon '60mg'$  TID and s/p thymectomy. Baseline symptoms of diplopia, in the process of attaining prisms outpatient.  His PMH significant for afib, mitral valve annuloplasty maintained on warfarin. He is currently admitted to [facility] hospital with gross hematuria/melena and anemia, was reversed with vitamin K and is not currently on anticoagulation. He has also been reporting lethargy. He was more confused and difficult to arouse over the last day and found to be in hypoxic and hypercarbic respiratory failure. Being treated for community acquired pneumonia, initially found to have pulmonary congestion on CXR and pCO2 of 70, now improved as of yesterday to pCO2 of 57, 54 today. Also found to have incidental SAH on head CT, further substantiated on MRI,for which neurosurgery following.   Today, he remains on BiPAP overnight but holds full conversation with author when removed. He offers no complaints and denies new neurological symptoms such as different visual changes, SOB, numbness, tingling, weakness.   O:// Current vital signs: BP 128/63 (BP Location: Right Arm)   Pulse 82   Temp 97.7 F (36.5 C) (Oral)   Resp 14   Ht 6' 0.5" (1.842 m)   Wt 84.4 kg   SpO2 97%   BMI 24.89 kg/m  Vital signs in last 24 hours: Temp:  [97.4 F (36.3 C)-98.3 F (36.8 C)] 97.7 F (36.5 C) (10/11 0800) Pulse Rate:  [64-104] 82 (10/11 0900) Resp:  [7-28] 14 (10/11 0900) BP: (113-146)/(48-79) 128/63 (10/11 0900) SpO2:  [97 %-100 %] 97 % (10/11 0900) FiO2 (%):  [30 %] 30 % (10/11 0551) Weight:  [84.4 kg] 84.4 kg (10/11 0401) GENERAL: sleeping, easy to arouse to verbal stimulus, in NAD HEENT: - Normocephalic and atraumatic, dry mm, no LN++, no Thyromegally  LUNGS -  respiratory effort is normal,  RR 28 for author , Single Count Breath Test 13 CV - S1S2 RRR, no m/r/g, equal pulses bilaterally. ABDOMEN - Soft, nontender, nondistended with normoactive BS Ext: warm, well perfused, intact peripheral pulses, no edema  NEURO:  Mental Status: AA&Ox3  Language: speech hypophonic but clear.  Naming, repetition, fluency, and comprehension intact. Cranial Nerves: PERRL 3--> 58m. EOMI, visual fields full, + subjective diplopia OU intermittently (states is baseline) that improves coverage either eye,  no facial asymmetry, facial sensation intact, hearing intact, tongue/uvula/soft palate midline, 4/5 sternocleidomastoid strength with neck flexion and extension and 5/5 trapezius muscle strength BL, head turn 4-5/5. No evidence of tongue atrophy or fibrillations Motor: 5/5 strength throughout BL upper and lower extremities in all muscle groups. No fatigable weakness in upper or lower extremities after rigorous , repetitive movement; intermittent, mild myoclonus right > left upper extremities , postural, multifocal but mainly distal   Tone: is normal and bulk is normal Sensation- Intact to light touch bilaterally Coordination/Complex Motor:   Finger to Nose intact BL,  Heel to shin unable to do,  Rapid alternating movement are normal,  Gait: Deferred for patient safety  Medications  Current Facility-Administered Medications:    acetaminophen (TYLENOL) tablet 650 mg, 650 mg, Oral, Q6H PRN, 650 mg at 04/15/22 1429 **OR** acetaminophen (TYLENOL) suppository 650 mg, 650 mg, Rectal, Q6H PRN, Howerter, Justin B, DO   ceFEPIme (MAXIPIME) 2 g in sodium chloride 0.9 % 100 mL IVPB, 2 g, Intravenous, Q12H, Shade, CHaze Justin RPH,  Stopped at 04/16/22 2149   Chlorhexidine Gluconate Cloth 2 % PADS 6 each, 6 each, Topical, Daily, Raiford Noble [place], [place], 6 each at 04/16/22 1623   finasteride (PROSCAR) tablet 5 mg, 5 mg, Oral, Daily, Marton Redwood III, MD, 5 mg at 04/16/22 1958   furosemide (LASIX) injection 40 mg, 40  mg, Intravenous, Q8H, Ramaswamy, Belva Crome, MD, 40 mg at 04/17/22 0527   Oral care mouth rinse, 15 mL, Mouth Rinse, 4 times per day, Alfredia Ferguson, Omair Latif, DO, 15 mL at 04/17/22 0830   Oral care mouth rinse, 15 mL, Mouth Rinse, PRN, Alfredia Ferguson, Omair Latif, DO   pantoprazole (PROTONIX) injection 40 mg, 40 mg, Intravenous, Q12H, Howerter, Justin B, DO, 40 mg at 04/16/22 2122   predniSONE (DELTASONE) tablet 10 mg, 10 mg, Oral, Daily, Howerter, Justin B, DO, 10 mg at 04/16/22 0910   pyridostigmine (MESTINON) injection 2 mg, 2 mg, Intravenous, QID, 2 mg at 04/16/22 2123 **OR** pyridostigmine (MESTINON) tablet 60 mg, 60 mg, Oral, QID, Donnetta Simpers, MD, 60 mg at 04/16/22 1455   tamsulosin (FLOMAX) capsule 0.4 mg, 0.4 mg, Oral, Daily, Howerter, Justin B, DO, 0.4 mg at 04/16/22 0910 Labs CBC    Component Value Date/Time   WBC 10.0 04/17/2022 0257   RBC 2.90 (L) 04/17/2022 0257   HGB 8.4 (L) 04/17/2022 0257   HGB 8.1 (L) 04/03/2022 1040   HCT 27.6 (L) 04/17/2022 0257   PLT 155 04/17/2022 0257   PLT 234 04/03/2022 1040   MCV 95.2 04/17/2022 0257   MCH 29.0 04/17/2022 0257   MCHC 30.4 04/17/2022 0257   RDW 14.6 04/17/2022 0257   LYMPHSABS 1.2 04/17/2022 0257   MONOABS 0.7 04/17/2022 0257   EOSABS 0.1 04/17/2022 0257   BASOSABS 0.0 04/17/2022 0257   CMP     Component Value Date/Time   NA 137 04/17/2022 0257   K 4.0 04/17/2022 0257   CL 97 (L) 04/17/2022 0257   CO2 32 04/17/2022 0257   GLUCOSE 83 04/17/2022 0257   BUN 41 (H) 04/17/2022 0257   CREATININE 1.23 04/17/2022 0257   CALCIUM 11.8 (H) 04/17/2022 0257   PROT 6.1 (L) 04/17/2022 0257   ALBUMIN 2.5 (L) 04/17/2022 0257   AST 16 04/17/2022 0257   ALT 20 04/17/2022 0257   ALKPHOS 66 04/17/2022 0257   BILITOT 0.8 04/17/2022 0257   GFRNONAA 57 (L) 04/17/2022 0257   Imaging I have reviewed images in epic and the results pertinent to this consultation are:  CT-scan of the brain Trace acute subarachnoid hemorrhage overlying the  posterior right frontal lobe, unchanged from the brain MRI performed earlier today.  Redemonstrated 13 x 4 mm dural-based mass overlying the mid right frontal lobe, likely reflecting a meningioma. No significant mass effect upon the underlying brain parenchyma.  Background cerebral atrophy and chronic small vessel disease, stable.  MRI examination of the brain: 1. Small-volume acute subarachnoid hemorrhage overlying the posterior right frontal lobe, unchanged from the prior head CT of 04/15/2022. 2. 13 x 4 mm dural-based mass overlying the right frontal lobe, likely reflecting a meningioma. No significant mass effect upon the underlying brain parenchyma. 3. Chronic lacunar infarcts in the bilateral cerebral hemispheric white matter. 4. Background mild cerebral white matter and pontine chronic small vessel ischemic disease. 5. 3-4 mm lesion within the anterior right frontal lobe, which may reflect a cavernoma or chronic microhemorrhage. 6. Several chronic microhemorrhages scattered elsewhere within the supratentorial and infratentorial brain. 7. Mild-to-moderate generalized cerebral atrophy. 8. Mild cerebellar atrophy.  Assessment:  Daniel Sharp is an 88 y.o. male with PMH significant for afib, mechanical mitral valve repair on warfarin, HTN, and occular myasthenia maintained on prednisone '10mg'$  daily and mestinon '60mg'$  TID and s/p thymectomy. He is currently admitted to [facility] hospital with gross hematuria/melena, anemia, and lethargy. He was more confused and difficult to arouse over the last few days and found to be in hypoxic and hypercarbic respiratory failure. Neurology is following for concern possible MG flare management.  Overall, he is very strong with no fatiguable weakness in limbs. Although Single Breath Count Test 13 seconds, I do think mental status precludes accuracy, as sentence formation and execution are in tact without effort. The only finding on examination to continue to  monitor currently in terms of his MG is 4/5 weakness neck flexion/extension and perhaps mild 4/5 weakness of had rotation. His level of arousal and mentation are stable and he is in no visible respiratory distress.  Continue to feel there is some concern for mild MG exacerbation given he does have mild weakness of neck flexion and extension as well as head rotation and weaker cough. Would expect lethargy to cause more of a generalized weakness. However, does have multifocal pneumonia with consolidations on CXR.  Recommendations:  #Ocular Myasthenia Gravis -hold off on IVIG or PLEX for now with close clinical monitoring, with low threshold to treat should he exhibit sustained hypercarbia on ABG, respiratory distress, or descending muscle weakness -Continue Q4H NIFs and VC. -will follow along for serial examinations  - Recommend elective intubation for respiratory compromise if VC falls below 15 to 20 mL/kg and or NIFs falls below -20cm/H2O. If poor effort and not sure, can always get ABG to assess for CO2 retention. Elective intubation for airway cmopromise if he has difficulty clearing his secretions. Oxygen saturation should not be used to make decision regarding intubation. - Mestinon PO increased to '60mg'$  QID from home dose of '60mg'$  TID. If unable to swallow, can switch from PO to IV.('30mg'$  PO is equivalent to '1mg'$  IV). - Medications that may worsen or trigger MG exacerbation: Class IA antiarrhythmics, magnesium, flouroquinolones, macrolides, aminoglycosides, penicillamine, curare, interferon alpha, botox, quinine. Use with caution: calcium channel blocker, beta blockers and statins. -if EGD/Colonoscopy needed from GI standpoint, no contraindication for this from neurological standpoint as long as respiratory status monitored closely, ABGs for monitoring hypercarbia obtained as needed, and low threshold for intubation maintained   #Chronic ischemic infarctions Continue anticoagulation for mechanical  valve -in outpatient setting, can check LDL, hga1c with goal LDL < 70 (would not initiate statin during concern for MG flare) as well as Hga1c    #Scattered microhemorrhages on SWI sequence MRI  #stable Subarachnoid Hemorrhage #Atrial fibrillation Scant but cannot rule out Cerebral Amyloid Angiopathy, though this may also be related to his chronic anticoagulation.  Given he has been tolerating anticoagulation without significant hemorrhage, long-term I think risk of stroke from atrial fibrillation is greater than risk from possible CAA but will review images with radiology further.  Okay to continue to hold anticoagulation at this time while pending GI procedure and given his anemia  #Pneumonia Treatment on cefepime currently with subtle myoclonus.  -watch closely for cefepime toxicity, change to a less deliriogenic medication if possible -daily BMP to ensure acceptable kidney function   #GI bleed -No contraindication for intervention from a neurological perspective, please avoid anesthetic agents that would worsen myasthenia  This case was discussed and images reviewed with Dr. Curly Shores.   -- Lennie Hummer, PA-C  Neurology   Attending Neurologist's note:  I personally saw this patient, gathering history, performing a full neurologic examination, reviewing relevant labs, personally reviewing relevant imaging including MRI brain, and formulated the assessment and plan, adding the note above for completeness and clarity to accurately reflect my thoughts  On my later evaluation the patient is slightly delirious, stating for example that it is Wednesday but "acting like a Thursday" and then giving me some tangential explanation about having seen a schedule that stated Wednesday should be a quiet day.  He does have some ptosis of the right eye and reports some fatigable diplopia that worsens on upgaze within 10 seconds.  Single breath test remains stable at approximately 14.  He does seem to get  short of breath at the end of sentences for me and he has mild bilateral deltoid weakness 4+/5, but hip flexion bilaterally is 3/5.  Discussed with Dr. Sabino Gasser via phone and Dr Chase Caller.  Concern for worsening myasthenic symptoms versus cefepime encephalopathy contributing to his symptoms.  IVIG would not be without risk at this time given his kidney function and need for diuresis and Plex would require transfer to [facility] where there is no bed availability.  Therefore we will continue to monitor closely for now.  Additionally unclear etiology of his hypercarbia as this is a late finding and myasthenia and his NIFs are stable with minimal other evidence of respiratory distress from a neuromuscular standpoint.  Lesleigh Noe MD-PhD [facility] [phone] Available 7 AM to 7 PM, outside these hours please contact Neurologist on call listed on AMION

## 2022-04-17 NOTE — Progress Notes (Signed)
NIF (best of 3 attempts)=+-40  FVC (best of 3 attempts)= 8.5L

## 2022-04-17 NOTE — Progress Notes (Signed)
PT remains asleep and on BiPAP at this time- no NIF or FVC for 0800 round.

## 2022-04-17 NOTE — Consult Note (Addendum)
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  Also chronic iron deficiency anemia.  He is on chronic steroids for his ocular myasthenia and?  Mestinon  He has few month history of dark-colored stool for which outpatient EGD and colonoscopy was scheduled for April 18, 2022.  Prior to admission required 3 units of PRBC but he was admitted 04/11/2022 with severe gross hematuria suspected hemorrhagic cystitis /admission creatinine of 1.4 mg percent.  Warfarin was held.  Empiric Rocephin was started.  CT abdomen did suggest a rectal mass.  Coumadin was held.  He also had slightly high for thalassemia at 11.8 treated with hydration.  Albumin admission was low at 2.5  Course in the hospital - 04/13/2022: GI consult.  Plan for endoscopy and colonoscopy once INR normalizes [at admission 3].  Also had nonsustained V. Tach -04/14/2022: Found to be generally weak but otherwise alert.  INR 2.9.  Complains of general fatigue.  Son reported progressive weakness thought to be due to physical deconditioning from poor p.o. intake  - 04/15/2022: Progressive weakness and unresponsiveness.  Son concerned significant negative change.  Minimally responsive.  Blood gas with severe acute on chronic respiratory acidosis.  Son denies that he is a smoker having underlying lung disease.  Patient transferred to the ICU and CCM consulted.  Neuro consultation placed.  Chest x-ray suggestive of pulmonary congestion [significantly worse compared to  August 2023], CT head with trace SAH  Past Medical History:    has a past medical history of A-fib ([place]), Arthritis, Dyspnea, H/O mitral valve repair (07/2003), Hypertension, Ocular myasthenia gravis ([place]), and Pneumonia.   has a past surgical history that includes Appendectomy; Nose surgery; Mitral valve repair; Cholecystectomy; Back surgery; Finger surgery (Right); and Robotic assited partial nephrectomy (Left, 01/22/2022).      Significant Hospital Events:  04/11/2022 - admit  - 04/13/2022: GI consult.  Plan for endoscopy and colonoscopy once INR normalizes [at admission 3].  Also had nonsustained V. Tach  -04/14/2022: Found to be generally weak but otherwise alert.  INR 2.9.  Complains of general fatigue.  Son reported progressive weakness thought to be due to physical deconditioning from poor p.o. intake   - 04/15/2022: Progressive weakness and unresponsiveness.  Son concerned significant negative change.  Minimally responsive.  Blood gas with severe acute on chronic respiratory acidosis.  Son denies that he is a smoker having underlying lung disease.  Patient transferred to the ICU and CCM consulted.  Neuro consultation placed.  Chest x-ray suggestive of pulmonary congestion [significantly worse compared to August 2023], CT head with trace SAH  - Neuro consult - monitor with Nif and FVC and Abg  - BNO 313  - LTD code   - 04/16/22 - BiPAP overnight and NiF not done. Hypercapnia improved on ABG but still PCO2 > 50. Afebirl ewiht normal WBC. Worsening CXR R > L basal air space disease. Creat better to 1.2. BNP up at 300s.  PCT 0.27. ECHO pending. Got lasix x 1 yearsstraay . -2.5L negative balance. Much more awake today but confused. No hematuria. No GI bleed  -  Uro consult for gross hematuria - Rx continue tamsulosin and finaseerted. Hold off CT hemarutia protocol. 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 CAA re;ated multiple smal  hemorrhages and  stable SAH  Interim History / Subjective:   04/17/2022 - NiF -40.Marland Kitchen NSGY consult - supportive care Uro consult - opd follolwup. Afebrile. . On abc. No bleeding. On 2L [place] . Being diuresed  -6L [place]. Off 2L [place] and pulse ox 96%. RN concnerned about frequent NSVTACh in setting of c hronic A Fib. ECHO - ok. Last EKG 2d ago with normal QTc. Mag normal. Son at bedside.  Feels dad is doing ok but less goofy/chirpy today   Objective   Blood pressure 128/63, pulse 82, temperature 97.7 F (36.5 C), temperature source Oral, resp. rate 14, height 6' 0.5" (1.842 m), weight 84.4 kg, SpO2 97 %.    FiO2 (%):  [30 %] 30 %   Intake/Output Summary (Last 24 hours) at 04/17/2022 1000 Last data filed at 04/17/2022 6283 Gross per 24 hour  Intake 427.53 ml  Output 4550 ml  Net -4122.47 ml   Filed Weights   04/13/22 0500 04/14/22 0526 04/17/22 0401  Weight: 90.1 kg 85.9 kg 84.4 kg   General Appearance:  Looks chronic unwell Head:  Normocephalic, without obvious abnormality, atraumatic Eyes:  PERRL - yes, conjunctiva/corneas - muddy . NO PTOSIS     Ears:  Normal external ear canals, both ears Nose:  G tube - no Throat:  ETT TUBE - no , OG tube -no Neck:  Supple,  No enlargement/tenderness/nodules Lungs: Clear to auscultation bilaterally,  Heart:  S1 and S2 normal, no murmur, CVP - x.  Pressors - no Abdomen:  Soft, no masses, no organomegaly Genitalia / Rectal:  Not done Extremities:  Extremities- intact Skin:  ntact in exposed areas . Sacral area - not exmined Neurologic:  Sedation - none -> RASS - +1 . Moves all 4s - yes. CAM-ICU - x . Orientation - pleasant and partially oreinted. Very slow recall       Assessment & Plan:   Acute on chronic respiratory acidosis with hypoxemic respiratory failure with pulmonary congestion/inffltates-04/15/2022 - CT/ECho suggests acute on chronic diast chf  04/17/2022 -> improved hypercarbia  as of yesterday. but still high CO2. CT chest Acute on chronic diast chf.  NIV and VC good  P:   O2 for pusle ox > 92% (able to wean off 04/17/22) BiPAP to continue day prn + NIGHT MANDATORTY Low threshold to intubate given myasthenia gravis  - per neuro: elective intubation if VC falls below 15 to 20 mL/kg and or NIFs falls below -20cm/H2O   ?  History of pain requiring opioids History of ocular myasthenia gravis -on prednisone and Mestinon MIld MG flare 04/15/22 Acute on chronic encephalopathy -04/15/2022 CT head with small trace subarachnoid hemorrhage 04/15/2022 [seen by neurosurgery] MRI 04/16/12 with microhge c/w CAA and also Right frontal hemangioma - likely present on admit  - 04/17/2022 bilateral ptosis resolved x 24h and metntal stuats improved but stil plesanatly confused  P:   Expectant followup for CNS lesions For MG  - Q4h Nif monitoring - holding off IVIG/PLEX for now  Avoid the following meds in the setting of myasthenia   - As a rule, the listed drugs should be avoided whenever possible, and patients with MG should be followed closely when any new drug is introduced.  Drugs that may exacerbate MG   Antibiotics  Aminoglycosides: e.g., streptomycin, tobramycin, kanamycin   Quinolones: e.g., ciprofloxacin, levofloxacin, ofloxacin, gatifloxacin   Macrolides: e.g., erythromycin, azithromycin, telithromycin   Nondepolarizing muscle relaxants for surgery   d-Tubocurarine (curare), pancuronium, vecuronium, atracurium   Beta-blocking agents   Propranolol, atenolol, metoprolol   Local anesthetics and related agents   Procaine, Xylocaine in large amounts   Procainamide (for arrhythmias)   Botulinum toxin   Botox exacerbates weakness.   Quinine derivatives   Quinine, quinidine, chloroquine, mefloquine (Lariam)   Magnesium   Decreases ACh release   Penicillamine   May cause MG   Drugs with important interactions in MG   Cyclosporine   Broad range of drug interactions, which may raise or lower cyclosporine levels   Azathioprine   Avoid  allopurinol; c    History of hypertension on amlodipine   Plan  - Holding amlodipine at the moment    Chronic cor pulmonale with pulmonary systolic pressure in the 76P with reduction in right ventricular function -baseline August 2023 -on Lasix  10/9 and 10/10 -suspect acute on chronic diastolic heart failure On 110/1123 - -6L nega and BUN and creat holding  P: Lasix continue (since 10/9 and increaed 10/10) '40mg'$  tid  Known atrial fibrillation on Coumadin -Coumadin on hold since admission given hematuria and GI bleed history   10/10  -  Anticoagulation on hold secondary to anticipated endoscopy and colonoscopy . INR now corrected 04/17/22 - Freq NSVTACh reported   P: Telemetry Hold anticoagulation [place] 12 lead - monitor QTc - avoid QTc prolongation If persists, triad to conssider Cards consult    Culture-negative sepsis suspected at admission secondary to hemorrhagic cystitis - Present on Admit Concern for HAP 04/15/22 (low grade feve 10/7 with rise in wbc to 11K)  04/17/2022 -afebrile  P:   Ceftriaxone empiric 04/11/2022 >>Cefepime 04/16/22>> (low threadhold to stop)     BPH on Flomax at home Status post nephrectomy for renal cell cancer July 2023 CKD baseline creatinine 1.2-1.6 Admitted with severe gross hematuria  04/17/2022 -creatinine range bound.  No hematuria. Uro consult for opd followup  P:  Monitor OPD uro followup    Mild (correcteD) hypercalcemia at admission -due to dehydration  On 10/9 - Ionized calcim is actually low - c/w mild hypocalcemia   P: Monitor closely and correct as needed Mnitor all ltes     History of GI bleed prior to admission -outpatient endoscopy and colonoscopy scheduled for 04/18/2022 -on outpatient PPI Discovery of potential rectal polyp mass at this admission   04/17/2022 -awaiting endoscopy and colonoscopy pending correction of INR/.. No gross bleeding. Resp status improved 10/11 and likely allows for inpatien  endoscopy  P:   Stress ulcer prophylaxis GI consult following  - if current status holds ok for UGI/colonoscopy - prob best window 04/17/22 or 04/18/22 in ICU or endoscopy suite but recommend post procedure ICU monitoring x 24h - will get ABG 04/18/22 to help risk assess for sedation   Anemia of GI bleed and now also hematuria at admission  04/16/2022: No active GI bleeding or hematuria  P:  - INR on hold - PRBC for hgb </= 6.9gm%    - exceptions are   -  if ACS susepcted/confirmed then transfuse for hgb </= 8.0gm%,  or    -  active bleeding with hemodynamic instability, then transfuse regardless of hemoglobin value   At at all times try to transfuse 1 unit prbc as possible with exception of active hemorrhage     At risk for  hypo and hyperglycemia At risk for relative adrenal insufficiency  P:   SSI Might need stress dose steroids but myasthenia gravis weighs   Chronic prednisone therapy -at admission Mild protein calorie malnutrition -at admission Physical deconditioning - worse during stay  Plan  -PT OT consult as needed - OOB chair - monitor    Best practice (daily eval):  Diet: per triad Pain/Anxiety/Delirium protocol (if indicated): x  VAP protocol (if indicated): Oral care DVT prophylaxis: Chronic anticoagulation warfarin currently on hold GI prophylaxis: PPI Glucose control: SSI Mobility: Bedrest Code Status: Partial code basd on IPAL 04/15/22 Family Communication: Son updated at the bedside 04/15/22 and 04/16/22 and 04/17/22 Disposition: ICU  Others : PICC ordered  Triad PRimary - ccm consult. If intubated ccm primary     ATTESTATION & SIGNATURE    Dr. Brand Males, M.D., [facility].C.P Pulmonary and Critical Care Medicine Medical Director - [facility] ICU Staff Physician, [facility] Pager: [phone], If no answer or between  15:00h - 7:00h: call [phone]  04/17/2022 10:50  AM    LABS    PULMONARY Recent Labs  Lab 04/15/22 1706 04/15/22 2128 04/16/22 0418  PHART 7.28* 7.34* 7.39  PCO2ART 70* 63* 57*  PO2ART 102 108 112*  HCO3 33.4* 34.3* 34.3*  O2SAT 99 99.2 99.1    CBC Recent Labs  Lab 04/15/22 1856 04/16/22 0239 04/17/22 0257  HGB 8.0* 7.6* 8.4*  HCT 27.3* 25.6* 27.6*  WBC 10.9* 9.3 10.0  PLT 186 198 155    COAGULATION Recent Labs  Lab 04/13/22 1116 04/14/22 0445 04/15/22 0351 04/16/22 0239 04/17/22 0257  INR 3.1* 2.9* 2.5* 1.5* 1.2    CARDIAC  No results for input(s): "TROPONINI" in the last 168 hours. No results for input(s): "PROBNP" in the last 168 hours.  CHEMISTRY Recent Labs  Lab 04/13/22 0413 04/14/22 0445 04/15/22 0351 04/15/22 1708 04/15/22 1856 04/16/22 0239 04/17/22 0257  NA 137 137 137 132* 132* 136 137  K 4.2 5.1 5.4* 5.4* 5.3* 4.9 4.0  CL 104 102 102 100 100 99 97*  CO2 '30 30 31 '$ 32 29 33* 32  GLUCOSE 101* 98 107* 126* 143* 108* 83  BUN 42* 37* 37* 37* 34* 39* 41*  CREATININE 1.47* 1.17 1.26* 1.29* 1.17 1.20 1.23  CALCIUM 10.9* 11.5* 12.0* 11.3* 11.3* 11.8* 11.8*  MG 2.3 2.2  --  2.3 2.0 2.2 2.1  PHOS 3.3 2.7  --  3.5  --  3.3 2.8   Estimated Creatinine Clearance: 47.2 mL/min (by C-G formula based on SCr of 1.23 mg/dL).   LIVER Recent Labs  Lab 04/13/22 1116 04/14/22 0445 04/15/22 0351 04/15/22 1708 04/15/22 1856 04/16/22 0239 04/17/22 0257  AST  --  17  --  '19 18 15 16  '$ ALT  --  21  --  '23 23 22 20  '$ ALKPHOS  --  68  --  65 67 68 66  BILITOT  --  0.8  --  0.8 0.8 0.8 0.8  PROT  --  6.4*  --  6.1* 5.8* 5.9* 6.1*  ALBUMIN  --  2.6*  --  2.6* 2.4* 2.3* 2.5*  INR 3.1* 2.9* 2.5*  --   --  1.5* 1.2     INFECTIOUS Recent Labs  Lab 04/11/22 2022 04/15/22 1856 04/16/22 0239 04/17/22 0257  LATICACIDVEN 1.4  --   --   --   PROCALCITON  --  0.18 0.27 0.23  ENDOCRINE CBG (last 3)  Recent Labs    04/15/22 1708 04/16/22 0632 04/16/22 1932  GLUCAP 132* 99 107*          IMAGING x48h  - image(s) personally visualized  -   highlighted in bold DG CHEST PORT 1 VIEW  Result Date: 04/17/2022 CLINICAL DATA:  Shortness of breath EXAM: PORTABLE CHEST 1 VIEW COMPARISON:  Chest CT April 16, 2022 and chest radiograph April 16, 2022. FINDINGS: The heart size and mediastinal contours are enlarged, unchanged. Prior median sternotomy. Aortic atherosclerosis. Mitral valve replacement. Similar veiling bilateral opacities reflecting layering pleural effusions and adjacent atelectasis/consolidation. Bilateral interstitial opacities are also unchanged which may reflect infection or edema. No acute osseous abnormality IMPRESSION: Similar bilateral pleural effusions with adjacent infiltrate or atelectasis. Stable cardiomegaly with bilateral interstitial opacities favor pulmonary edema. Electronically Signed   By: Dahlia Bailiff M.D.   On: 04/17/2022 08:30   ECHOCARDIOGRAM COMPLETE  Result Date: 04/16/2022    ECHOCARDIOGRAM REPORT   Patient Name:   Daniel Sharp Date of Exam: 04/16/2022 Medical Rec #:  [id number]   Height:       72.5 in Accession #:    [id number]  Weight:       189.4 lb Date of Birth:  02-27-1933   BSA:          2.092 m Patient Age:    3 years    BP:           134/63 mmHg Patient Gender: M           HR:           77 bpm. Exam Location:  Inpatient Procedure: 2D Echo Indications:    Cardiomyopathy  History:        Patient has prior history of Echocardiogram examinations, most                 recent 02/14/2022. Cardiomyopathy, MV repair, Arrythmias:Atrial                 Fibrillation, Signs/Symptoms:Edema; Risk Factors:Hypertension.  Sonographer:    Harvie Junior Referring Phys: [place]  1. Left ventricular ejection fraction, by estimation, is 55%. The left ventricle has low normal function. The left ventricle has no regional wall motion abnormalities. There is severe concentric left ventricular hypertrophy. Left ventricular diastolic  parameters are indeterminate.  2. Right ventricular systolic function is normal. The right ventricular size is mildly enlarged. There is normal pulmonary artery systolic pressure.  3. Left atrial size was moderately dilated.  4. Large pleural effusion in the left lateral region.  5. The mitral valve has been repaired/replaced. Mild to moderate mitral valve regurgitation and eccentric, best seen in subcostal views  6. The aortic valve is tricuspid. Aortic valve regurgitation is mild. Comparison(s): Similar MR; has had intermittent atrial fibrillation through this study. FINDINGS  Left Ventricle: Left ventricular ejection fraction, by estimation, is 55%. The left ventricle has low normal function. The left ventricle has no regional wall motion abnormalities. The left ventricular internal cavity size was normal in size. There is severe concentric left ventricular hypertrophy. Left ventricular diastolic parameters are indeterminate. Right Ventricle: The right ventricular size is mildly enlarged. No increase in right ventricular wall thickness. Right ventricular systolic function is normal. There is normal pulmonary artery systolic pressure. The tricuspid regurgitant velocity is 2.74  m/s, and with an assumed right atrial pressure of 3 mmHg, the estimated right ventricular systolic pressure is 16.0 mmHg. Left Atrium: Left atrial size  was moderately dilated. Right Atrium: Right atrial size was normal in size. Pericardium: Trivial pericardial effusion is present. Mitral Valve: The mitral valve has been repaired/replaced. Mild to moderate mitral valve regurgitation. Tricuspid Valve: The tricuspid valve is normal in structure. Tricuspid valve regurgitation is mild . No evidence of tricuspid stenosis. Aortic Valve: The aortic valve is tricuspid. Aortic valve regurgitation is mild. Aortic regurgitation PHT measures 680 msec. Aortic valve mean gradient measures 4.0 mmHg. Aortic valve peak gradient measures 7.5 mmHg. Aortic  valve area, by VTI measures 3.38 cm. Pulmonic Valve: The pulmonic valve was normal in structure. Pulmonic valve regurgitation is not visualized. No evidence of pulmonic stenosis. Aorta: The aortic root and ascending aorta are structurally normal, with no evidence of dilitation. IAS/Shunts: No atrial level shunt detected by color flow Doppler. Additional Comments: There is a large pleural effusion in the left lateral region.  LEFT VENTRICLE PLAX 2D LVIDd:         4.80 cm      Diastology LVIDs:         3.50 cm      LV e' medial:    5.98 cm/s LV PW:         1.40 cm      LV E/e' medial:  21.1 LV IVS:        1.60 cm      LV e' lateral:   11.50 cm/s LVOT diam:     2.30 cm      LV E/e' lateral: 11.0 LV SV:         61 LV SV Index:   29 LVOT Area:     4.15 cm  LV Volumes (MOD) LV vol d, MOD A2C: 146.0 ml LV vol d, MOD A4C: 115.0 ml LV vol s, MOD A2C: 53.5 ml LV vol s, MOD A4C: 47.2 ml LV SV MOD A2C:     92.5 ml LV SV MOD A4C:     115.0 ml LV SV MOD BP:      79.5 ml RIGHT VENTRICLE RV Basal diam:  4.20 cm RV Mid diam:    3.70 cm RV S prime:     23.20 cm/s TAPSE (M-mode): 1.4 cm LEFT ATRIUM              Index        RIGHT ATRIUM           Index LA diam:        4.20 cm  2.01 cm/m   RA Area:     20.60 cm LA Vol (A2C):   111.0 ml 53.05 ml/m  RA Volume:   63.00 ml  30.11 ml/m LA Vol (A4C):   78.3 ml  37.42 ml/m LA Biplane Vol: 95.7 ml  45.74 ml/m  AORTIC VALVE                    PULMONIC VALVE AV Area (Vmax):    3.43 cm     PV Vmax:       1.36 m/s AV Area (Vmean):   3.14 cm     PV Peak grad:  7.4 mmHg AV Area (VTI):     3.38 cm AV Vmax:           137.00 cm/s AV Vmean:          98.600 cm/s AV VTI:            0.182 m AV Peak Grad:      7.5 mmHg AV Mean Grad:  4.0 mmHg LVOT Vmax:         113.00 cm/s LVOT Vmean:        74.600 cm/s LVOT VTI:          0.148 m LVOT/AV VTI ratio: 0.81 AI PHT:            680 msec  AORTA Ao Root diam: 3.70 cm Ao Asc diam:  3.50 cm MITRAL VALVE                TRICUSPID VALVE MV Area (PHT):  4.06 cm     TR Peak grad:   30.0 mmHg MV Decel Time: 187 msec     TR Vmax:        274.00 cm/s MR Peak grad: 99.9 mmHg MR Vmax:      499.67 cm/s   SHUNTS MV E velocity: 126.00 cm/s  Systemic VTI:  0.15 m MV A velocity: 94.00 cm/s   Systemic Diam: 2.30 cm MV E/A ratio:  1.34 Rudean Haskell MD Electronically signed by Rudean Haskell MD Signature Date/Time: 04/16/2022/3:37:21 PM    Final    CT HEAD WO CONTRAST (5MM)  Result Date: 04/16/2022 CLINICAL DATA:  Provided history: Subarachnoid hemorrhage. EXAM: CT HEAD WITHOUT CONTRAST TECHNIQUE: Contiguous axial images were obtained from the base of the skull through the vertex without intravenous contrast. RADIATION DOSE REDUCTION: This exam was performed according to the departmental dose-optimization program which includes automated exposure control, adjustment of the mA and/or kV according to patient size and/or use of iterative reconstruction technique. COMPARISON:  Brain MRI 04/16/2022. Non-contrast head CT 04/15/2022. CT angiogram head/neck 04/15/2022. FINDINGS: Brain: Mild-to-moderate generalized cerebral atrophy. Mild cerebellar atrophy. Trace acute subarachnoid hemorrhage overlying the posterior right frontal lobe, unchanged (series 2, image 27) (series 5, image 41) (series 6, image 22). 13 x 4 mm dural-based mass overlying the mid right frontal lobe, likely reflecting a meningioma. No significant mass effect upon the underlying brain parenchyma. Chronic lacunar infarcts in bilateral cerebral hemispheric white matter, better appreciated on the same-day brain MRI. Background mild chronic small ischemic changes within the cerebral white matter and pons. No demarcated cortical infarct No midline shift or hydrocephalus. Vascular: No hyperdense vessel. Atherosclerotic calcifications. Skull: No fracture or aggressive osseous lesion. Sinuses/Orbits: No mass or acute finding within the imaged orbits. No significant paranasal sinus disease at the imaged  levels. IMPRESSION: Trace acute subarachnoid hemorrhage overlying the posterior right frontal lobe, unchanged from the brain MRI performed earlier today. Redemonstrated 13 x 4 mm dural-based mass overlying the mid right frontal lobe, likely reflecting a meningioma. No significant mass effect upon the underlying brain parenchyma. Background cerebral atrophy and chronic small vessel disease, stable. Electronically Signed   By: Kellie Simmering D.O.   On: 04/16/2022 12:16   CT CHEST WO CONTRAST  Result Date: 04/16/2022 CLINICAL DATA:  Sepsis EXAM: CT CHEST WITHOUT CONTRAST TECHNIQUE: Multidetector CT imaging of the chest was performed following the standard protocol without IV contrast. RADIATION DOSE REDUCTION: This exam was performed according to the departmental dose-optimization program which includes automated exposure control, adjustment of the mA and/or kV according to patient size and/or use of iterative reconstruction technique. COMPARISON:  CT abdomen pelvis, 04/11/2022 FINDINGS: Cardiovascular: Aortic atherosclerosis. Cardiomegaly. Three-vessel coronary artery calcifications. Enlargement of the main pulmonary artery measuring up to 3.8 cm. No pericardial effusion. Mediastinum/Nodes: Prominent mediastinal lymph nodes. Thyroid gland, trachea, and esophagus demonstrate no significant findings. Lungs/Pleura: Moderate bilateral pleural effusions and associated atelectasis or consolidation increased when compared to the lung  bases on examination dated 04/11/2022. Diffuse bilateral bronchial wall thickening. Ground-glass airspace opacity and interseptal lobular thickening. Upper Abdomen: No acute abnormality. Musculoskeletal: No chest wall abnormality. No acute osseous findings. IMPRESSION: 1. Moderate bilateral pleural effusions and associated atelectasis or consolidation, increased when compared to the lung bases on examination dated 04/11/2022. 2. Diffuse bilateral bronchial wall thickening, ground-glass  airspace opacity and interseptal lobular thickening, most consistent with pulmonary edema. 3. Cardiomegaly and coronary artery disease. 4. Enlargement of the main pulmonary artery, as can be seen in pulmonary hypertension. Aortic Atherosclerosis (ICD10-I70.0). Electronically Signed   By: Delanna Ahmadi M.D.   On: 04/16/2022 12:09   MR BRAIN WO CONTRAST  Result Date: 04/16/2022 CLINICAL DATA:  Provided history: Subarachnoid hemorrhage. EXAM: MRI HEAD WITHOUT CONTRAST TECHNIQUE: Multiplanar, multiecho pulse sequences of the brain and surrounding structures were obtained without intravenous contrast. COMPARISON:  CT angiogram head/neck 04/15/2022. Head CT 04/15/2022. FINDINGS: Mild intermittent motion degradation. Brain: Mild-to-moderate generalized cerebral atrophy. Mild cerebellar atrophy. Small curvilinear focus of T2 FLAIR hyperintense signal abnormality and susceptibility-weighted signal loss along the posterior right frontal lobe, compatible with small-volume acute subarachnoid hemorrhage (for instance as seen on series 9, image 48) (series 10, image 57). 13 x 4 mm dural-based mass overlying the mid right frontal lobe, likely reflecting a meningioma (series 9, image 40). No significant mass effect upon the underlying brain parenchyma. Chronic lacunar infarcts within bilateral cerebral hemispheric white matter. Background mild for age multifocal T2 FLAIR hyperintense signal abnormality within the cerebral white matter and pons, nonspecific but compatible with chronic small vessel ischemic disease. 3-4 mm T1 and T2 FLAIR hyperintense lesion within the anterior right frontal lobe white matter with associated susceptibility-weighted signal loss (series 9, image 27) (series 10, image 33). This may reflect a cavernoma or chronic microhemorrhage. Several chronic microhemorrhages scattered elsewhere within the supratentorial and infratentorial brain. There is no acute infarct. No evidence of an intracranial mass. No  midline shift or evidence of hydrocephalus. Vascular: Maintained flow voids within the proximal large arterial vessels. Skull and upper cervical spine: No focal suspicious marrow lesion. Incompletely assessed cervical spondylosis. Sinuses/Orbits: No mass or acute finding within the imaged orbits. Prior bilateral ocular lens replacement. Small mucous retention cyst within the left maxillary sinus. IMPRESSION: 1. Small-volume acute subarachnoid hemorrhage overlying the posterior right frontal lobe, unchanged from the prior head CT of 04/15/2022. 2. 13 x 4 mm dural-based mass overlying the right frontal lobe, likely reflecting a meningioma. No significant mass effect upon the underlying brain parenchyma. 3. Chronic lacunar infarcts in the bilateral cerebral hemispheric white matter. 4. Background mild cerebral white matter and pontine chronic small vessel ischemic disease. 5. 3-4 mm lesion within the anterior right frontal lobe, which may reflect a cavernoma or chronic microhemorrhage. 6. Several chronic microhemorrhages scattered elsewhere within the supratentorial and infratentorial brain. 7. Mild-to-moderate generalized cerebral atrophy. 8. Mild cerebellar atrophy. Electronically Signed   By: Kellie Simmering D.O.   On: 04/16/2022 11:45   DG CHEST PORT 1 VIEW  Result Date: 04/16/2022 CLINICAL DATA:  Shortness of breath. EXAM: PORTABLE CHEST 1 VIEW COMPARISON:  Chest x-ray April 15, 2022. FINDINGS: Slightly more dense left basilar consolidation with similar additional patchy airspace opacities throughout the lungs. Possible trace bilateral pleural effusions. No visible pneumothorax. Similar cardiomediastinal silhouette. Median sternotomy. Mitral valve replacement. IMPRESSION: Slightly more dense left basilar consolidation with similar additional patchy airspace opacities throughout the lungs. Electronically Signed   By: Margaretha Sheffield M.D.   On: 04/16/2022 08:15  CT ANGIO HEAD NECK W WO CM (CODE  STROKE)  Result Date: 04/15/2022 CLINICAL DATA:  Acute neurologic deficit EXAM: CT ANGIOGRAPHY HEAD AND NECK TECHNIQUE: Multidetector CT imaging of the head and neck was performed using the standard protocol during bolus administration of intravenous contrast. Multiplanar CT image reconstructions and MIPs were obtained to evaluate the vascular anatomy. Carotid stenosis measurements (when applicable) are obtained utilizing NASCET criteria, using the distal internal carotid diameter as the denominator. RADIATION DOSE REDUCTION: This exam was performed according to the departmental dose-optimization program which includes automated exposure control, adjustment of the mA and/or kV according to patient size and/or use of iterative reconstruction technique. CONTRAST:  168m OMNIPAQUE IOHEXOL 350 MG/ML SOLN COMPARISON:  None Available. FINDINGS: CTA NECK FINDINGS SKELETON: There is no bony spinal canal stenosis. No lytic or blastic lesion. OTHER NECK: Normal pharynx, larynx and major salivary glands. No cervical lymphadenopathy. Unremarkable thyroid gland. UPPER CHEST: Bilateral medium-sized pleural effusions and multiple subcentimeter mediastinal lymph nodes. Moderate pulmonary edema. AORTIC ARCH: There is calcific atherosclerosis of the aortic arch. There is no aneurysm, dissection or hemodynamically significant stenosis of the visualized portion of the aorta. Conventional 3 vessel aortic branching pattern. The visualized proximal subclavian arteries are widely patent. RIGHT CAROTID SYSTEM: Normal without aneurysm, dissection or stenosis. LEFT CAROTID SYSTEM: Normal without aneurysm, dissection or stenosis. VERTEBRAL ARTERIES: Left dominant configuration. Both origins are clearly patent. There is no dissection, occlusion or flow-limiting stenosis to the skull base (V1-V3 segments). CTA HEAD FINDINGS POSTERIOR CIRCULATION: --Vertebral arteries: Left vertebral artery terminates in PICA. Otherwise normal. --Inferior  cerebellar arteries: Normal. --Basilar artery: Normal. --Superior cerebellar arteries: Normal. --Posterior cerebral arteries (PCA): Normal. ANTERIOR CIRCULATION: --Intracranial internal carotid arteries: Normal. --Anterior cerebral arteries (ACA): Normal. Both A1 segments are present. Patent anterior communicating artery (a-comm). --Middle cerebral arteries (MCA): Normal. VENOUS SINUSES: As permitted by contrast timing, patent. ANATOMIC VARIANTS: None Review of the MIP images confirms the above findings. IMPRESSION: 1. No emergent large vessel occlusion or high-grade stenosis of the intracranial arteries. 2. Bilateral medium-sized pleural effusions and moderate pulmonary edema. 3. Upper mediastinal lymphadenopathy. Electronically Signed   By: KUlyses JarredM.D.   On: 04/15/2022 20:38   DG CHEST PORT 1 VIEW  Result Date: 04/15/2022 CLINICAL DATA:  [id number] SOB (shortness of breath) [id number] EXAM: PORTABLE CHEST 1 VIEW COMPARISON:  Chest x-ray 02/13/2022 FINDINGS: The heart and mediastinal contours are within normal limits. Mitral valve replacement. Aortic calcification. Interval development of patchy airspace opacities most prominent within the right lower lung zone and retrocardiac airspace opacity. No pulmonary edema. Bilateral trace pleural effusions. No pneumothorax. No acute osseous abnormality.  Sternotomy wires are intact. IMPRESSION: 1. Multifocal pneumonia. Followup PA and lateral chest X-ray is recommended in 3-4 weeks following therapy to ensure resolution and exclude underlying malignancy. 2. Bilateral trace pleural effusions. 3.  Aortic Atherosclerosis (ICD10-I70.0). Electronically Signed   By: MIven FinnM.D.   On: 04/15/2022 19:20   [place] SITE RITE  Result Date: 04/15/2022 If Site Rite image not attached, placement could not be confirmed due to current cardiac rhythm.  CT HEAD WO CONTRAST (5MM)  Result Date: 04/15/2022 CLINICAL DATA:  Mental status change, unknown cause EXAM: CT HEAD  WITHOUT CONTRAST TECHNIQUE: Contiguous axial images were obtained from the base of the skull through the vertex without intravenous contrast. RADIATION DOSE REDUCTION: This exam was performed according to the departmental dose-optimization program which includes automated exposure control, adjustment of the mA and/or kV according to patient size and/or  use of iterative reconstruction technique. COMPARISON:  CT head 01/27/2022 BRAIN: BRAIN Cerebral ventricle sizes are concordant with the degree of cerebral volume loss. Patchy and confluent areas of decreased attenuation are noted throughout the deep and periventricular white matter of the cerebral hemispheres bilaterally, compatible with chronic microvascular ischemic disease. No evidence of large-territorial acute infarction. No parenchymal hemorrhage. No mass lesion. Vague right frontal sulcal hyperdensity may represent a trace developing subarachnoid hemorrhage (4:40, 2:28). No mass effect or midline shift. No hydrocephalus. Basilar cisterns are patent. Vascular: No hyperdense vessel. Skull: No acute fracture or focal lesion. Sinuses/Orbits: Paranasal sinuses and mastoid air cells are clear. Bilateral lens replacement. Otherwise the orbits are unremarkable. Other: None. IMPRESSION: Vague right frontal sulcal hyperdensity may represent a trace developing subarachnoid hemorrhage. These results will be called to the ordering clinician or representative by the Radiologist Assistant, and communication documented in the PACS or [employer]. Electronically Signed   By: Iven Finn M.D.   On: 04/15/2022 17:25

## 2022-04-17 NOTE — Progress Notes (Signed)
Occupational Therapy Treatment Patient Details Name: Daniel Sharp MRN: [id number] DOB: 02-17-1933 Today's Date: 04/17/2022   History of present illness Patient is a 88 y.o. male presenting to [facility] ED on 10/5 with hematuria, subacute upper GI bleed and sepsis due to hemorrhagic cystitis. Further work-up pending. PMHx significant for L renal cell carcinoma s/p L nephrectomy 01/22/22 (did not undergo chemo or radiation), Afib, OA, HTN, MG, back surgery, cholecystectomy, appendectomy, chronic iron deficiency, and recent fall with dislocated Rt finger, fractured Rt foot, Rt 5th rib fracture. Rapid response on 10/9 for decreased responses.Head CT showed "Vague right frontal sulcal hyperdensity may represent a trace  developing subarachnoid hemorrhage"-   OT comments  Patient ws able to participate in sitting EOB with stable BP with patient reporting dizziness. Patient noted to need 2L/min O2 during activity to maintain o2 saturation. Patient was mod X2 with RW to transfer from edge of bed to recliner in room with cues for proper sequencing of task. Patient would continue to benefit from skilled OT services at this time while admitted and after d/c to address noted deficits in order to improve overall safety and independence in ADLs. Patient's discharge plan remains appropriate at this time. OT will continue to follow acutely.     Recommendations for follow up therapy are one component of a multi-disciplinary discharge planning process, led by the attending physician.  Recommendations may be updated based on patient status, additional functional criteria and insurance authorization.    Follow Up Recommendations  Skilled nursing-short term rehab (<3 hours/day)    Assistance Recommended at Discharge Frequent or constant Supervision/Assistance  Patient can return home with the following  Two people to help with walking and/or transfers;A lot of help with bathing/dressing/bathroom;Assistance with  cooking/housework;Assistance with feeding;Direct supervision/assist for medications management;Direct supervision/assist for financial management;Assist for transportation;Help with stairs or ramp for entrance   Equipment Recommendations  Other (comment) (defer to next venue)    Recommendations for Other Services      Precautions / Restrictions Precautions Precautions: Fall Precaution Comments: montior O2 Restrictions Weight Bearing Restrictions: No       Mobility Bed Mobility Overal bed mobility: Needs Assistance Bed Mobility: Supine to Sit, Sit to Supine     Supine to sit: Mod assist, +2 for safety/equipment, +2 for physical assistance     General bed mobility comments: with increased time and physical assist with cues to advance BLE to EOB and transition trunk into upright posture EOB    Transfers Overall transfer level: Needs assistance Equipment used: Rolling walker (2 wheels) Transfers: Sit to/from Stand, Bed to chair/wheelchair/BSC Sit to Stand: Mod assist, +2 safety/equipment, +2 physical assistance     Step pivot transfers: Mod assist, +2 safety/equipment, +2 physical assistance           Balance Overall balance assessment: Needs assistance Sitting-balance support: Bilateral upper extremity supported, Feet supported Sitting balance-Leahy Scale: Fair Sitting balance - Comments: minor leaning to the L   Standing balance support: Bilateral upper extremity supported, During functional activity, Reliant on assistive device for balance Standing balance-Leahy Scale: Poor Standing balance comment: reliant on external support of RW and therapist                           ADL either performed or assessed with clinical judgement   ADL Overall ADL's : Needs assistance/impaired  Toilet Transfer Details (indicate cue type and reason): patient was mod A x2 with RW to transferf rom edge of bed to recliner inr oom with  increased time on 2L/min. patient was unable to maintain O2 saturation on RA with movement dropping into 80s. bp was stable during session                Extremity/Trunk Assessment              Vision       Perception     Praxis      Cognition Arousal/Alertness: Awake/alert Behavior During Therapy: Flat affect Overall Cognitive Status: Impaired/Different from baseline                                 General Comments: noted to have increased time to respond to questions. was appropriate during session. son was present,.        Exercises      Shoulder Instructions       General Comments      Pertinent Vitals/ Pain       Pain Assessment Pain Assessment: No/denies pain  Home Living                                          Prior Functioning/Environment              Frequency  Min 2X/week        Progress Toward Goals  OT Goals(current goals can now be found in the care plan section)  Progress towards OT goals: Progressing toward goals     Plan Discharge plan remains appropriate    Co-evaluation    PT/OT/SLP Co-Evaluation/Treatment: Yes Reason for Co-Treatment: For patient/therapist safety;To address functional/ADL transfers PT goals addressed during session: Mobility/safety with mobility OT goals addressed during session: ADL's and self-care      AM-PAC OT "6 Clicks" Daily Activity     Outcome Measure   Help from another person eating meals?: A Lot Help from another person taking care of personal grooming?: A Lot Help from another person toileting, which includes using toliet, bedpan, or urinal?: Total Help from another person bathing (including washing, rinsing, drying)?: Total Help from another person to put on and taking off regular upper body clothing?: A Lot Help from another person to put on and taking off regular lower body clothing?: Total 6 Click Score: 9    End of Session Equipment Utilized  During Treatment: Gait belt;Rolling walker (2 wheels);Oxygen  OT Visit Diagnosis: Muscle weakness (generalized) (M62.81);Other symptoms and signs involving cognitive function   Activity Tolerance Patient tolerated treatment well   Patient Left in bed;with call bell/phone within reach;with bed alarm set;with family/visitor present   Nurse Communication Mobility status        Time: 1610-9604 OT Time Calculation (min): 25 min  Charges: OT General Charges $OT Visit: 1 Visit OT Treatments $Therapeutic Activity: 8-22 mins  [name], MS Acute Rehabilitation Department Office# [phone]   Marcellina Millin 04/17/2022, 11:21 AM

## 2022-04-17 NOTE — Progress Notes (Signed)
MRI reviewed, stable / known SAH, the dural based mass in the R frontal lobe is consistent with a typical meningioma, does not appear consistent with RCC metastasis or metastatic process. 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

## 2022-04-18 ENCOUNTER — Encounter: Payer: Medicare HMO | Admitting: Gastroenterology

## 2022-04-18 DIAGNOSIS — G9341 Metabolic encephalopathy: Secondary | ICD-10-CM | POA: Diagnosis not present

## 2022-04-18 DIAGNOSIS — G7000 Myasthenia gravis without (acute) exacerbation: Secondary | ICD-10-CM | POA: Diagnosis not present

## 2022-04-18 DIAGNOSIS — K922 Gastrointestinal hemorrhage, unspecified: Secondary | ICD-10-CM | POA: Diagnosis not present

## 2022-04-18 DIAGNOSIS — D509 Iron deficiency anemia, unspecified: Secondary | ICD-10-CM | POA: Diagnosis not present

## 2022-04-18 DIAGNOSIS — R933 Abnormal findings on diagnostic imaging of other parts of digestive tract: Secondary | ICD-10-CM | POA: Diagnosis not present

## 2022-04-18 DIAGNOSIS — J9601 Acute respiratory failure with hypoxia: Secondary | ICD-10-CM | POA: Diagnosis not present

## 2022-04-18 LAB — COMPREHENSIVE METABOLIC PANEL
ALT: 17 U/L (ref 0–44)
ALT: 18 U/L (ref 0–44)
AST: 20 U/L (ref 15–41)
AST: 18 U/L (ref 15–41)
Albumin: 2.5 g/dL — ABNORMAL LOW (ref 3.5–5.0)
Albumin: 2.7 g/dL — ABNORMAL LOW (ref 3.5–5.0)
Alkaline Phosphatase: 62 U/L (ref 38–126)
Alkaline Phosphatase: 67 U/L (ref 38–126)
Anion gap: 8 (ref 5–15)
Anion gap: 4 — ABNORMAL LOW (ref 5–15)
BUN: 46 mg/dL — ABNORMAL HIGH (ref 8–23)
BUN: 42 mg/dL — ABNORMAL HIGH (ref 8–23)
CO2: 34 mmol/L — ABNORMAL HIGH (ref 22–32)
CO2: 35 mmol/L — ABNORMAL HIGH (ref 22–32)
Calcium: 11.6 mg/dL — ABNORMAL HIGH (ref 8.9–10.3)
Calcium: 11.3 mg/dL — ABNORMAL HIGH (ref 8.9–10.3)
Chloride: 96 mmol/L — ABNORMAL LOW (ref 98–111)
Chloride: 98 mmol/L (ref 98–111)
Creatinine, Ser: 1.41 mg/dL — ABNORMAL HIGH (ref 0.61–1.24)
Creatinine, Ser: 1.28 mg/dL — ABNORMAL HIGH (ref 0.61–1.24)
GFR, Estimated: 48 mL/min — ABNORMAL LOW (ref >60)
GFR, Estimated: 54 mL/min — ABNORMAL LOW (ref >60)
Glucose, Bld: 91 mg/dL (ref 70–99)
Glucose, Bld: 151 mg/dL — ABNORMAL HIGH (ref 70–99)
Potassium: 4.1 mmol/L (ref 3.5–5.1)
Potassium: 4.3 mmol/L (ref 3.5–5.1)
Sodium: 138 mmol/L (ref 135–145)
Sodium: 137 mmol/L (ref 135–145)
Total Bilirubin: 0.8 mg/dL (ref 0.3–1.2)
Total Bilirubin: 0.6 mg/dL (ref 0.3–1.2)
Total Protein: 6.4 g/dL — ABNORMAL LOW (ref 6.5–8.1)
Total Protein: 6.5 g/dL (ref 6.5–8.1)

## 2022-04-18 LAB — CBC WITH DIFFERENTIAL/PLATELET
Abs Immature Granulocytes: 0.07 10*3/uL (ref 0.00–0.07)
Basophils Absolute: 0 10*3/uL (ref 0.0–0.1)
Basophils Relative: 0 %
Eosinophils Absolute: 0.1 10*3/uL (ref 0.0–0.5)
Eosinophils Relative: 1 %
HCT: 26.5 % — ABNORMAL LOW (ref 39.0–52.0)
Hemoglobin: 8.2 g/dL — ABNORMAL LOW (ref 13.0–17.0)
Immature Granulocytes: 1 %
Lymphocytes Relative: 12 %
Lymphs Abs: 1.3 10*3/uL (ref 0.7–4.0)
MCH: 29.2 pg (ref 26.0–34.0)
MCHC: 30.9 g/dL (ref 30.0–36.0)
MCV: 94.3 fL (ref 80.0–100.0)
Monocytes Absolute: 0.7 10*3/uL (ref 0.1–1.0)
Monocytes Relative: 7 %
Neutro Abs: 8.1 10*3/uL — ABNORMAL HIGH (ref 1.7–7.7)
Neutrophils Relative %: 79 %
Platelets: 213 10*3/uL (ref 150–400)
RBC: 2.81 MIL/uL — ABNORMAL LOW (ref 4.22–5.81)
RDW: 14.6 % (ref 11.5–15.5)
WBC: 10.2 10*3/uL (ref 4.0–10.5)
nRBC: 0 % (ref 0.0–0.2)

## 2022-04-18 LAB — BLOOD GAS, ARTERIAL
Acid-Base Excess: 22.1 mmol/L — ABNORMAL HIGH (ref 0.0–2.0)
Bicarbonate: 46.2 mmol/L — ABNORMAL HIGH (ref 20.0–28.0)
Drawn by: 27553
FIO2: 28 %
O2 Content: 2 L/min
O2 Saturation: 99 %
Patient temperature: 37
pCO2 arterial: 54 mmHg — ABNORMAL HIGH (ref 32–48)
pH, Arterial: 7.54 — ABNORMAL HIGH (ref 7.35–7.45)
pO2, Arterial: 83 mmHg (ref 83–108)

## 2022-04-18 LAB — PROTIME-INR
INR: 1.2 (ref 0.8–1.2)
Prothrombin Time: 14.8 seconds (ref 11.4–15.2)

## 2022-04-18 LAB — PHOSPHORUS: Phosphorus: 2.7 mg/dL (ref 2.5–4.6)

## 2022-04-18 LAB — MAGNESIUM: Magnesium: 2 mg/dL (ref 1.7–2.4)

## 2022-04-18 MED ORDER — CALCITONIN (SALMON) 200 UNIT/ML IJ SOLN: 4.0000 [IU]/kg | Freq: Two times a day (BID) | INTRAMUSCULAR | Status: DC

## 2022-04-18 MED ORDER — PEG-KCL-NACL-NASULF-NA ASC-C 100 G PO SOLR
0.5000 | Freq: Once | ORAL | Status: AC
  Administered 2022-04-18: 100 g via ORAL

## 2022-04-18 MED ORDER — BISACODYL 5 MG PO TBEC
20.0000 mg | DELAYED_RELEASE_TABLET | Freq: Once | ORAL | Status: AC
  Administered 2022-04-18: 20 mg via ORAL
  Filled 2022-04-18: qty 4

## 2022-04-18 MED ORDER — CALCITONIN (SALMON) 200 UNIT/ML IJ SOLN
4.0000 [IU]/kg | Freq: Two times a day (BID) | INTRAMUSCULAR | Status: AC
  Administered 2022-04-18 (×2): 342 [IU] via SUBCUTANEOUS
  Filled 2022-04-18 (×2): qty 1.71

## 2022-04-18 MED ORDER — PEG-KCL-NACL-NASULF-NA ASC-C 100 G PO SOLR
0.5000 | Freq: Once | ORAL | Status: AC
  Administered 2022-04-18: 100 g via ORAL
  Filled 2022-04-18: qty 1

## 2022-04-18 MED ORDER — BISACODYL 5 MG PO TBEC: 10.0000 mg | DELAYED_RELEASE_TABLET | Freq: Once | ORAL | Status: DC

## 2022-04-18 MED ORDER — METOCLOPRAMIDE HCL 5 MG/ML IJ SOLN
10.0000 mg | Freq: Once | INTRAMUSCULAR | Status: AC
  Administered 2022-04-18: 10 mg via INTRAVENOUS
  Filled 2022-04-18: qty 2

## 2022-04-18 MED ORDER — PEG-KCL-NACL-NASULF-NA ASC-C 100 G PO SOLR: 1.0000 | Freq: Once | ORAL | Status: DC

## 2022-04-18 NOTE — Progress Notes (Signed)
Neurology Progress Note  Patient ID: Following up on this 88 year old gentleman with history of kidney cancer status post left nephrectomy in July 2023,  Ocular Myasthenia Gravis since 1997 maintained on prednisone 64m daily, mestinon 6217mTID and s/p thymectomy. Baseline symptoms of diplopia, in the process of attaining prisms outpatient.  His PMH significant for afib, mitral valve annuloplasty maintained on warfarin. [facility] with [facility] hematuria/melena and anemia, was reversed with vitamin K and is not currently on anticoagulation. He has also been reporting lethargy. He was more confused and difficult to arouse over the last day and found to be in hypoxic and hypercarbic respiratory failure. Being treated for community acquired pneumonia, initially found to have pulmonary congestion on CXR and pCO2 of 70, now improved as of yesterday to pCO2 of 57, 54 today. Also found to have incidental SAH on head CT, further substantiated on MRI,for which neurosurgery following.   S:// Today, has been on BiPAP overnight again.  He is alert, conversant and making jokes.  [name] states that he has been acting "loopy" but is in good spirits overall.  He reports that his diplopia is at its baseline.  O:// Current vital signs: BP (!) 150/78   Pulse 72   Temp (!) 96.8 F (36 C) (Axillary)   Resp 20   Ht 6' 0.5" (1.842 m)   Wt 85.4 kg   SpO2 100%   BMI 25.18 kg/m  Vital signs in last 24 hours: Temp:  [96.8 F (36 C)-98.4 F (36.9 C)] 96.8 F (36 C) (10/12 0740) Pulse Rate:  [52-118] 72 (10/12 1053) Resp:  [11-26] 20 (10/12 1053) BP: (106-167)/(46-147) 150/78 (10/12 1000) SpO2:  [83 %-100 %] 100 % (10/12 1053) FiO2 (%):  [30 %] 30 % (10/12 0800) Weight:  [85.4 kg] 85.4 kg (10/12 0500) GENERAL: awake, alert, in NAD HEENT: - Normocephalic and atraumatic, dry mm, no LN++, no Thyromegally  LUNGS -  respiratory effort is normal, Single Count Breath Test 10, although  patient is counting quite slowly Ext: warm, well perfused, intact peripheral pulses, no edema  NEURO:  Mental Status: AA&Ox2, disoriented to month Language: speech hypophonic but clear, does decrease in volume towards end of sentences.   Cranial Nerves: PERRL 3--> [id number] + subjective diplopia OU intermittently (states is baseline), no increased diplopia with sustained upgaze, habitually squints right eye but states this is intentional compensation for diplopia no facial asymmetry, facial sensation intact, hearing intact, tongue/uvula/soft palate midline, 4+/5 sternocleidomastoid strength with neck flexion and extension and 5/5 trapezius muscle strength BL, head turn 4+5/5. No evidence of tongue atrophy or fibrillations Motor: 5/5 strength throughout BL upper and lower extremities in all muscle groups. No fatigable weakness in upper or lower extremities after rigorous , repetitive movement; no myoclonus noted today Tone: is normal and bulk is normal Sensation- Intact to light touch bilaterally Coordination/Complex Motor:   Finger to Nose intact BL,  Heel to shin unable to do,  Rapid alternating movement are normal,  DTRs 2+ throughout Gait: Deferred for patient safety  Medications  Current Facility-Administered Medications:    acetaminophen (TYLENOL) tablet 650 mg, 650 mg, Oral, Q6H PRN, 650 mg at 04/15/22 1429 **OR** acetaminophen (TYLENOL) suppository 650 mg, 650 mg, Rectal, Q6H PRN, Howerter, Justin B, DO   bisacodyl (DULCOLAX) EC tablet 10 mg, 10 mg, Oral, Once, Kennedy-Smith, Colleen M, NP   calcitonin (MIACALCIN) injection 342 Units, 4 Units/kg, Subcutaneous, BID, GirDwyane DeeD   Chlorhexidine  Gluconate Cloth 2 % PADS 6 each, 6 each, Topical, Daily, Raiford Noble [place], [place], 6 each at 04/18/22 0981   finasteride (PROSCAR) tablet 5 mg, 5 mg, Oral, Daily, Marton Redwood III, MD, 5 mg at 04/18/22 1914   Oral care mouth rinse, 15 mL, Mouth Rinse, 4 times per day, Alfredia Ferguson, Omair Latif, DO,  15 mL at 04/18/22 7829   Oral care mouth rinse, 15 mL, Mouth Rinse, PRN, Alfredia Ferguson, Omair Latif, DO   pantoprazole (PROTONIX) injection 40 mg, 40 mg, Intravenous, Q12H, Howerter, Justin B, DO, 40 mg at 04/18/22 0927   peg 3350 powder (MOVIPREP) kit 100 g, 0.5 kit, Oral, Once **AND** peg 3350 powder (MOVIPREP) kit 100 g, 0.5 kit, Oral, Once, Green, Terri L, RPH   predniSONE (DELTASONE) tablet 10 mg, 10 mg, Oral, Daily, Howerter, Justin B, DO, 10 mg at 04/18/22 5621   pyridostigmine (MESTINON) injection 2 mg, 2 mg, Intravenous, QID, 2 mg at 04/16/22 2123 **OR** pyridostigmine (MESTINON) tablet 60 mg, 60 mg, Oral, QID, Donnetta Simpers, MD, 60 mg at 04/18/22 3086   tamsulosin (FLOMAX) capsule 0.4 mg, 0.4 mg, Oral, Daily, Howerter, Justin B, DO, 0.4 mg at 04/18/22 0928 Labs CBC    Component Value Date/Time   WBC 10.2 04/18/2022 0257   RBC 2.81 (L) 04/18/2022 0257   HGB 8.2 (L) 04/18/2022 0257   HGB 8.1 (L) 04/03/2022 1040   HCT 26.5 (L) 04/18/2022 0257   PLT 213 04/18/2022 0257   PLT 234 04/03/2022 1040   MCV 94.3 04/18/2022 0257   MCH 29.2 04/18/2022 0257   MCHC 30.9 04/18/2022 0257   RDW 14.6 04/18/2022 0257   LYMPHSABS 1.3 04/18/2022 0257   MONOABS 0.7 04/18/2022 0257   EOSABS 0.1 04/18/2022 0257   BASOSABS 0.0 04/18/2022 0257   CMP     Component Value Date/Time   NA 138 04/18/2022 0257   K 4.1 04/18/2022 0257   CL 96 (L) 04/18/2022 0257   CO2 34 (H) 04/18/2022 0257   GLUCOSE 91 04/18/2022 0257   BUN 46 (H) 04/18/2022 0257   CREATININE 1.41 (H) 04/18/2022 0257   CALCIUM 11.6 (H) 04/18/2022 0257   PROT 6.4 (L) 04/18/2022 0257   ALBUMIN 2.5 (L) 04/18/2022 0257   AST 20 04/18/2022 0257   ALT 17 04/18/2022 0257   ALKPHOS 62 04/18/2022 0257   BILITOT 0.8 04/18/2022 0257   GFRNONAA 48 (L) 04/18/2022 0257   Imaging I have reviewed images in epic and the results pertinent to this consultation are:  CT-scan of the brain Trace acute subarachnoid hemorrhage overlying the  posterior right frontal lobe, unchanged from the brain MRI performed earlier today.  Redemonstrated 13 x 4 mm dural-based mass overlying the mid right frontal lobe, likely reflecting a meningioma. No significant mass effect upon the underlying brain parenchyma.  Background cerebral atrophy and chronic small vessel disease, stable.  MRI examination of the brain: 1. Small-volume acute subarachnoid hemorrhage overlying the posterior right frontal lobe, unchanged from the prior head CT of 04/15/2022. 2. 13 x 4 mm dural-based mass overlying the right frontal lobe, likely reflecting a meningioma. No significant mass effect upon the underlying brain parenchyma. 3. Chronic lacunar infarcts in the bilateral cerebral hemispheric white matter. 4. Background mild cerebral white matter and pontine chronic small vessel ischemic disease. 5. 3-4 mm lesion within the anterior right frontal lobe, which may reflect a cavernoma or chronic microhemorrhage. 6. Several chronic microhemorrhages scattered elsewhere within the supratentorial and infratentorial brain. 7. Mild-to-moderate generalized cerebral atrophy.  8. Mild cerebellar atrophy.  Assessment:  Daniel Sharp is an 88 y.o. male with PMH significant for afib, mechanical mitral valve repair on warfarin, HTN, and occular myasthenia maintained on prednisone 57m daily and mestinon 671mTID and s/p thymectomy, increased here to 60 mg QID. From a myasthenic perspective his respiratory mechanics have been stable.  I do not feel that his myasthenia explains his hypercarbia as that is typically a late finding.  It seems that each day his myasthenic symptoms are slightly worse in the afternoon and better in the morning.  I continue to feel that the risks of IVIG/Plex would not outweigh the benefits at this time  Weakness of neck flexion and head turning are improved today, but patient is only able to count to 10 today on a single breath.  However, he does count  quite slowly and is able to converse normally without fatigue or dyspnea.  Last NIF is -40.  Patient demonstrates good strength in all extremities without fatigable weakness.  He is mildly disoriented today and has been behaving oddly per family, but this is likely due to mild delirium / cefepime toxicity which should clear typically in 2-4 days depending on renal function.  Recommendations:  #Ocular Myasthenia Gravis -hold off on IVIG or PLEX for now with close clinical monitoring, with low threshold to treat should he exhibit sustained hypercarbia on ABG, respiratory distress, or descending muscle weakness -Continue Q4H NIFs and VC (have been stable at -40 and 0.8-0.9 when performed) - Recommend elective intubation for respiratory compromise if VC falls below 15 to 20 mL/kg and or NIFs falls below -20cm/H2O. If poor effort and not sure, can always get ABG to assess for CO2 retention. Elective intubation for airway cmopromise if he has difficulty clearing his secretions. Oxygen saturation should not be used to make decision regarding intubation. - Mestinon PO was increased to 6046mID from home dose of 69m73mD this admission. If unable to swallow, can switch from PO to IV.(30mg59mis equivalent to 1mg I62m may continue on this as he is tolerating it well, wean on outpatient basis if desired  - Medications that may worsen or trigger MG exacerbation: Class IA antiarrhythmics, magnesium, flouroquinolones, macrolides, aminoglycosides, penicillamine, curare, interferon alpha, botox, quinine. Use with caution: calcium channel blocker, beta blockers and statins. -if EGD/Colonoscopy needed from GI standpoint, no contraindication for this from neurological standpoint as long as respiratory status monitored closely, ABGs for monitoring hypercarbia obtained as needed, and low threshold for intubation maintained  -If his examination remains stable tomorrow we will likely sign off but will be available for any  worsening symptoms noted by primary team  #Chronic ischemic infarctions #Scattered microhemorrhages on SWI sequence MRI  #Stable Small Subarachnoid Hemorrhage #Atrial fibrillation Scant but cannot rule out Cerebral Amyloid Angiopathy, though this may also be related to his chronic anticoagulation.  Given he has been tolerating anticoagulation without significant hemorrhage, long-term I think risk of stroke from atrial fibrillation is greater than risk from possible CAA.  Okay to continue to hold anticoagulation at this time while pending GI procedure and given his anemia, okay to resume when ready per primary team  #GI bleed -No contraindication for intervention from a neurological perspective, please avoid anesthetic agents that would worsen myasthenia  This case was discussed and images reviewed with Dr. Bhag[place]ly [place], AGACNP-BC [facility] See Amion for schedule and pager information 04/18/2022 11:01 AM   Attending Neurologist's note:  I personally saw this patient, gathering history, performing a full neurologic examination, reviewing relevant labs, personally reviewing relevant imaging, and formulated the assessment and plan, adding the note above for completeness and clarity to accurately reflect my thoughts  He is again more fatigued on attending evaluation later in the day particularly in the bilateral hip flexors and does complain more of diplopia.  We will continue to monitor for at least one more day to confirm that he is stable  Lesleigh Noe MD-PhD [facility] [phone]  Available 7 AM to 7 PM, outside these hours please contact Neurologist on call listed on AMION

## 2022-04-18 NOTE — Progress Notes (Signed)
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  Also chronic iron deficiency anemia.  He is on chronic steroids for his ocular myasthenia and?  Mestinon  He has few month history of dark-colored stool for which outpatient EGD and colonoscopy was scheduled for April 18, 2022.  Prior to admission required 3 units of PRBC but he was admitted 04/11/2022 with severe gross hematuria suspected hemorrhagic cystitis /admission creatinine of 1.4 mg percent.  Warfarin was held.  Empiric Rocephin was started.  CT abdomen did suggest a rectal mass.  Coumadin was held.  He also had slightly high for thalassemia at 11.8 treated with hydration.  Albumin admission was low at 2.5  Course in the hospital - 04/13/2022: GI consult.  Plan for endoscopy and colonoscopy once INR normalizes [at admission 3].  Also had nonsustained V. Tach -04/14/2022: Found to be generally weak but otherwise alert.  INR 2.9.  Complains of general fatigue.  Son reported progressive weakness thought to be due to physical deconditioning from poor p.o. intake  - 04/15/2022: Progressive weakness and unresponsiveness.  Son concerned significant negative change.  Minimally responsive.  Blood gas with severe acute on chronic respiratory acidosis.  Son denies that he is a smoker having underlying lung disease.  Patient transferred to the ICU and CCM consulted.  Neuro consultation placed.  Chest x-ray suggestive of pulmonary congestion [significantly worse compared to  August 2023], CT head with trace SAH  Past Medical History:    has a past medical history of A-fib ([place]), Arthritis, Dyspnea, H/O mitral valve repair (07/2003), Hypertension, Ocular myasthenia gravis ([place]), and Pneumonia.   has a past surgical history that includes Appendectomy; Nose surgery; Mitral valve repair; Cholecystectomy; Back surgery; Finger surgery (Right); and Robotic assited partial nephrectomy (Left, 01/22/2022).   Significant Hospital Events:  04/11/2022 - admit  - 04/13/2022: GI consult.  Plan for endoscopy and colonoscopy once INR normalizes [at admission 3].  Also had nonsustained V. Tach  -04/14/2022: Found to be generally weak but otherwise alert.  INR 2.9.  Complains of general fatigue.  Son reported progressive weakness thought to be due to physical deconditioning from poor p.o. intake   - 04/15/2022: Progressive weakness and unresponsiveness.  Son concerned significant negative change.  Minimally responsive.  Blood gas with severe acute on chronic respiratory acidosis.  Son denies that he is a smoker having underlying lung disease.  Patient transferred to the ICU and CCM consulted.  Neuro consultation placed.  Chest x-ray suggestive of pulmonary congestion [significantly worse compared to August 2023], CT head with trace SAH  - Neuro consult - monitor with Nif and FVC and Abg  - BNO 313  - LTD code   - 04/16/22 - BiPAP overnight and NiF not done. Hypercapnia improved on ABG but still PCO2 > 50. Afebirl ewiht normal WBC. Worsening CXR R > L basal air space disease. Creat better to 1.2. BNP up at 300s.  PCT 0.27. ECHO pending. Got lasix x 1 yearsstraay . -2.5L negative balance. Much more awake today but confused. No hematuria. No GI bleed  - Uro  consult for gross hematuria - Rx continue tamsulosin and finaseerted. Hold off CT hemarutia protocol. 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 CAA re;ated multiple smal  hemorrhages and stable  SAH   04/17/22 - NiF -40.Marland Kitchen NSGY consult - supportive care Uro consult - opd follolwup. Afebrile. . On abc. No bleeding. On 2L [place] . Being diuresed  -6L [place]. Off 2L [place] and pulse ox 96%. RN concnerned about frequent NSVTACh in setting of c hronic A Fib. ECHO - ok. Last EKG 2d ago with normal QTc. Mag normal. Son at bedside.  Feels dad is doing ok but less goofy/chirpy today  Interim History / Subjective:   04/18/2022 - Calcium levels continue to be mild to moderate high  and range bound. CO2 still hgh bbut better. Lasix stopped after Creat jumped up and ABG with metab alkalosis. Afebrile.  Pulse ox 83% RA -> 2L 100%.  STil pleasantly confused but color and strength better - per son . NIF Good  Objective   Blood pressure (!) 155/69, pulse 78, temperature (!) 96.8 F (36 C), temperature source Axillary, resp. rate 15, height 6' 0.5" (1.842 m), weight 85.4 kg, SpO2 100 %.    FiO2 (%):  [30 %] 30 %   Intake/Output Summary (Last 24 hours) at 04/18/2022 0911 Last data filed at 04/18/2022 0500 Gross per 24 hour  Intake 114.23 ml  Output 2000 ml  Net -1885.77 ml   Filed Weights   04/14/22 0526 04/17/22 0401 04/18/22 0500  Weight: 85.9 kg 84.4 kg 85.4 kg     General Appearance:  Looks chronically unwell but beter Head:  Normocephalic, without obvious abnormality, atraumatic Eyes:  PERRL - yes, conjunctiva/corneas - no. NO  PTOSIS     Ears:  Normal external ear canals, both ears Nose:  G tube - no but has [place] Throat:  ETT TUBE - no , OG tube - no Neck:  Supple,  No enlargement/tenderness/nodules Lungs: Clear to auscultation bilaterally, in front but CRACKLES in base Heart:  S1 and S2 normal, no murmur, CVP - no.  Pressors - no Abdomen:  Soft, no masses, no organomegaly Genitalia / Rectal:  Not done Extremities:  Extremities- intact Skin:  Intact in exposed areas . Sacral area - not examined Neurologic:  Sedation - none -> RASS - +1 . Moves all 4s - yes. CAM-ICU - POSITIVE . Orientation -  PARTIAL    Assessment & Plan:   Acute on chronic respiratory acidosis with hypoxemic respiratory failure with pulmonary congestion/inffltates-04/15/2022 - CT/ECho suggests acute on chronic diast chf  04/18/2022 -> improved hypercarbia  as of yesterday. but still high CO2. Needing 2L  (RA 83%) CT chest Acute on chronic diast chf. NIV and VC good. Lasix stopped due to pre-renal AKI  P:   O2 for pusle ox > 92% (able to wean off 04/17/22) - continue 2L  BiPAP to continue day prn + NIGHT MANDATORTY Low threshold to intubate given myasthenia gravis  - per neuro: elective intubation if VC falls below 15 to 20 mL/kg and or NIFs falls below -20cm/H2O Cxr 04/19/22 ABG 04/19/22  ?  History of pain requiring opioids History of ocular myasthenia gravis -on prednisone and Mestinon MIld MG flare 04/15/22 Acute on chronic encephalopathy -04/15/2022 CT head with small trace subarachnoid hemorrhage 04/15/2022 [seen by neurosurgery] MRI 04/16/12 with microhge c/w CAA and also Right frontal hemangioma - likely present on admit  - 04/18/2022 bilateral ptosis resolved x  48-72h and metntal stuats improved but stil plesanatly confused. Confusion new isse in hospital  P:   Expectant followup for CNS lesions For MG  - Q4h Nif monitoring - continue prednsione and mestinon - holding off IVIG/PLEX for now  Avoid the following meds in the setting of myasthenia   - As a rule, the listed drugs should be avoided whenever possible, and patients with MG should be followed closely when any new drug is introduced.  Drugs that may exacerbate MG   Antibiotics   Aminoglycosides: e.g., streptomycin, tobramycin, kanamycin   Quinolones: e.g., ciprofloxacin, levofloxacin, ofloxacin, gatifloxacin   Macrolides: e.g., erythromycin, azithromycin, telithromycin   Nondepolarizing muscle relaxants for surgery   d-Tubocurarine (curare), pancuronium, vecuronium, atracurium   Beta-blocking agents   Propranolol, atenolol,  metoprolol   Local anesthetics and related agents   Procaine, Xylocaine in large amounts   Procainamide (for arrhythmias)   Botulinum toxin   Botox exacerbates weakness.   Quinine derivatives   Quinine, quinidine, chloroquine, mefloquine ([place])   Magnesium   Decreases ACh release   Penicillamine   May cause MG   Drugs with important interactions in MG   Cyclosporine   Broad range of drug interactions, which may raise or lower cyclosporine levels   Azathioprine   Avoid allopurinol; c    History of hypertension on amlodipine   Plan  - Holding amlodipine at the moment    Chronic cor pulmonale with pulmonary systolic pressure in the 77N with reduction in right ventricular function -baseline August 2023 -on Lasix. Acute on chronic DCHF - 04/15/22  10/12 - is -8.5L after getting 500cc fluid yesterday. Lasix held du eto AKI pre-renal  P: Hold lasix and monitor    Known atrial fibrillation on Coumadin  -Coumadin on hold since admission given hematuria and GI bleed history Frequent NS Vtach 04/17/22  10/11  -  Anticoagulation on hold secondary to anticipated endoscopy and colonoscopy . INR now corrected. TEle with slow A Fib   P: Telemetry Hold anticoagulation If persists, triad to conssider Cards consult    Culture-negative sepsis suspected at admission secondary to hemorrhagic cystitis - Present on Admit Concern for HAP 04/15/22 (low grade feve 10/7 with rise in wbc to 11K)  04/18/2022 -afebrile and so far culrure negative  P:   Ceftriaxone empiric 04/11/2022 >>Cefepime 04/16/22>>  04/18/22 Monitor off abx     BPH on Flomax at home Status post nephrectomy for renal cell cancer July 2023 CKD baseline creatinine 1.2-1.6 Admitted with severe gross hematuria -Uro consutl 04/16/22 - rec OPD followup  04/18/2022 -creatinine range bound but wen tup with lasix.  Also metabolic alkaloss No hematuria.  Plan  -  Monitor off lasix - OPD uro followup  Metab Alk -   new 04/17/22  Plan = recheck abg 04/19/22 off lasix x 36h and if needed do free ater  Hypercalcemia at admission dx pre-admit and stopped oral calciu - Prior to & Present on Admit  -   04/18/2022 - stil range bound an dmoderately elevated. Unclear if contributing to acute enceph and muscle weakness in setting of MG and metabolic issues  P: D/w Dr Sabino Gasser - shared decision making with son -> Rx with calcitonin and reasess     History of GI bleed prior to admission -outpatient endoscopy and colonoscopy scheduled for 04/18/2022 -on outpatient PPI Discovery of potential rectal polyp mass at this admission   04/18/2022 -awaiting endoscopy and colonoscopy pending correction of INR/.. No gross bleeding. Resp status improved 10/11  and likely allows for inpatien endoscopy  P:   Stress ulcer prophylaxis GI consult following  - if current status holds ok for UGI/colonoscopy - prob best window 04/17/22 or 04/18/22 in ICU or endoscopy suite but recommend post procedure ICU monitoring x 24h - will get ABG 04/19/22 to help risk assess for sedation   Anemia of GI bleed and now also hematuria at admission - Present on Admit   04/18/2022: No active GI bleeding or hematuria  P:  - Coumadin on hold - PRBC for hgb </= 6.9gm%    - exceptions are   -  if ACS susepcted/confirmed then transfuse for hgb </= 8.0gm%,  or    -  active bleeding with hemodynamic instability, then transfuse regardless of hemoglobin value   At at all times try to transfuse 1 unit prbc as possible with exception of active hemorrhage     At risk for hypo and hyperglycemia At risk for relative adrenal insufficiency  P:   SSI Might need stress dose steroids but myasthenia gravis weighs   Chronic prednisone therapy -at admission Mild protein calorie malnutrition -at admission Physical deconditioning - worse during stay  Plan  -PT OT consult as needed - OOB chair - monitor    Best practice (daily eval):   Diet: per triad Pain/Anxiety/Delirium protocol (if indicated): x  VAP protocol (if indicated): Oral care DVT prophylaxis: Chronic anticoagulation warfarin currently on hold GI prophylaxis: PPI Glucose control: SSI Mobility: Bedrest Code Status: Partial code basd on IPAL 04/15/22 Family Communication: Son updated at the bedside 04/15/22 and 04/16/22 and 04/17/22, 04/18/22 Disposition: ICU  Others : PICC ordered  Triad PRimary - ccm consult. If intubated ccm primary     ATTESTATION & SIGNATURE    Dr. Brand Males, M.D., [name].C.P Pulmonary and Critical Care Medicine Medical Director - [facility] ICU Staff Physician, [facility] Pager: [phone], If no answer or between  15:00h - 7:00h: call [phone]  04/18/2022 9:11 AM    LABS    PULMONARY Recent Labs  Lab 04/15/22 1706 04/15/22 2128 04/16/22 0418 04/17/22 1405  PHART 7.28* 7.34* 7.39 7.54*  PCO2ART 70* 63* 57* 54*  PO2ART 102 108 112* 83  HCO3 33.4* 34.3* 34.3* 46.2*  O2SAT 99 99.2 99.1 99    CBC Recent Labs  Lab 04/16/22 0239 04/17/22 0257 04/18/22 0257  HGB 7.6* 8.4* 8.2*  HCT 25.6* 27.6* 26.5*  WBC 9.3 10.0 10.2  PLT 198 155 213    COAGULATION Recent Labs  Lab 04/14/22 0445 04/15/22 0351 04/16/22 0239 04/17/22 0257 04/18/22 0257  INR 2.9* 2.5* 1.5* 1.2 1.2    CARDIAC  No results for input(s): "TROPONINI" in the last 168 hours. No results for input(s): "PROBNP" in the last 168 hours.  CHEMISTRY Recent Labs  Lab 04/14/22 0445 04/15/22 0351 04/15/22 1708 04/15/22 1856 04/16/22 0239 04/17/22 0257 04/17/22 1203 04/17/22 2057 04/18/22 0257  NA 137   < > 132* 132* 136 137 137 136 138  K 5.1   < > 5.4* 5.3* 4.9 4.0 3.7 4.0 4.1  CL 102   < > 100 100 99 97* 95* 95* 96*  CO2 30   < > 32 29 33* 32 34* 34* 34*  GLUCOSE 98   < > 126* 143* 108* 83 124* 119* 91  BUN 37*   < > 37* 34* 39* 41* 45* 39* 46*  CREATININE 1.17   <  >  1.29* 1.17 1.20 1.23 1.40* 1.37* 1.41*  CALCIUM 11.5*   < > 11.3* 11.3* 11.8* 11.8* 12.0* 11.4* 11.6*  MG 2.2  --  2.3 2.0 2.2 2.1  --   --  2.0  PHOS 2.7  --  3.5  --  3.3 2.8  --   --  2.7   < > = values in this interval not displayed.   Estimated Creatinine Clearance: 41.1 mL/min (A) (by C-G formula based on SCr of 1.41 mg/dL (H)).   LIVER Recent Labs  Lab 04/14/22 0445 04/15/22 0351 04/15/22 1708 04/16/22 0239 04/17/22 0257 04/17/22 1203 04/17/22 2057 04/18/22 0257  AST 17  --    < > $R'15 16 16 15 20  'gS$ ALT 21  --    < > $R'22 20 20 18 17  'mH$ ALKPHOS 68  --    < > 68 66 69 61 62  BILITOT 0.8  --    < > 0.8 0.8 0.9 0.7 0.8  PROT 6.4*  --    < > 5.9* 6.1* 6.9 6.2* 6.4*  ALBUMIN 2.6*  --    < > 2.3* 2.5* 2.7* 2.5* 2.5*  INR 2.9* 2.5*  --  1.5* 1.2  --   --  1.2   < > = values in this interval not displayed.     INFECTIOUS Recent Labs  Lab 04/11/22 2022 04/15/22 1856 04/16/22 0239 04/17/22 0257  LATICACIDVEN 1.4  --   --   --   PROCALCITON  --  0.18 0.27 0.23     ENDOCRINE CBG (last 3)  Recent Labs    04/15/22 1708 04/16/22 0632 04/16/22 1932  GLUCAP 132* 99 107*         IMAGING x48h  - image(s) personally visualized  -   highlighted in bold DG CHEST PORT 1 VIEW  Result Date: 04/17/2022 CLINICAL DATA:  Shortness of breath EXAM: PORTABLE CHEST 1 VIEW COMPARISON:  Chest CT April 16, 2022 and chest radiograph April 16, 2022. FINDINGS: The heart size and mediastinal contours are enlarged, unchanged. Prior median sternotomy. Aortic atherosclerosis. Mitral valve replacement. Similar veiling bilateral opacities reflecting layering pleural effusions and adjacent atelectasis/consolidation. Bilateral interstitial opacities are also unchanged which may reflect infection or edema. No acute osseous abnormality IMPRESSION: Similar bilateral pleural effusions with adjacent infiltrate or atelectasis. Stable cardiomegaly with bilateral interstitial opacities favor pulmonary  edema. Electronically Signed   By: Dahlia Bailiff M.D.   On: 04/17/2022 08:30   ECHOCARDIOGRAM COMPLETE  Result Date: 04/16/2022    ECHOCARDIOGRAM REPORT   Patient Name:   Daniel Sharp Date of Exam: 04/16/2022 Medical Rec #:  [id number]   Height:       72.5 in Accession #:    [id number]  Weight:       189.4 lb Date of Birth:  Jun 03, 1933   BSA:          2.092 m Patient Age:    89 years    BP:           134/63 mmHg Patient Gender: M           HR:           77 bpm. Exam Location:  Inpatient Procedure: 2D Echo Indications:    Cardiomyopathy  History:        Patient has prior history of Echocardiogram examinations, most                 recent 02/14/2022. Cardiomyopathy, MV repair, Arrythmias:Atrial  Fibrillation, Signs/Symptoms:Edema; Risk Factors:Hypertension.  Sonographer:    Harvie Junior Referring Phys: [place]  1. Left ventricular ejection fraction, by estimation, is 55%. The left ventricle has low normal function. The left ventricle has no regional wall motion abnormalities. There is severe concentric left ventricular hypertrophy. Left ventricular diastolic parameters are indeterminate.  2. Right ventricular systolic function is normal. The right ventricular size is mildly enlarged. There is normal pulmonary artery systolic pressure.  3. Left atrial size was moderately dilated.  4. Large pleural effusion in the left lateral region.  5. The mitral valve has been repaired/replaced. Mild to moderate mitral valve regurgitation and eccentric, best seen in subcostal views  6. The aortic valve is tricuspid. Aortic valve regurgitation is mild. Comparison(s): Similar MR; has had intermittent atrial fibrillation through this study. FINDINGS  Left Ventricle: Left ventricular ejection fraction, by estimation, is 55%. The left ventricle has low normal function. The left ventricle has no regional wall motion abnormalities. The left ventricular internal cavity size was normal in size.  There is severe concentric left ventricular hypertrophy. Left ventricular diastolic parameters are indeterminate. Right Ventricle: The right ventricular size is mildly enlarged. No increase in right ventricular wall thickness. Right ventricular systolic function is normal. There is normal pulmonary artery systolic pressure. The tricuspid regurgitant velocity is 2.74  m/s, and with an assumed right atrial pressure of 3 mmHg, the estimated right ventricular systolic pressure is 78.5 mmHg. Left Atrium: Left atrial size was moderately dilated. Right Atrium: Right atrial size was normal in size. Pericardium: Trivial pericardial effusion is present. Mitral Valve: The mitral valve has been repaired/replaced. Mild to moderate mitral valve regurgitation. Tricuspid Valve: The tricuspid valve is normal in structure. Tricuspid valve regurgitation is mild . No evidence of tricuspid stenosis. Aortic Valve: The aortic valve is tricuspid. Aortic valve regurgitation is mild. Aortic regurgitation PHT measures 680 msec. Aortic valve mean gradient measures 4.0 mmHg. Aortic valve peak gradient measures 7.5 mmHg. Aortic valve area, by VTI measures 3.38 cm. Pulmonic Valve: The pulmonic valve was normal in structure. Pulmonic valve regurgitation is not visualized. No evidence of pulmonic stenosis. Aorta: The aortic root and ascending aorta are structurally normal, with no evidence of dilitation. IAS/Shunts: No atrial level shunt detected by color flow Doppler. Additional Comments: There is a large pleural effusion in the left lateral region.  LEFT VENTRICLE PLAX 2D LVIDd:         4.80 cm      Diastology LVIDs:         3.50 cm      LV e' medial:    5.98 cm/s LV PW:         1.40 cm      LV E/e' medial:  21.1 LV IVS:        1.60 cm      LV e' lateral:   11.50 cm/s LVOT diam:     2.30 cm      LV E/e' lateral: 11.0 LV SV:         61 LV SV Index:   29 LVOT Area:     4.15 cm  LV Volumes (MOD) LV vol d, MOD A2C: 146.0 ml LV vol d, MOD A4C: 115.0  ml LV vol s, MOD A2C: 53.5 ml LV vol s, MOD A4C: 47.2 ml LV SV MOD A2C:     92.5 ml LV SV MOD A4C:     115.0 ml LV SV MOD BP:      79.5 ml  RIGHT VENTRICLE RV Basal diam:  4.20 cm RV Mid diam:    3.70 cm RV S prime:     23.20 cm/s TAPSE (M-mode): 1.4 cm LEFT ATRIUM              Index        RIGHT ATRIUM           Index LA diam:        4.20 cm  2.01 cm/m   RA Area:     20.60 cm LA Vol (A2C):   111.0 ml 53.05 ml/m  RA Volume:   63.00 ml  30.11 ml/m LA Vol (A4C):   78.3 ml  37.42 ml/m LA Biplane Vol: 95.7 ml  45.74 ml/m  AORTIC VALVE                    PULMONIC VALVE AV Area (Vmax):    3.43 cm     PV Vmax:       1.36 m/s AV Area (Vmean):   3.14 cm     PV Peak grad:  7.4 mmHg AV Area (VTI):     3.38 cm AV Vmax:           137.00 cm/s AV Vmean:          98.600 cm/s AV VTI:            0.182 m AV Peak Grad:      7.5 mmHg AV Mean Grad:      4.0 mmHg LVOT Vmax:         113.00 cm/s LVOT Vmean:        74.600 cm/s LVOT VTI:          0.148 m LVOT/AV VTI ratio: 0.81 AI PHT:            680 msec  AORTA Ao Root diam: 3.70 cm Ao Asc diam:  3.50 cm MITRAL VALVE                TRICUSPID VALVE MV Area (PHT): 4.06 cm     TR Peak grad:   30.0 mmHg MV Decel Time: 187 msec     TR Vmax:        274.00 cm/s MR Peak grad: 99.9 mmHg MR Vmax:      499.67 cm/s   SHUNTS MV E velocity: 126.00 cm/s  Systemic VTI:  0.15 m MV A velocity: 94.00 cm/s   Systemic Diam: 2.30 cm MV E/A ratio:  1.34 Rudean Haskell MD Electronically signed by Rudean Haskell MD Signature Date/Time: 04/16/2022/3:37:21 PM    Final    CT HEAD WO CONTRAST (5MM)  Result Date: 04/16/2022 CLINICAL DATA:  Provided history: Subarachnoid hemorrhage. EXAM: CT HEAD WITHOUT CONTRAST TECHNIQUE: Contiguous axial images were obtained from the base of the skull through the vertex without intravenous contrast. RADIATION DOSE REDUCTION: This exam was performed according to the departmental dose-optimization program which includes automated exposure control, adjustment of  the mA and/or kV according to patient size and/or use of iterative reconstruction technique. COMPARISON:  Brain MRI 04/16/2022. Non-contrast head CT 04/15/2022. CT angiogram head/neck 04/15/2022. FINDINGS: Brain: Mild-to-moderate generalized cerebral atrophy. Mild cerebellar atrophy. Trace acute subarachnoid hemorrhage overlying the posterior right frontal lobe, unchanged (series 2, image 27) (series 5, image 41) (series 6, image 22). 13 x 4 mm dural-based mass overlying the mid right frontal lobe, likely reflecting a meningioma. No significant mass effect upon the underlying brain parenchyma. Chronic lacunar infarcts in bilateral cerebral hemispheric white matter,  better appreciated on the same-day brain MRI. Background mild chronic small ischemic changes within the cerebral white matter and pons. No demarcated cortical infarct No midline shift or hydrocephalus. Vascular: No hyperdense vessel. Atherosclerotic calcifications. Skull: No fracture or aggressive osseous lesion. Sinuses/Orbits: No mass or acute finding within the imaged orbits. No significant paranasal sinus disease at the imaged levels. IMPRESSION: Trace acute subarachnoid hemorrhage overlying the posterior right frontal lobe, unchanged from the brain MRI performed earlier today. Redemonstrated 13 x 4 mm dural-based mass overlying the mid right frontal lobe, likely reflecting a meningioma. No significant mass effect upon the underlying brain parenchyma. Background cerebral atrophy and chronic small vessel disease, stable. Electronically Signed   By: Kellie Simmering D.O.   On: 04/16/2022 12:16   CT CHEST WO CONTRAST  Result Date: 04/16/2022 CLINICAL DATA:  Sepsis EXAM: CT CHEST WITHOUT CONTRAST TECHNIQUE: Multidetector CT imaging of the chest was performed following the standard protocol without IV contrast. RADIATION DOSE REDUCTION: This exam was performed according to the departmental dose-optimization program which includes automated exposure  control, adjustment of the mA and/or kV according to patient size and/or use of iterative reconstruction technique. COMPARISON:  CT abdomen pelvis, 04/11/2022 FINDINGS: Cardiovascular: Aortic atherosclerosis. Cardiomegaly. Three-vessel coronary artery calcifications. Enlargement of the main pulmonary artery measuring up to 3.8 cm. No pericardial effusion. Mediastinum/Nodes: Prominent mediastinal lymph nodes. Thyroid gland, trachea, and esophagus demonstrate no significant findings. Lungs/Pleura: Moderate bilateral pleural effusions and associated atelectasis or consolidation increased when compared to the lung bases on examination dated 04/11/2022. Diffuse bilateral bronchial wall thickening. Ground-glass airspace opacity and interseptal lobular thickening. Upper Abdomen: No acute abnormality. Musculoskeletal: No chest wall abnormality. No acute osseous findings. IMPRESSION: 1. Moderate bilateral pleural effusions and associated atelectasis or consolidation, increased when compared to the lung bases on examination dated 04/11/2022. 2. Diffuse bilateral bronchial wall thickening, ground-glass airspace opacity and interseptal lobular thickening, most consistent with pulmonary edema. 3. Cardiomegaly and coronary artery disease. 4. Enlargement of the main pulmonary artery, as can be seen in pulmonary hypertension. Aortic Atherosclerosis (ICD10-I70.0). Electronically Signed   By: Delanna Ahmadi M.D.   On: 04/16/2022 12:09   MR BRAIN WO CONTRAST  Result Date: 04/16/2022 CLINICAL DATA:  Provided history: Subarachnoid hemorrhage. EXAM: MRI HEAD WITHOUT CONTRAST TECHNIQUE: Multiplanar, multiecho pulse sequences of the brain and surrounding structures were obtained without intravenous contrast. COMPARISON:  CT angiogram head/neck 04/15/2022. Head CT 04/15/2022. FINDINGS: Mild intermittent motion degradation. Brain: Mild-to-moderate generalized cerebral atrophy. Mild cerebellar atrophy. Small curvilinear focus of T2 FLAIR  hyperintense signal abnormality and susceptibility-weighted signal loss along the posterior right frontal lobe, compatible with small-volume acute subarachnoid hemorrhage (for instance as seen on series 9, image 48) (series 10, image 57). 13 x 4 mm dural-based mass overlying the mid right frontal lobe, likely reflecting a meningioma (series 9, image 40). No significant mass effect upon the underlying brain parenchyma. Chronic lacunar infarcts within bilateral cerebral hemispheric white matter. Background mild for age multifocal T2 FLAIR hyperintense signal abnormality within the cerebral white matter and pons, nonspecific but compatible with chronic small vessel ischemic disease. 3-4 mm T1 and T2 FLAIR hyperintense lesion within the anterior right frontal lobe white matter with associated susceptibility-weighted signal loss (series 9, image 27) (series 10, image 33). This may reflect a cavernoma or chronic microhemorrhage. Several chronic microhemorrhages scattered elsewhere within the supratentorial and infratentorial brain. There is no acute infarct. No evidence of an intracranial mass. No midline shift or evidence of hydrocephalus. Vascular: Maintained flow voids within  the proximal large arterial vessels. Skull and upper cervical spine: No focal suspicious marrow lesion. Incompletely assessed cervical spondylosis. Sinuses/Orbits: No mass or acute finding within the imaged orbits. Prior bilateral ocular lens replacement. Small mucous retention cyst within the left maxillary sinus. IMPRESSION: 1. Small-volume acute subarachnoid hemorrhage overlying the posterior right frontal lobe, unchanged from the prior head CT of 04/15/2022. 2. 13 x 4 mm dural-based mass overlying the right frontal lobe, likely reflecting a meningioma. No significant mass effect upon the underlying brain parenchyma. 3. Chronic lacunar infarcts in the bilateral cerebral hemispheric white matter. 4. Background mild cerebral white matter and  pontine chronic small vessel ischemic disease. 5. 3-4 mm lesion within the anterior right frontal lobe, which may reflect a cavernoma or chronic microhemorrhage. 6. Several chronic microhemorrhages scattered elsewhere within the supratentorial and infratentorial brain. 7. Mild-to-moderate generalized cerebral atrophy. 8. Mild cerebellar atrophy. Electronically Signed   By: Kellie Simmering D.O.   On: 04/16/2022 11:45

## 2022-04-18 NOTE — Progress Notes (Signed)
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  He was admitted on 04/11/22 with melanotic stools and gross hematuria.  He had been undergoing outpatient GI evaluation for EGD and colonoscopy. After admission he became grossly weaker with worsened respiratory status as well.  He was found to have hypercarbic respiratory failure and transferred to the ICU.  Pulmonology and neurology were also consulted.  He was placed on BiPAP with improvement in his mentation and respiratory status.  He was also noted to have volume overload and was started on diuresis, again with good response. He was not felt to warrant IVIG. He underwent brain imaging and was found to have [place], dural based mass in the right frontal lobe consistent with typical meningioma, and multiple small microhemorrhages consistent with cerebral amyloid angiopathy considered mild in severity. He was evaluated by neurosurgery due to these findings and recommended to have no further intervention or work-up for the meningioma/dural based mass.  SAH was also considered stable.  Interval History:  No events overnight.  Daniel Sharp present bedside this morning.  He does state that his dad still appears to be "pleasantly confused".  Patient is however still oriented but does have some delayed mentation and intermittent confusions. He was oriented to name, year, president.  He was unable to name the hospital/or where he was.  Assessment and Plan:  Acute hypoxic hypercarbic respiratory failure -Presumed multifactorial in setting of volume overload and hypercarbia.  There was some initial concern for  contribution from myasthenia gravis as well. -Patient responded well to diuresis and BiPAP use - appreciate pulmonology and neuro insight; patient considered appropriate for EGD/colonoscopy; if were to decompensate then intubation would be recommended (he appears to be steadily improving and O2 requirement is minimal ~2L). Given events of hospitalization, suspect inpatient scopes would be more prudent in case of complications  GIB Chronic IDA - on oral iron at home - low iron stores on iron studies on 9/20 and 10/6 - s/p 2 units PRBC on 04/09/22 (1 unit on 9/26) - GI following; tentative plans are for EGD/colonoscopy when able (when INR appropriate) and when felt to be stable enough - concern for possible rectal mass on CT on 10/5 as well - currently, clinical status has improved (see respiratory failure as well); patient felt to be able to undergo procedures as per pulmonology and neurology   Acute metabolic encephalopathy  - seems labile at times  - due to hypercarbia and deconditioning; some component of hospital delirium and cannot fully rule out hypercalcemia playing a factor - now improved s/p BiPAP use - repeat ABG if further lethargy/somnolence  - Given good clinical response in general over the past 24 hours, hold off on palliative care consult at this time - see hypercalcemia  Hypercalcemia - some possible contribution to mentation from hyperCa - corrected Ca this morning is 12.8; given mentation, will treat further with 2 doses calcitonin and check Ca level inbetween doses - hold off on bisphos at this time; unable to use diuretic further in setting of renal function and patient already given IVF  CKD3a - patient has history of CKD3a. Baseline creat ~  1.3 - 1.6 - possible contribution from L nephrectomy as well - at risk for ATN with hypoxia  - continue trending renal function   Hematuria - resolved  BPH with LUTS RCC s/p left nephrectomy 01/22/22 - Appreciate urology  evaluation - Some suspicion that his hematuria may be due to prostatic origin per urology - Continue tamsulosin - Finasteride added per urology - No further hematuria since 04/11/2022.  If develops again, will reconsult urology  Acute on chronic dCHF -New echo during hospitalization; EF 55%, no RWMA; severe concentric LVH.  Indeterminate diastolic parameters.  Mild enlarged right ventricle -BNP 313; pulmonary crackles, respiratory distress/dyspnea/SOB, right pleural effusion - good response with lasix  Ocular myasthenia gravis - neurology following as well - continue NIFs as per pulmonology - continue prednisone and mestinon  -Caution with medications that can exacerbate - Neurology recommends elective intubation if were to worsen  SAH - stable Meningioma  -Appreciate neurosurgery input.  SAH stable and there are multiple small microhemorrhages consistent with cerebral amyloid angiopathy -No further work-up or intervention recommended per neurosurgery - per neurosurgery: "he likely has some mild CAA, this increases the risk of an intracerebral hemorrhage in patients with therapeutic anticoagulation"  Culture-negative sepsis Possible hemorrhagic cystitis  Concern for HAP - initially treated with cefepime; discontinued after discussion with neurology as could also be playing a factor in mentation  - now afebrile and mild leukocytosis has normalized - cefepime discontinued on 10/11; keep off abx; discussed with PCCM  NSVT - asymptomatic - monitor electrolytes   PAF Hx MV repair - on chronic Coumadin -Anticoagulation currently on hold for above work-ups to be completed  HTN - continue current treatment  Old records reviewed in assessment of this patient  Antimicrobials: Rocephin 10/5 >>10/9 Cefepime 10/10 >> 10/11   DVT prophylaxis:  SCDs Start: 04/11/22 2325   Code Status:   Code Status: Partial Code  Mobility Assessment (last 72 hours)     Mobility Assessment      Row Name 04/17/22 1313 04/17/22 1119 04/16/22 1628       What is the highest level of mobility based on the progressive mobility assessment? Level 4 (Walks with assist in room) - Balance while marching in place and cannot step forward and back - Complete Level 4 (Walks with assist in room) - Balance while marching in place and cannot step forward and back - Complete Level 1 (Bedfast) - Unable to balance while sitting on edge of bed              Barriers to discharge:  Disposition Plan:  pending clinical course Status is: Inpt  Objective: Blood pressure 125/85, pulse 91, temperature (!) 97.5 F (36.4 C), temperature source Oral, resp. rate 20, height 6' 0.5" (1.842 m), weight 85.4 kg, SpO2 96 %.  Examination:  Physical Exam Constitutional:      General: He is not in acute distress.    Appearance: Normal appearance.  HENT:     Head: Normocephalic and atraumatic.     Mouth/Throat:     Mouth: Mucous membranes are moist.  Eyes:     Extraocular Movements: Extraocular movements intact.     Comments: Mild left ptosis  Cardiovascular:     Rate and Rhythm: Normal rate and regular rhythm.     Heart sounds: Normal heart sounds.  Pulmonary:     Effort: Pulmonary effort is normal. No respiratory distress.     Breath sounds: Rhonchi present. No wheezing.  Abdominal:  General: Bowel sounds are normal. There is no distension.     Palpations: Abdomen is soft.     Tenderness: There is no abdominal tenderness.  Musculoskeletal:     Cervical back: Normal range of motion and neck supple.     Comments: Trace L>R LE edema  Skin:    General: Skin is warm and dry.  Neurological:     Mental Status: He is alert.     Comments: Generalized weakness, worse in LE. Oriented to name, year, president (did not know hospital/place)  Psychiatric:        Mood and Affect: Mood normal.        Behavior: Behavior normal.      Consultants:  PCCM Urology GI Neurosurgery - signed  off  Procedures:    Data Reviewed: Results for orders placed or performed during the hospital encounter of 04/11/22 (from the past 24 hour(s))  Comprehensive metabolic panel     Status: Abnormal   Collection Time: 04/17/22  8:57 PM  Result Value Ref Range   Sodium 136 135 - 145 mmol/L   Potassium 4.0 3.5 - 5.1 mmol/L   Chloride 95 (L) 98 - 111 mmol/L   CO2 34 (H) 22 - 32 mmol/L   Glucose, Bld 119 (H) 70 - 99 mg/dL   BUN 39 (H) 8 - 23 mg/dL   Creatinine, Ser 1.37 (H) 0.61 - 1.24 mg/dL   Calcium 11.4 (H) 8.9 - 10.3 mg/dL   Total Protein 6.2 (L) 6.5 - 8.1 g/dL   Albumin 2.5 (L) 3.5 - 5.0 g/dL   AST 15 15 - 41 U/L   ALT 18 0 - 44 U/L   Alkaline Phosphatase 61 38 - 126 U/L   Total Bilirubin 0.7 0.3 - 1.2 mg/dL   GFR, Estimated 50 (L) >60 mL/min   Anion gap 7 5 - 15  Protime-INR     Status: None   Collection Time: 04/18/22  2:57 AM  Result Value Ref Range   Prothrombin Time 14.8 11.4 - 15.2 seconds   INR 1.2 0.8 - 1.2  Comprehensive metabolic panel     Status: Abnormal   Collection Time: 04/18/22  2:57 AM  Result Value Ref Range   Sodium 138 135 - 145 mmol/L   Potassium 4.1 3.5 - 5.1 mmol/L   Chloride 96 (L) 98 - 111 mmol/L   CO2 34 (H) 22 - 32 mmol/L   Glucose, Bld 91 70 - 99 mg/dL   BUN 46 (H) 8 - 23 mg/dL   Creatinine, Ser 1.41 (H) 0.61 - 1.24 mg/dL   Calcium 11.6 (H) 8.9 - 10.3 mg/dL   Total Protein 6.4 (L) 6.5 - 8.1 g/dL   Albumin 2.5 (L) 3.5 - 5.0 g/dL   AST 20 15 - 41 U/L   ALT 17 0 - 44 U/L   Alkaline Phosphatase 62 38 - 126 U/L   Total Bilirubin 0.8 0.3 - 1.2 mg/dL   GFR, Estimated 48 (L) >60 mL/min   Anion gap 8 5 - 15  Magnesium     Status: None   Collection Time: 04/18/22  2:57 AM  Result Value Ref Range   Magnesium 2.0 1.7 - 2.4 mg/dL  Phosphorus     Status: None   Collection Time: 04/18/22  2:57 AM  Result Value Ref Range   Phosphorus 2.7 2.5 - 4.6 mg/dL  CBC with Differential/Platelet     Status: Abnormal   Collection Time: 04/18/22  2:57 AM   Result Value Ref  Range   WBC 10.2 4.0 - 10.5 K/uL   RBC 2.81 (L) 4.22 - 5.81 MIL/uL   Hemoglobin 8.2 (L) 13.0 - 17.0 g/dL   HCT 26.5 (L) 39.0 - 52.0 %   MCV 94.3 80.0 - 100.0 fL   MCH 29.2 26.0 - 34.0 pg   MCHC 30.9 30.0 - 36.0 g/dL   RDW 14.6 11.5 - 15.5 %   Platelets 213 150 - 400 K/uL   nRBC 0.0 0.0 - 0.2 %   Neutrophils Relative % 79 %   Neutro Abs 8.1 (H) 1.7 - 7.7 K/uL   Lymphocytes Relative 12 %   Lymphs Abs 1.3 0.7 - 4.0 K/uL   Monocytes Relative 7 %   Monocytes Absolute 0.7 0.1 - 1.0 K/uL   Eosinophils Relative 1 %   Eosinophils Absolute 0.1 0.0 - 0.5 K/uL   Basophils Relative 0 %   Basophils Absolute 0.0 0.0 - 0.1 K/uL   Immature Granulocytes 1 %   Abs Immature Granulocytes 0.07 0.00 - 0.07 K/uL    I have Reviewed nursing notes, Vitals, and Lab results since pt's last encounter. Pertinent lab results : see above I have ordered test including BMP, CBC, Mg I have reviewed the last note from staff over past 24 hours I have discussed pt's care plan and test results with nursing staff, case manager   LOS: 6 days   Dwyane Dee, MD [facility] 04/18/2022, 3:04 PM

## 2022-04-18 NOTE — Progress Notes (Addendum)
[facility] Progress Note  CC:  Iron deficiency anemia   Subjective: He denies having any nausea or vomiting.  No abdominal pain.  No chest pain.  He is tolerating a clear liquid diet.  He passed a loose brown bowel movement earlier this morning.  No family at the bedside at this time.  Objective:  Vital signs in last 24 hours: Temp:  [97.3 F (36.3 C)-98.4 F (36.9 C)] 97.4 F (36.3 C) (10/12 0400) Pulse Rate:  [52-118] 72 (10/12 0749) Resp:  [14-26] 22 (10/12 0749) BP: (106-167)/(46-147) 132/58 (10/12 0700) SpO2:  [94 %-100 %] 100 % (10/12 0749) FiO2 (%):  [30 %] 30 % (10/12 0749) Weight:  [85.4 kg] 85.4 kg (10/12 0500) Last BM Date : 04/17/22  General: Alert fatigued appearing 88 year old male in no acute distress. Heart: Irregular rhythm, 2/6 systolic murmur. Pulm: Breath sounds clear, slightly decreased in the bases.  On 2 L nasal cannula.  BiPAP. Abdomen: Soft, nondistended.  Positive bowel sounds to all 4 quadrants. Extremities:  Without edema. Neurologic:  Alert and oriented to name.  Speech is clear.  Moves all extremities equally. Psych:  Alert and cooperative. Normal mood and affect.  Intake/Output from previous day: 10/11 0701 - 10/12 0700 In: 134.2 [I.V.:20; IV Piggyback:114.2] Out: 2850 [Urine:2850] Intake/Output this shift: No intake/output data recorded.  Lab Results: Recent Labs    04/16/22 0239 04/17/22 0257 04/18/22 0257  WBC 9.3 10.0 10.2  HGB 7.6* 8.4* 8.2*  HCT 25.6* 27.6* 26.5*  PLT 198 155 213   BMET Recent Labs    04/17/22 1203 04/17/22 2057 04/18/22 0257  NA 137 136 138  K 3.7 4.0 4.1  CL 95* 95* 96*  CO2 34* 34* 34*  GLUCOSE 124* 119* 91  BUN 45* 39* 46*  CREATININE 1.40* 1.37* 1.41*  CALCIUM 12.0* 11.4* 11.6*   LFT Recent Labs    04/18/22 0257  PROT 6.4*  ALBUMIN 2.5*  AST 20  ALT 17  ALKPHOS 62  BILITOT 0.8   PT/INR Recent Labs    04/17/22 0257 04/18/22 0257  LABPROT 15.4* 14.8  INR 1.2 1.2    Hepatitis Panel No results for input(s): "HEPBSAG", "HCVAB", "HEPAIGM", "HEPBIGM" in the last 72 hours.  DG CHEST PORT 1 VIEW  Result Date: 04/17/2022 CLINICAL DATA:  Shortness of breath EXAM: PORTABLE CHEST 1 VIEW COMPARISON:  Chest CT April 16, 2022 and chest radiograph April 16, 2022. FINDINGS: The heart size and mediastinal contours are enlarged, unchanged. Prior median sternotomy. Aortic atherosclerosis. Mitral valve replacement. Similar veiling bilateral opacities reflecting layering pleural effusions and adjacent atelectasis/consolidation. Bilateral interstitial opacities are also unchanged which may reflect infection or edema. No acute osseous abnormality IMPRESSION: Similar bilateral pleural effusions with adjacent infiltrate or atelectasis. Stable cardiomegaly with bilateral interstitial opacities favor pulmonary edema. Electronically Signed   By: Dahlia Bailiff M.D.   On: 04/17/2022 08:30   ECHOCARDIOGRAM COMPLETE  Result Date: 04/16/2022    ECHOCARDIOGRAM REPORT   Patient Name:   Daniel Sharp Date of Exam: 04/16/2022 Medical Rec #:  [id number]   Height:       72.5 in Accession #:    [id number]  Weight:       189.4 lb Date of Birth:  [birth year]   BSA:          2.092 m Patient Age:    81 years    BP:           134/63 mmHg Patient Gender: M  HR:           77 bpm. Exam Location:  Inpatient Procedure: 2D Echo Indications:    Cardiomyopathy  History:        Patient has prior history of Echocardiogram examinations, most                 recent 02/14/2022. Cardiomyopathy, MV repair, Arrythmias:Atrial                 Fibrillation, Signs/Symptoms:Edema; Risk Factors:Hypertension.  Sonographer:    Harvie Junior Referring Phys: [place]  1. Left ventricular ejection fraction, by estimation, is 55%. The left ventricle has low normal function. The left ventricle has no regional wall motion abnormalities. There is severe concentric left ventricular hypertrophy. Left  ventricular diastolic parameters are indeterminate.  2. Right ventricular systolic function is normal. The right ventricular size is mildly enlarged. There is normal pulmonary artery systolic pressure.  3. Left atrial size was moderately dilated.  4. Large pleural effusion in the left lateral region.  5. The mitral valve has been repaired/replaced. Mild to moderate mitral valve regurgitation and eccentric, best seen in subcostal views  6. The aortic valve is tricuspid. Aortic valve regurgitation is mild. Comparison(s): Similar MR; has had intermittent atrial fibrillation through this study. FINDINGS  Left Ventricle: Left ventricular ejection fraction, by estimation, is 55%. The left ventricle has low normal function. The left ventricle has no regional wall motion abnormalities. The left ventricular internal cavity size was normal in size. There is severe concentric left ventricular hypertrophy. Left ventricular diastolic parameters are indeterminate. Right Ventricle: The right ventricular size is mildly enlarged. No increase in right ventricular wall thickness. Right ventricular systolic function is normal. There is normal pulmonary artery systolic pressure. The tricuspid regurgitant velocity is 2.74  m/s, and with an assumed right atrial pressure of 3 mmHg, the estimated right ventricular systolic pressure is 08.6 mmHg. Left Atrium: Left atrial size was moderately dilated. Right Atrium: Right atrial size was normal in size. Pericardium: Trivial pericardial effusion is present. Mitral Valve: The mitral valve has been repaired/replaced. Mild to moderate mitral valve regurgitation. Tricuspid Valve: The tricuspid valve is normal in structure. Tricuspid valve regurgitation is mild . No evidence of tricuspid stenosis. Aortic Valve: The aortic valve is tricuspid. Aortic valve regurgitation is mild. Aortic regurgitation PHT measures 680 msec. Aortic valve mean gradient measures 4.0 mmHg. Aortic valve peak gradient measures  7.5 mmHg. Aortic valve area, by VTI measures 3.38 cm. Pulmonic Valve: The pulmonic valve was normal in structure. Pulmonic valve regurgitation is not visualized. No evidence of pulmonic stenosis. Aorta: The aortic root and ascending aorta are structurally normal, with no evidence of dilitation. IAS/Shunts: No atrial level shunt detected by color flow Doppler. Additional Comments: There is a large pleural effusion in the left lateral region.  LEFT VENTRICLE PLAX 2D LVIDd:         4.80 cm      Diastology LVIDs:         3.50 cm      LV e' medial:    5.98 cm/s LV PW:         1.40 cm      LV E/e' medial:  21.1 LV IVS:        1.60 cm      LV e' lateral:   11.50 cm/s LVOT diam:     2.30 cm      LV E/e' lateral: 11.0 LV SV:  61 LV SV Index:   29 LVOT Area:     4.15 cm  LV Volumes (MOD) LV vol d, MOD A2C: 146.0 ml LV vol d, MOD A4C: 115.0 ml LV vol s, MOD A2C: 53.5 ml LV vol s, MOD A4C: 47.2 ml LV SV MOD A2C:     92.5 ml LV SV MOD A4C:     115.0 ml LV SV MOD BP:      79.5 ml RIGHT VENTRICLE RV Basal diam:  4.20 cm RV Mid diam:    3.70 cm RV S prime:     23.20 cm/s TAPSE (M-mode): 1.4 cm LEFT ATRIUM              Index        RIGHT ATRIUM           Index LA diam:        4.20 cm  2.01 cm/m   RA Area:     20.60 cm LA Vol (A2C):   111.0 ml 53.05 ml/m  RA Volume:   63.00 ml  30.11 ml/m LA Vol (A4C):   78.3 ml  37.42 ml/m LA Biplane Vol: 95.7 ml  45.74 ml/m  AORTIC VALVE                    PULMONIC VALVE AV Area (Vmax):    3.43 cm     PV Vmax:       1.36 m/s AV Area (Vmean):   3.14 cm     PV Peak grad:  7.4 mmHg AV Area (VTI):     3.38 cm AV Vmax:           137.00 cm/s AV Vmean:          98.600 cm/s AV VTI:            0.182 m AV Peak Grad:      7.5 mmHg AV Mean Grad:      4.0 mmHg LVOT Vmax:         113.00 cm/s LVOT Vmean:        74.600 cm/s LVOT VTI:          0.148 m LVOT/AV VTI ratio: 0.81 AI PHT:            680 msec  AORTA Ao Root diam: 3.70 cm Ao Asc diam:  3.50 cm MITRAL VALVE                TRICUSPID VALVE  MV Area (PHT): 4.06 cm     TR Peak grad:   30.0 mmHg MV Decel Time: 187 msec     TR Vmax:        274.00 cm/s MR Peak grad: 99.9 mmHg MR Vmax:      499.67 cm/s   SHUNTS MV E velocity: 126.00 cm/s  Systemic VTI:  0.15 m MV A velocity: 94.00 cm/s   Systemic Diam: 2.30 cm MV E/A ratio:  1.34 Rudean Haskell MD Electronically signed by Rudean Haskell MD Signature Date/Time: 04/16/2022/3:37:21 PM    Final    CT HEAD WO CONTRAST (5MM)  Result Date: 04/16/2022 CLINICAL DATA:  Provided history: Subarachnoid hemorrhage. EXAM: CT HEAD WITHOUT CONTRAST TECHNIQUE: Contiguous axial images were obtained from the base of the skull through the vertex without intravenous contrast. RADIATION DOSE REDUCTION: This exam was performed according to the departmental dose-optimization program which includes automated exposure control, adjustment of the mA and/or kV according to patient size and/or use of iterative reconstruction technique. COMPARISON:  Brain MRI 04/16/2022. Non-contrast head CT 04/15/2022. CT angiogram head/neck 04/15/2022. FINDINGS: Brain: Mild-to-moderate generalized cerebral atrophy. Mild cerebellar atrophy. Trace acute subarachnoid hemorrhage overlying the posterior right frontal lobe, unchanged (series 2, image 27) (series 5, image 41) (series 6, image 22). 13 x 4 mm dural-based mass overlying the mid right frontal lobe, likely reflecting a meningioma. No significant mass effect upon the underlying brain parenchyma. Chronic lacunar infarcts in bilateral cerebral hemispheric white matter, better appreciated on the same-day brain MRI. Background mild chronic small ischemic changes within the cerebral white matter and pons. No demarcated cortical infarct No midline shift or hydrocephalus. Vascular: No hyperdense vessel. Atherosclerotic calcifications. Skull: No fracture or aggressive osseous lesion. Sinuses/Orbits: No mass or acute finding within the imaged orbits. No significant paranasal sinus disease at  the imaged levels. IMPRESSION: Trace acute subarachnoid hemorrhage overlying the posterior right frontal lobe, unchanged from the brain MRI performed earlier today. Redemonstrated 13 x 4 mm dural-based mass overlying the mid right frontal lobe, likely reflecting a meningioma. No significant mass effect upon the underlying brain parenchyma. Background cerebral atrophy and chronic small vessel disease, stable. Electronically Signed   By: Kellie Simmering D.O.   On: 04/16/2022 12:16   CT CHEST WO CONTRAST  Result Date: 04/16/2022 CLINICAL DATA:  Sepsis EXAM: CT CHEST WITHOUT CONTRAST TECHNIQUE: Multidetector CT imaging of the chest was performed following the standard protocol without IV contrast. RADIATION DOSE REDUCTION: This exam was performed according to the departmental dose-optimization program which includes automated exposure control, adjustment of the mA and/or kV according to patient size and/or use of iterative reconstruction technique. COMPARISON:  CT abdomen pelvis, 04/11/2022 FINDINGS: Cardiovascular: Aortic atherosclerosis. Cardiomegaly. Three-vessel coronary artery calcifications. Enlargement of the main pulmonary artery measuring up to 3.8 cm. No pericardial effusion. Mediastinum/Nodes: Prominent mediastinal lymph nodes. Thyroid gland, trachea, and esophagus demonstrate no significant findings. Lungs/Pleura: Moderate bilateral pleural effusions and associated atelectasis or consolidation increased when compared to the lung bases on examination dated 04/11/2022. Diffuse bilateral bronchial wall thickening. Ground-glass airspace opacity and interseptal lobular thickening. Upper Abdomen: No acute abnormality. Musculoskeletal: No chest wall abnormality. No acute osseous findings. IMPRESSION: 1. Moderate bilateral pleural effusions and associated atelectasis or consolidation, increased when compared to the lung bases on examination dated 04/11/2022. 2. Diffuse bilateral bronchial wall thickening,  ground-glass airspace opacity and interseptal lobular thickening, most consistent with pulmonary edema. 3. Cardiomegaly and coronary artery disease. 4. Enlargement of the main pulmonary artery, as can be seen in pulmonary hypertension. Aortic Atherosclerosis (ICD10-I70.0). Electronically Signed   By: Delanna Ahmadi M.D.   On: 04/16/2022 12:09   MR BRAIN WO CONTRAST  Result Date: 04/16/2022 CLINICAL DATA:  Provided history: Subarachnoid hemorrhage. EXAM: MRI HEAD WITHOUT CONTRAST TECHNIQUE: Multiplanar, multiecho pulse sequences of the brain and surrounding structures were obtained without intravenous contrast. COMPARISON:  CT angiogram head/neck 04/15/2022. Head CT 04/15/2022. FINDINGS: Mild intermittent motion degradation. Brain: Mild-to-moderate generalized cerebral atrophy. Mild cerebellar atrophy. Small curvilinear focus of T2 FLAIR hyperintense signal abnormality and susceptibility-weighted signal loss along the posterior right frontal lobe, compatible with small-volume acute subarachnoid hemorrhage (for instance as seen on series 9, image 48) (series 10, image 57). 13 x 4 mm dural-based mass overlying the mid right frontal lobe, likely reflecting a meningioma (series 9, image 40). No significant mass effect upon the underlying brain parenchyma. Chronic lacunar infarcts within bilateral cerebral hemispheric white matter. Background mild for age multifocal T2 FLAIR hyperintense signal abnormality within the cerebral white matter and pons, nonspecific but compatible  with chronic small vessel ischemic disease. 3-4 mm T1 and T2 FLAIR hyperintense lesion within the anterior right frontal lobe white matter with associated susceptibility-weighted signal loss (series 9, image 27) (series 10, image 33). This may reflect a cavernoma or chronic microhemorrhage. Several chronic microhemorrhages scattered elsewhere within the supratentorial and infratentorial brain. There is no acute infarct. No evidence of an  intracranial mass. No midline shift or evidence of hydrocephalus. Vascular: Maintained flow voids within the proximal large arterial vessels. Skull and upper cervical spine: No focal suspicious marrow lesion. Incompletely assessed cervical spondylosis. Sinuses/Orbits: No mass or acute finding within the imaged orbits. Prior bilateral ocular lens replacement. Small mucous retention cyst within the left maxillary sinus. IMPRESSION: 1. Small-volume acute subarachnoid hemorrhage overlying the posterior right frontal lobe, unchanged from the prior head CT of 04/15/2022. 2. 13 x 4 mm dural-based mass overlying the right frontal lobe, likely reflecting a meningioma. No significant mass effect upon the underlying brain parenchyma. 3. Chronic lacunar infarcts in the bilateral cerebral hemispheric white matter. 4. Background mild cerebral white matter and pontine chronic small vessel ischemic disease. 5. 3-4 mm lesion within the anterior right frontal lobe, which may reflect a cavernoma or chronic microhemorrhage. 6. Several chronic microhemorrhages scattered elsewhere within the supratentorial and infratentorial brain. 7. Mild-to-moderate generalized cerebral atrophy. 8. Mild cerebellar atrophy. Electronically Signed   By: Kellie Simmering D.O.   On: 04/16/2022 11:45    Assessment / Plan:  88 year old male with chronic iron deficiency anemia and RCC s/p left nephrectomy 01/2022 who is admitted to the hospital 04/11/2022 with melena and gross hematuria.  Endoscopic evaluation was deferred until respiratory status stabilized.  Hg 8.4 -> 8.2. INR 1.2.  Iron 16 and saturation ratios 10 on 04/12/2022.  He passed a loose brown stool earlier this morning. Hemodynamically stable.  -Clear liquid diet -NPO after midnight -Consider IV iron during this hospitalization  -EGD and colonoscopy with Dr. Rush Landmark 04/19/2022 benefits and risks discussed including risk with sedation, risk of bleeding, perforation and infection.  I discussed  EGD/colonoscopy benefits and risks with the patient's son/POA Sharla Kidney -Patient cleared by neurology and pulmonology, to proceed with EGD and colonoscopy tomorrow -CBC in am -Transfuse  PRBCs as needed -Continue PPI IV twice daily   Polypoid lesion on rectum per CT -Colonoscopy as ordered above  AKI. Cr 1.37 -> 1.41.  Acute hypercarbic respiratory failure.  Weaned off BiPAP.  On oxygen 2 L nasal cannula.  Myasthenia gravis (ocular seropositive s/p thymectomy). On Mestinon.  Subarachnoid hemorrhage, stable     Active Problems:   Renal cell carcinoma of left kidney (HCC)   Myasthenia gravis without acute exacerbation (HCC)   Hypertension   Persistent atrial fibrillation (HCC)   Hypercalcemia   IDA (iron deficiency anemia)   Gross hematuria   GIB (gastrointestinal bleeding)   Sepsis ([place])   Hemorrhagic cystitis   Pressure injury of skin   Abnormal CT scan, colon   Acute respiratory failure with hypoxia and hypercapnia (HCC)   Acute metabolic encephalopathy   BPH (benign prostatic hyperplasia)   Acute on chronic diastolic CHF (congestive heart failure) (HCC)   SAH (subarachnoid hemorrhage) (HCC)   Meningioma (HCC)   NSVT (nonsustained ventricular tachycardia) ([place])   Chronic kidney disease, stage 3a ([place])     LOS: 6 days   Noralyn Pick  04/18/2022, 09:11AM

## 2022-04-18 NOTE — Progress Notes (Signed)
NIF -47mHg

## 2022-04-18 NOTE — Progress Notes (Signed)
NIF >-47mhg

## 2022-04-18 NOTE — Progress Notes (Signed)
Patient performed NIF.  All three attempts resulted in >-40 cm H2O.

## 2022-04-18 NOTE — Progress Notes (Signed)
NIF -61mHg

## 2022-04-19 ENCOUNTER — Inpatient Hospital Stay (HOSPITAL_COMMUNITY): Payer: Medicare HMO | Admitting: Anesthesiology

## 2022-04-19 ENCOUNTER — Encounter (HOSPITAL_COMMUNITY): Payer: Self-pay | Admitting: Internal Medicine

## 2022-04-19 ENCOUNTER — Encounter (HOSPITAL_COMMUNITY)
Admission: EM | Disposition: A | Discharge status: Skilled Nursing Facility | Payer: Self-pay | Source: Home / Self Care | Attending: Internal Medicine

## 2022-04-19 ENCOUNTER — Inpatient Hospital Stay (HOSPITAL_COMMUNITY): Payer: Medicare HMO

## 2022-04-19 DIAGNOSIS — K641 Second degree hemorrhoids: Secondary | ICD-10-CM

## 2022-04-19 DIAGNOSIS — K222 Esophageal obstruction: Secondary | ICD-10-CM

## 2022-04-19 DIAGNOSIS — K922 Gastrointestinal hemorrhage, unspecified: Secondary | ICD-10-CM | POA: Diagnosis not present

## 2022-04-19 DIAGNOSIS — D124 Benign neoplasm of descending colon: Secondary | ICD-10-CM

## 2022-04-19 DIAGNOSIS — D122 Benign neoplasm of ascending colon: Secondary | ICD-10-CM

## 2022-04-19 DIAGNOSIS — K449 Diaphragmatic hernia without obstruction or gangrene: Secondary | ICD-10-CM

## 2022-04-19 DIAGNOSIS — K635 Polyp of colon: Secondary | ICD-10-CM

## 2022-04-19 DIAGNOSIS — N400 Enlarged prostate without lower urinary tract symptoms: Secondary | ICD-10-CM | POA: Diagnosis not present

## 2022-04-19 DIAGNOSIS — K3189 Other diseases of stomach and duodenum: Secondary | ICD-10-CM | POA: Diagnosis not present

## 2022-04-19 DIAGNOSIS — M1990 Unspecified osteoarthritis, unspecified site: Secondary | ICD-10-CM

## 2022-04-19 DIAGNOSIS — G9341 Metabolic encephalopathy: Secondary | ICD-10-CM | POA: Diagnosis not present

## 2022-04-19 DIAGNOSIS — G7000 Myasthenia gravis without (acute) exacerbation: Secondary | ICD-10-CM | POA: Diagnosis not present

## 2022-04-19 DIAGNOSIS — D509 Iron deficiency anemia, unspecified: Secondary | ICD-10-CM | POA: Diagnosis not present

## 2022-04-19 DIAGNOSIS — D128 Benign neoplasm of rectum: Secondary | ICD-10-CM

## 2022-04-19 DIAGNOSIS — J9601 Acute respiratory failure with hypoxia: Secondary | ICD-10-CM | POA: Diagnosis not present

## 2022-04-19 DIAGNOSIS — I5033 Acute on chronic diastolic (congestive) heart failure: Secondary | ICD-10-CM | POA: Diagnosis not present

## 2022-04-19 DIAGNOSIS — K621 Rectal polyp: Secondary | ICD-10-CM

## 2022-04-19 HISTORY — PX: COLONOSCOPY WITH PROPOFOL: SHX5780

## 2022-04-19 HISTORY — PX: SUBMUCOSAL TATTOO INJECTION: SHX6856

## 2022-04-19 HISTORY — PX: SUBMUCOSAL LIFTING INJECTION: SHX6855

## 2022-04-19 HISTORY — PX: ENDOSCOPIC MUCOSAL RESECTION: SHX6839

## 2022-04-19 HISTORY — PX: HEMOSTASIS CLIP PLACEMENT: SHX6857

## 2022-04-19 HISTORY — PX: BIOPSY: SHX5522

## 2022-04-19 HISTORY — PX: POLYPECTOMY: SHX5525

## 2022-04-19 HISTORY — PX: ESOPHAGOGASTRODUODENOSCOPY (EGD) WITH PROPOFOL: SHX5813

## 2022-04-19 LAB — CBC WITH DIFFERENTIAL/PLATELET
Abs Immature Granulocytes: 0.06 10*3/uL (ref 0.00–0.07)
Basophils Absolute: 0 10*3/uL (ref 0.0–0.1)
Basophils Relative: 0 %
Eosinophils Absolute: 0.1 10*3/uL (ref 0.0–0.5)
Eosinophils Relative: 1 %
HCT: 26.3 % — ABNORMAL LOW (ref 39.0–52.0)
Hemoglobin: 7.9 g/dL — ABNORMAL LOW (ref 13.0–17.0)
Immature Granulocytes: 1 %
Lymphocytes Relative: 12 %
Lymphs Abs: 1.1 10*3/uL (ref 0.7–4.0)
MCH: 28.6 pg (ref 26.0–34.0)
MCHC: 30 g/dL (ref 30.0–36.0)
MCV: 95.3 fL (ref 80.0–100.0)
Monocytes Absolute: 0.6 10*3/uL (ref 0.1–1.0)
Monocytes Relative: 6 %
Neutro Abs: 7.8 10*3/uL — ABNORMAL HIGH (ref 1.7–7.7)
Neutrophils Relative %: 80 %
Platelets: 217 10*3/uL (ref 150–400)
RBC: 2.76 MIL/uL — ABNORMAL LOW (ref 4.22–5.81)
RDW: 14.7 % (ref 11.5–15.5)
WBC: 9.6 10*3/uL (ref 4.0–10.5)
nRBC: 0 % (ref 0.0–0.2)

## 2022-04-19 LAB — COMPREHENSIVE METABOLIC PANEL
ALT: 18 U/L (ref 0–44)
AST: 17 U/L (ref 15–41)
Albumin: 2.5 g/dL — ABNORMAL LOW (ref 3.5–5.0)
Alkaline Phosphatase: 60 U/L (ref 38–126)
Anion gap: 8 (ref 5–15)
BUN: 38 mg/dL — ABNORMAL HIGH (ref 8–23)
CO2: 33 mmol/L — ABNORMAL HIGH (ref 22–32)
Calcium: 11.2 mg/dL — ABNORMAL HIGH (ref 8.9–10.3)
Chloride: 101 mmol/L (ref 98–111)
Creatinine, Ser: 1.19 mg/dL (ref 0.61–1.24)
GFR, Estimated: 59 mL/min — ABNORMAL LOW (ref >60)
Glucose, Bld: 90 mg/dL (ref 70–99)
Potassium: 3.9 mmol/L (ref 3.5–5.1)
Sodium: 142 mmol/L (ref 135–145)
Total Bilirubin: 0.7 mg/dL (ref 0.3–1.2)
Total Protein: 6.1 g/dL — ABNORMAL LOW (ref 6.5–8.1)

## 2022-04-19 LAB — BLOOD GAS, ARTERIAL
Acid-Base Excess: 11.8 mmol/L — ABNORMAL HIGH (ref 0.0–2.0)
Bicarbonate: 38.5 mmol/L — ABNORMAL HIGH (ref 20.0–28.0)
Delivery systems: POSITIVE
Drawn by: 422461
Expiratory PAP: 8 cmH2O
FIO2: 30 %
Inspiratory PAP: 18 cmH2O
Mode: POSITIVE
O2 Saturation: 100 %
Patient temperature: 36.5
pCO2 arterial: 57 mmHg — ABNORMAL HIGH (ref 32–48)
pH, Arterial: 7.44 (ref 7.35–7.45)
pO2, Arterial: 108 mmHg (ref 83–108)

## 2022-04-19 LAB — PROTIME-INR
INR: 1.2 (ref 0.8–1.2)
Prothrombin Time: 15.4 seconds — ABNORMAL HIGH (ref 11.4–15.2)

## 2022-04-19 LAB — PTH, INTACT AND CALCIUM
Calcium, Total (PTH): 11.9 mg/dL — ABNORMAL HIGH (ref 8.6–10.2)
PTH: 31 pg/mL (ref 15–65)

## 2022-04-19 LAB — MAGNESIUM: Magnesium: 2 mg/dL (ref 1.7–2.4)

## 2022-04-19 LAB — CALCIUM, IONIZED: Calcium, Ionized, Serum: 6.7 mg/dL — ABNORMAL HIGH (ref 4.5–5.6)

## 2022-04-19 SURGERY — ESOPHAGOGASTRODUODENOSCOPY (EGD) WITH PROPOFOL
Anesthesia: Monitor Anesthesia Care

## 2022-04-19 MED ORDER — LACTATED RINGERS IV SOLN
INTRAVENOUS | Status: DC
  Administered 2022-04-19: 1000 mL via INTRAVENOUS

## 2022-04-19 MED ORDER — SPOT INK MARKER SYRINGE KIT
PACK | SUBMUCOSAL | Status: DC | PRN
  Administered 2022-04-19: 1.5 mL via SUBMUCOSAL

## 2022-04-19 MED ORDER — BISACODYL 5 MG PO TBEC
10.0000 mg | DELAYED_RELEASE_TABLET | Freq: Once | ORAL | Status: AC
  Administered 2022-04-19: 10 mg via ORAL
  Filled 2022-04-19: qty 2

## 2022-04-19 MED ORDER — PHENYLEPHRINE HCL (PRESSORS) 10 MG/ML IV SOLN
INTRAVENOUS | Status: DC | PRN
  Administered 2022-04-19 (×5): 80 ug via INTRAVENOUS

## 2022-04-19 MED ORDER — ZOLEDRONIC ACID 4 MG/5ML IV CONC
4.0000 mg | Freq: Once | INTRAVENOUS | Status: AC
  Administered 2022-04-19: 4 mg via INTRAVENOUS
  Filled 2022-04-19: qty 5

## 2022-04-19 MED ORDER — PROPOFOL 500 MG/50ML IV EMUL
INTRAVENOUS | Status: DC | PRN
  Administered 2022-04-19: 100 ug/kg/min via INTRAVENOUS
  Administered 2022-04-19: 20 mg via INTRAVENOUS

## 2022-04-19 MED ORDER — LIDOCAINE HCL (CARDIAC) PF 100 MG/5ML IV SOSY
PREFILLED_SYRINGE | INTRAVENOUS | Status: DC | PRN
  Administered 2022-04-19: 60 mg via INTRAVENOUS

## 2022-04-19 SURGICAL SUPPLY — 25 items

## 2022-04-19 NOTE — Plan of Care (Signed)
Discussed with patient plan of care for the evening, pain management and wanting to place Bipap on early after son left at 2000 with some teach back displayed.  Problem: Education: Goal: Knowledge of General Education information will improve Description: Including pain rating scale, medication(s)/side effects and non-pharmacologic comfort measures Outcome: Progressing   Problem: Health Behavior/Discharge Planning: Goal: Ability to manage health-related needs will improve Outcome: Progressing

## 2022-04-19 NOTE — Progress Notes (Signed)
NIF -28 and VC 7.5L. Pt did well.

## 2022-04-19 NOTE — Progress Notes (Addendum)
[facility] Progress Note  CC:   Iron deficiency anemia   Subjective: He is alert. He knows he is at [facility]. He understands he is having an upper endoscopy and colonoscopy today. No N/V or abdominal pain. No CP or SOB. RN reported patient consumed 3/4th of his bowel prep last night, nearly cleared with few bits of stool. Remains NPO for EGD/colonoscopy later today. No family at the bedside at this time.    Objective:  Vital signs in last 24 hours: Temp:  [97.5 F (36.4 C)-98.1 F (36.7 C)] 98.1 F (36.7 C) (10/13 0725) Pulse Rate:  [68-94] 76 (10/13 0300) Resp:  [11-25] 21 (10/13 0300) BP: (114-155)/(41-85) 136/74 (10/13 0300) SpO2:  [83 %-100 %] 97 % (10/13 0300) FiO2 (%):  [28 %-30 %] 28 % (10/12 1527) Weight:  [84.4 kg] 84.4 kg (10/13 0500) Last BM Date : 04/18/22 General:  Alert fatigued appearing 88 year old male in no acute distress. Heart:  Irregular rhythm, systolic murmur. Pulm: On oxygen 2L [place]. Breath sounds clear, decreased in the bases. Abdomen: Soft, nondistended. Nontender.  Extremities:  Bilateral LE edema L  > R. Neurologic:  Alert and oriented to person, place and year. Speech is clear. Moves all extremities weakly. Psych:  Alert and cooperative. Normal mood and affect.  Intake/Output from previous day: 10/12 0701 - 10/13 0700 In: 720 [P.O.:720] Out: 700 [Urine:700] Intake/Output this shift: No intake/output data recorded.  Lab Results: Recent Labs    04/17/22 0257 04/18/22 0257 04/19/22 0237  WBC 10.0 10.2 9.6  HGB 8.4* 8.2* 7.9*  HCT 27.6* 26.5* 26.3*  PLT 155 213 217   BMET Recent Labs    04/18/22 0257 04/18/22 1806 04/19/22 0237  NA 138 137 142  K 4.1 4.3 3.9  CL 96* 98 101  CO2 34* 35* 33*  GLUCOSE 91 151* 90  BUN 46* 42* 38*  CREATININE 1.41* 1.28* 1.19  CALCIUM 11.6* 11.3* 11.2*   LFT Recent Labs    04/19/22 0237  PROT 6.1*  ALBUMIN 2.5*  AST 17  ALT 18  ALKPHOS 60  BILITOT 0.7   PT/INR Recent Labs     04/18/22 0257 04/19/22 0237  LABPROT 14.8 15.4*  INR 1.2 1.2   Hepatitis Panel No results for input(s): "HEPBSAG", "HCVAB", "HEPAIGM", "HEPBIGM" in the last 72 hours.  DG CHEST PORT 1 VIEW  Result Date: 04/19/2022 CLINICAL DATA:  Acute hypoxic respiratory failure. EXAM: PORTABLE CHEST 1 VIEW COMPARISON:  One-view chest x-ray 04/17/2022. CT of the chest 04/16/2022. FINDINGS: The heart is enlarged. Aeration of both lungs continues to improve. Interstitial and airspace opacities remain worse right than left. Effusions are significantly improved. IMPRESSION: 1. Improving aeration and effusions. 2. Persistent interstitial and airspace opacities remain worse right than left. Electronically Signed   By: San Morelle M.D.   On: 04/19/2022 06:59    Assessment / Plan:  88 year old male with chronic iron deficiency anemia and RCC s/p left nephrectomy 01/2022 who is admitted to the hospital 04/11/2022 with melena and gross hematuria.  Endoscopic evaluation was deferred until respiratory status stabilized.  Hg 8.4 -> 8.2 -> today Hg 7.9. INR 1.2.  Iron 16 and saturation ratios 10 on 04/12/2022. No recurrence of melena. Hemodynamically stable.  -NPO -Dulcolax '10mg'$  po x 1 with a few sips of water now then NPO, water enema x 1, discussed extra bowel prep with RN -Patient to proceed with EGD and colonoscopy with Dr. Rush Landmark. Per neurology no contraindication for endoscopic  procedures as long as respiratory status monitored closely, ABGs for monitoring hypercarbia obtained as needed, and low threshold for intubation maintained  -Hg today 7.9, defer transfusion recommendations to CCM/hospitalist  -Consider IV iron during this hospitalization  -Continue PPI IV twice daily   Polypoid lesion on rectum per CT -Colonoscopy as ordered above   AKI. Cr 1.37 -> 1.41.   Acute hypercarbic respiratory failure.  On BiPAP at night.  On oxygen 2 L nasal cannula.   Myasthenia gravis (ocular seropositive s/p  thymectomy). On Mestinon and Prednisone.    Subarachnoid hemorrhage (incidental finding per head CT0, stable  Past MV repair, anticoagulation on hold. INR 1.2.       Active Problems:   Renal cell carcinoma of left kidney (HCC)   Myasthenia gravis without acute exacerbation (HCC)   Hypertension   Persistent atrial fibrillation (HCC)   Hypercalcemia   IDA (iron deficiency anemia)   Gross hematuria   GIB (gastrointestinal bleeding)   Sepsis ([place])   Hemorrhagic cystitis   Pressure injury of skin   Abnormal CT scan, colon   Acute respiratory failure with hypoxia and hypercapnia (HCC)   Acute metabolic encephalopathy   BPH (benign prostatic hyperplasia)   Acute on chronic diastolic CHF (congestive heart failure) (HCC)   SAH (subarachnoid hemorrhage) (HCC)   Meningioma (HCC)   NSVT (nonsustained ventricular tachycardia) ([place])   Chronic kidney disease, stage 3a ([place])     LOS: 7 days   Noralyn Pick  04/19/2022, 8:43AM

## 2022-04-19 NOTE — Progress Notes (Signed)
Neurology Progress Note  Patient ID: Following up on this 88 year old gentleman with history of kidney cancer status post left nephrectomy in July 2023,  Ocular Myasthenia Gravis since 1997 maintained on prednisone '10mg'$  daily, mestinon '60mg'$  TID and s/p thymectomy. Baseline symptoms of diplopia, in the process of attaining prisms outpatient.  His PMH significant for afib, mitral valve annuloplasty maintained on warfarin. He is currently admitted to [facility] hospital with gross hematuria/melena and anemia, was reversed with vitamin K and is not currently on anticoagulation. He has also been reporting lethargy. He was more confused and difficult to arouse over the last day and found to be in hypoxic and hypercarbic respiratory failure. Being treated for community acquired pneumonia, initially found to have pulmonary congestion on CXR and pCO2 of 70, now improved as of yesterday to pCO2 of 57, 54 today. Also found to have incidental SAH on head CT, further substantiated on MRI,for which neurosurgery following.   S:// Today, has been on BiPAP overnight again but was only able to do this for a few hours due to his colonoscopy prep.  He will have his upper and lower endoscopy today.  His son reports that he is acting more like himself today and is less "loopy".  O:// Current vital signs: BP (!) 187/55   Pulse 72   Temp 98.1 F (36.7 C) (Oral)   Resp 19   Ht 6' 0.5" (1.842 m)   Wt 84.4 kg   SpO2 98%   BMI 24.89 kg/m  Vital signs in last 24 hours: Temp:  [97.5 F (36.4 C)-98.1 F (36.7 C)] 98.1 F (36.7 C) (10/13 0725) Pulse Rate:  [69-94] 72 (10/13 1100) Resp:  [14-23] 19 (10/13 1100) BP: (114-187)/(50-85) 187/55 (10/13 1100) SpO2:  [96 %-100 %] 98 % (10/13 1100) FiO2 (%):  [28 %] 28 % (10/12 1527) Weight:  [84.4 kg] 84.4 kg (10/13 0500) GENERAL: awake, alert, in NAD HEENT: - Normocephalic and atraumatic, dry mm, no LN++, no Thyromegally  LUNGS -  respiratory effort is normal, Single Count Breath  Test 24 Ext: warm, well perfused, intact peripheral pulses, no edema  NEURO:  Mental Status: AA&Ox2, disoriented to month Language: speech hypophonic but clear, does decrease in volume towards end of sentences.   Cranial Nerves: PERRL 3--> 26m. EOMI + subjective diplopia OU intermittently (states is baseline), increased diplopia with sustained upgaze, habitually squints right eye but states this is intentional compensation for diplopia no facial asymmetry, facial sensation intact, hearing intact, tongue/uvula/soft palate midline, 4+/5 sternocleidomastoid strength with neck flexion and extension and 5/5 trapezius muscle strength BL, head turn 4+5/5. No evidence of tongue atrophy or fibrillations Motor: 5/5 strength throughout BL upper extremities in all muscle groups.  4-/5 strength in BLE with drift of both LE to bed quickly. No fatigable weakness in upper or lower extremities after rigorous , repetitive movement; no myoclonus noted today Tone: is normal and bulk is normal Sensation- Intact to light touch bilaterally Coordination/Complex Motor:   Finger to Nose intact BL,  Heel to shin unable to do,  Rapid alternating movement are normal,  DTRs 2+ throughout Gait: Deferred for patient safety  Medications  Current Facility-Administered Medications:    acetaminophen (TYLENOL) tablet 650 mg, 650 mg, Oral, Q6H PRN, 650 mg at 04/15/22 1429 **OR** acetaminophen (TYLENOL) suppository 650 mg, 650 mg, Rectal, Q6H PRN, Howerter, Justin B, DO   Chlorhexidine Gluconate Cloth 2 % PADS 6 each, 6 each, Topical, Daily, [place] 6 each at 04/18/22  6384   finasteride (PROSCAR) tablet 5 mg, 5 mg, Oral, Daily, Marton Redwood III, MD, 5 mg at 04/19/22 0859   Oral care mouth rinse, 15 mL, Mouth Rinse, 4 times per day, Alfredia Ferguson, Omair Latif, DO, 15 mL at 04/19/22 0900   Oral care mouth rinse, 15 mL, Mouth Rinse, PRN, Alfredia Ferguson, Omair Latif, DO   pantoprazole (PROTONIX) injection 40 mg, 40 mg, Intravenous,  Q12H, Howerter, Justin B, DO, 40 mg at 04/19/22 0859   predniSONE (DELTASONE) tablet 10 mg, 10 mg, Oral, Daily, Howerter, Justin B, DO, 10 mg at 04/19/22 0859   pyridostigmine (MESTINON) injection 2 mg, 2 mg, Intravenous, QID, 2 mg at 04/16/22 2123 **OR** pyridostigmine (MESTINON) tablet 60 mg, 60 mg, Oral, QID, Donnetta Simpers, MD, 60 mg at 04/19/22 0859   tamsulosin (FLOMAX) capsule 0.4 mg, 0.4 mg, Oral, Daily, Howerter, Justin B, DO, 0.4 mg at 04/19/22 0859   zoledronic acid (ZOMETA) 4 mg in sodium chloride 0.9 % 100 mL IVPB, 4 mg, Intravenous, Once, Dwyane Dee, MD Labs CBC    Component Value Date/Time   WBC 9.6 04/19/2022 0237   RBC 2.76 (L) 04/19/2022 0237   HGB 7.9 (L) 04/19/2022 0237   HGB 8.1 (L) 04/03/2022 1040   HCT 26.3 (L) 04/19/2022 0237   PLT 217 04/19/2022 0237   PLT 234 04/03/2022 1040   MCV 95.3 04/19/2022 0237   MCH 28.6 04/19/2022 0237   MCHC 30.0 04/19/2022 0237   RDW 14.7 04/19/2022 0237   LYMPHSABS 1.1 04/19/2022 0237   MONOABS 0.6 04/19/2022 0237   EOSABS 0.1 04/19/2022 0237   BASOSABS 0.0 04/19/2022 0237   CMP     Component Value Date/Time   NA 142 04/19/2022 0237   K 3.9 04/19/2022 0237   CL 101 04/19/2022 0237   CO2 33 (H) 04/19/2022 0237   GLUCOSE 90 04/19/2022 0237   BUN 38 (H) 04/19/2022 0237   CREATININE 1.19 04/19/2022 0237   CALCIUM 11.2 (H) 04/19/2022 0237   PROT 6.1 (L) 04/19/2022 0237   ALBUMIN 2.5 (L) 04/19/2022 0237   AST 17 04/19/2022 0237   ALT 18 04/19/2022 0237   ALKPHOS 60 04/19/2022 0237   BILITOT 0.7 04/19/2022 0237   GFRNONAA 59 (L) 04/19/2022 0237   Imaging I have reviewed images in epic and the results pertinent to this consultation are:  CT-scan of the brain Trace acute subarachnoid hemorrhage overlying the posterior right frontal lobe, unchanged from the brain MRI performed earlier today.  Redemonstrated 13 x 4 mm dural-based mass overlying the mid right frontal lobe, likely reflecting a meningioma. No  significant mass effect upon the underlying brain parenchyma.  Background cerebral atrophy and chronic small vessel disease, stable.  MRI examination of the brain: 1. Small-volume acute subarachnoid hemorrhage overlying the posterior right frontal lobe, unchanged from the prior head CT of 04/15/2022. 2. 13 x 4 mm dural-based mass overlying the right frontal lobe, likely reflecting a meningioma. No significant mass effect upon the underlying brain parenchyma. 3. Chronic lacunar infarcts in the bilateral cerebral hemispheric white matter. 4. Background mild cerebral white matter and pontine chronic small vessel ischemic disease. 5. 3-4 mm lesion within the anterior right frontal lobe, which may reflect a cavernoma or chronic microhemorrhage. 6. Several chronic microhemorrhages scattered elsewhere within the supratentorial and infratentorial brain. 7. Mild-to-moderate generalized cerebral atrophy. 8. Mild cerebellar atrophy.  Assessment:  Daniel Sharp is an 88 y.o. male with PMH significant for afib, mitral valve annuloplasty on warfarin, HTN, and occular myasthenia  maintained on prednisone '10mg'$  daily and mestinon '60mg'$  TID and s/p thymectomy, increased here to 60 mg QID. From a myasthenic perspective his respiratory mechanics have been stable.  I do not feel that his myasthenia explains his hypercarbia as that is typically a late finding.   Today, patient's NIF remains greater than -40, and he is able to count to 24 on a single breath.  He is able to converse normally without stopping for breath, but voice does become slightly quieter towards the ends of sentences.  Patient does not demonstrate fatigable weakness today, although he can only hold BLE off the bed for a brief amount of time before they drift down.  His neck flexion, neck extension and head turning are equal in strength to yesterday's assessment.  I do not feel that he is in significant exacerbation at this time and does not need  escalation of immunosuppression  Recommendations:  #Ocular Myasthenia Gravis -hold off on IVIG or PLEX for now with close clinical monitoring, with low threshold to treat should he exhibit sustained hypercarbia on ABG, respiratory distress, or descending muscle weakness -Continue Q4H NIFs and VC (have been stable at -40 and 0.8-0.9 when performed) - Recommend elective intubation for respiratory compromise if VC falls below 15 to 20 mL/kg and or NIFs falls below -20cm/H2O. If poor effort and not sure, can always get ABG to assess for CO2 retention. Elective intubation for airway cmopromise if he has difficulty clearing his secretions. Oxygen saturation should not be used to make decision regarding intubation. - Mestinon PO was increased to '60mg'$  QID from home dose of '60mg'$  TID this admission. If unable to swallow, can switch from PO to IV.('30mg'$  PO is equivalent to '1mg'$  IV); may continue on this as he is tolerating it well, wean on outpatient basis if desired  - Medications that may worsen or trigger MG exacerbation: Class IA antiarrhythmics, magnesium, flouroquinolones, macrolides, aminoglycosides, penicillamine, curare, interferon alpha, botox, quinine. Use with caution: calcium channel blocker, beta blockers and statins. -As his examination has remained stable, we will sign off at this time, please arrange close follow-up with his outpatient neurologist Narda Amber  #Chronic ischemic infarctions #Scattered microhemorrhages on SWI sequence MRI, more likely related to chronic anticoagulation than CAA #Stable Small Subarachnoid Hemorrhage #Atrial fibrillation Given he has been tolerating anticoagulation without significant hemorrhage, long-term I think risk of stroke from atrial fibrillation is greater than risk from possible CAA.  Okay to continue to hold anticoagulation at this time while pending GI procedure and given his anemia, okay to resume when ready per primary team  This case was discussed  and images reviewed with Dr. Curly Shores.   -- West , MSN, AGACNP-BC [facility] See Amion for schedule and pager information 04/19/2022 11:37 AM   Late entry.  Examined after endoscopy/colonoscopy.  Son reports preliminary results concerning for rectal cancer.  Patient is very sleepy postoperatively but strength is stable from my prior examinations as documented before, hip flexion weakness 3/5 bilaterally, otherwise 5/5 throughout the lower extremities, 4+/5 Neck flexion, limited examination due to somnolence so difficult to ascertain diplopia or single breath count at the time of my evaluation.  Attending Neurologist's note:  I personally saw this patient, gathering history, performing a neurologic examination, reviewing relevant labs,  and formulated the assessment and plan, adding the note above for completeness and clarity to accurately reflect my thoughts  Lesleigh Noe MD-PhD [facility] [phone] Available 7 AM to 7 PM, outside these hours  please contact Neurologist on call listed on AMION

## 2022-04-19 NOTE — Op Note (Signed)
[facility] Patient Name: Daniel Sharp Procedure Date: 04/19/2022 MRN: [id number] Attending MD: Justice Britain , MD Date of Birth: [birth year] CSN: [id number] Age: 88 Admit Type: Inpatient Procedure:                Colonoscopy Indications:              Iron deficiency anemia, Abnormal CT of the GI tract Providers:                Justice Britain, MD, Carlyn Reichert, RN, William Dalton, Technician, Arnoldo Hooker, CRNA Referring MD:             Thornton Park MD, MD, Inpatient Medical Service Medicines:                Monitored Anesthesia Care Complications:            No immediate complications. Estimated Blood Loss:     Estimated blood loss was minimal. Procedure:                Pre-Anesthesia Assessment:                           - Prior to the procedure, a History and Physical                            was performed, and patient medications and                            allergies were reviewed. The patient's tolerance of                            previous anesthesia was also reviewed. The risks                            and benefits of the procedure and the sedation                            options and risks were discussed with the patient.                            All questions were answered, and informed consent                            was obtained. Prior Anticoagulants: The patient has                            taken Coumadin (warfarin), last dose was 5 days                            prior to procedure. ASA Grade Assessment: III - A                            patient with severe systemic disease. After  reviewing the risks and benefits, the patient was                            deemed in satisfactory condition to undergo the                            procedure.                           After obtaining informed consent, the colonoscope                            was passed under direct vision.  Throughout the                            procedure, the patient's blood pressure, pulse, and                            oxygen saturations were monitored continuously. The                            [id number]-HQ190L ([id number] Olympus colonoscope was                            introduced through the anus and advanced to the 5                            cm into the ileum. The colonoscopy was performed                            without difficulty. The patient tolerated the                            procedure. The quality of the bowel preparation was                            adequate. The terminal ileum, ileocecal valve,                            appendiceal orifice, and rectum were photographed. Scope In: 12:56:37 PM Scope Out: 1:50:18 PM Scope Withdrawal Time: 0 hours 49 minutes 21 seconds  Total Procedure Duration: 0 hours 53 minutes 41 seconds  Findings:      The digital rectal exam findings include palpable rectal mass/lesion and       hemorrhoids.      A moderate amount of semi-liquid stool was found in the entire colon,       interfering with visualization. Lavage of the area was performed using       copious amounts, resulting in clearance with adequate visualization.      The terminal ileum and ileocecal valve appeared normal.      12, sessile polyps were found in the rectum (2), descending colon (2),       transverse colon (3), ascending colon (4) and cecum (1). The polyps were       3 to 10 mm in size. These  polyps were removed with a cold snare.       Resection and retrieval were complete.      A 35 mm polyp was found in the proximal descending colon. The polyp was       sessile. Mostly JNET 2a with a central portion that was suggestive of       JNET 2b. Preparations were made for mucosal resection. NBI imaging and       White-light endoscopy was done to demarcate the borders of the lesion.       Everlift was injected to raise the lesion with mostly good success.       Piecemeal  mucosal resection using a snare was performed. Resection and       retrieval were complete. Fulguration to ablate the lesion resection       margin by snare tip soft coagulation was successful. To prevent bleeding       after mucosal resection, five hemostatic clips were successfully placed       (MR conditional). There was no bleeding at the end of the procedure.       Area distal to the resection was tattooed with 2 injections of Spot       (carbon black).      A polypoid and ulcerated non-obstructing medium-sized mass was found in       the distal rectum and extends to and involves the dentate line. The mass       was partially circumferential (involving one-third of the lumen       circumference). The mass measured at least three cm in length. In       addition, its diameter measured at least 3 cm. Oozing was present and       evidence of 2 areas of clot. Biopsies were taken with a cold forceps for       histology.      Multiple small-mouthed diverticula were found in the recto-sigmoid colon       and sigmoid colon.      Non-bleeding non-thrombosed external and internal hemorrhoids were found       during retroflexion, during perianal exam and during digital exam. The       hemorrhoids were Grade II (internal hemorrhoids that prolapse but reduce       spontaneously). Impression:               - Palpable rectal mass and hemorrhoids found on                            digital rectal exam.                           - Stool in the entire examined colon - lavaged with                            adequate visualization.                           - The examined portion of the ileum was normal.                           - 12, 3 to 10 mm polyps in the rectum, in the  descending colon, in the transverse colon, in the                            ascending colon and in the cecum, removed with a                            cold snare. Resected and retrieved.                            - One 35 mm polyp in the proximal descending colon,                            removed with piecemeal mucosal resection. Resected                            and retrieved. Treated with STSC. Clips (MR                            conditional) were placed. Tattooed.                           - Rule out malignancy, tumor in the distal rectum                            involving the dentate line. Biopsied.                           - Diverticulosis of the sigmoid and rectosigmoid                            colon.                           - Non-bleeding non-thrombosed external and internal                            hemorrhoids. Moderate Sedation:      Not Applicable - Patient had care per Anesthesia. Recommendation:           - The patient will be observed post-procedure,                            until all discharge criteria are met.                           - Return patient to hospital ward for ongoing care.                           - Patient has a contact number available for                            emergencies. The signs and symptoms of potential                            delayed complications were discussed with  the                            patient. Return to normal activities tomorrow.                            Written discharge instructions were provided to the                            patient.                           - Resume previous diet.                           - Await pathology results.                           - Pending pathology of the rectal mass, if this                            returns as just as pre-malignant, I would recommend                            repeat flexible sigmoidoscopy with biopsies vs                            consideration of surgical evaluation with EUA. This                            goes to the dentate line and will not be amenable                            to endoscopic resection techniques. Consideration                             of a transanal excision by surgery could be head,                            if this were to remain just pre-malignant. However,                            I have high degree concern this is already                            malignant.                           - Suspect a Pelvic MRI will be required for staging                            purposes.                           - Repeat Colonoscopy, could be considered in  91-month, pending other findings, due to the large                            piecemeal resection, and can be done by primary GI                            if necessary. Not clear overall goals and what                            health may be at that time however.                           - Anticoagulation thoughts from GI, would be to                            hold NOAC for at least 72 hours if possible, if not                            possible then close monitoring with heparin drip in                            12 hours (2 AM) without bolus and monitoring could                            be had. I do expect some old blood to come out over                            course of the next 24 hours however. This will be                            up to medical service to consider.                           - The findings and recommendations were discussed                            with the patient.                           - The findings and recommendations were discussed                            with the patient's family.                           - The findings and recommendations were discussed                            with the referring physician. Procedure Code(s):        --- Professional ---                           [id number] Colonoscopy, flexible; with endoscopic  mucosal resection                           45385, 59, Colonoscopy, flexible; with removal of                            tumor(s),  polyp(s), or other lesion(s) by snare                            technique                           45380, 31, Colonoscopy, flexible; with biopsy,                            single or multiple Diagnosis Code(s):        --- Professional ---                           K62.89, Other specified diseases of anus and rectum                           K64.1, Second degree hemorrhoids                           K62.1, Rectal polyp                           K63.5, Polyp of colon                           D49.0, Neoplasm of unspecified behavior of                            digestive system                           D50.9, Iron deficiency anemia, unspecified                           R93.3, Abnormal findings on diagnostic imaging of                            other parts of digestive tract CPT copyright 2019 [employer]. All rights reserved. The codes documented in this report are preliminary and upon coder review may  be revised to meet current compliance requirements. Justice Britain, MD 04/19/2022 2:20:27 PM Number of Addenda: 0

## 2022-04-19 NOTE — Progress Notes (Signed)
SLP Cancellation Note  Patient Details Name: Daniel Sharp MRN: [id number] DOB: Sep 15, 1932   Cancelled treatment:       Reason Eval/Treat Not Completed: Patient at procedure or test/unavailable;Medical issues which prohibited therapy (pt having endoscopy, colonoscopy today)  Kathleen Lime, MS [facility] SLP Acute Rehab Services Office [phone] Pager [phone]  Macario Golds 04/19/2022, 9:13 AM

## 2022-04-19 NOTE — Progress Notes (Signed)
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  Also chronic iron deficiency anemia.  He is on chronic steroids for his ocular myasthenia and?  Mestinon  He has few month history of dark-colored stool for which outpatient EGD and colonoscopy was scheduled for April 18, 2022.  Prior to admission required 3 units of PRBC but he was admitted 04/11/2022 with severe gross hematuria suspected hemorrhagic cystitis /admission creatinine of 1.4 mg percent.  Warfarin was held.  Empiric Rocephin was started.  CT abdomen did suggest a rectal mass.  Coumadin was held.  He also had slightly high for thalassemia at 11.8 treated with hydration.  Albumin admission was low at 2.5  Course in the hospital - 04/13/2022: GI consult.  Plan for endoscopy and colonoscopy once INR normalizes [at admission 3].  Also had nonsustained V. Tach -04/14/2022: Found to be generally weak but otherwise alert.  INR 2.9.  Complains of general fatigue.  Son reported progressive weakness thought to be due to physical deconditioning from poor p.o. intake  - 04/15/2022: Progressive weakness and unresponsiveness.  Son concerned significant negative change.  Minimally responsive.  Blood gas with severe acute on chronic respiratory acidosis.  Son denies that he is a smoker having underlying lung disease.  Patient transferred to the ICU and CCM consulted.  Neuro consultation placed.  Chest x-ray suggestive of pulmonary congestion [significantly worse compared to  August 2023], CT head with trace SAH  Past Medical History:    has a past medical history of A-fib ([place]), Arthritis, Dyspnea, H/O mitral valve repair (07/2003), Hypertension, Ocular myasthenia gravis ([place]), and Pneumonia.   has a past surgical history that includes Appendectomy; Nose surgery; Mitral valve repair; Cholecystectomy; Back surgery; Finger surgery (Right); and Robotic assited partial nephrectomy (Left, 01/22/2022).   Significant Hospital Events:  04/11/2022 - admit  - 04/13/2022: GI consult.  Plan for endoscopy and colonoscopy once INR normalizes [at admission 3].  Also had nonsustained V. Tach  -04/14/2022: Found to be generally weak but otherwise alert.  INR 2.9.  Complains of general fatigue.  Son reported progressive weakness thought to be due to physical deconditioning from poor p.o. intake   - 04/15/2022: Progressive weakness and unresponsiveness.  Son concerned significant negative change.  Minimally responsive.  Blood gas with severe acute on chronic respiratory acidosis.  Son denies that he is a smoker having underlying lung disease.  Patient transferred to the ICU and CCM consulted.  Neuro consultation placed.  Chest x-ray suggestive of pulmonary congestion [significantly worse compared to August 2023], CT head with trace SAH  - Neuro consult - monitor with Nif and FVC and Abg  - BNO 313  - LTD code   - 04/16/22 - BiPAP overnight and NiF not done. Hypercapnia improved on ABG but still PCO2 > 50. Afebirl ewiht normal WBC. Worsening CXR R > L basal air space disease. Creat better to 1.2. BNP up at 300s.  PCT 0.27. ECHO pending. Got lasix x 1 yearsstraay . -2.5L negative balance. Much more awake today but confused. No hematuria. No GI bleed  - Uro  consult for gross hematuria - Rx continue tamsulosin and finaseerted. Hold off CT hemarutia protocol. 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 CAA re;ated multiple smal  hemorrhages and stable  SAH   04/17/22 - NiF -40.Marland Kitchen NSGY consult - supportive care Uro consult - opd follolwup. Afebrile. . On abc. No bleeding. On 2L [place] . Being diuresed  -6L [place]. Off 2L [place] and pulse ox 96%. RN concnerned about frequent NSVTACh in setting of c hronic A Fib. ECHO - ok. Last EKG 2d ago with normal QTc. Mag normal. Son at bedside.  Feels dad is doing ok but less goofy/chirpy today  04/18/22 -  Calcium levels continue to be mild to moderate high  and range bound. CO2 still hgh bbut better. Lasix stopped after Creat jumped up and ABG with metab alkalosis. Afebrile.  Pulse ox 83% RA -> 2L 100%.  STil pleasantly confused but color and strength better - per son . NIF Good  Interim History / Subjective:   04/19/2022 - less goofy per son. BiPAP only few hours per RN due to bowel prep need. For UGI/Colonoscopy in few hours in endoscopy suite. Calcium still elevated after calcitonin. AFebrile. Still needing 2L [place]   Objective   Blood pressure 136/74, pulse 76, temperature 98.1 F (36.7 C), temperature source Oral, resp. rate (!) 21, height 6' 0.5" (1.842 m), weight 84.4 kg, SpO2 97 %.    FiO2 (%):  [28 %] 28 %   Intake/Output Summary (Last 24 hours) at 04/19/2022 0948 Last data filed at 04/18/2022 1833 Gross per 24 hour  Intake 720 ml  Output 700 ml  Net 20 ml   Filed Weights   04/17/22 0401 04/18/22 0500 04/19/22 0500  Weight: 84.4 kg 85.4 kg 84.4 kg    General Appearance:  Looks better but chronic unwell Head:  Normocephalic, without obvious abnormality, atraumatic Eyes:  PERRL - yes, conjunctiva/corneas - muddy     Ears:  Normal external ear canals, both ears Nose:  G tube - no but on 2L [place] Throat:  ETT TUBE - no , OG tube - no Neck:  Supple,  No enlargement/tenderness/nodules Lungs: Clear to auscultation bilaterally anteriorly. CRacjles + Heart:  S1 and S2 normal. MURMUR + Abdomen:  Soft, no masses, no organomegaly Genitalia / Rectal:  Not done Extremities:  Extremities- intact Skin:  ntact  in exposed areas . Sacral area - not examined Neurologic:  Sedation - none -> RASS - +1 . Moves all 4s - yes. CAM-ICU - goofy . Orientation - partial. Able to tell about his days in [place]:   Acute on chronic respiratory acidosis with hypoxemic respiratory failure with pulmonary congestion/inffltates-04/15/2022 - CT/ECho suggests acute on chronic diast chf  04/19/2022 -> improved hypercarbia. Needing 2L [place] (RA 83%) . NIV and VC good. Lasix stopped x 24h due to pre-renal AKI. CXR better  P:   O2 for pusle ox > 92% (able to wean off 04/17/22) - continue 2L  BiPAP to continue day prn + NIGHT MANDATORTY Low threshold to intubate given myasthenia gravis  - per neuro: elective intubation if VC falls below 15 to 20 mL/kg and or NIFs falls below -20cm/H2O   ?  History of pain requiring opioids History of ocular myasthenia gravis -on prednisone and Mestinon MIld MG flare 04/15/22 Acute on chronic encephalopathy -04/15/2022 CT head with small trace subarachnoid hemorrhage 04/15/2022 [seen by neurosurgery] MRI  04/16/12 with microhge c/w CAA and also Right frontal hemangioma - likely present on admit  - 04/19/2022 bilateral ptosis resolved x 4d ago. Confusion new isse in hospital slolwy better as of 04/19/22  P:   Expectant followup for CNS lesions Improve neuromuscular strength by correcting high calcium For MG  - Q4h Nif monitoring - continue prednsione and mestinon - holding off IVIG/PLEX for now  Avoid the following meds in the setting of myasthenia   - As a rule, the listed drugs should be avoided whenever possible, and patients with MG should be followed closely when any new drug is introduced.  Drugs that may exacerbate MG   Antibiotics   Aminoglycosides: e.g., streptomycin, tobramycin, kanamycin   Quinolones: e.g., ciprofloxacin, levofloxacin, ofloxacin, gatifloxacin   Macrolides: e.g., erythromycin, azithromycin, telithromycin   Nondepolarizing muscle  relaxants for surgery   d-Tubocurarine (curare), pancuronium, vecuronium, atracurium   Beta-blocking agents   Propranolol, atenolol, metoprolol   Local anesthetics and related agents   Procaine, Xylocaine in large amounts   Procainamide (for arrhythmias)   Botulinum toxin   Botox exacerbates weakness.   Quinine derivatives   Quinine, quinidine, chloroquine, mefloquine ([place])   Magnesium   Decreases ACh release   Penicillamine   May cause MG   Drugs with important interactions in MG   Cyclosporine   Broad range of drug interactions, which may raise or lower cyclosporine levels   Azathioprine   Avoid allopurinol; c    History of hypertension on amlodipine   Plan  - Holding amlodipine at the moment    Chronic cor pulmonale with pulmonary systolic pressure in the 79K with reduction in right ventricular function -baseline August 2023 -on Lasix. Acute on chronic [facility] - 04/15/22  10/13 - is -8.5L Lasix on hold since 04/17/22  P: Hold lasix and monitor    Known atrial fibrillation on Coumadin  -Coumadin on hold since admission given hematuria and GI bleed history  Frequent NS Vtach 04/17/22 and 04/18/22  10/13  -  Anticoagulation on hold secondary to anticipated endoscopy and colonoscopy . INR now corrected. TEle with slow A Fib   P: Telemetry Hold anticoagulation EKG 04/19/22 prior to endoscopy    Culture-negative sepsis suspected at admission secondary to hemorrhagic cystitis - Present on Admit Concern for HAP 04/15/22 (low grade feve 10/7 with rise in wbc to 11K)  04/19/2022 -afebrile and so far culrure negative  P:   Ceftriaxone empiric 04/11/2022 >>Cefepime 04/16/22>>  04/18/22 Monitor off abx     BPH on Flomax at home Status post nephrectomy for renal cell cancer July 2023 CKD baseline creatinine 1.2-1.6 Admitted with severe gross hematuria -Uro consutl 04/16/22 - rec OPD followup  04/19/2022 -creatinine range bound but wen tup with lasix.  Also  metabolic alkaloss No hematuria.  Plan  -  Monitor off lasix - OPD uro followup  Metab Alk -  new 04/17/22  04/19/22 - improved after lasix  hold  Plan = monitor expectantly  Hypercalcemia at admission dx pre-admit and stopped oral calciu - Prior to & Present on Admit  -   04/19/2022 - stil range bound an dmoderately elevated despite calcitonin 04/18/22.  Unclear if contributing to acute enceph and muscle weakness in setting of MG and metabolic issues  P: D/w Dr Sabino Gasser - shared decision making with son -> Rx with  ZOlendronate     History of GI bleed prior to admission -outpatient endoscopy and colonoscopy scheduled for 04/18/2022 -on outpatient PPI Discovery of potential rectal polyp  mass at this admission   04/19/2022 -awaiting endoscopy and colonoscop. No gross bleeding. Resp status improved 10/13  (2L [place]) and likely allows for inpatien endoscopy today. Still risk is elevated but probably the best [place]  P:   Stress ulcer prophylaxis GI consult following  - Endoscopy/colonoscopy 04/19/22 with return to ICU   Anemia of GI bleed and now also hematuria at admission - Present on Admit   04/19/2022: No active GI bleeding or hematuria  P:  - Coumadin on hold - PRBC for hgb </= 6.9gm%    - exceptions are   -  if ACS susepcted/confirmed then transfuse for hgb </= 8.0gm%,  or    -  active bleeding with hemodynamic instability, then transfuse regardless of hemoglobin value   At at all times try to transfuse 1 unit prbc as possible with exception of active hemorrhage     At risk for hypo and hyperglycemia At risk for relative adrenal insufficiency  P:   SSI Might need stress dose steroids but myasthenia gravis weighs   Chronic prednisone therapy -at admission Mild protein calorie malnutrition -at admission Physical deconditioning - worse during stay  Plan  -PT OT consult as needed - OOB chair - monitor - might needs SNF   Best practice (daily eval):   Diet: per triad Pain/Anxiety/Delirium protocol (if indicated): x  VAP protocol (if indicated): Oral care DVT prophylaxis: Chronic anticoagulation warfarin currently on hold GI prophylaxis: PPI Glucose control: SSI Mobility: Bedrest Code Status: Partial code basd on IPAL 04/15/22 Family Communication: Son updated at the bedside 04/15/22 and 04/16/22 and 04/17/22, 04/18/22 and 04/19/22 Disposition: ICU Others :  NO CVL/NO PICC  [facility] PRimary - ccm consult. If intubated ccm primary     ATTESTATION & SIGNATURE    Dr. Brand Males, M.D., [facility].C.P Pulmonary and Critical Care Medicine Medical Director - [facility] ICU Staff Physician, [facility] Pager: [phone], If no answer or between  15:00h - 7:00h: call [phone]  04/19/2022 9:48 AM    LABS    PULMONARY Recent Labs  Lab 04/15/22 1706 04/15/22 2128 04/16/22 0418 04/17/22 1405 04/19/22 0440  PHART 7.28* 7.34* 7.39 7.54* 7.44  PCO2ART 70* 63* 57* 54* 57*  PO2ART 102 108 112* 83 108  HCO3 33.4* 34.3* 34.3* 46.2* 38.5*  O2SAT 99 99.2 99.1 99 100    CBC Recent Labs  Lab 04/17/22 0257 04/18/22 0257 04/19/22 0237  HGB 8.4* 8.2* 7.9*  HCT 27.6* 26.5* 26.3*  WBC 10.0 10.2 9.6  PLT 155 213 217    COAGULATION Recent Labs  Lab 04/15/22 0351 04/16/22 0239 04/17/22 0257 04/18/22 0257 04/19/22 0237  INR 2.5* 1.5* 1.2 1.2 1.2    CARDIAC  No results for input(s): "TROPONINI" in the last 168 hours. No results for input(s): "PROBNP" in the last 168 hours.  CHEMISTRY Recent Labs  Lab 04/14/22 0445 04/15/22 0351 04/15/22 1708 04/15/22 1856 04/16/22 0239 04/17/22 0257 04/17/22 1203 04/17/22 2057 04/18/22 0257 04/18/22 1806 04/19/22 0237  NA 137   < > 132* 132* 136 137 137 136 138 137 142  K 5.1   < > 5.4* 5.3* 4.9 4.0 3.7 4.0 4.1 4.3 3.9  CL 102   < > 100 100 99 97* 95* 95* 96* 98 101  CO2 30   < > 32 29 33* 32 34* 34* 34* 35* 33*   GLUCOSE 98   < > 126* 143* 108* 83  124* 119* 91 151* 90  BUN 37*   < > 37* 34* 39* 41* 45* 39* 46* 42* 38*  CREATININE 1.17   < > 1.29* 1.17 1.20 1.23 1.40* 1.37* 1.41* 1.28* 1.19  CALCIUM 11.5*   < > 11.3* 11.3* 11.8* 11.8* 12.0* 11.4* 11.6* 11.3* 11.2*  MG 2.2  --  2.3 2.0 2.2 2.1  --   --  2.0  --  2.0  PHOS 2.7  --  3.5  --  3.3 2.8  --   --  2.7  --   --    < > = values in this interval not displayed.   Estimated Creatinine Clearance: 48.7 mL/min (by C-G formula based on SCr of 1.19 mg/dL).   LIVER Recent Labs  Lab 04/15/22 0351 04/15/22 1708 04/16/22 0239 04/17/22 0257 04/17/22 1203 04/17/22 2057 04/18/22 0257 04/18/22 1806 04/19/22 0237  AST  --    < > $R'15 16 16 15 20 18 17  'pM$ ALT  --    < > $R'22 20 20 18 17 18 18  'mQ$ ALKPHOS  --    < > 68 66 69 61 62 67 60  BILITOT  --    < > 0.8 0.8 0.9 0.7 0.8 0.6 0.7  PROT  --    < > 5.9* 6.1* 6.9 6.2* 6.4* 6.5 6.1*  ALBUMIN  --    < > 2.3* 2.5* 2.7* 2.5* 2.5* 2.7* 2.5*  INR 2.5*  --  1.5* 1.2  --   --  1.2  --  1.2   < > = values in this interval not displayed.     INFECTIOUS Recent Labs  Lab 04/15/22 1856 04/16/22 0239 04/17/22 0257  PROCALCITON 0.18 0.27 0.23     ENDOCRINE CBG (last 3)  Recent Labs    04/16/22 1932  GLUCAP 107*         IMAGING x48h  - image(s) personally visualized  -   highlighted in bold DG CHEST PORT 1 VIEW  Result Date: 04/19/2022 CLINICAL DATA:  Acute hypoxic respiratory failure. EXAM: PORTABLE CHEST 1 VIEW COMPARISON:  One-view chest x-ray 04/17/2022. CT of the chest 04/16/2022. FINDINGS: The heart is enlarged. Aeration of both lungs continues to improve. Interstitial and airspace opacities remain worse right than left. Effusions are significantly improved. IMPRESSION: 1. Improving aeration and effusions. 2. Persistent interstitial and airspace opacities remain worse right than left. Electronically Signed   By: San Morelle M.D.   On: 04/19/2022 06:59

## 2022-04-19 NOTE — Transfer of Care (Signed)
Immediate Anesthesia Transfer of Care Note  Patient: Daniel Sharp  Procedure(s) Performed: Procedure(s): ESOPHAGOGASTRODUODENOSCOPY (EGD) WITH PROPOFOL (N/A) COLONOSCOPY WITH PROPOFOL (N/A) BIOPSY POLYPECTOMY ENDOSCOPIC MUCOSAL RESECTION SUBMUCOSAL LIFTING INJECTION SUBMUCOSAL TATTOO INJECTION HEMOSTASIS CLIP PLACEMENT  Patient Location: PACU  Anesthesia Type:MAC  Level of Consciousness:  sedated, patient cooperative and responds to stimulation  Airway & Oxygen Therapy:Patient Spontanous Breathing and Patient connected to face mask oxgen  Post-op Assessment:  Report given to PACU RN and Post -op Vital signs reviewed and stable  Post vital signs:  Reviewed and stable  Last Vitals:  Vitals:   04/19/22 1100 04/19/22 1204  BP: (!) 187/55 (!) 125/57  Pulse: 72 75  Resp: 19 20  Temp:  36.8 C  SpO2: 57% 01%    Complications: No apparent anesthesia complications

## 2022-04-19 NOTE — Anesthesia Postprocedure Evaluation (Signed)
Anesthesia Post Note  Patient: Daniel Sharp  Procedure(s) Performed: ESOPHAGOGASTRODUODENOSCOPY (EGD) WITH PROPOFOL COLONOSCOPY WITH PROPOFOL BIOPSY POLYPECTOMY ENDOSCOPIC MUCOSAL RESECTION SUBMUCOSAL LIFTING INJECTION SUBMUCOSAL TATTOO INJECTION HEMOSTASIS CLIP PLACEMENT     Patient location during evaluation: PACU Anesthesia Type: MAC Level of consciousness: awake and alert Pain management: pain level controlled Vital Signs Assessment: post-procedure vital signs reviewed and stable Respiratory status: spontaneous breathing, nonlabored ventilation, respiratory function stable and patient connected to nasal cannula oxygen Cardiovascular status: stable and blood pressure returned to baseline Postop Assessment: no apparent nausea or vomiting Anesthetic complications: no   No notable events documented.  Last Vitals:  Vitals:   04/19/22 1204 04/19/22 1400  BP: (!) 125/57 120/67  Pulse: 75 76  Resp: 20 14  Temp: 36.8 C (!) 36.3 C  SpO2: 97% 100%    Last Pain:  Vitals:   04/19/22 1400  TempSrc: Temporal  PainSc:                  Daniel Sharp

## 2022-04-19 NOTE — Progress Notes (Signed)
Patient removed Bipap for a second time and requested it come off  since it was "choking him."  Tried to educate as to why he needs it on but he still refused.  Made RT aware and charted

## 2022-04-19 NOTE — Anesthesia Preprocedure Evaluation (Addendum)
Anesthesia Evaluation  Patient identified by MRN, date of birth, ID band Patient awake    Reviewed: Allergy & Precautions, NPO status , Patient's Chart, lab work & pertinent test results  History of Anesthesia Complications Negative for: history of anesthetic complications  Airway Mallampati: II  TM Distance: >3 FB Neck ROM: Full    Dental  (+) Chipped, Caps, Dental Advisory Given   Pulmonary former smoker,    breath sounds clear to auscultation       Cardiovascular hypertension, Pt. on medications (-) angina+ dysrhythmias Atrial Fibrillation + Valvular Problems/Murmurs (s/p MV repair)  Rhythm:Regular Rate:Normal  09/2021 ECHO: LVEF 53%, mild LV dilation, mod decreased RVF, trace MR s/p MV repair and annuloplasty ring   Neuro/Psych Ocular myasthenia: prednisone (last dose yesterday)    GI/Hepatic negative GI ROS, Neg liver ROS,   Endo/Other  negative endocrine ROS  Renal/GU negative Renal ROS     Musculoskeletal  (+) Arthritis ,   Abdominal   Peds  Hematology Coumadin   Anesthesia Other Findings   Reproductive/Obstetrics                             Anesthesia Physical  Anesthesia Plan  ASA: 4  Anesthesia Plan: MAC   Post-op Pain Management: Minimal or no pain anticipated   Induction: Intravenous  PONV Risk Score and Plan: 2 and Propofol infusion  Airway Management Planned: Oral ETT  Additional Equipment: None  Intra-op Plan:   Post-operative Plan: Extubation in OR  Informed Consent: I have reviewed the patients History and Physical, chart, labs and discussed the procedure including the risks, benefits and alternatives for the proposed anesthesia with the patient or authorized representative who has indicated his/her understanding and acceptance.     Dental advisory given  Plan Discussed with: CRNA and Anesthesiologist  Anesthesia Plan Comments: (See PAT note  01/09/2022  HPI: Daniel Sharp is a 88 y.o. 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   Following history is provided by the patient as well as the patient's son who is present at bedside, in addition to my discussions with the EDP and via chart review.  For the last few months, the patient has been experiencing recurrent dark-colored stool for which she has been evaluated by [facility] gastroenterology and is scheduled for EGD/colonoscopy with Dr. Tarri Glenn As an outpatient on April 18, 2022.  He also has a history of chronic iron deficiency anemia associated baseline hemoglobin 8-10, and has been requiring recurrent blood transfusions as an outpatient.  Specifically, he has required transfusion of 3 units PRBC over the last 9 days, with most recent transfusion occurring on 04-08-22.  In the setting of a history of paroxysmal atrial fibrillation he is chronically anticoagulated on warfarin, noting no additional blood thinners as an outpatient, including no concomitant aspirin use.  In this context, the patient reports that he experienced another episode of dark-colored stool yesterday, in the absence of any bright red blood per rectum.  He is on daily oral iron supplementation as an outpatient.  Denies any associated chest  pain, shortness of breath, palpitations, diaphoresis, nausea, vomiting, dizziness, presyncope, or syncope.  No hemoptysis.  No recent trauma.  Patient's medical  history is also notable for kidney cancer status post left nephrectomy in July 2023 with Dr. Lovena Neighbours Of urology at [facility].  Yesterday, the patient noted new onset gross hematuria, stating that he noted some bright red blood in his diaper, which he believed was urinary in source.  He reports associated 1 to 2 days of suprapubic discomfort in the absence of dysuria or change in urinary urgency/frequency.  Denies any recent subjective )       Anesthesia Quick Evaluation

## 2022-04-19 NOTE — Progress Notes (Signed)
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  He was admitted on 04/11/22 with melanotic stools and gross hematuria.  He had been undergoing outpatient GI evaluation for EGD and colonoscopy. After admission he became grossly weaker with worsened respiratory status as well.  He was found to have hypercarbic respiratory failure and transferred to the ICU.  Pulmonology and neurology were also consulted.  He was placed on BiPAP with improvement in his mentation and respiratory status.  He was also noted to have volume overload and was started on diuresis, again with good response. He was not felt to warrant IVIG. He underwent brain imaging and was found to have Nicholson, dural based mass in the right frontal lobe consistent with typical meningioma, and multiple small microhemorrhages consistent with cerebral amyloid angiopathy considered mild in severity. He was evaluated by neurosurgery due to these findings and recommended to have no further intervention or work-up for the meningioma/dural based mass.  SAH was also considered stable.  Interval History:  No events overnight. Breathing remains comfortable.  Plan is for EGD and colonoscopy today.  He was more oriented and thought process seemed quicker and more clear this morning.  Assessment and Plan:  Acute hypoxic hypercarbic respiratory failure -Presumed multifactorial in setting of volume overload and hypercarbia.  There was some initial concern for contribution from myasthenia gravis as well. -Patient responded well to diuresis and BiPAP use - appreciate pulmonology and neuro insight; patient  considered appropriate for EGD/colonoscopy; if were to decompensate then intubation would be recommended (he appears to be steadily improving and O2 requirement is minimal ~2L). Given events of hospitalization, suspect inpatient scopes would be more prudent in case of complications  GIB Chronic IDA - on oral iron at home - low iron stores on iron studies on 9/20 and 10/6 - s/p 2 units PRBC on 04/09/22 (1 unit on 9/26) - GI following; tentative plans are for EGD/colonoscopy when able (when INR appropriate) and when felt to be stable enough - concern for possible rectal mass on CT on 10/5 as well - currently, clinical status has improved (see respiratory failure as well); patient felt to be able to undergo procedures as per pulmonology and neurology  -Hgb down to 7.9 g/dL this morning -Follow-up findings from EGD/colonoscopy  Acute metabolic encephalopathy  - seems labile at times  - due to hypercarbia and deconditioning; some component of hospital delirium and cannot fully rule out hypercalcemia playing a factor - now improved s/p BiPAP use - repeat ABG if further lethargy/somnolence  - Given good clinical response in general over the past 24 hours, hold off on palliative care consult at this time - see hypercalcemia  Hypercalcemia - some possible contribution to mentation from hyperCa - corrected Ca this morning is slightly improved to 12.4 after 2 doses calcitonin on 10/12 - giving zometa x 1 today - hold off on further calcitonin since minimal change after 2 doses - repeat CMP in am  CKD3a - patient has history of CKD3a. Baseline creat ~ 1.3 - 1.6 - possible contribution from L nephrectomy as well - at risk for ATN with hypoxia  - continue  trending renal function  - renal fxn improved today   Hematuria - resolved  BPH with LUTS RCC s/p left nephrectomy 01/22/22 - Appreciate urology evaluation - Some suspicion that his hematuria may be due to prostatic origin per urology -  Continue tamsulosin - Finasteride added per urology - No further hematuria since 04/11/2022.  If develops again, will reconsult urology  Acute on chronic dCHF -New echo during hospitalization; EF 55%, no RWMA; severe concentric LVH.  Indeterminate diastolic parameters.  Mild enlarged right ventricle -BNP 313; pulmonary crackles, respiratory distress/dyspnea/SOB, right pleural effusion - good response with lasix which is now on hold   Ocular myasthenia gravis - neurology following as well - continue NIFs as per pulmonology - continue prednisone and mestinon  -Caution with medications that can exacerbate - Neurology recommends elective intubation if were to worsen  SAH - stable Meningioma  -Appreciate neurosurgery input.  SAH stable and there are multiple small microhemorrhages consistent with cerebral amyloid angiopathy -No further work-up or intervention recommended per neurosurgery - per neurosurgery: "he likely has some mild CAA, this increases the risk of an intracerebral hemorrhage in patients with therapeutic anticoagulation"  Culture-negative sepsis Possible hemorrhagic cystitis  Concern for HAP - initially treated with cefepime; discontinued after discussion with neurology as could also be playing a factor in mentation  - now afebrile and mild leukocytosis has normalized - cefepime discontinued on 10/11; keep off abx; discussed with PCCM  NSVT - asymptomatic - monitor electrolytes   PAF Hx MV repair - on chronic Coumadin -Anticoagulation currently on hold for above work-ups to be completed  HTN - continue current treatment  Old records reviewed in assessment of this patient  Antimicrobials: Rocephin 10/5 >>10/9 Cefepime 10/10 >> 10/11   DVT prophylaxis:  SCDs Start: 04/11/22 2325   Code Status:   Code Status: Partial Code  Mobility Assessment (last 72 hours)     Mobility Assessment     Row Name 04/17/22 1313 04/17/22 1119 04/16/22 1628       What is  the highest level of mobility based on the progressive mobility assessment? Level 4 (Walks with assist in room) - Balance while marching in place and cannot step forward and back - Complete Level 4 (Walks with assist in room) - Balance while marching in place and cannot step forward and back - Complete Level 1 (Bedfast) - Unable to balance while sitting on edge of bed              Barriers to discharge:  Disposition Plan:  pending clinical course Status is: Inpt  Objective: Blood pressure 120/67, pulse 76, temperature (!) 97.4 F (36.3 C), temperature source Temporal, resp. rate 14, height 6' 0.5" (1.842 m), weight 84.4 kg, SpO2 100 %.  Examination:  Physical Exam Constitutional:      General: He is not in acute distress.    Appearance: Normal appearance.  HENT:     Head: Normocephalic and atraumatic.     Mouth/Throat:     Mouth: Mucous membranes are moist.  Eyes:     Extraocular Movements: Extraocular movements intact.     Comments: Mild left ptosis  Cardiovascular:     Rate and Rhythm: Normal rate and regular rhythm.     Heart sounds: Normal heart sounds.  Pulmonary:     Effort: Pulmonary effort is normal. No respiratory distress.     Breath sounds: Rhonchi present. No wheezing.  Abdominal:     General: Bowel sounds are normal. There is no distension.  Palpations: Abdomen is soft.     Tenderness: There is no abdominal tenderness.  Musculoskeletal:     Cervical back: Normal range of motion and neck supple.     Comments: Trace L>R LE edema  Skin:    General: Skin is warm and dry.  Neurological:     Mental Status: He is alert.     Comments: Generalized weakness, worse in LE. Oriented to name, year, president, place   Psychiatric:        Mood and Affect: Mood normal.        Behavior: Behavior normal.      Consultants:  PCCM Urology GI Neurosurgery - signed off  Procedures:  10/13: EGD/colonoscopy  Data Reviewed: Results for orders placed or performed  during the hospital encounter of 04/11/22 (from the past 24 hour(s))  Comprehensive metabolic panel     Status: Abnormal   Collection Time: 04/18/22  6:06 PM  Result Value Ref Range   Sodium 137 135 - 145 mmol/L   Potassium 4.3 3.5 - 5.1 mmol/L   Chloride 98 98 - 111 mmol/L   CO2 35 (H) 22 - 32 mmol/L   Glucose, Bld 151 (H) 70 - 99 mg/dL   BUN 42 (H) 8 - 23 mg/dL   Creatinine, Ser 1.28 (H) 0.61 - 1.24 mg/dL   Calcium 11.3 (H) 8.9 - 10.3 mg/dL   Total Protein 6.5 6.5 - 8.1 g/dL   Albumin 2.7 (L) 3.5 - 5.0 g/dL   AST 18 15 - 41 U/L   ALT 18 0 - 44 U/L   Alkaline Phosphatase 67 38 - 126 U/L   Total Bilirubin 0.6 0.3 - 1.2 mg/dL   GFR, Estimated 54 (L) >60 mL/min   Anion gap 4 (L) 5 - 15  Protime-INR     Status: Abnormal   Collection Time: 04/19/22  2:37 AM  Result Value Ref Range   Prothrombin Time 15.4 (H) 11.4 - 15.2 seconds   INR 1.2 0.8 - 1.2  CBC with Differential/Platelet     Status: Abnormal   Collection Time: 04/19/22  2:37 AM  Result Value Ref Range   WBC 9.6 4.0 - 10.5 K/uL   RBC 2.76 (L) 4.22 - 5.81 MIL/uL   Hemoglobin 7.9 (L) 13.0 - 17.0 g/dL   HCT 26.3 (L) 39.0 - 52.0 %   MCV 95.3 80.0 - 100.0 fL   MCH 28.6 26.0 - 34.0 pg   MCHC 30.0 30.0 - 36.0 g/dL   RDW 14.7 11.5 - 15.5 %   Platelets 217 150 - 400 K/uL   nRBC 0.0 0.0 - 0.2 %   Neutrophils Relative % 80 %   Neutro Abs 7.8 (H) 1.7 - 7.7 K/uL   Lymphocytes Relative 12 %   Lymphs Abs 1.1 0.7 - 4.0 K/uL   Monocytes Relative 6 %   Monocytes Absolute 0.6 0.1 - 1.0 K/uL   Eosinophils Relative 1 %   Eosinophils Absolute 0.1 0.0 - 0.5 K/uL   Basophils Relative 0 %   Basophils Absolute 0.0 0.0 - 0.1 K/uL   Immature Granulocytes 1 %   Abs Immature Granulocytes 0.06 0.00 - 0.07 K/uL  Comprehensive metabolic panel     Status: Abnormal   Collection Time: 04/19/22  2:37 AM  Result Value Ref Range   Sodium 142 135 - 145 mmol/L   Potassium 3.9 3.5 - 5.1 mmol/L   Chloride 101 98 - 111 mmol/L   CO2 33 (H) 22 - 32  mmol/L  Glucose, Bld 90 70 - 99 mg/dL   BUN 38 (H) 8 - 23 mg/dL   Creatinine, Ser 1.19 0.61 - 1.24 mg/dL   Calcium 11.2 (H) 8.9 - 10.3 mg/dL   Total Protein 6.1 (L) 6.5 - 8.1 g/dL   Albumin 2.5 (L) 3.5 - 5.0 g/dL   AST 17 15 - 41 U/L   ALT 18 0 - 44 U/L   Alkaline Phosphatase 60 38 - 126 U/L   Total Bilirubin 0.7 0.3 - 1.2 mg/dL   GFR, Estimated 59 (L) >60 mL/min   Anion gap 8 5 - 15  Magnesium     Status: None   Collection Time: 04/19/22  2:37 AM  Result Value Ref Range   Magnesium 2.0 1.7 - 2.4 mg/dL  Blood gas, arterial     Status: Abnormal   Collection Time: 04/19/22  4:40 AM  Result Value Ref Range   FIO2 30.00 %   Delivery systems BILEVEL POSITIVE AIRWAY PRESSURE    Mode BILEVEL POSITIVE AIRWAY PRESSURE    Inspiratory PAP 18 cmH2O   Expiratory PAP 8 cmH2O   pH, Arterial 7.44 7.35 - 7.45   pCO2 arterial 57 (H) 32 - 48 mmHg   pO2, Arterial 108 83 - 108 mmHg   Bicarbonate 38.5 (H) 20.0 - 28.0 mmol/L   Acid-Base Excess 11.8 (H) 0.0 - 2.0 mmol/L   O2 Saturation 100 %   Patient temperature 36.5    Collection site RIGHT RADIAL    Drawn by [id number]    Allens test (pass/fail) PASS PASS    I have Reviewed nursing notes, Vitals, and Lab results since pt's last encounter. Pertinent lab results : see above I have ordered test including BMP, CBC, Mg I have reviewed the last note from staff over past 24 hours I have discussed pt's care plan and test results with nursing staff, case manager   LOS: 7 days   Dwyane Dee, MD [facility] 04/19/2022, 2:13 PM

## 2022-04-19 NOTE — Progress Notes (Signed)
NIF greater than -40 great effort,

## 2022-04-19 NOTE — Op Note (Signed)
[facility] Patient Name: Daniel Sharp Procedure Date: 04/19/2022 MRN: [id number] Attending MD: Justice Britain , MD Date of Birth: 1933/05/08 CSN: [id number] Age: 88 Admit Type: Inpatient Procedure:                Upper GI endoscopy Indications:              Iron deficiency anemia Providers:                Justice Britain, MD, Carlyn Reichert, RN, William Dalton, Technician, Arnoldo Hooker, CRNA Referring MD:             Thornton Park MD, Inpatient Medical Team Medicines:                Monitored Anesthesia Care Complications:            No immediate complications. Estimated Blood Loss:     Estimated blood loss was minimal. Procedure:                Pre-Anesthesia Assessment:                           - Prior to the procedure, a History and Physical                            was performed, and patient medications and                            allergies were reviewed. The patient's tolerance of                            previous anesthesia was also reviewed. The risks                            and benefits of the procedure and the sedation                            options and risks were discussed with the patient.                            All questions were answered, and informed consent                            was obtained. Prior Anticoagulants: The patient has                            taken Coumadin (warfarin), last dose was 5 days                            prior to procedure. ASA Grade Assessment: III - A                            patient with severe systemic disease. After  reviewing the risks and benefits, the patient was                            deemed in satisfactory condition to undergo the                            procedure.                           After obtaining informed consent, the endoscope was                            passed under direct vision. Throughout the                             procedure, the patient's blood pressure, pulse, and                            oxygen saturations were monitored continuously. The                            [id number]-H190 ([id number]) Olympus endoscope was introduced                            through the mouth, and advanced to the second part                            of duodenum. The upper GI endoscopy was                            accomplished without difficulty. The patient                            tolerated the procedure. Scope In: Scope Out: Findings:      No gross lesions were noted in the entire esophagus.      A widely patent Schatzki ring was found at the gastroesophageal junction.      The Z-line was regular and was found 43 cm from the incisors.      A 3 cm hiatal hernia was present.      Patchy mildly erythematous mucosa without bleeding was found in the       gastric antrum.      No other gross lesions were noted in the entire examined stomach.       Biopsies were taken with a cold forceps for histology and Helicobacter       pylori testing.      No gross lesions were noted in the duodenal bulb, in the first portion       of the duodenum and in the second portion of the duodenum. Biopsies were       taken with a cold forceps for histology and Celiac rule out. Impression:               - No gross lesions in esophagus. Widely patent  Schatzki ring. Z-line regular, 43 cm from the                            incisors.                           - 3 cm hiatal hernia.                           - Erythematous mucosa in the antrum. No other gross                            lesions in the stomach. Biopsied.                           - No gross lesions in the duodenal bulb, in the                            first portion of the duodenum and in the second                            portion of the duodenum. Biopsied. Moderate Sedation:      Not Applicable - Patient had care per  Anesthesia. Recommendation:           - Proceed to scheduled colonoscopy.                           - Continue present medications.                           - Await pathology results.                           - The findings and recommendations were discussed                            with the patient.                           - The findings and recommendations were discussed                            with the patient's family.                           - The findings and recommendations were discussed                            with the referring physician. Procedure Code(s):        --- Professional ---                           [phone], Esophagogastroduodenoscopy, flexible,                            transoral; with biopsy, single or multiple Diagnosis Code(s):        ---  Professional ---                           K22.2, Esophageal obstruction                           K44.9, Diaphragmatic hernia without obstruction or                            gangrene                           K31.89, Other diseases of stomach and duodenum                           D50.9, Iron deficiency anemia, unspecified CPT copyright 2019 [employer]. All rights reserved. The codes documented in this report are preliminary and upon coder review may  be revised to meet current compliance requirements. Justice Britain, MD 04/19/2022 2:02:13 PM Number of Addenda: 0

## 2022-04-19 NOTE — Progress Notes (Signed)
NIF  greater than -40 great effort

## 2022-04-19 NOTE — Progress Notes (Addendum)
NIF not completed at this time. Pt is sleeping. Family is at bedside says he is resting.Pt did greater than -40 both times today on NIF.

## 2022-04-20 ENCOUNTER — Encounter (HOSPITAL_COMMUNITY): Payer: Self-pay | Admitting: Gastroenterology

## 2022-04-20 ENCOUNTER — Other Ambulatory Visit: Payer: Self-pay | Admitting: Family Medicine

## 2022-04-20 DIAGNOSIS — Z86010 Personal history of colonic polyps: Secondary | ICD-10-CM

## 2022-04-20 DIAGNOSIS — G9341 Metabolic encephalopathy: Secondary | ICD-10-CM | POA: Diagnosis not present

## 2022-04-20 DIAGNOSIS — K6289 Other specified diseases of anus and rectum: Secondary | ICD-10-CM | POA: Diagnosis not present

## 2022-04-20 DIAGNOSIS — D509 Iron deficiency anemia, unspecified: Secondary | ICD-10-CM | POA: Diagnosis not present

## 2022-04-20 DIAGNOSIS — J9601 Acute respiratory failure with hypoxia: Secondary | ICD-10-CM | POA: Diagnosis not present

## 2022-04-20 DIAGNOSIS — Z7901 Long term (current) use of anticoagulants: Secondary | ICD-10-CM | POA: Diagnosis not present

## 2022-04-20 DIAGNOSIS — R310 Gross hematuria: Secondary | ICD-10-CM | POA: Diagnosis not present

## 2022-04-20 LAB — COMPREHENSIVE METABOLIC PANEL
ALT: 16 U/L (ref 0–44)
AST: 15 U/L (ref 15–41)
Albumin: 2.4 g/dL — ABNORMAL LOW (ref 3.5–5.0)
Alkaline Phosphatase: 59 U/L (ref 38–126)
Anion gap: 3 — ABNORMAL LOW (ref 5–15)
BUN: 42 mg/dL — ABNORMAL HIGH (ref 8–23)
CO2: 34 mmol/L — ABNORMAL HIGH (ref 22–32)
Calcium: 11 mg/dL — ABNORMAL HIGH (ref 8.9–10.3)
Chloride: 103 mmol/L (ref 98–111)
Creatinine, Ser: 1.29 mg/dL — ABNORMAL HIGH (ref 0.61–1.24)
GFR, Estimated: 54 mL/min — ABNORMAL LOW (ref >60)
Glucose, Bld: 100 mg/dL — ABNORMAL HIGH (ref 70–99)
Potassium: 4 mmol/L (ref 3.5–5.1)
Sodium: 140 mmol/L (ref 135–145)
Total Bilirubin: 0.9 mg/dL (ref 0.3–1.2)
Total Protein: 6 g/dL — ABNORMAL LOW (ref 6.5–8.1)

## 2022-04-20 LAB — CBC WITH DIFFERENTIAL/PLATELET
Abs Immature Granulocytes: 0.04 10*3/uL (ref 0.00–0.07)
Basophils Absolute: 0 10*3/uL (ref 0.0–0.1)
Basophils Relative: 0 %
Eosinophils Absolute: 0 10*3/uL (ref 0.0–0.5)
Eosinophils Relative: 0 %
HCT: 23.6 % — ABNORMAL LOW (ref 39.0–52.0)
Hemoglobin: 7 g/dL — ABNORMAL LOW (ref 13.0–17.0)
Immature Granulocytes: 1 %
Lymphocytes Relative: 12 %
Lymphs Abs: 1 10*3/uL (ref 0.7–4.0)
MCH: 28.7 pg (ref 26.0–34.0)
MCHC: 29.7 g/dL — ABNORMAL LOW (ref 30.0–36.0)
MCV: 96.7 fL (ref 80.0–100.0)
Monocytes Absolute: 0.5 10*3/uL (ref 0.1–1.0)
Monocytes Relative: 6 %
Neutro Abs: 6.9 10*3/uL (ref 1.7–7.7)
Neutrophils Relative %: 81 %
Platelets: 191 10*3/uL (ref 150–400)
RBC: 2.44 MIL/uL — ABNORMAL LOW (ref 4.22–5.81)
RDW: 15 % (ref 11.5–15.5)
WBC: 8.5 10*3/uL (ref 4.0–10.5)
nRBC: 0 % (ref 0.0–0.2)

## 2022-04-20 LAB — PREPARE RBC (CROSSMATCH)

## 2022-04-20 LAB — HEMOGLOBIN AND HEMATOCRIT, BLOOD
HCT: 27 % — ABNORMAL LOW (ref 39.0–52.0)
Hemoglobin: 8.2 g/dL — ABNORMAL LOW (ref 13.0–17.0)

## 2022-04-20 LAB — PROTIME-INR
INR: 1.2 (ref 0.8–1.2)
Prothrombin Time: 14.6 seconds (ref 11.4–15.2)

## 2022-04-20 LAB — MAGNESIUM: Magnesium: 2.2 mg/dL (ref 1.7–2.4)

## 2022-04-20 MED ORDER — SODIUM CHLORIDE 0.9% IV SOLUTION: Freq: Once | INTRAVENOUS | Status: AC

## 2022-04-20 MED ORDER — SPOT INK MARKER SYRINGE KIT
PACK | SUBMUCOSAL | Status: AC
  Filled 2022-04-20: qty 5

## 2022-04-20 MED ORDER — PANTOPRAZOLE SODIUM 40 MG PO TBEC
40.0000 mg | DELAYED_RELEASE_TABLET | Freq: Two times a day (BID) | ORAL | Status: DC
  Administered 2022-04-20 – 2022-04-30 (×21): 40 mg via ORAL
  Filled 2022-04-20 (×21): qty 1

## 2022-04-20 NOTE — Progress Notes (Signed)
FVC 1.6 L NIF -40 great effort. Pt off bipap on 2L [place]

## 2022-04-20 NOTE — Progress Notes (Signed)
   Dw Dwyane Dee, MD - ccm can hold off on rounding next 2 days. Patient more stable. Will move to floor/progressive. Has refused BiPAP. Reasess on 10/16 or 04/13/22     SIGNATURE    Dr. Brand Males, M.D., F.C.C.P,  Pulmonary and Critical Care Medicine Staff Physician, [place] Director - Interstitial Lung Disease  Program  Medical Director - [facility] [place] [facility] [place], [place], [address]   Pager: [phone], If no answer  -[place] or [place] [phone] Telephone (clinical office): [phone] Telephone (research): [phone]  10:25 AM 04/20/2022

## 2022-04-20 NOTE — Progress Notes (Signed)
Pt was found off bipap and RN aware. Pt doesn't want to go back on it. Machine remained bedside.

## 2022-04-20 NOTE — Progress Notes (Signed)
Progress Note    Daniel Sharp   VCB:[id number]  DOB: 1933/07/02  DOA: 04/11/2022     8 PCP: Daniel Crook, MD  Initial CC: hematuria, melanotic stools, weakness  Hospital Course: 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  He was admitted on 04/11/22 with melanotic stools and gross hematuria.  He had been undergoing outpatient GI evaluation for EGD and colonoscopy. After admission he became grossly weaker with worsened respiratory status as well.  He was found to have hypercarbic respiratory failure and transferred to the ICU.  Pulmonology and neurology were also consulted.  He was placed on BiPAP with improvement in his mentation and respiratory status.  He was also noted to have volume overload and was started on diuresis, again with good response. He was not felt to warrant IVIG. He underwent brain imaging and was found to have [place], dural based mass in the right frontal lobe consistent with typical meningioma, and multiple small microhemorrhages consistent with cerebral amyloid angiopathy considered mild in severity. He was evaluated by neurosurgery due to these findings and recommended to have no further intervention or work-up for the meningioma/dural based mass.  SAH was also considered stable.  Interval History:  No events overnight.  Son present bedside this morning.  He understands very well the findings from the scopes yesterday including concern for rectal malignancy.  We also discussed these with the patient himself as he was still coming off of sedation yesterday when findings explained.  Patient understands. For now, we will work on transferring out of ICU, starting to engage physical therapy, and discussing next steps with surgery.  Assessment and Plan:  GIB Chronic IDA - on oral iron at home - low iron  stores on iron studies on 9/20 and 10/6 - s/p 2 units PRBC on 04/09/22 (1 unit on 9/26) - appreciate GI assistance; underwent EGD/colonoscopy on 10/13 - Infed to be given 10/15 - Hgb 7 g/dL this morning; will give 1 unit PRBC today, 10/14 - continue to hold anticoagulation (see rectal mass)  Rectal mass -Colonoscopy performed 04/19/2022; polypoid and ulcerated nonobstructing medium-sized mass found in the distal rectum and extends to and involves the dentate line.  Mass partially circumferential and measuring at least 3 cm in length.  Oozing was noted to be present and evidence of 2 areas of clot.  This was felt to be the culprit for his rectal bleeding and will have ongoing risk for further bleeding until definitively treated.  Therefore in setting of ongoing anemia requiring blood transfusions, anticoagulation must continue to be held -Follow-up biopsy results - will discuss with surgery; patient still may need pelvic MRI per GI  Acute hypoxic hypercarbic respiratory failure -Presumed multifactorial in setting of volume overload and hypercarbia.  There was some initial concern for contribution from myasthenia gravis as well. -Patient responded well to diuresis and BiPAP use - appreciate pulmonology and neuro insight; patient considered appropriate for EGD/colonoscopy; if were to decompensate then intubation would be recommended (he appears to be steadily improving and O2 requirement is minimal ~2L).  - tolerated anesthesia well for EGD and c-scope on 38/46  Acute metabolic encephalopathy  - seems labile at times  - due to hypercarbia and deconditioning; some component of hospital delirium and cannot fully rule out hypercalcemia playing a factor - now improved s/p BiPAP use - repeat  ABG if further lethargy/somnolence  - Given good clinical response in general over the past 24 hours, hold off on palliative care consult at this time - see hypercalcemia  Hypercalcemia - some possible  contribution to mentation from hyperCa - s/p zometa and calcitonin with minimal improvement - mentation stable and no other worriesome features - monitoring Ca level further for now (PTH level normal)  CKD3a - patient has history of CKD3a. Baseline creat ~ 1.3 - 1.6 - possible contribution from L nephrectomy as well - at risk for ATN with hypoxia  - continue trending renal function   Hematuria - resolved  BPH with LUTS RCC s/p left nephrectomy 01/22/22 - Appreciate urology evaluation - Some suspicion that his hematuria may be due to prostatic origin per urology - Continue tamsulosin - Finasteride added per urology - No further hematuria since 04/11/2022.  If develops again, will reconsult urology  Acute on chronic dCHF -New echo during hospitalization; EF 55%, no RWMA; severe concentric LVH.  Indeterminate diastolic parameters.  Mild enlarged right ventricle -BNP 313; pulmonary crackles, respiratory distress/dyspnea/SOB, right pleural effusion - good response with lasix which is now on hold   Ocular myasthenia gravis - neurology following as well - continue NIFs as per neuro (change to TID) - continue prednisone and mestinon  -Caution with medications that can exacerbate - Neurology recommends elective intubation if were to worsen  SAH - stable Meningioma  -Appreciate neurosurgery input.  SAH stable and there are multiple small "microhemorrhages". This has been discussed with neurology (Dr. Curly Shores). The microhemorrhages are felt moreso related to long term anticoagulation since 2007 and less likely from [place] (as originally proposed from [place]). He is felt to NOT have contraindication to anticoagulation in this context   Culture-negative sepsis Possible hemorrhagic cystitis  Concern for HAP - initially treated with cefepime; discontinued after discussion with neurology as could also be playing a factor in mentation  - now afebrile and mild leukocytosis has normalized - cefepime  discontinued on 10/11; keep off abx; discussed with PCCM  NSVT - asymptomatic - monitor electrolytes   PAF Hx MV repair - on chronic Coumadin -Anticoagulation currently on hold for above work-ups to be completed  HTN - continue current treatment  Old records reviewed in assessment of this patient  Antimicrobials: Rocephin 10/5 >>10/9 Cefepime 10/10 >> 10/11   DVT prophylaxis:  SCDs Start: 04/11/22 2325   Code Status:   Code Status: Partial Code  Mobility Assessment (last 72 hours)     Mobility Assessment   No documentation.           Barriers to discharge:  Disposition Plan:  pending clinical course Status is: Inpt  Objective: Blood pressure 130/61, pulse 70, temperature 98.3 F (36.8 C), temperature source Oral, resp. rate 20, height 6' 0.5" (1.842 m), weight 83 kg, SpO2 100 %.  Examination:  Physical Exam Constitutional:      General: He is not in acute distress.    Appearance: Normal appearance.  HENT:     Head: Normocephalic and atraumatic.     Mouth/Throat:     Mouth: Mucous membranes are moist.  Eyes:     Extraocular Movements: Extraocular movements intact.     Comments: Mild left ptosis  Cardiovascular:     Rate and Rhythm: Normal rate and regular rhythm.     Heart sounds: Normal heart sounds.  Pulmonary:     Effort: Pulmonary effort is normal. No respiratory distress.     Breath sounds: No wheezing.  Abdominal:     General: Bowel sounds are normal. There is no distension.     Palpations: Abdomen is soft.     Tenderness: There is no abdominal tenderness.  Musculoskeletal:     Cervical back: Normal range of motion and neck supple.     Comments: Trace L>R LE edema  Skin:    General: Skin is warm and dry.  Neurological:     Mental Status: He is alert.     Comments: Generalized weakness, worse in LE. Oriented to name, year, president, place   Psychiatric:        Mood and Affect: Mood normal.        Behavior: Behavior normal.       Consultants:  PCCM Urology GI Neurosurgery - signed off  Procedures:  10/13: EGD/colonoscopy  Data Reviewed: Results for orders placed or performed during the hospital encounter of 04/11/22 (from the past 24 hour(s))  Protime-INR     Status: None   Collection Time: 04/20/22  2:48 AM  Result Value Ref Range   Prothrombin Time 14.6 11.4 - 15.2 seconds   INR 1.2 0.8 - 1.2  CBC with Differential/Platelet     Status: Abnormal   Collection Time: 04/20/22  2:48 AM  Result Value Ref Range   WBC 8.5 4.0 - 10.5 K/uL   RBC 2.44 (L) 4.22 - 5.81 MIL/uL   Hemoglobin 7.0 (L) 13.0 - 17.0 g/dL   HCT 23.6 (L) 39.0 - 52.0 %   MCV 96.7 80.0 - 100.0 fL   MCH 28.7 26.0 - 34.0 pg   MCHC 29.7 (L) 30.0 - 36.0 g/dL   RDW 15.0 11.5 - 15.5 %   Platelets 191 150 - 400 K/uL   nRBC 0.0 0.0 - 0.2 %   Neutrophils Relative % 81 %   Neutro Abs 6.9 1.7 - 7.7 K/uL   Lymphocytes Relative 12 %   Lymphs Abs 1.0 0.7 - 4.0 K/uL   Monocytes Relative 6 %   Monocytes Absolute 0.5 0.1 - 1.0 K/uL   Eosinophils Relative 0 %   Eosinophils Absolute 0.0 0.0 - 0.5 K/uL   Basophils Relative 0 %   Basophils Absolute 0.0 0.0 - 0.1 K/uL   Immature Granulocytes 1 %   Abs Immature Granulocytes 0.04 0.00 - 0.07 K/uL  Comprehensive metabolic panel     Status: Abnormal   Collection Time: 04/20/22  2:48 AM  Result Value Ref Range   Sodium 140 135 - 145 mmol/L   Potassium 4.0 3.5 - 5.1 mmol/L   Chloride 103 98 - 111 mmol/L   CO2 34 (H) 22 - 32 mmol/L   Glucose, Bld 100 (H) 70 - 99 mg/dL   BUN 42 (H) 8 - 23 mg/dL   Creatinine, Ser 1.29 (H) 0.61 - 1.24 mg/dL   Calcium 11.0 (H) 8.9 - 10.3 mg/dL   Total Protein 6.0 (L) 6.5 - 8.1 g/dL   Albumin 2.4 (L) 3.5 - 5.0 g/dL   AST 15 15 - 41 U/L   ALT 16 0 - 44 U/L   Alkaline Phosphatase 59 38 - 126 U/L   Total Bilirubin 0.9 0.3 - 1.2 mg/dL   GFR, Estimated 54 (L) >60 mL/min   Anion gap 3 (L) 5 - 15  Magnesium     Status: None   Collection Time: 04/20/22  2:48 AM  Result  Value Ref Range   Magnesium 2.2 1.7 - 2.4 mg/dL  Type and screen [place]  Status: None (Preliminary result)   Collection Time: 04/20/22  8:37 AM  Result Value Ref Range   ABO/RH(D) A POS    Antibody Screen NEG    Sample Expiration 04/23/2022,2359    Unit Number [id number]    Blood Component Type RBC LR PHER2    Unit division 00    Status of Unit ISSUED    Transfusion Status OK TO TRANSFUSE    Crossmatch Result      Compatible Performed at [facility] [address] [place], [place] [address]   Prepare RBC (crossmatch)     Status: None   Collection Time: 04/20/22  8:37 AM  Result Value Ref Range   Order Confirmation      ORDER PROCESSED BY BLOOD BANK Performed at [facility], [place] [address], [place], [place] [address]     I have Reviewed nursing notes, Vitals, and Lab results since pt's last encounter. Pertinent lab results : see above I have ordered test including BMP, CBC, Mg I have reviewed the last note from staff over past 24 hours I have discussed pt's care plan and test results with nursing staff, case manager   LOS: 8 days   Dwyane Dee, MD [facility] 04/20/2022, 3:29 PM

## 2022-04-20 NOTE — Progress Notes (Signed)
Gastroenterology Inpatient Follow-up Note   PATIENT IDENTIFICATION  Daniel Sharp is a 88 y.o. male Hospital Day: 10  SUBJECTIVE  The patient's chart was reviewed. The patient's labs were reviewed. The patient is evaluated with his son at bedside. Patient is receiving a packed RBC transfusion today. Patient and son are watching the [employer] [place] football game at this time unfortunately they are losing 7-0 but they are hopeful that the [facility] will win. The patient denies fevers or chills. The patient denies any abdominal pain or discomfort. Overt GI bleeding has not been noted.   OBJECTIVE  Scheduled Inpatient Medications:   Chlorhexidine Gluconate Cloth  6 each Topical Daily   finasteride  5 mg Oral Daily   mouth rinse  15 mL Mouth Rinse 4 times per day   pantoprazole  40 mg Oral BID   predniSONE  10 mg Oral Daily   pyridostigmine  2 mg Intravenous QID   Or   pyridostigmine  60 mg Oral QID   tamsulosin  0.4 mg Oral Daily   Continuous Inpatient Infusions:  PRN Inpatient Medications: acetaminophen **OR** acetaminophen, mouth rinse   Physical Examination  Temp:  [97.2 F (36.2 C)-98.6 F (37 C)] 98 F (36.7 C) (10/14 1149) Pulse Rate:  [52-117] 69 (10/14 1149) Resp:  [13-23] 19 (10/14 1149) BP: (110-150)/(41-97) 118/64 (10/14 1149) SpO2:  [97 %-100 %] 99 % (10/14 1149) Weight:  [83 kg] 83 kg (10/14 0500) Temp (24hrs), Avg:98 F (36.7 C), Min:97.2 F (36.2 C), Max:98.6 F (37 C)  Weight: 83 kg GEN: NAD, chronically ill-appearing, much older than stated age, nontoxic PSYCH: Cooperative EYE: Conjunctivae pale-pink ENT: MMM CV: Nontachycardic RESP: No audible wheezing GI: NABS, soft, protuberant abdomen, rounded, nontender, no rebound MSK/EXT: Lower extremity edema present SKIN: No jaundice NEURO:  Alert & Oriented x 2 (person/place)   Review of Data   Laboratory Studies   Recent Labs  Lab 04/16/22 0239 04/17/22 0257 04/17/22 1203  04/18/22 0257 04/18/22 1026 04/20/22 0248  NA 136 137   < > 138   < > 140  K 4.9 4.0   < > 4.1   < > 4.0  CL 99 97*   < > 96*   < > 103  CO2 33* 32   < > 34*   < > 34*  BUN 39* 41*   < > 46*   < > 42*  CREATININE 1.20 1.23   < > 1.41*   < > 1.29*  GLUCOSE 108* 83   < > 91   < > 100*  CALCIUM 11.8* 11.8*   < > 11.6*   < > 11.0*  MG 2.2 2.1  --  2.0   < > 2.2  PHOS 3.3 2.8  --  2.7  --   --    < > = values in this interval not displayed.   Recent Labs  Lab 04/20/22 0248  AST 15  ALT 16  ALKPHOS 59    Recent Labs  Lab 04/18/22 0257 04/19/22 0237 04/20/22 0248  WBC 10.2 9.6 8.5  HGB 8.2* 7.9* 7.0*  HCT 26.5* 26.3* 23.6*  PLT 213 217 191   Recent Labs  Lab 04/18/22 0257 04/19/22 0237 04/20/22 0248  INR 1.2 1.2 1.2    Imaging Studies  DG CHEST PORT 1 VIEW  Result Date: 04/19/2022 CLINICAL DATA:  Acute hypoxic respiratory failure. EXAM: PORTABLE CHEST 1 VIEW COMPARISON:  One-view chest x-ray 04/17/2022. CT of the chest 04/16/2022. FINDINGS: The  heart is enlarged. Aeration of both lungs continues to improve. Interstitial and airspace opacities remain worse right than left. Effusions are significantly improved. IMPRESSION: 1. Improving aeration and effusions. 2. Persistent interstitial and airspace opacities remain worse right than left. Electronically Signed   By: San Morelle M.D.   On: 04/19/2022 06:59    GI Procedures and Studies  EGD - No gross lesions in esophagus. Widely patent Schatzki ring. Z-line regular, 43 cm from the incisors. - 3 cm hiatal hernia. - Erythematous mucosa in the antrum. No other gross lesions in the stomach. Biopsied. - No gross lesions in the duodenal bulb, in the first portion of the duodenum and in the second portion of the duodenum. Biopsied.  Colonoscopy - Palpable rectal mass and hemorrhoids found on digital rectal exam. - Stool in the entire examined colon - lavaged with adequate visualization. - The examined portion of the  ileum was normal. - 12, 3 to 10 mm polyps in the rectum, in the descending colon, in the transverse colon, in the ascending colon and in the cecum, removed with a cold snare. Resected and retrieved. - One 35 mm polyp in the proximal descending colon, removed with piecemeal mucosal resection. Resected and retrieved. Treated with STSC. Clips (MR conditional) were placed. Tattooed. - Rule out malignancy, tumor in the distal rectum involving the dentate line. Biopsied. - Diverticulosis of the sigmoid and rectosigmoid colon. - Non-bleeding non-thrombosed external and internal hemorrhoids.   ASSESSMENT  Daniel Sharp is a 88 y.o. male with a pmh significant for myasthenia gravis, atrial fibrillation (on anticoagulation), heart failure, hypertension, chronic anemia, status post nephrectomy, CVA [facility]) admitted with hematuria and gastrointestinal bleeding with anemia.  Patient is hemodynamically stable at this time.  I think the patient would benefit from intravenous iron and I think having that over the course the next couple days would be helpful.  Unfortunately his presentation is most likely a result of the rectal malignancy.  Pathology is pending.  Hopefully that will be back on Monday or Tuesday.  Anticoagulation previously discussed with medicine service and defer to them other than what I have recommended in my previous colonoscopy report.  Agree with transfusion for now as well.  Pending pathology patient may need oncology/surgical oncology/radiation oncology, the lesion that is in the rectum will not be able to be removed endoscopically.  The inpatient GI service will move to standby at this time but please call with questions and you will be updated when the pathology returns.   PLAN/RECOMMENDATIONS  Pathology pending Recommend medicine consider single dose IV iron infusion later today or tomorrow after his blood transfusion Anticoagulation as per primary service (see colonoscopy report for thoughts  on initiation if necessary) If rectal malignancy is confirmed, patient will need a pelvic MRI and further discussions with oncology/surgical oncology/radiation oncology The GI service will move to standby at this time but if other issues arise please let us know we will be happy to evaluate the patient and we will let the team know when the pathology arrives early next week.     Please page/call with questions or concerns.   Justice Britain, MD [facility] Office # [phone]    LOS: 8 days  Irving Copas  04/20/2022, 12:17 PM

## 2022-04-20 NOTE — Progress Notes (Signed)
NIF -40 , FVC , 800CC

## 2022-04-20 NOTE — Progress Notes (Signed)
NIF -22 pt sleepy  FVC 1.9 good effort 2L

## 2022-04-20 NOTE — TOC Progression Note (Signed)
Transition of Care [facility]) - Progression Note    Patient Details  Name: Daniel Sharp MRN: [id number] Date of Birth: Apr 26, 1933  Transition of [facility]) CM/SW Contact  Henrietta Dine, RN Phone Number: 04/20/2022, 4:12 PM  Clinical Narrative:    Pt not ready for d/c; awaiting disposition; TOC will con't to follow.   Expected Discharge Plan: [place] Barriers to Discharge: No Barriers Identified  Expected Discharge Plan and Services Expected Discharge Plan: [place] arrangements for the past 2 months: Single Family Home                                       Social Determinants of Health (SDOH) Interventions    Readmission Risk Interventions     No data to display

## 2022-04-21 ENCOUNTER — Inpatient Hospital Stay (HOSPITAL_COMMUNITY): Payer: Medicare HMO

## 2022-04-21 DIAGNOSIS — G9341 Metabolic encephalopathy: Secondary | ICD-10-CM | POA: Diagnosis not present

## 2022-04-21 DIAGNOSIS — K922 Gastrointestinal hemorrhage, unspecified: Secondary | ICD-10-CM | POA: Diagnosis not present

## 2022-04-21 DIAGNOSIS — J9601 Acute respiratory failure with hypoxia: Secondary | ICD-10-CM | POA: Diagnosis not present

## 2022-04-21 DIAGNOSIS — R310 Gross hematuria: Secondary | ICD-10-CM | POA: Diagnosis not present

## 2022-04-21 LAB — CBC WITH DIFFERENTIAL/PLATELET
Abs Immature Granulocytes: 0.07 10*3/uL (ref 0.00–0.07)
Basophils Absolute: 0 10*3/uL (ref 0.0–0.1)
Basophils Relative: 0 %
Eosinophils Absolute: 0.1 10*3/uL (ref 0.0–0.5)
Eosinophils Relative: 1 %
HCT: 25.6 % — ABNORMAL LOW (ref 39.0–52.0)
Hemoglobin: 7.7 g/dL — ABNORMAL LOW (ref 13.0–17.0)
Immature Granulocytes: 1 %
Lymphocytes Relative: 8 %
Lymphs Abs: 0.8 10*3/uL (ref 0.7–4.0)
MCH: 28.5 pg (ref 26.0–34.0)
MCHC: 30.1 g/dL (ref 30.0–36.0)
MCV: 94.8 fL (ref 80.0–100.0)
Monocytes Absolute: 0.5 10*3/uL (ref 0.1–1.0)
Monocytes Relative: 5 %
Neutro Abs: 8.5 10*3/uL — ABNORMAL HIGH (ref 1.7–7.7)
Neutrophils Relative %: 85 %
Platelets: 171 10*3/uL (ref 150–400)
RBC: 2.7 MIL/uL — ABNORMAL LOW (ref 4.22–5.81)
RDW: 15.2 % (ref 11.5–15.5)
WBC: 10 10*3/uL (ref 4.0–10.5)
nRBC: 0 % (ref 0.0–0.2)

## 2022-04-21 LAB — COMPREHENSIVE METABOLIC PANEL
ALT: 15 U/L (ref 0–44)
AST: 14 U/L — ABNORMAL LOW (ref 15–41)
Albumin: 2.5 g/dL — ABNORMAL LOW (ref 3.5–5.0)
Alkaline Phosphatase: 56 U/L (ref 38–126)
Anion gap: 4 — ABNORMAL LOW (ref 5–15)
BUN: 41 mg/dL — ABNORMAL HIGH (ref 8–23)
CO2: 33 mmol/L — ABNORMAL HIGH (ref 22–32)
Calcium: 10.4 mg/dL — ABNORMAL HIGH (ref 8.9–10.3)
Chloride: 102 mmol/L (ref 98–111)
Creatinine, Ser: 1.39 mg/dL — ABNORMAL HIGH (ref 0.61–1.24)
GFR, Estimated: 49 mL/min — ABNORMAL LOW (ref >60)
Glucose, Bld: 98 mg/dL (ref 70–99)
Potassium: 4.2 mmol/L (ref 3.5–5.1)
Sodium: 139 mmol/L (ref 135–145)
Total Bilirubin: 0.9 mg/dL (ref 0.3–1.2)
Total Protein: 6 g/dL — ABNORMAL LOW (ref 6.5–8.1)

## 2022-04-21 LAB — MAGNESIUM: Magnesium: 1.9 mg/dL (ref 1.7–2.4)

## 2022-04-21 LAB — PROTIME-INR
INR: 1.1 (ref 0.8–1.2)
Prothrombin Time: 14.3 seconds (ref 11.4–15.2)

## 2022-04-21 MED ORDER — SODIUM CHLORIDE 0.9 % IV SOLN
1000.0000 mg | Freq: Once | INTRAVENOUS | Status: AC
  Administered 2022-04-21: 1000 mg via INTRAVENOUS
  Filled 2022-04-21: qty 20

## 2022-04-21 MED ORDER — FLEET ENEMA 7-19 GM/118ML RE ENEM
1.0000 | ENEMA | Freq: Once | RECTAL | Status: AC
  Administered 2022-04-21: 1 via RECTAL
  Filled 2022-04-21: qty 1

## 2022-04-21 MED ORDER — SODIUM CHLORIDE 0.9 % IV SOLN
25.0000 mg | Freq: Once | INTRAVENOUS | Status: AC
  Administered 2022-04-21: 25 mg via INTRAVENOUS
  Filled 2022-04-21: qty 0.5

## 2022-04-21 NOTE — Progress Notes (Signed)
Pt removed hisself off the bipap, RT placed pt on 2lpm NIF -35

## 2022-04-21 NOTE — Progress Notes (Signed)
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  He was admitted on 04/11/22 with melanotic stools and gross hematuria.  He had been undergoing outpatient GI evaluation for EGD and colonoscopy. After admission he became grossly weaker with worsened respiratory status as well.  He was found to have hypercarbic respiratory failure and transferred to the ICU.  Pulmonology and neurology were also consulted.  He was placed on BiPAP with improvement in his mentation and respiratory status.  He was also noted to have volume overload and was started on diuresis, again with good response. He was not felt to warrant IVIG. He underwent brain imaging and was found to have [place], dural based mass in the right frontal lobe consistent with typical meningioma, and multiple small microhemorrhages consistent with cerebral amyloid angiopathy considered mild in severity. He was evaluated by neurosurgery due to these findings and recommended to have no further intervention or work-up for the meningioma/dural based mass.  SAH was also considered stable.  Interval History:  No events overnight.  Son present bedside this morning.  Patient still doing okay. No further blood per rectum as of yet nor hematuria.   Assessment and Plan:  GIB Chronic IDA - on oral iron at home - low iron stores on iron studies on 9/20 and 10/6 - s/p 2 units PRBC on 04/09/22 (1 unit on 9/26) - appreciate GI assistance; underwent EGD/colonoscopy on 10/13 - Infed be given 10/15 - Hgb 7 g/dL on 10/14, will give 1 unit PRBC, 10/14 - continue to hold anticoagulation (see rectal mass)  Rectal  mass -Colonoscopy performed 04/19/2022; polypoid and ulcerated nonobstructing medium-sized mass found in the distal rectum and extends to and involves the dentate line.  Mass partially circumferential and measuring at least 3 cm in length.  Oozing was noted to be present and evidence of 2 areas of clot.  This was felt to be the culprit for his rectal bleeding and will have ongoing risk for further bleeding until definitively treated.  Therefore in setting of ongoing anemia requiring blood transfusions, anticoagulation must continue to be held -Follow-up biopsy results - pelvic MRI ordered per GI rec's - surgery consulted to help facilitate next steps as well   Acute hypoxic hypercarbic respiratory failure -Presumed multifactorial in setting of volume overload and hypercarbia.  There was some initial concern for contribution from myasthenia gravis as well. -Patient responded well to diuresis and BiPAP use - appreciate pulmonology and neuro insight; patient considered appropriate for EGD/colonoscopy; if were to decompensate then intubation would be recommended (he appears to be steadily improving and O2 requirement is minimal ~2L).  - tolerated anesthesia well for EGD and c-scope on 89/37  Acute metabolic encephalopathy - resolved  - seems labile at times  - due to hypercarbia and deconditioning; some component of hospital delirium and cannot fully rule out hypercalcemia playing a factor - now improved s/p BiPAP use - repeat ABG if further lethargy/somnolence  - Given good clinical response in general, hold off on palliative care consult at this time - see hypercalcemia  Hypercalcemia - improved  - some possible contribution to mentation from hyperCa - s/p zometa  and calcitonin with improvement - mentation stable and no other worriesome features - monitoring Ca level further for now (PTH level normal)  CKD3a - patient has history of CKD3a. Baseline creat ~ 1.3 - 1.6 - possible contribution  from L nephrectomy as well - at risk for ATN with hypoxia  - continue trending renal function   Hematuria - resolved  BPH with LUTS RCC s/p left nephrectomy 01/22/22 - Appreciate urology evaluation - Some suspicion that his hematuria may be due to prostatic origin per urology - Continue tamsulosin - Finasteride added per urology - No further hematuria since 04/11/2022.  If develops again, will reconsult urology  Acute on chronic dCHF -New echo during hospitalization; EF 55%, no RWMA; severe concentric LVH.  Indeterminate diastolic parameters.  Mild enlarged right ventricle -BNP 313; pulmonary crackles, respiratory distress/dyspnea/SOB, right pleural effusion - good response with lasix which is now on hold   Ocular myasthenia gravis - neurology following as well - continue NIFs as per neuro (change to TID) - continue prednisone and mestinon  -Caution with medications that can exacerbate - Neurology recommends elective intubation if were to worsen  SAH - stable Meningioma  -Appreciate neurosurgery input.  SAH stable and there are multiple small "microhemorrhages". This has been discussed with neurology (Dr. Curly [place]). The microhemorrhages are felt moreso related to long term anticoagulation since 2007 and less likely from [place] (as originally proposed from [place]). He is felt to NOT have contraindication to anticoagulation in this context   Culture-negative sepsis Possible hemorrhagic cystitis  Concern for HAP - initially treated with cefepime; discontinued after discussion with neurology as could also be playing a factor in mentation  - now afebrile and mild leukocytosis has normalized - cefepime discontinued on 10/11; keep off abx; discussed with PCCM  NSVT - asymptomatic - monitor electrolytes   PAF Hx MV repair - on chronic Coumadin -Anticoagulation currently on hold for above work-ups to be completed  HTN - continue current treatment  Old records reviewed in assessment of  this patient  Antimicrobials: Rocephin 10/5 >>10/9 Cefepime 10/10 >> 10/11   DVT prophylaxis:  SCDs Start: 04/11/22 2325   Code Status:   Code Status: Partial Code  Mobility Assessment (last 72 hours)     Mobility Assessment     Row Name 04/21/22 0830 04/20/22 2051         Does patient have an order for bedrest or is patient medically unstable No - Continue assessment No - Continue assessment      What is the highest level of mobility based on the progressive mobility assessment? Level 4 (Walks with assist in room) - Balance while marching in place and cannot step forward and back - Complete --      Is the above level different from baseline mobility prior to current illness? Yes - Recommend PT order Yes - Recommend PT order               Barriers to discharge:  Disposition Plan:  pending clinical course Status is: Inpt  Objective: Blood pressure (!) 120/57, pulse 71, temperature 98.5 F (36.9 C), temperature source Oral, resp. rate 16, height 6' 0.5" (1.842 m), weight 83.4 kg, SpO2 99 %.  Examination:  Physical Exam Constitutional:      General: He is not in acute distress.    Appearance: Normal appearance.  HENT:     Head: Normocephalic and atraumatic.     Mouth/Throat:     Mouth: Mucous membranes are moist.  Eyes:  Extraocular Movements: Extraocular movements intact.     Comments: Mild left ptosis  Cardiovascular:     Rate and Rhythm: Normal rate and regular rhythm.     Heart sounds: Normal heart sounds.  Pulmonary:     Effort: Pulmonary effort is normal. No respiratory distress.     Breath sounds: No wheezing.  Abdominal:     General: Bowel sounds are normal. There is no distension.     Palpations: Abdomen is soft.     Tenderness: There is no abdominal tenderness.  Musculoskeletal:     Cervical back: Normal range of motion and neck supple.     Comments: Trace L>R LE edema  Skin:    General: Skin is warm and dry.  Neurological:     Mental Status:  He is alert.     Comments: Generalized weakness, worse in LE. Oriented to name, year, president, place   Psychiatric:        Mood and Affect: Mood normal.        Behavior: Behavior normal.      Consultants:  PCCM Urology GI Neurosurgery - signed off  Procedures:  10/13: EGD/colonoscopy  Data Reviewed: Results for orders placed or performed during the hospital encounter of 04/11/22 (from the past 24 hour(s))  Hemoglobin and hematocrit, blood     Status: Abnormal   Collection Time: 04/20/22  4:47 PM  Result Value Ref Range   Hemoglobin 8.2 (L) 13.0 - 17.0 g/dL   HCT 27.0 (L) 39.0 - 52.0 %  Protime-INR     Status: None   Collection Time: 04/21/22  5:02 AM  Result Value Ref Range   Prothrombin Time 14.3 11.4 - 15.2 seconds   INR 1.1 0.8 - 1.2  CBC with Differential/Platelet     Status: Abnormal   Collection Time: 04/21/22  5:02 AM  Result Value Ref Range   WBC 10.0 4.0 - 10.5 K/uL   RBC 2.70 (L) 4.22 - 5.81 MIL/uL   Hemoglobin 7.7 (L) 13.0 - 17.0 g/dL   HCT 25.6 (L) 39.0 - 52.0 %   MCV 94.8 80.0 - 100.0 fL   MCH 28.5 26.0 - 34.0 pg   MCHC 30.1 30.0 - 36.0 g/dL   RDW 15.2 11.5 - 15.5 %   Platelets 171 150 - 400 K/uL   nRBC 0.0 0.0 - 0.2 %   Neutrophils Relative % 85 %   Neutro Abs 8.5 (H) 1.7 - 7.7 K/uL   Lymphocytes Relative 8 %   Lymphs Abs 0.8 0.7 - 4.0 K/uL   Monocytes Relative 5 %   Monocytes Absolute 0.5 0.1 - 1.0 K/uL   Eosinophils Relative 1 %   Eosinophils Absolute 0.1 0.0 - 0.5 K/uL   Basophils Relative 0 %   Basophils Absolute 0.0 0.0 - 0.1 K/uL   Immature Granulocytes 1 %   Abs Immature Granulocytes 0.07 0.00 - 0.07 K/uL  Comprehensive metabolic panel     Status: Abnormal   Collection Time: 04/21/22  5:02 AM  Result Value Ref Range   Sodium 139 135 - 145 mmol/L   Potassium 4.2 3.5 - 5.1 mmol/L   Chloride 102 98 - 111 mmol/L   CO2 33 (H) 22 - 32 mmol/L   Glucose, Bld 98 70 - 99 mg/dL   BUN 41 (H) 8 - 23 mg/dL   Creatinine, Ser 1.39 (H) 0.61 - 1.24  mg/dL   Calcium 10.4 (H) 8.9 - 10.3 mg/dL   Total Protein 6.0 (L) 6.5 - 8.1 g/dL  Albumin 2.5 (L) 3.5 - 5.0 g/dL   AST 14 (L) 15 - 41 U/L   ALT 15 0 - 44 U/L   Alkaline Phosphatase 56 38 - 126 U/L   Total Bilirubin 0.9 0.3 - 1.2 mg/dL   GFR, Estimated 49 (L) >60 mL/min   Anion gap 4 (L) 5 - 15  Magnesium     Status: None   Collection Time: 04/21/22  5:02 AM  Result Value Ref Range   Magnesium 1.9 1.7 - 2.4 mg/dL    I have Reviewed nursing notes, Vitals, and Lab results since pt's last encounter. Pertinent lab results : see above I have ordered test including BMP, CBC, Mg I have reviewed the last note from staff over past 24 hours I have discussed pt's care plan and test results with nursing staff, case manager   LOS: 9 days   Dwyane Dee, MD [facility] 04/21/2022, 3:19 PM

## 2022-04-21 NOTE — NC FL2 (Signed)
[place] LEVEL OF CARE SCREENING TOOL     IDENTIFICATION  Patient Name: Daniel Sharp Birthdate: August 03, 1932 Sex: male Admission Date (Current Location): 04/11/2022  [facility] and [place] Number:  Herbalist and Address:  [facility],  [place], [place]      Provider Number: [id number]  Attending Physician Name and Address:  Dwyane Dee, MD  Relative Name and Phone Number:  Jorey, Dollard   [place]agn[place]   [phone]  Zellars,Bruce Son   [phone]  Steven, Basso Niece   [phone]    Current Level of Care: Hospital Recommended Level of Care: [place] Prior Approval Number:    Date Approved/Denied:   PASRR Number: [id number]  Discharge Plan: SNF    Current Diagnoses: Patient Active Problem List   Diagnosis Date Noted   Rectal mass 04/20/2022   Acute respiratory failure with hypoxia and hypercapnia ([place]) 24/82/5003   Acute metabolic encephalopathy 70/48/8891   BPH (benign prostatic hyperplasia) 04/17/2022   Acute on chronic diastolic CHF (congestive heart failure) ([place]) 04/17/2022   SAH (subarachnoid hemorrhage) ([place]) 04/17/2022   Meningioma ([place]) 04/17/2022   NSVT (nonsustained ventricular tachycardia) ([place]) 04/17/2022   Chronic kidney disease, stage 3a ([place]) 04/17/2022   Abnormal CT scan, colon    Pressure injury of skin 04/15/2022   Microscopic hematuria 04/12/2022   GIB (gastrointestinal bleeding) 04/12/2022   Sepsis ([place]) 04/12/2022   Hemorrhagic cystitis 04/12/2022   Dehydration 04/12/2022   Acute prerenal azotemia 04/12/2022   Gross hematuria 04/11/2022   IDA (iron deficiency anemia) 04/03/2022   Elevated troponin 02/13/2022   H/O mitral valve repair 02/13/2022   Generalized weakness 02/13/2022   Hypercalcemia 02/13/2022   Hyperbilirubinemia 02/13/2022   Renal cell carcinoma of left kidney ([place]) 01/30/2022   Myasthenia gravis without acute exacerbation  ([place]) 01/30/2022   Renal mass 01/22/2022   H/O mitral valve replacement 07/11/2021   Localized edema 07/11/2021   Overweight with body mass index (BMI) 25.0-29.9 11/23/2018   Persistent atrial fibrillation ([place]) 03/28/2012   Hypertension 03/09/2009   Hypercholesterolemia 03/09/2009    Orientation RESPIRATION BLADDER Height & Weight     Self, Place, Time  O2 (2L) Incontinent Weight: 183 lb 13.8 oz (83.4 kg) Height:  6' 0.5" (184.2 cm)  BEHAVIORAL SYMPTOMS/MOOD NEUROLOGICAL BOWEL NUTRITION STATUS      Incontinent Diet  AMBULATORY STATUS COMMUNICATION OF NEEDS Skin   Limited Assist Verbally PU Stage and Appropriate Care (PRN wound care)                       Personal Care Assistance Level of Assistance  Bathing, Feeding, Dressing Bathing Assistance: Limited assistance Feeding assistance: Independent Dressing Assistance: Limited assistance     Functional Limitations Info  Hearing, Speech, Sight Sight Info: Impaired Hearing Info: Adequate Speech Info: Adequate    SPECIAL CARE FACTORS FREQUENCY  PT (By licensed PT), OT (By licensed OT)     PT Frequency: Minimum 5x a week OT Frequency: Minimum 5x a week.            Contractures      Additional Factors Info  Code Status, Allergies Code Status Info: Partial Code Allergies Info: Aminoglycosides   Botox (Onabotulinumtoxina)   Corticosteroids   Hydroxychloroquine   Levaquin (Levofloxacin)   Macrolides And Ketolides   Magnesium-containing Compounds   Neuromuscular Blocking Agents   Penicillamine   Procainamide   Quinidine  Current Medications (04/21/2022):  This is the current hospital active medication list Current Facility-Administered Medications  Medication Dose Route Frequency Provider Last Rate Last Admin   acetaminophen (TYLENOL) tablet 650 mg  650 mg Oral Q6H PRN Dwyane Dee, MD   650 mg at 04/15/22 1429   Or   acetaminophen (TYLENOL) suppository 650 mg  650 mg Rectal Q6H PRN Dwyane Dee, MD        finasteride (PROSCAR) tablet 5 mg  5 mg Oral Daily Dwyane Dee, MD   5 mg at 04/21/22 0830   iron dextran complex (INFED) 1,000 mg in sodium chloride 0.9 % 500 mL IVPB  1,000 mg Intravenous Once Dwyane Dee, MD       Oral care mouth rinse  15 mL Mouth Rinse 4 times per day Dwyane Dee, MD   15 mL at 04/21/22 2595   Oral care mouth rinse  15 mL Mouth Rinse PRN Dwyane Dee, MD       pantoprazole (PROTONIX) EC tablet 40 mg  40 mg Oral BID Adrian Saran, RPH   40 mg at 04/21/22 0830   predniSONE (DELTASONE) tablet 10 mg  10 mg Oral Daily Dwyane Dee, MD   10 mg at 04/21/22 6387   pyridostigmine (MESTINON) injection 2 mg  2 mg Intravenous QID Dwyane Dee, MD   2 mg at 04/21/22 1344   Or   pyridostigmine (MESTINON) tablet 60 mg  60 mg Oral QID Dwyane Dee, MD   60 mg at 04/21/22 1648   tamsulosin (FLOMAX) capsule 0.4 mg  0.4 mg Oral Daily Dwyane Dee, MD   0.4 mg at 04/21/22 0830     Discharge Medications: Please see discharge summary for a list of discharge medications.  Relevant Imaging Results:  Relevant Lab Results:   Additional Information SSN [id number]  Ross Ludwig, LCSW

## 2022-04-21 NOTE — Progress Notes (Signed)
Physical Therapy Treatment Patient Details Name: Daniel Sharp MRN: [id number] DOB: [birth year] Today's Date: 04/21/2022   History of Present Illness Patient is a 88 y.o. male presenting to [facility] ED on 10/5 with hematuria, subacute upper GI bleed and sepsis due to hemorrhagic cystitis. PMHx significant for L renal cell carcinoma s/p L nephrectomy 01/22/22 (did not undergo chemo or radiation), Afib, OA, HTN, MG, back surgery, cholecystectomy, appendectomy, chronic iron deficiency, and recent fall with dislocated Rt finger, fractured Rt foot, Rt 5th rib fracture. Rapid response on 10/9 for decreased responses. He underwent brain imaging and was found to have [name], dural based mass in the right frontal lobe consistent with typical meningioma, and multiple small microhemorrhages consistent with cerebral amyloid angiopathy considered mild in severity. He was evaluated by neurosurgery due to these findings and recommended to have no further intervention or work-up for the meningioma/dural based mass.  SAH was also considered stable.    PT Comments    Pt assisted with mobility and requiring at least mod assist (+2 for safety) at this time.  Pt did not yet feel able to start ambulating today but was agreeable to remain OOB in recliner.  Pt also requiring supplemental oxygen.  Pt would benefit from SNF for rehab upon d/c.  Patient Saturations on Room Air at Rest = 93%  Patient Saturations on [employer] with taking a few steps to recliner = 86%  Patient Saturations on 2 Liters of oxygen = 91%    Recommendations for follow up therapy are one component of a multi-disciplinary discharge planning process, led by the attending physician.  Recommendations may be updated based on patient status, additional functional criteria and insurance authorization.  Follow Up Recommendations  Skilled nursing-short term rehab (<3 hours/day) Can patient physically be transported by private vehicle: No   Assistance Recommended at  Discharge Frequent or constant Supervision/Assistance  Patient can return home with the following A lot of help with walking and/or transfers;A little help with bathing/dressing/bathroom;Help with stairs or ramp for entrance;Assistance with cooking/housework;Assist for transportation   Equipment Recommendations  None recommended by PT    Recommendations for Other Services       Precautions / Restrictions Precautions Precautions: Fall Precaution Comments: monitor O2     Mobility  Bed Mobility Overal bed mobility: Needs Assistance Bed Mobility: Supine to Sit     Supine to sit: Mod assist     General bed mobility comments: cues for technique, assist for trunk upright; SPO2 93% at rest on room air    Transfers Overall transfer level: Needs assistance Equipment used: Rolling walker (2 wheels) Transfers: Sit to/from Stand, Bed to chair/wheelchair/BSC Sit to Stand: Mod assist, +2 safety/equipment   Step pivot transfers: Min assist, +2 safety/equipment       General transfer comment: multimodal cues for technique and weight shifting as well as full extension upon standing, son present and also stood by assist, SPO2 dropped to 83% on room air with transfer so reapplied 2L O2 [place] and cued pt for pursed lip breathing and SPO2 improved to 91% prior to therapist leaving room    Ambulation/Gait                   Stairs             Wheelchair Mobility    Modified Rankin (Stroke Patients Only)       Balance Overall balance assessment: Needs assistance         Standing balance support: Bilateral upper  extremity supported, During functional activity, Reliant on assistive device for balance Standing balance-Leahy Scale: Poor Standing balance comment: reliant on external support of RW and therapist                            Cognition Arousal/Alertness: Awake/alert Behavior During Therapy: Flat affect Overall Cognitive Status: Impaired/Different  from baseline Area of Impairment: Following commands, Problem solving                       Following Commands: Follows one step commands with increased time     Problem Solving: Slow processing, Decreased initiation General Comments: noted to have increased time to respond to questions. was appropriate during session. son was present,.        Exercises      General Comments        Pertinent Vitals/Pain Pain Assessment Pain Assessment: No/denies pain    Home Living                          Prior Function            PT Goals (current goals can now be found in the care plan section) Progress towards PT goals: Progressing toward goals    Frequency    Min 2X/week      PT Plan Current plan remains appropriate    Co-evaluation              AM-PAC PT "6 Clicks" Mobility   Outcome Measure  Help needed turning from your back to your side while in a flat bed without using bedrails?: A Lot Help needed moving from lying on your back to sitting on the side of a flat bed without using bedrails?: A Lot Help needed moving to and from a bed to a chair (including a wheelchair)?: A Lot Help needed standing up from a chair using your arms (e.g., wheelchair or bedside chair)?: A Lot Help needed to walk in hospital room?: Total Help needed climbing 3-5 steps with a railing? : Total 6 Click Score: 10    End of Session Equipment Utilized During Treatment: Gait belt Activity Tolerance: Patient limited by fatigue Patient left: in chair;with call bell/phone within reach;with chair alarm set;with family/visitor present Nurse Communication: Mobility status PT Visit Diagnosis: Unsteadiness on feet (R26.81);Muscle weakness (generalized) (M62.81);Difficulty in walking, not elsewhere classified (R26.2)     Time: 5009-3818 PT Time Calculation (min) (ACUTE ONLY): 20 min  Charges:  $Therapeutic Activity: 8-22 mins                     Jannette Spanner PT, DPT Physical  Therapist Acute Rehabilitation Services Preferred contact method: Secure Chat Weekend Pager Only: [phone] Office: [place] 04/21/2022, 3:40 PM

## 2022-04-21 NOTE — TOC Progression Note (Signed)
Transition of Care [facility]) - Progression Note    Patient Details  Name: Daniel Sharp MRN: [id number] Date of Birth: [birth year]  [facility]) CM/SW Contact  Ross Ludwig, [place] Phone Number: 04/21/2022, 5:49 PM  Clinical Narrative:     SNF bed search has been started, patient has been faxed out awaiting bed offers.  Patient will need insurance authorization prior to discharge.  Expected Discharge Plan: [place] Barriers to Discharge: No Barriers Identified  Expected Discharge Plan and Services Expected Discharge Plan: [place] arrangements for the past 2 months: Single Family Home                                       Social Determinants of Health (SDOH) Interventions    Readmission Risk Interventions     No data to display

## 2022-04-21 NOTE — Progress Notes (Signed)
NIF >-40 VC poor effort 1.75L

## 2022-04-21 NOTE — Progress Notes (Signed)
VC 1.0 poor effort NIF  -40

## 2022-04-21 NOTE — Progress Notes (Signed)
Pt requested to removed bipap. Education provided.

## 2022-04-21 NOTE — Consult Note (Signed)
Surgical Evaluation Requesting provider: Dwyane Dee MD  Chief Complaint: hematochezia  HPI: 88 year old male with A-fib on Coumadin, myasthenia gravis on chronic prednisone therapy, history of mitral valve replacement/repair, chronic stable subarachnoid hemorrhage (see note from Dr. Venetia Constable on 10/11), chronic right-sided heart failure, and hypertension admitted 10 days ago with hematuria and hematochezia.  This had actually been in the process of being worked up, and he had received 3 units of blood over the preceding 2 weeks, but his family brought him in as they felt he needed his work-up expedited.  Imaging did show a rectal soft tissue mass.  Urology (Dr. Gloriann Loan) has seen him for the hematuria.  Subsequently he underwent upper and lower endoscopy on 10/13 which confirmed the same: "- Palpable rectal mass and hemorrhoids found on digital rectal exam. - Stool in the entire examined colon - lavaged with adequate visualization. - The examined portion of the ileum was normal. - 12, 3 to 10 mm polyps in the rectum, in the descending colon, in the transverse colon, in the ascending colon and in the cecum, removed with a cold snare. Resected and retrieved. - One 35 mm polyp in the proximal descending colon, removed with piecemeal mucosal resection. Resected and retrieved. Treated with STSC. Clips (MR conditional) were placed. Tattooed. - Rule out malignancy, tumor in the distal rectum involving the dentate line. Biopsied. - Diverticulosis of the sigmoid and rectosigmoid colon. - Non-bleeding non-thrombosed external and internal hemorrhoids."  He has a history of a left nephrectomy in July of this year.  Allergies  Allergen Reactions   Aminoglycosides     Hx of MG    Botox [Onabotulinumtoxina]     Hx of MG    Corticosteroids     Caution with high doses as Hx of MG    Hydroxychloroquine     Hx of MG    Levaquin [Levofloxacin]     Hx of MG  - avoid all Fluoroquinolones   Macrolides And  Ketolides     Hx of MG    Magnesium-Containing Compounds     Use with caution as pt with Hx of MG    Neuromuscular Blocking Agents     Hx of MG    Penicillamine     Hx of MG    Procainamide     Hx of MG    Quinidine     Hx of MG     Past Medical History:  Diagnosis Date   A-fib (HCC)    history of   Arthritis    Dyspnea    H/O mitral valve repair 07/2003   Hypertension    Ocular myasthenia gravis ([place])    takes pred   Pneumonia     Past Surgical History:  Procedure Laterality Date   APPENDECTOMY     BACK SURGERY     cyst removed L3 and L4   BIOPSY  04/19/2022   Procedure: BIOPSY;  Surgeon: Irving Copas., MD;  Location: [facility];  Service: Gastroenterology;;   CHOLECYSTECTOMY     COLONOSCOPY WITH PROPOFOL N/A 04/19/2022   Procedure: COLONOSCOPY WITH PROPOFOL;  Surgeon: Irving Copas., MD;  Location: [facility];  Service: Gastroenterology;  Laterality: N/A;   ENDOSCOPIC MUCOSAL RESECTION  04/19/2022   Procedure: ENDOSCOPIC MUCOSAL RESECTION;  Surgeon: Rush Landmark Telford Nab., MD;  Location: [facility];  Service: Gastroenterology;;   ESOPHAGOGASTRODUODENOSCOPY (EGD) WITH PROPOFOL N/A 04/19/2022   Procedure: ESOPHAGOGASTRODUODENOSCOPY (EGD) WITH PROPOFOL;  Surgeon: Irving Copas., MD;  Location: [facility];  Service: Gastroenterology;  Laterality: N/A;   FINGER SURGERY Right    R hand   HEMOSTASIS CLIP PLACEMENT  04/19/2022   Procedure: HEMOSTASIS CLIP PLACEMENT;  Surgeon: Irving Copas., MD;  Location: [facility];  Service: Gastroenterology;;   MITRAL VALVE REPAIR     on coumadin   NOSE SURGERY     POLYPECTOMY  04/19/2022   Procedure: POLYPECTOMY;  Surgeon: Rush Landmark Telford Nab., MD;  Location: [facility];  Service: Gastroenterology;;   ROBOTIC ASSITED PARTIAL NEPHRECTOMY Left 01/22/2022   Procedure: XI ROBOTIC ASSITED NEPHRECTOMY;  Surgeon: Ceasar Mons, MD;  Location: [facility];  Service: Urology;   Laterality: Left;   SUBMUCOSAL LIFTING INJECTION  04/19/2022   Procedure: SUBMUCOSAL LIFTING INJECTION;  Surgeon: Rush Landmark Telford Nab., MD;  Location: [facility];  Service: Gastroenterology;;   SUBMUCOSAL TATTOO INJECTION  04/19/2022   Procedure: SUBMUCOSAL TATTOO INJECTION;  Surgeon: Irving Copas., MD;  Location: [facility];  Service: Gastroenterology;;    Family History  Problem Relation Age of Onset   Other Mother        Gallbladder removed   Stroke Father        intracranial stroke   Stroke Brother    Colon cancer Neg Hx    Rectal cancer Neg Hx     Social History   Socioeconomic History   Marital status: Widowed    Spouse name: Not on file   Number of children: 2   Years of education: Not on file   Highest education level: Not on file  Occupational History   Not on file  Tobacco Use   Smoking status: Former    Types: Cigarettes, Pipe, Cigars   Smokeless tobacco: Former    Types: Chew   Tobacco comments:    Uses nicotine gum   Vaping Use   Vaping Use: Never used  Substance and Sexual Activity   Alcohol use: Yes    Alcohol/week: 7.0 standard drinks of alcohol    Types: 7 Glasses of wine per week    Comment: Drinks wine when sons will let him   Drug use: Never   Sexual activity: Not on file  Other Topics Concern   Not on file  Social History Narrative   Lives in [place], [place].  Son lives in [place]      Right [place]    Lives in a one story home with a basement.    [facility] at [facility] at this time   Social Determinants of Health   Financial Resource Strain: Not on file  Food Insecurity: No Food Insecurity (04/12/2022)   Hunger Vital Sign    Worried About Running Out of Food in the Last Year: Never true    Ran Out of Food in the Last Year: Never true  Transportation Needs: No Transportation Needs (04/12/2022)   PRAPARE - Hydrologist (Medical): No    Lack of Transportation (Non-Medical): No  Physical  Activity: Not on file  Stress: Not on file  Social Connections: Not on file    No current facility-administered medications on file prior to encounter.   Current Outpatient Medications on File Prior to Encounter  Medication Sig Dispense Refill   amLODipine (NORVASC) 5 MG tablet Take 1 tablet (5 mg total) by mouth daily. 30 tablet 3   carboxymethylcellulose (REFRESH PLUS) 0.5 % SOLN 1 drop daily as needed (dry eyes).     Cholecalciferol (VITAMIN D3 PO) Take 1 tablet by mouth daily.  docusate sodium (COLACE) 100 MG capsule Take 1 capsule (100 mg total) by mouth 2 (two) times daily. (Patient taking differently: Take 100 mg by mouth daily.)     ferrous sulfate 325 (65 FE) MG tablet Take 325 mg by mouth daily with breakfast.     furosemide (LASIX) 20 MG tablet Take 20 mg by mouth daily.     glucosamine-chondroitin 500-400 MG tablet Take 1 tablet by mouth daily.     HYDROcodone-acetaminophen (NORCO/VICODIN) 5-325 MG tablet Take 1-2 tablets by mouth every 6 (six) hours as needed for moderate pain or severe pain. 15 tablet 0   predniSONE (DELTASONE) 5 MG tablet Take 2 tablets (10 mg total) by mouth daily. (Patient taking differently: Take 10 mg by mouth daily. Taking 2 tabs by mouth daily) 180 tablet 1   pyridostigmine (MESTINON) 60 MG tablet Take 1 tablet (60 mg total) by mouth every 8 (eight) hours.     tamsulosin (FLOMAX) 0.4 MG CAPS capsule Take 0.4 mg by mouth daily.     warfarin (COUMADIN) 5 MG tablet Take 2.5-5 mg by mouth See admin instructions. Take 1 tablet (5 mg)on  Mon. Weds. Thursday, Friday and Sunday. Taking 1/2 tab( 2.5 mg) on Tues & Saturday      Review of Systems: a complete, 10pt review of systems was completed with pertinent positives and negatives as documented in the HPI  Physical Exam: Vitals:   04/21/22 0544 04/21/22 0734  BP: 129/79   Pulse: 72 69  Resp: (!) 21 20  Temp: 98.7 F (37.1 C)   SpO2: 100% 96%   Gen: A&Ox3, no distress  Unlabored  respirations Abdomen soft, nondistended, nontender.  Well-healed port sites and left lower quadrant incision      Latest Ref Rng & Units 04/21/2022    5:02 AM 04/20/2022    4:47 PM 04/20/2022    2:48 AM  CBC  WBC 4.0 - 10.5 K/uL 10.0   8.5   Hemoglobin 13.0 - 17.0 g/dL 7.7  8.2  7.0   Hematocrit 39.0 - 52.0 % 25.6  27.0  23.6   Platelets 150 - 400 K/uL 171   191        Latest Ref Rng & Units 04/21/2022    5:02 AM 04/20/2022    2:48 AM 04/19/2022    2:37 AM  CMP  Glucose 70 - 99 mg/dL 98  100  90   BUN 8 - 23 mg/dL 41  42  38   Creatinine 0.61 - 1.24 mg/dL 1.39  1.29  1.19   Sodium 135 - 145 mmol/L 139  140  142   Potassium 3.5 - 5.1 mmol/L 4.2  4.0  3.9   Chloride 98 - 111 mmol/L 102  103  101   CO2 22 - 32 mmol/L 33  34  33   Calcium 8.9 - 10.3 mg/dL 10.4  11.0  11.2   Total Protein 6.5 - 8.1 g/dL 6.0  6.0  6.1   Total Bilirubin 0.3 - 1.2 mg/dL 0.9  0.9  0.7   Alkaline Phos 38 - 126 U/L 56  59  60   AST 15 - 41 U/L '14  15  17   '$ ALT 0 - 44 U/L '15  16  18     '$ Lab Results  Component Value Date   INR 1.1 04/21/2022   INR 1.2 04/20/2022   INR 1.2 04/19/2022    Imaging: No results found.   A/P: Low-grade GI bleed and low rectal  mass consistent with malignancy -Await pathology -proceed with further work-up of rectal mass including MRI as mentioned in Dr. Donneta Romberg note yesterday. Will check CEA. -Consider oncology consult once pathology results. Had a CT chest, abdomen and pelvis all without contrast this admission- may need scans with contrast for metatatic workup but will defer to onc  -No plan for emergent surgical intervention at this time, but will follow peripherally to help coordinate further outpatient follow-up with colorectal service   Patient Active Problem List   Diagnosis Date Noted   Rectal mass 04/20/2022   Acute respiratory failure with hypoxia and hypercapnia (HCC) 79/08/4095   Acute metabolic encephalopathy 35/32/9924   BPH (benign prostatic  hyperplasia) 04/17/2022   Acute on chronic diastolic CHF (congestive heart failure) ([place]) 04/17/2022   SAH (subarachnoid hemorrhage) ([place]) 04/17/2022   Meningioma ([place]) 04/17/2022   NSVT (nonsustained ventricular tachycardia) ([place]) 04/17/2022   Chronic kidney disease, stage 3a ([place]) 04/17/2022   Abnormal CT scan, colon    Pressure injury of skin 04/15/2022   Microscopic hematuria 04/12/2022   GIB (gastrointestinal bleeding) 04/12/2022   Sepsis ([place]) 04/12/2022   Hemorrhagic cystitis 04/12/2022   Dehydration 04/12/2022   Acute prerenal azotemia 04/12/2022   Gross hematuria 04/11/2022   IDA (iron deficiency anemia) 04/03/2022   Elevated troponin 02/13/2022   H/O mitral valve repair 02/13/2022   Generalized weakness 02/13/2022   Hypercalcemia 02/13/2022   Hyperbilirubinemia 02/13/2022   Renal cell carcinoma of left kidney ([place]) 01/30/2022   Myasthenia gravis without acute exacerbation ([place]) 01/30/2022   Renal mass 01/22/2022   H/O mitral valve replacement 07/11/2021   Localized edema 07/11/2021   Overweight with body mass index (BMI) 25.0-29.9 11/23/2018   Persistent atrial fibrillation ([place]) 03/28/2012   Hypertension 03/09/2009   Hypercholesterolemia 03/09/2009       Romana Juniper, MD [facility]  See AMION to contact appropriate on-call provider

## 2022-04-21 NOTE — Progress Notes (Signed)
Unable to do pt 0800 VC due to pt being unavailable each time, RT will continue to monitor prn

## 2022-04-22 ENCOUNTER — Other Ambulatory Visit: Payer: Self-pay

## 2022-04-22 DIAGNOSIS — J9601 Acute respiratory failure with hypoxia: Secondary | ICD-10-CM | POA: Diagnosis not present

## 2022-04-22 DIAGNOSIS — G9341 Metabolic encephalopathy: Secondary | ICD-10-CM | POA: Diagnosis not present

## 2022-04-22 DIAGNOSIS — K922 Gastrointestinal hemorrhage, unspecified: Secondary | ICD-10-CM | POA: Diagnosis not present

## 2022-04-22 DIAGNOSIS — I5033 Acute on chronic diastolic (congestive) heart failure: Secondary | ICD-10-CM | POA: Diagnosis not present

## 2022-04-22 DIAGNOSIS — K6289 Other specified diseases of anus and rectum: Secondary | ICD-10-CM | POA: Diagnosis not present

## 2022-04-22 LAB — TYPE AND SCREEN
ABO/RH(D): A POS
Antibody Screen: NEGATIVE
Unit division: 0

## 2022-04-22 LAB — CBC WITH DIFFERENTIAL/PLATELET
Abs Immature Granulocytes: 0.18 10*3/uL — ABNORMAL HIGH (ref 0.00–0.07)
Basophils Absolute: 0 10*3/uL (ref 0.0–0.1)
Basophils Relative: 0 %
Eosinophils Absolute: 0 10*3/uL (ref 0.0–0.5)
Eosinophils Relative: 0 %
HCT: 25 % — ABNORMAL LOW (ref 39.0–52.0)
Hemoglobin: 7.6 g/dL — ABNORMAL LOW (ref 13.0–17.0)
Immature Granulocytes: 2 %
Lymphocytes Relative: 9 %
Lymphs Abs: 1.1 10*3/uL (ref 0.7–4.0)
MCH: 29 pg (ref 26.0–34.0)
MCHC: 30.4 g/dL (ref 30.0–36.0)
MCV: 95.4 fL (ref 80.0–100.0)
Monocytes Absolute: 0.6 10*3/uL (ref 0.1–1.0)
Monocytes Relative: 5 %
Neutro Abs: 9.3 10*3/uL — ABNORMAL HIGH (ref 1.7–7.7)
Neutrophils Relative %: 84 %
Platelets: 150 10*3/uL (ref 150–400)
RBC: 2.62 MIL/uL — ABNORMAL LOW (ref 4.22–5.81)
RDW: 14.9 % (ref 11.5–15.5)
WBC: 11.2 10*3/uL — ABNORMAL HIGH (ref 4.0–10.5)
nRBC: 0 % (ref 0.0–0.2)

## 2022-04-22 LAB — COMPREHENSIVE METABOLIC PANEL
ALT: 20 U/L (ref 0–44)
AST: 19 U/L (ref 15–41)
Albumin: 2.5 g/dL — ABNORMAL LOW (ref 3.5–5.0)
Alkaline Phosphatase: 63 U/L (ref 38–126)
Anion gap: 3 — ABNORMAL LOW (ref 5–15)
BUN: 46 mg/dL — ABNORMAL HIGH (ref 8–23)
CO2: 31 mmol/L (ref 22–32)
Calcium: 9.7 mg/dL (ref 8.9–10.3)
Chloride: 101 mmol/L (ref 98–111)
Creatinine, Ser: 1.27 mg/dL — ABNORMAL HIGH (ref 0.61–1.24)
GFR, Estimated: 55 mL/min — ABNORMAL LOW (ref >60)
Glucose, Bld: 100 mg/dL — ABNORMAL HIGH (ref 70–99)
Potassium: 4.3 mmol/L (ref 3.5–5.1)
Sodium: 135 mmol/L (ref 135–145)
Total Bilirubin: 1.2 mg/dL (ref 0.3–1.2)
Total Protein: 5.9 g/dL — ABNORMAL LOW (ref 6.5–8.1)

## 2022-04-22 LAB — PROTIME-INR
INR: 1.1 (ref 0.8–1.2)
Prothrombin Time: 14.5 seconds (ref 11.4–15.2)

## 2022-04-22 LAB — BPAM RBC
Blood Product Expiration Date: 202311092359
ISSUE DATE / TIME: 202310141128
Unit Type and Rh: 6200

## 2022-04-22 LAB — MAGNESIUM: Magnesium: 1.8 mg/dL (ref 1.7–2.4)

## 2022-04-22 NOTE — TOC Progression Note (Addendum)
Transition of Care [facility]) - Progression Note    Patient Details  Name: Sir Mallis MRN: [id number] Date of Birth: 08/28/1932  [facility]) CM/SW Contact  Evlyn Amason, Juliann Pulse, RN Phone Number: 04/22/2022, 9:25 AM  Clinical Narrative: Spoke to son [name]- prefers [facility]-informed him they have not made a bed offer-recc [facility] to call [facility]-informed of bed offers-[facility] currently- await choice.      Expected Discharge Plan: [place] Barriers to Discharge: Continued Medical Work up  Expected Discharge Plan and Services Expected Discharge Plan: [place] arrangements for the past 2 months: Single Family Home                                       Social Determinants of Health (SDOH) Interventions    Readmission Risk Interventions     No data to display

## 2022-04-22 NOTE — Progress Notes (Signed)
Pt performed NIF and Vital Capacity.  Average of three attempts below:  NIF= >-40 VC= 1.1L

## 2022-04-22 NOTE — Progress Notes (Signed)
RT NOTE:  NIF: -30 VC: 0.7L  Good patient effort.

## 2022-04-22 NOTE — Progress Notes (Signed)
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  Also chronic iron deficiency anemia.  He is on chronic steroids for his ocular myasthenia and?  Mestinon  He has few month history of dark-colored stool for which outpatient EGD and colonoscopy was scheduled for April 18, 2022.  Prior to admission required 3 units of PRBC but he was admitted 04/11/2022 with severe gross hematuria suspected hemorrhagic cystitis /admission creatinine of 1.4 mg percent.  Warfarin was held.  Empiric Rocephin was started.  CT abdomen did suggest a rectal mass.  Coumadin was held.  He also had slightly high for thalassemia at 11.8 treated with hydration.  Albumin admission was low at 2.5  Past Medical History:    has a past medical history of A-fib ([place]), Arthritis, Dyspnea, H/O mitral valve repair (07/2003), Hypertension, Ocular myasthenia gravis ([place]), and Pneumonia.   has a past surgical history that includes Appendectomy; Nose surgery; Mitral valve repair; Cholecystectomy; Back surgery; Finger surgery (Right); Robotic assited partial nephrectomy (Left, 01/22/2022); Esophagogastroduodenoscopy (egd) with propofol (N/A, 04/19/2022); Colonoscopy with propofol (N/A, 04/19/2022); biopsy (04/19/2022); polypectomy (04/19/2022); Endoscopic mucosal resection (04/19/2022); Submucosal lifting injection (04/19/2022); Submucosal tattoo injection (04/19/2022); and Hemostasis clip placement (04/19/2022).   Significant Hospital Events:  04/11/2022 -  admit  - 04/13/2022: GI consult.  Plan for endoscopy and colonoscopy once INR normalizes [at admission 3].  Also had nonsustained V. Tach  -04/14/2022: Found to be generally weak but otherwise alert.  INR 2.9.  Complains of general fatigue.  Son reported progressive weakness thought to be due to physical deconditioning from poor p.o. intake   - 04/15/2022: Progressive weakness and unresponsiveness.  Son concerned significant negative change.  Minimally responsive.  Blood gas with severe acute on chronic respiratory acidosis.  Son denies that he is a smoker having underlying lung disease.  Patient transferred to the ICU and CCM consulted.  Neuro consultation placed.  Chest x-ray suggestive of pulmonary congestion [significantly worse compared to August 2023], CT head with trace SAH  - Neuro consult - monitor with Nif and FVC and Abg  - BNO 313  - LTD code   - 04/16/22 - BiPAP overnight and NiF not done. Hypercapnia improved on ABG but still PCO2 > 50. Afebirl ewiht normal WBC. Worsening CXR R > L basal air space disease. Creat better to 1.2. BNP up at 300s.  PCT 0.27. ECHO pending. Got lasix x 1 yearsstraay . -2.5L negative balance. Much more awake today but confused. No hematuria. No GI bleed  - Uro consult for gross hematuria - Rx continue tamsulosin and finaseerted. Hold off CT hemarutia protocol. 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 CAA re;ated multiple smal  hemorrhages and stable SAH   04/17/22 - NiF -40.[place] Kitchen NSGY consult - supportive care Uro consult - opd follolwup. Afebrile. . On abc. No bleeding. On 2L [place] . Being diuresed  -6L [place]. Off 2L [place] and pulse ox 96%. RN concnerned about frequent NSVTACh in setting of c hronic A Fib. ECHO - ok. Last EKG 2d ago with  normal QTc. Mag normal. Son at bedside.  Feels dad is doing ok but less goofy/chirpy today  04/18/22 -  Calcium levels continue to be mild to moderate high  and range bound. CO2 still hgh bbut  better. Lasix stopped after Creat jumped up and ABG with metab alkalosis. Afebrile.  Pulse ox 83% RA -> 2L 100%.  STil pleasantly confused but color and strength better - per son . NIF Good  04/19/22 less goofy per son. BiPAP only few hours per RN due to bowel prep need. For UGI/Colonoscopy in few hours in endoscopy suite. Calcium still elevated after calcitonin. AFebrile. Still needing 2L [place]  Interim History / Subjective:   NAEON. Wearing bipap at night. O2 sat 99% on2-3L [place]. CXR prior demonstrates serial improvement.    Objective   Blood pressure 137/64, pulse 84, temperature 98.6 F (37 C), temperature source Oral, resp. rate 18, height 6' 0.5" (1.842 m), weight 87.9 kg, SpO2 98 %.    FiO2 (%):  [28 %] 28 %   Intake/Output Summary (Last 24 hours) at 04/22/2022 1057 Last data filed at 04/22/2022 0900 Gross per 24 hour  Intake 890 ml  Output 1100 ml  Net -210 ml    Filed Weights   04/20/22 0500 04/21/22 0544 04/22/22 0500  Weight: 83 kg 83.4 kg 87.9 kg    General Appearance: elderly, frail Head:  Normocephalic, without obvious abnormality, atraumatic Eyes:  PERRL, no icterus Lungs: Clear to auscultation bilaterally, NWOB on 3L [place]:  S1 and S2 normal. Systolic murmur present Abdomen:  Soft, no masses, no organomegaly    Assessment & Plan:   Acute on chronic respiratory acidosis with hypoxemic respiratory failure: Imaging with effusions and bilateral infiltrate suggestive of pulmonary edema and volume overload.  Suggestive of acute on chronic congestive heart failure.  Improving with diuresis on serial chest x-rays. --Wean oxygen for saturation goal 88% or higher -- Recommend IV Lasix for diuresis as tolerated -- Recommend close interval follow-up with PCP and cardiology after discharge -- Recommend outpatient sleep study after discharge  PCCM will sign off  Best practice (daily eval):   Per primary    ATTESTATION & SIGNATURE    Lanier Clam, MD See  Amion for contact info  04/22/2022 10:57 AM

## 2022-04-22 NOTE — Progress Notes (Signed)
RT NOTE:  NIF: -24 VC: 0.85L  Good patient effort

## 2022-04-22 NOTE — Progress Notes (Signed)
Speech Language Pathology Treatment: Dysphagia  Patient Details Name: Daniel Sharp MRN: [id number] DOB: 02/04/1933 Today's Date: 04/22/2022 Time: 9622-2979 SLP Time Calculation (min) (ACUTE ONLY): 20 min  Assessment / Plan / Recommendation Clinical Impression  Patient seen by SLP for skilled treatment focused on dysphagia goals. Patient was asleep but awoke easily to SLP's voice. Lunch tray was in room after SLP assisted with raising HOB, patient maintained alertness and was receptive to having lunch. He was slow to respond, voice weak but  he would often deny assistance. He opted to drink tomato soup from bowl but would lose control of it without assistance and did spill on himself twice. Patient was able to manage cup to take sips from straw of drink, and sandwich (cut up into small pieces) without significant difficulty and it seems that managing utensils is the primary difficulty. No overt s/s aspiration or penetration observed. Patient was slow with all aspects of his motor function so therefore, he exhibited delayed mastication and bolus formation, transit. SLP to s/o at this time as patient appears to be tolerating PO diet of regular/mechanical soft solids and thin liquids.   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  Also chronic iron deficiency anemia.  He is on chronic steroids for his ocular myasthenia and?  Mestinon     He has few month history of dark-colored stool for which outpatient EGD and colonoscopy was scheduled for April 18, 2022.  Prior to admission required 3 units of PRBC but he was admitted 04/11/2022 with severe gross hematuria suspected hemorrhagic cystitis /admission creatinine of 1.4 mg  percent.  Warfarin was held.  Empiric Rocephin was started.  CT abdomen did suggest a rectal mass.  Coumadin was held.  He also had slightly high for thalassemia at 11.8 treated with hydration; CXR 04/17/22 similar bilateral pleural effusions with adjacent infiltrate or atelectasis.  Required Bipap on 04/16/22 when initial BSE attempted.  ST f/u for BSE to assess current swallow function.      SLP Plan  All goals met      Recommendations for follow up therapy are one component of a multi-disciplinary discharge planning process, led by the attending physician.  Recommendations may be updated based on patient status, additional functional criteria and insurance authorization.    Recommendations  Diet recommendations: Dysphagia 3 (mechanical soft);Regular;Thin liquid Liquids provided via: Cup;Straw Medication Administration: Whole meds with liquid Supervision: Full supervision/cueing for compensatory strategies;Staff to assist with self feeding Compensations: Slow rate;Small sips/bites;Minimize environmental distractions Postural Changes and/or Swallow Maneuvers: Seated upright 90 degrees                Oral Care Recommendations: Oral care BID;Staff/trained caregiver to provide oral care Follow Up Recommendations: Follow physician's recommendations for discharge plan and follow up therapies Assistance recommended at discharge: Intermittent Supervision/Assistance SLP Visit Diagnosis: Dysphagia, unspecified (R13.10) Plan: All goals met         Sonia Baller, MA, CCC-SLP Speech Therapy

## 2022-04-22 NOTE — Progress Notes (Signed)
Progress Note    Daniel Sharp   JJO:[id number]  DOB: April 01, 1933  DOA: 04/11/2022     10 PCP: [name], MD  Initial CC: hematuria, melanotic stools, weakness  Hospital Course: 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  He was admitted on 04/11/22 with melanotic stools and gross hematuria.  He had been undergoing outpatient GI evaluation for EGD and colonoscopy. After admission he became grossly weaker with worsened respiratory status as well.  He was found to have hypercarbic respiratory failure and transferred to the ICU.  Pulmonology and neurology were also consulted.  He was placed on BiPAP with improvement in his mentation and respiratory status.  He was also noted to have volume overload and was started on diuresis, again with good response. He was not felt to warrant IVIG. He underwent brain imaging and was found to have [place], dural based mass in the right frontal lobe consistent with typical meningioma, and multiple small microhemorrhages consistent with cerebral amyloid angiopathy considered mild in severity. He was evaluated by neurosurgery due to these findings and recommended to have no further intervention or work-up for the meningioma/dural based mass.  SAH was also considered stable.  Interval History:  No events overnight.  Son present bedside this morning.  Patient still doing okay. Tentative plan will be for rehab at discharge; patient is amenable as well as family.    Assessment and Plan:  GIB Chronic IDA - on oral iron at home - low iron stores on iron studies on 9/20 and 10/6 - s/p 2 units PRBC on 04/09/22 (1 unit on 9/26) - appreciate GI assistance; underwent EGD/colonoscopy on 10/13 - Infed be given 10/15 - Hgb 7 g/dL on 10/14, will give 1 unit PRBC, 10/14 - continue to hold anticoagulation  (see rectal mass)  Rectal mass -Colonoscopy performed 04/19/2022; polypoid and ulcerated nonobstructing medium-sized mass found in the distal rectum and extends to and involves the dentate line.  Mass partially circumferential and measuring at least 3 cm in length.  Oozing was noted to be present and evidence of 2 areas of clot.  This was felt to be the culprit for his rectal bleeding and will have ongoing risk for further bleeding until definitively treated.  Therefore in setting of ongoing anemia requiring blood transfusions, anticoagulation must continue to be held -Follow-up biopsy results -Pelvic MRI shows 4.1 cm polypoid right anterior lower rectal mass extending to anorectal junction - surgery consulted to help facilitate next steps as well   Hematuria - resolved  BPH with LUTS RCC s/p left nephrectomy 01/22/22 - Appreciate urology evaluation - Some suspicion that his hematuria may be due to prostatic origin per urology - Continue tamsulosin - Finasteride added per urology - No further hematuria since 04/11/2022.  If develops again, will reconsult urology  Acute hypoxic hypercarbic respiratory failure-improving -Presumed multifactorial in setting of volume overload and hypercarbia.  There was some initial concern for contribution from myasthenia gravis as well. -Patient responded well to diuresis and BiPAP use - appreciate pulmonology and neuro insight; patient considered appropriate for EGD/colonoscopy; if were to decompensate then intubation would be recommended - tolerated anesthesia well for EGD and c-scope on 10/13 -Continue weaning oxygen to off.  Turned down to 1 L this morning, 16/07/930  Acute metabolic encephalopathy - resolved  - seems labile at times  -  due to hypercarbia and deconditioning; some component of hospital delirium and cannot fully rule out hypercalcemia playing a factor - now improved s/p BiPAP use - repeat ABG if further lethargy/somnolence  - Given good  clinical response in general, hold off on palliative care consult at this time - see hypercalcemia  Hypercalcemia - improved  - some possible contribution to mentation from hyperCa - s/p zometa and calcitonin with improvement - mentation stable and no other worriesome features - monitoring Ca level further for now (PTH level normal)  CKD3a - patient has history of CKD3a. Baseline creat ~ 1.3 - 1.6 - possible contribution from L nephrectomy as well - at risk for ATN with hypoxia  - continue trending renal function   Acute on chronic dCHF -New echo during hospitalization; EF 55%, no RWMA; severe concentric LVH.  Indeterminate diastolic parameters.  Mild enlarged right ventricle -BNP 313; pulmonary crackles, respiratory distress/dyspnea/SOB, right pleural effusion - good response with lasix which is now on hold   Ocular myasthenia gravis - neurology following as well - continue NIFs as per neuro (change to TID) - continue prednisone and mestinon  -Caution with medications that can exacerbate - Neurology recommends elective intubation if were to worsen  SAH - stable Meningioma  -Appreciate neurosurgery input.  SAH stable and there are multiple small "microhemorrhages". This has been discussed with neurology (Dr. Curly Shores). The microhemorrhages are felt moreso related to long term anticoagulation since 2007 and less likely from [place] (as originally proposed from [place]). He is felt to NOT have contraindication to anticoagulation in this context   Culture-negative sepsis Possible hemorrhagic cystitis  Concern for HAP - initially treated with cefepime; discontinued after discussion with neurology as could also be playing a factor in mentation  - now afebrile and mild leukocytosis has normalized - cefepime discontinued on 10/11; keep off abx; discussed with PCCM  NSVT - asymptomatic - monitor electrolytes   PAF Hx MV repair - on chronic Coumadin -Anticoagulation currently on hold for  above work-ups to be completed  HTN - continue current treatment  Old records reviewed in assessment of this patient  Antimicrobials: Rocephin 10/5 >>10/9 Cefepime 10/10 >> 10/11   DVT prophylaxis:  SCDs Start: 04/11/22 2325   Code Status:   Code Status: Partial Code  Mobility Assessment (last 72 hours)     Mobility Assessment     Row Name 04/22/22 1000 04/21/22 2114 04/21/22 1539 04/21/22 0830 04/20/22 2051   Does patient have an order for bedrest or is patient medically unstable No - Continue assessment No - Continue assessment -- No - Continue assessment No - Continue assessment   What is the highest level of mobility based on the progressive mobility assessment? Level 3 (Stands with assist) - Balance while standing  and cannot march in place -- Level 4 (Walks with assist in room) - Balance while marching in place and cannot step forward and back - Complete Level 4 (Walks with assist in room) - Balance while marching in place and cannot step forward and back - Complete --   Is the above level different from baseline mobility prior to current illness? Yes - Recommend PT order Yes - Recommend PT order -- Yes - Recommend PT order Yes - Recommend PT order            Barriers to discharge:  Disposition Plan:  pending clinical course Status is: Inpt  Objective: Blood pressure 110/67, pulse 87, temperature 98.7 F (37.1 C), temperature source Oral, resp. rate  20, height 6' 0.5" (1.842 m), weight 87.9 kg, SpO2 98 %.  Examination:  Physical Exam Constitutional:      General: He is not in acute distress.    Appearance: Normal appearance.  HENT:     Head: Normocephalic and atraumatic.     Mouth/Throat:     Mouth: Mucous membranes are moist.  Eyes:     Extraocular Movements: Extraocular movements intact.     Comments: Mild left ptosis  Cardiovascular:     Rate and Rhythm: Normal rate and regular rhythm.     Heart sounds: Normal heart sounds.  Pulmonary:     Effort:  Pulmonary effort is normal. No respiratory distress.     Breath sounds: No wheezing.  Abdominal:     General: Bowel sounds are normal. There is no distension.     Palpations: Abdomen is soft.     Tenderness: There is no abdominal tenderness.  Musculoskeletal:     Cervical back: Normal range of motion and neck supple.     Comments: Trace L>R LE edema  Skin:    General: Skin is warm and dry.  Neurological:     Mental Status: He is alert.     Comments: Generalized weakness, worse in LE.  Psychiatric:        Mood and Affect: Mood normal.        Behavior: Behavior normal.      Consultants:  PCCM - peripheral following  Urology - signed off GI - signed off Neurosurgery - signed off  Procedures:  10/13: EGD/colonoscopy  Data Reviewed: Results for orders placed or performed during the hospital encounter of 04/11/22 (from the past 24 hour(s))  Protime-INR     Status: None   Collection Time: 04/22/22  4:18 AM  Result Value Ref Range   Prothrombin Time 14.5 11.4 - 15.2 seconds   INR 1.1 0.8 - 1.2  CBC with Differential/Platelet     Status: Abnormal   Collection Time: 04/22/22  4:18 AM  Result Value Ref Range   WBC 11.2 (H) 4.0 - 10.5 K/uL   RBC 2.62 (L) 4.22 - 5.81 MIL/uL   Hemoglobin 7.6 (L) 13.0 - 17.0 g/dL   HCT 25.0 (L) 39.0 - 52.0 %   MCV 95.4 80.0 - 100.0 fL   MCH 29.0 26.0 - 34.0 pg   MCHC 30.4 30.0 - 36.0 g/dL   RDW 14.9 11.5 - 15.5 %   Platelets 150 150 - 400 K/uL   nRBC 0.0 0.0 - 0.2 %   Neutrophils Relative % 84 %   Neutro Abs 9.3 (H) 1.7 - 7.7 K/uL   Lymphocytes Relative 9 %   Lymphs Abs 1.1 0.7 - 4.0 K/uL   Monocytes Relative 5 %   Monocytes Absolute 0.6 0.1 - 1.0 K/uL   Eosinophils Relative 0 %   Eosinophils Absolute 0.0 0.0 - 0.5 K/uL   Basophils Relative 0 %   Basophils Absolute 0.0 0.0 - 0.1 K/uL   Immature Granulocytes 2 %   Abs Immature Granulocytes 0.18 (H) 0.00 - 0.07 K/uL  Comprehensive metabolic panel     Status: Abnormal   Collection Time:  04/22/22  4:18 AM  Result Value Ref Range   Sodium 135 135 - 145 mmol/L   Potassium 4.3 3.5 - 5.1 mmol/L   Chloride 101 98 - 111 mmol/L   CO2 31 22 - 32 mmol/L   Glucose, Bld 100 (H) 70 - 99 mg/dL   BUN 46 (H) 8 - 23 mg/dL  Creatinine, Ser 1.27 (H) 0.61 - 1.24 mg/dL   Calcium 9.7 8.9 - 10.3 mg/dL   Total Protein 5.9 (L) 6.5 - 8.1 g/dL   Albumin 2.5 (L) 3.5 - 5.0 g/dL   AST 19 15 - 41 U/L   ALT 20 0 - 44 U/L   Alkaline Phosphatase 63 38 - 126 U/L   Total Bilirubin 1.2 0.3 - 1.2 mg/dL   GFR, Estimated 55 (L) >60 mL/min   Anion gap 3 (L) 5 - 15  Magnesium     Status: None   Collection Time: 04/22/22  4:18 AM  Result Value Ref Range   Magnesium 1.8 1.7 - 2.4 mg/dL    I have Reviewed nursing notes, Vitals, and Lab results since pt's last encounter. Pertinent lab results : see above I have ordered test including BMP, CBC, Mg I have reviewed the last note from staff over past 24 hours I have discussed pt's care plan and test results with nursing staff, case manager   LOS: 10 days   Dwyane Dee, MD [facility] 04/22/2022, 2:50 PM

## 2022-04-23 ENCOUNTER — Encounter: Payer: Self-pay | Admitting: Gastroenterology

## 2022-04-23 DIAGNOSIS — C642 Malignant neoplasm of left kidney, except renal pelvis: Secondary | ICD-10-CM

## 2022-04-23 DIAGNOSIS — G9341 Metabolic encephalopathy: Secondary | ICD-10-CM | POA: Diagnosis not present

## 2022-04-23 DIAGNOSIS — D126 Benign neoplasm of colon, unspecified: Secondary | ICD-10-CM

## 2022-04-23 DIAGNOSIS — R310 Gross hematuria: Secondary | ICD-10-CM | POA: Diagnosis not present

## 2022-04-23 DIAGNOSIS — J9601 Acute respiratory failure with hypoxia: Secondary | ICD-10-CM | POA: Diagnosis not present

## 2022-04-23 DIAGNOSIS — C20 Malignant neoplasm of rectum: Secondary | ICD-10-CM

## 2022-04-23 DIAGNOSIS — K922 Gastrointestinal hemorrhage, unspecified: Secondary | ICD-10-CM | POA: Diagnosis not present

## 2022-04-23 LAB — CBC WITH DIFFERENTIAL/PLATELET
Abs Immature Granulocytes: 0.06 10*3/uL (ref 0.00–0.07)
Basophils Absolute: 0 10*3/uL (ref 0.0–0.1)
Basophils Relative: 0 %
Eosinophils Absolute: 0.1 10*3/uL (ref 0.0–0.5)
Eosinophils Relative: 1 %
HCT: 25.2 % — ABNORMAL LOW (ref 39.0–52.0)
Hemoglobin: 7.5 g/dL — ABNORMAL LOW (ref 13.0–17.0)
Immature Granulocytes: 1 %
Lymphocytes Relative: 13 %
Lymphs Abs: 1.1 10*3/uL (ref 0.7–4.0)
MCH: 28.6 pg (ref 26.0–34.0)
MCHC: 29.8 g/dL — ABNORMAL LOW (ref 30.0–36.0)
MCV: 96.2 fL (ref 80.0–100.0)
Monocytes Absolute: 0.5 10*3/uL (ref 0.1–1.0)
Monocytes Relative: 6 %
Neutro Abs: 6.6 10*3/uL (ref 1.7–7.7)
Neutrophils Relative %: 79 %
Platelets: 148 10*3/uL — ABNORMAL LOW (ref 150–400)
RBC: 2.62 MIL/uL — ABNORMAL LOW (ref 4.22–5.81)
RDW: 15 % (ref 11.5–15.5)
WBC: 8.4 10*3/uL (ref 4.0–10.5)
nRBC: 0 % (ref 0.0–0.2)

## 2022-04-23 LAB — COMPREHENSIVE METABOLIC PANEL
ALT: 17 U/L (ref 0–44)
AST: 20 U/L (ref 15–41)
Albumin: 2.3 g/dL — ABNORMAL LOW (ref 3.5–5.0)
Alkaline Phosphatase: 52 U/L (ref 38–126)
Anion gap: 7 (ref 5–15)
BUN: 46 mg/dL — ABNORMAL HIGH (ref 8–23)
CO2: 27 mmol/L (ref 22–32)
Calcium: 9.3 mg/dL (ref 8.9–10.3)
Chloride: 100 mmol/L (ref 98–111)
Creatinine, Ser: 1.33 mg/dL — ABNORMAL HIGH (ref 0.61–1.24)
GFR, Estimated: 52 mL/min — ABNORMAL LOW (ref >60)
Glucose, Bld: 151 mg/dL — ABNORMAL HIGH (ref 70–99)
Potassium: 3.6 mmol/L (ref 3.5–5.1)
Sodium: 134 mmol/L — ABNORMAL LOW (ref 135–145)
Total Bilirubin: 0.9 mg/dL (ref 0.3–1.2)
Total Protein: 5.4 g/dL — ABNORMAL LOW (ref 6.5–8.1)

## 2022-04-23 LAB — PROTIME-INR
INR: 1.1 (ref 0.8–1.2)
Prothrombin Time: 14.2 seconds (ref 11.4–15.2)

## 2022-04-23 LAB — MAGNESIUM: Magnesium: 1.7 mg/dL (ref 1.7–2.4)

## 2022-04-23 MED ORDER — ENSURE ENLIVE PO LIQD
237.0000 mL | ORAL | Status: DC
  Administered 2022-04-26 – 2022-04-30 (×5): 237 mL via ORAL

## 2022-04-23 NOTE — Progress Notes (Signed)
Progress Note    Daniel Sharp   ZOX:[id number]  DOB: 01/28/1933  DOA: 04/11/2022     11 PCP: [name], MD  Initial CC: hematuria, melanotic stools, weakness  Hospital Course: Mr. 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  He was admitted on 04/11/22 with melanotic stools and gross hematuria.  He had been undergoing outpatient GI evaluation for EGD and colonoscopy. After admission he became grossly weaker with worsened respiratory status as well.  He was found to have hypercarbic respiratory failure and transferred to the ICU.  Pulmonology and neurology were also consulted.  He was placed on BiPAP with improvement in his mentation and respiratory status.  He was also noted to have volume overload and was started on diuresis, again with good response. He was not felt to warrant IVIG. He underwent brain imaging and was found to have [place], dural based mass in the right frontal lobe consistent with typical meningioma, and multiple small microhemorrhages consistent with cerebral amyloid angiopathy considered mild in severity. He was evaluated by neurosurgery due to these findings and recommended to have no further intervention or work-up for the meningioma/dural based mass.  SAH was also considered stable.  Interval History:  No events overnight. Son also present this morning. I discussed biopsy findings regarding adenoma and rectal adenocarcinoma.  They are very hopeful for an inpatient surgery workup especially as anticoagulation continues to be held.  Case also discussed with oncology who is aware of patient as well.   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  Mass partially circumferential and measuring at least 3 cm in length.  Oozing was noted to be present and evidence of 2 areas of clot.  This was felt to be the culprit for his rectal bleeding and will have ongoing risk for further bleeding until definitively treated.  Therefore in setting of ongoing anemia requiring blood transfusions, anticoagulation will continue to be held -biopsies have resulted, see path tab -Pelvic MRI shows 4.1 cm polypoid right anterior lower rectal mass extending to anorectal junction - surgery consulted to help facilitate next steps as well  - discussed with Dr. Irene Limbo on 10/17 as well; he's aware of patient - son and patient very interested and hopeful to have this pursued inpatient given his complexity with MG and clearance needed, esp with anticoag now on hold; patient has already been cleared as necessary by neurology (see last progress note from 10/12 and 10/13 from Dr. Curly Shores). Cardiology eval/clearance requested in case of surgery inpatient  Hematuria - resolved  BPH with LUTS RCC s/p left nephrectomy 01/22/22 - Appreciate urology evaluation - Some suspicion that his hematuria may be due to prostatic origin per urology - Continue tamsulosin - Finasteride added per urology - No further hematuria since 04/11/2022.  If develops again, will reconsult urology  Acute hypoxic hypercarbic respiratory failure-improving -Presumed multifactorial in setting of volume overload and hypercarbia.  There was some initial concern for contribution from myasthenia gravis as well. -Patient responded well to  diuresis and BiPAP use - appreciate pulmonology and neuro insight; patient considered appropriate for EGD/colonoscopy; if were to decompensate then intubation would be recommended - tolerated anesthesia well for EGD and c-scope on 10/13 -Continue weaning oxygen to off.  Turned down to 1 L, 27/74/1287  Acute metabolic encephalopathy - resolved  - seems labile at times  - due to hypercarbia and deconditioning; some component of hospital delirium and cannot fully rule out hypercalcemia playing a factor - now improved s/p BiPAP use - repeat ABG if further lethargy/somnolence  - Given good clinical response in general, hold off on palliative care consult at this time - see hypercalcemia  Hypercalcemia - improved  - some possible contribution to mentation from hyperCa - s/p zometa and calcitonin with improvement - mentation stable and no other worriesome features - monitoring Ca level further for now (PTH level normal)  CKD3a - patient has history of CKD3a. Baseline creat ~ 1.3 - 1.6 - possible contribution from L nephrectomy as well - at risk for ATN with hypoxia  - continue trending renal function   Acute on chronic dCHF -New echo during hospitalization; EF 55%, no RWMA; severe concentric LVH.  Indeterminate diastolic parameters.  Mild enlarged right ventricle -BNP 313; pulmonary crackles, respiratory distress/dyspnea/SOB, right pleural effusion - good response with lasix which is now on hold   Ocular myasthenia gravis - neurology following as well - continue NIFs as per neuro (change to TID) - continue prednisone and mestinon  -Caution with medications that can exacerbate - Neurology recommends elective intubation if were to worsen  SAH - stable Meningioma  -Appreciate neurosurgery input.  SAH stable and there are multiple small "microhemorrhages". This has been discussed with neurology (Dr. Curly Shores). The microhemorrhages are felt moreso related to long term anticoagulation since  2007 and less likely from [place] (as originally proposed from [place]). He is felt to NOT have contraindication to anticoagulation in this context   Culture-negative sepsis Possible hemorrhagic cystitis  Concern for HAP - initially treated with cefepime; discontinued after discussion with neurology as could also be playing a factor in mentation  - now afebrile and mild leukocytosis has normalized - cefepime discontinued on 10/11; keep off abx; discussed with PCCM  NSVT - asymptomatic - monitor electrolytes   PAF Hx MV repair - on chronic Coumadin -Anticoagulation currently on hold for above work-ups to be completed  HTN - continue current treatment  Old records reviewed in assessment of this patient  Antimicrobials: Rocephin 10/5 >>10/9 Cefepime 10/10 >> 10/11   DVT prophylaxis:  SCDs Start: 04/11/22 2325   Code Status:   Code Status: Partial Code  Mobility Assessment (last 72 hours)     Mobility Assessment     Row Name 04/23/22 1101 04/23/22 0800 04/22/22 2101 04/22/22 1000 04/21/22 2114   Does patient have an order for bedrest or is patient medically unstable -- No - Continue assessment No - Continue assessment No - Continue assessment No - Continue assessment   What is the highest level of mobility based on the progressive mobility assessment? Level 3 (Stands with assist) - Balance while standing  and cannot march in place Level 3 (Stands with assist) - Balance while standing  and cannot march in place -- Level 3 (Stands with assist) - Balance while standing  and cannot march  in place --   Is the above level different from baseline mobility prior to current illness? -- Yes - Recommend PT order Yes - Recommend PT order Yes - Recommend PT order Yes - Recommend PT order    [place] Name 04/21/22 1539 04/21/22 0830 04/20/22 2051       Does patient have an order for bedrest or is patient medically unstable -- No - Continue assessment No - Continue assessment     What is the highest  level of mobility based on the progressive mobility assessment? Level 4 (Walks with assist in room) - Balance while marching in place and cannot step forward and back - Complete Level 4 (Walks with assist in room) - Balance while marching in place and cannot step forward and back - Complete --     Is the above level different from baseline mobility prior to current illness? -- Yes - Recommend PT order Yes - Recommend PT order              Barriers to discharge:  Disposition Plan:  pending clinical course Status is: Inpt  Objective: Blood pressure 130/72, pulse 88, temperature 98.6 F (37 C), temperature source Oral, resp. rate 20, height 6' 0.5" (1.842 m), weight 87.9 kg, SpO2 98 %.  Examination:  Physical Exam Constitutional:      General: He is not in acute distress.    Appearance: Normal appearance.  HENT:     Head: Normocephalic and atraumatic.     Mouth/Throat:     Mouth: Mucous membranes are moist.  Eyes:     Extraocular Movements: Extraocular movements intact.     Comments: Mild left ptosis  Cardiovascular:     Rate and Rhythm: Normal rate and regular rhythm.     Heart sounds: Normal heart sounds.  Pulmonary:     Effort: Pulmonary effort is normal. No respiratory distress.     Breath sounds: No wheezing.  Abdominal:     General: Bowel sounds are normal. There is no distension.     Palpations: Abdomen is soft.     Tenderness: There is no abdominal tenderness.  Musculoskeletal:     Cervical back: Normal range of motion and neck supple.     Comments: Trace L>R LE edema  Skin:    General: Skin is warm and dry.  Neurological:     Mental Status: He is alert.     Comments: Generalized weakness, worse in LE.  Psychiatric:        Mood and Affect: Mood normal.        Behavior: Behavior normal.      Consultants:  PCCM - peripheral following  Urology - signed off GI - signed off Neurosurgery - signed off  Procedures:  10/13: EGD/colonoscopy  Data  Reviewed: Results for orders placed or performed during the hospital encounter of 04/11/22 (from the past 24 hour(s))  Protime-INR     Status: None   Collection Time: 04/23/22  4:56 AM  Result Value Ref Range   Prothrombin Time 14.2 11.4 - 15.2 seconds   INR 1.1 0.8 - 1.2  Comprehensive metabolic panel     Status: Abnormal   Collection Time: 04/23/22  4:56 AM  Result Value Ref Range   Sodium 134 (L) 135 - 145 mmol/L   Potassium 3.6 3.5 - 5.1 mmol/L   Chloride 100 98 - 111 mmol/L   CO2 27 22 - 32 mmol/L   Glucose, Bld 151 (H) 70 - 99 mg/dL   BUN  46 (H) 8 - 23 mg/dL   Creatinine, Ser 1.33 (H) 0.61 - 1.24 mg/dL   Calcium 9.3 8.9 - 10.3 mg/dL   Total Protein 5.4 (L) 6.5 - 8.1 g/dL   Albumin 2.3 (L) 3.5 - 5.0 g/dL   AST 20 15 - 41 U/L   ALT 17 0 - 44 U/L   Alkaline Phosphatase 52 38 - 126 U/L   Total Bilirubin 0.9 0.3 - 1.2 mg/dL   GFR, Estimated 52 (L) >60 mL/min   Anion gap 7 5 - 15  Magnesium     Status: None   Collection Time: 04/23/22  4:56 AM  Result Value Ref Range   Magnesium 1.7 1.7 - 2.4 mg/dL  CBC with Differential/Platelet     Status: Abnormal   Collection Time: 04/23/22  4:56 AM  Result Value Ref Range   WBC 8.4 4.0 - 10.5 K/uL   RBC 2.62 (L) 4.22 - 5.81 MIL/uL   Hemoglobin 7.5 (L) 13.0 - 17.0 g/dL   HCT 25.2 (L) 39.0 - 52.0 %   MCV 96.2 80.0 - 100.0 fL   MCH 28.6 26.0 - 34.0 pg   MCHC 29.8 (L) 30.0 - 36.0 g/dL   RDW 15.0 11.5 - 15.5 %   Platelets 148 (L) 150 - 400 K/uL   nRBC 0.0 0.0 - 0.2 %   Neutrophils Relative % 79 %   Neutro Abs 6.6 1.7 - 7.7 K/uL   Lymphocytes Relative 13 %   Lymphs Abs 1.1 0.7 - 4.0 K/uL   Monocytes Relative 6 %   Monocytes Absolute 0.5 0.1 - 1.0 K/uL   Eosinophils Relative 1 %   Eosinophils Absolute 0.1 0.0 - 0.5 K/uL   Basophils Relative 0 %   Basophils Absolute 0.0 0.0 - 0.1 K/uL   Immature Granulocytes 1 %   Abs Immature Granulocytes 0.06 0.00 - 0.07 K/uL    I have Reviewed nursing notes, Vitals, and Lab results since pt's  last encounter. Pertinent lab results : see above I have ordered test including BMP, CBC, Mg I have reviewed the last note from staff over past 24 hours I have discussed pt's care plan and test results with nursing staff, case manager   LOS: 11 days   Dwyane Dee, MD [facility] 04/23/2022, 2:15 PM

## 2022-04-23 NOTE — Progress Notes (Signed)
Occupational Therapy Treatment Patient Details Name: Daniel Sharp MRN: [id number] DOB: [birth year] Today's Date: 04/23/2022   History of present illness Patient is a 88 y.o. male presenting to [facility] ED on 10/5 with hematuria, subacute upper GI bleed and sepsis due to hemorrhagic cystitis. PMHx significant for L renal cell carcinoma s/p L nephrectomy 01/22/22 (did not undergo chemo or radiation), Afib, OA, HTN, MG, back surgery, cholecystectomy, appendectomy, chronic iron deficiency, and recent fall with dislocated Rt finger, fractured Rt foot, Rt 5th rib fracture. Rapid response on 10/9 for decreased responses. He underwent brain imaging and was found to have [place], dural based mass in the right frontal lobe consistent with typical meningioma, and multiple small microhemorrhages consistent with cerebral amyloid angiopathy considered mild in severity. He was evaluated by neurosurgery due to these findings and recommended to have no further intervention or work-up for the meningioma/dural based mass.  SAH was also considered stable.   OT comments  Patient was noted to have leaning to R side with fatigue with multiple attempts standing at EOB with RW. Patient was able to maintain O2 saturation on 2L/min with movement. Patient did has some dizziness on EOB with BP of 129/73 mmhg with MAP of 89. Patient would continue to benefit from skilled OT services at this time while admitted and after d/c to address noted deficits in order to improve overall safety and independence in ADLs.     Recommendations for follow up therapy are one component of a multi-disciplinary discharge planning process, led by the attending physician.  Recommendations may be updated based on patient status, additional functional criteria and insurance authorization.    Follow Up Recommendations  Skilled nursing-short term rehab (<3 hours/day)    Assistance Recommended at Discharge Frequent or constant Supervision/Assistance  Patient can  return home with the following  Two people to help with walking and/or transfers;A lot of help with bathing/dressing/bathroom;Assistance with cooking/housework;Assistance with feeding;Direct supervision/assist for medications management;Direct supervision/assist for financial management;Assist for transportation;Help with stairs or ramp for entrance   Equipment Recommendations  Other (comment) (defer to next venue)    Recommendations for Other Services      Precautions / Restrictions Precautions Precautions: Fall Precaution Comments: monitor O2 Restrictions Weight Bearing Restrictions: No       Mobility Bed Mobility Overal bed mobility: Needs Assistance Bed Mobility: Supine to Sit     Supine to sit: Mod assist          Transfers Overall transfer level: Needs assistance Equipment used: Rolling walker (2 wheels) Transfers: Sit to/from Stand, Bed to chair/wheelchair/BSC Sit to Stand: Mod assist, +2 safety/equipment           General transfer comment: with multiple times to complete sit to stand with max A with +2 needed to complete transfer with increased time.     Balance Overall balance assessment: Needs assistance Sitting-balance support: Bilateral upper extremity supported, Feet supported Sitting balance-Leahy Scale: Poor   Postural control: Right lateral lean                                 ADL either performed or assessed with clinical judgement   ADL Overall ADL's : Needs assistance/impaired                           Toilet Transfer Details (indicate cue type and reason): patient was mod A and max A with +2 support  to transfer from edge of bed to recliner in room. patient was able to maitnain O2 saturation on 2L/min with transfer.           General ADL Comments: patient was noted to lean to the R side sitting on EOB with fatigue with patient reporting increased fatigue.    Extremity/Trunk Assessment              Vision        Perception     Praxis      Cognition Arousal/Alertness: Awake/alert Behavior During Therapy: Flat affect Overall Cognitive Status: Impaired/Different from baseline                                 General Comments: patient needed increased time to respond to questions but was appropirate during session.        Exercises      Shoulder Instructions       General Comments      Pertinent Vitals/ Pain       Pain Assessment Pain Assessment: No/denies pain  Home Living                                          Prior Functioning/Environment              Frequency  Min 2X/week        Progress Toward Goals  OT Goals(current goals can now be found in the care plan section)  Progress towards OT goals: Progressing toward goals     Plan Discharge plan remains appropriate    Co-evaluation                 AM-PAC OT "6 Clicks" Daily Activity     Outcome Measure     Help from another person taking care of personal grooming?: A Lot Help from another person toileting, which includes using toliet, bedpan, or urinal?: Total Help from another person bathing (including washing, rinsing, drying)?: Total Help from another person to put on and taking off regular upper body clothing?: A Lot Help from another person to put on and taking off regular lower body clothing?: Total 6 Click Score: 7    End of Session Equipment Utilized During Treatment: Gait belt;Rolling walker (2 wheels);Oxygen  OT Visit Diagnosis: Muscle weakness (generalized) (M62.81);Other symptoms and signs involving cognitive function   Activity Tolerance Patient tolerated treatment well   Patient Left with call bell/phone within reach;in chair;with chair alarm set   Nurse Communication Mobility status        Time: 2595-6387 OT Time Calculation (min): 34 min  Charges: OT General Charges $OT Visit: 1 Visit OT Treatments $Self Care/Home Management :  23-37 mins  Daniel Plowman, MS Acute Rehabilitation Department Office# [phone]   Daniel Sharp Daniel Sharp 04/23/2022, 11:04 AM

## 2022-04-23 NOTE — Progress Notes (Signed)
NIF _40 VC 1.0L Pt had good effort compared to this morning.

## 2022-04-23 NOTE — Progress Notes (Signed)
Patient performed vital capacity and negative inspiratory force.  Results as follows:  NIF= >-40 cm H2O (for all three attempts) VC= 1.1L (average of three attempts)

## 2022-04-23 NOTE — Progress Notes (Signed)
NIF -40 Poor effort at doing VC

## 2022-04-23 NOTE — TOC Progression Note (Signed)
Transition of Care [facility]) - Progression Note    Patient Details  Name: Daniel Sharp MRN: [id number] Date of Birth: 1932-09-27  Transition of [facility] CM/SW Contact  Adrean Findlay, Juliann Pulse, RN Phone Number: 04/23/2022, 1:01 PM  Clinical Narrative:[facility] rep Bryson Corona accepted-she will initiate auth.Noted awaiting to confirm if surgery needed.       Expected Discharge Plan: Skilled Nursing Facility Barriers to Discharge: Insurance Authorization  Expected Discharge Plan and Services Expected Discharge Plan: [place]       Living arrangements for the past 2 months: Single Family Home                                       Social Determinants of Health (SDOH) Interventions    Readmission Risk Interventions     No data to display

## 2022-04-23 NOTE — Progress Notes (Signed)
Nutrition Follow-up  DOCUMENTATION CODES:  Not applicable  INTERVENTION:  -Continue heart healthy diet as tolerated, consider liberalizing to regular if po intake is poor -Provide Ensure plus HP q day per Daniel Sharp preference (350kcal, 20g protein)  NUTRITION DIAGNOSIS:  Inadequate oral intake related to other (see comment) (difficulty breathing and AMS) as evidenced by NPO status.  GOAL:  Patient will meet greater than or equal to 90% of their needs  MONITOR:  Diet advancement  REASON FOR ASSESSMENT:  Malnutrition Screening Tool    ASSESSMENT:  Daniel Sharp is an 88yo M with PMH of HTN, A fib, chronic blood loss anemia, kidney cancer s/p L nephrectomy, chronic right sided heart failure, BPH, CKD 3, ocular myasthenia gravis, and arthritis who presents with micro hematuria and dark colored stool.  Per chart review, Daniel Sharp had biopsy during admission that showed adenoma and rectal adenocarcinoma. Anticoagulation being held for possible surgical interventions during admission. Visited Daniel Sharp at bedside this afternoon to discuss nutrition. He reports eating aboyt 4oz of food TID and drinking Premier Protein Shakes at least daily. Denies following a special diet at home but says he doesn't over do it on the fat. Recorded po intake during admission varying from 0-100% in the last several days. Daniel Sharp reports a UBW of 198# (90kg) about 2 months ago, CBW 87.9kg shows an undesirable but not significant 2.3% weight loss. Daniel Sharp has some mild mild muscle depletion, does not meet ASPEN criteria for malnutrition at this time.   Daniel Sharp's son has been bringing in ONS (170kcal, 30g protein), offered alternative higher calorie ONS during admission. Recommend Ensure Plus HP q day to provide 350kcal and 20g protein/bottle. Daniel Sharp says he drinks them when he's awake early in the morning before the kitchen can bring him up food. Daniel Sharp denies difficulty with menu options on heart healthy diet, will continue at this time.  *Note allergy to magnesium  containing compunds. Daniel Sharp has Premier Protein Shakes at bedside and drinks them daily. Premier Protein and Ensure Plus HP contain same Mg content  NUTRITION - FOCUSED PHYSICAL EXAM:  Flowsheet Row Most Recent Value  Orbital Region Mild depletion  Upper Arm Region No depletion  Thoracic and Lumbar Region No depletion  Buccal Region No depletion  Temple Region Moderate depletion  Clavicle Bone Region Mild depletion  Clavicle and Acromion Bone Region No depletion  Scapular Bone Region Unable to assess  Dorsal Hand Mild depletion  Hair Reviewed  Eyes Reviewed  Mouth Reviewed  Skin Reviewed  Nails Reviewed      Diet Order:   Diet Order             Diet Heart Room service appropriate? Yes; Fluid consistency: Thin  Diet effective now                   EDUCATION NEEDS:   Not appropriate for education at this time  Skin:  Skin Assessment: Skin Integrity Issues: Skin Integrity Issues:: DTI, Stage I DTI: coccyx Stage I: right heel  Last BM:  10/15  Height:   Ht Readings from Last 1 Encounters:  04/11/22 6' 0.5" (1.842 m)   Weight:  Wt Readings from Last 1 Encounters:  04/22/22 87.9 kg   BMI:  Body mass index is 25.92 kg/m.  Estimated Nutritional Needs:  Kcal:  4709-6283MOQH  Protein:  105-130g  Fluid:  1959m   KCandise Bowens MS, RD, LDN, CNSC See AMiON for contact information

## 2022-04-23 NOTE — Progress Notes (Addendum)
HEMATOLOGY/ONCOLOGY INPATIENT NOTE  Date of Service: 04/23/2022  Patient Care Team: [name], MD as PCP - General (Family Medicine) Belva Crome, MD as PCP - Cardiology (Cardiology) Alda Berthold, DO as Consulting Physician (Neurology)  CHIEF COMPLAINTS/PURPOSE OF CONSULTATION:  Evaluation and consultation for newly diagnosed rectal and colon cancer.  HISTORY OF PRESENTING ILLNESS:   Daniel Sharp has been referred to [place] by Dr. Ozella Rocks for evaluation and management of newly diagnosed colon cancer and rectal cancer.  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  Patient presented to the emergency room on 04/11/2022 with melanotic stools and gross hematuria along with increased fatigue and weakness. Patient has had issues with hypercapnic respiratory failure needing BiPAP.  Pulmonology and neurology were consulted.  He was thought to have fluid overload. He also was noted to have subarachnoid hemorrhage thought to be related to cerebral amyloid angiopathy.  This was evaluated neurosurgery and no surgical intervention was recommended.  Patient had EGD and colonoscopy on 04/19/2022.  For evaluation of melanotic stools by Dr. Rush Landmark. Colonoscopy showed Palpable rectal mass and hemorrhoids found on digital rectal exam. - Stool in the entire examined colon - lavaged with adequate visualization. - The examined portion of the ileum was normal. - 12, 3 to 10 mm polyps in the rectum, in the descending colon, in the transverse colon, in the ascending colon and in the cecum, removed with a cold snare. Resected and retrieved. - One 35 mm polyp in the proximal descending colon, removed with piecemeal mucosal resection. Resected and retrieved. Treated with STSC. Clips (MR conditional)  were placed. Tattooed. - Rule out malignancy, tumor in the distal rectum involving the dentate line. Biopsied. - Diverticulosis of the sigmoid and rectosigmoid colon. - Non-bleeding non-thrombosed external and internal hemorrhoids.  EGD showed 3 cm hiatal hernia but no other overt lesions that could bleed.  Pathology results from colonoscopy biopsies showed rectal mass at least intramucosal adenocarcinoma arising in a background of high-grade dysplasia.  Descending colon resected polyp show at least intramucosal adenocarcinoma arising in a background of high-grade dysplasia.  Fragments of tubular adenoma with high-grade dysplasia from ascending colon   He notes his son Daniel Sharp is his primary contact. Phone number is [phone]. He lives with his son.  He reports darker urine that is more of an orange color.  He notes he is not eating much.  He notes loose stools.  We discussed various options but that we would need to hold off making any treatment related decisions pending surgical consult for consideration of tumor resection. He is agreeable to this.  He notes he does not walk without support. He uses a walker.  MRI pelvis wo cm rectal ca staging scan reviewed in detail.  Recent labs and scans were reviewed in detail.  MEDICAL HISTORY:  Past Medical History:  Diagnosis Date   A-fib [facility])    history of   Arthritis    Dyspnea    H/O mitral valve repair 07/2003   Hypertension    Ocular myasthenia gravis ([place])    takes pred   Pneumonia     SURGICAL HISTORY: Past Surgical History:  Procedure Laterality Date   APPENDECTOMY     BACK SURGERY     cyst removed L3 and L4   BIOPSY  04/19/2022   Procedure: BIOPSY;  Surgeon:  Mansouraty, Telford Nab., MD;  Location: [facility];  Service: Gastroenterology;;   CHOLECYSTECTOMY     COLONOSCOPY WITH PROPOFOL N/A 04/19/2022   Procedure: COLONOSCOPY WITH PROPOFOL;  Surgeon: Irving Copas., MD;  Location: [facility];   Service: Gastroenterology;  Laterality: N/A;   ENDOSCOPIC MUCOSAL RESECTION  04/19/2022   Procedure: ENDOSCOPIC MUCOSAL RESECTION;  Surgeon: Rush Landmark Telford Nab., MD;  Location: [facility];  Service: Gastroenterology;;   ESOPHAGOGASTRODUODENOSCOPY (EGD) WITH PROPOFOL N/A 04/19/2022   Procedure: ESOPHAGOGASTRODUODENOSCOPY (EGD) WITH PROPOFOL;  Surgeon: Irving Copas., MD;  Location: [facility];  Service: Gastroenterology;  Laterality: N/A;   FINGER SURGERY Right    R hand   HEMOSTASIS CLIP PLACEMENT  04/19/2022   Procedure: HEMOSTASIS CLIP PLACEMENT;  Surgeon: Irving Copas., MD;  Location: [facility];  Service: Gastroenterology;;   MITRAL VALVE REPAIR     on coumadin   NOSE SURGERY     POLYPECTOMY  04/19/2022   Procedure: POLYPECTOMY;  Surgeon: Rush Landmark Telford Nab., MD;  Location: [facility];  Service: Gastroenterology;;   ROBOTIC ASSITED PARTIAL NEPHRECTOMY Left 01/22/2022   Procedure: XI ROBOTIC ASSITED NEPHRECTOMY;  Surgeon: Ceasar Mons, MD;  Location: [facility];  Service: Urology;  Laterality: Left;   SUBMUCOSAL LIFTING INJECTION  04/19/2022   Procedure: SUBMUCOSAL LIFTING INJECTION;  Surgeon: Rush Landmark Telford Nab., MD;  Location: [facility];  Service: Gastroenterology;;   SUBMUCOSAL TATTOO INJECTION  04/19/2022   Procedure: SUBMUCOSAL TATTOO INJECTION;  Surgeon: Irving Copas., MD;  Location: [facility];  Service: Gastroenterology;;    SOCIAL HISTORY: Social History   Socioeconomic History   Marital status: Widowed    Spouse name: Not on file   Number of children: 2   Years of education: Not on file   Highest education level: Not on file  Occupational History   Not on file  Tobacco Use   Smoking status: Former    Types: Cigarettes, Pipe, Cigars   Smokeless tobacco: Former    Types: Chew   Tobacco comments:    Uses nicotine gum   Vaping Use   Vaping Use: Never used  Substance and Sexual Activity   Alcohol use: Yes     Alcohol/week: 7.0 standard drinks of alcohol    Types: 7 Glasses of wine per week    Comment: Drinks wine when sons will let him   Drug use: Never   Sexual activity: Not on file  Other Topics Concern   Not on file  Social History Narrative   Lives in [place], [place].  Son lives in [place]      [place]    Lives in a one story home with a basement.    Lives at [facility] at this time   Social Determinants of Health   Financial Resource Strain: Not on file  Food Insecurity: No Food Insecurity (04/12/2022)   Hunger Vital Sign    Worried About Running Out of Food in the Last Year: Never true    Ran Out of Food in the Last Year: Never true  Transportation Needs: No Transportation Needs (04/12/2022)   PRAPARE - Hydrologist (Medical): No    Lack of Transportation (Non-Medical): No  Physical Activity: Not on file  Stress: Not on file  Social Connections: Not on file  Intimate Partner Violence: Not At Risk (04/12/2022)   Humiliation, Afraid, Rape, and Kick questionnaire    Fear of Current or Ex-Partner: No    Emotionally Abused: No    Physically  Abused: No    Sexually Abused: No    FAMILY HISTORY: Family History  Problem Relation Age of Onset   Other Mother        Gallbladder removed   Stroke Father        intracranial stroke   Stroke Brother    Colon cancer Neg Hx    Rectal cancer Neg Hx     ALLERGIES:  is allergic to aminoglycosides, botox [onabotulinumtoxina], corticosteroids, hydroxychloroquine, levaquin [levofloxacin], macrolides and ketolides, magnesium-containing compounds, neuromuscular blocking agents, penicillamine, procainamide, and quinidine.  MEDICATIONS:  Current Facility-Administered Medications  Medication Dose Route Frequency Provider Last Rate Last Admin   acetaminophen (TYLENOL) tablet 650 mg  650 mg Oral Q6H PRN Dwyane Dee, MD   650 mg at 04/15/22 1429   Or   acetaminophen (TYLENOL) suppository 650 mg  650 mg  Rectal Q6H PRN Dwyane Dee, MD       finasteride (PROSCAR) tablet 5 mg  5 mg Oral Daily Dwyane Dee, MD   5 mg at 04/23/22 0827   Oral care mouth rinse  15 mL Mouth Rinse 4 times per day Dwyane Dee, MD   15 mL at 04/23/22 8250   Oral care mouth rinse  15 mL Mouth Rinse PRN Dwyane Dee, MD       pantoprazole (PROTONIX) EC tablet 40 mg  40 mg Oral BID Adrian Saran, RPH   40 mg at 04/23/22 0827   predniSONE (DELTASONE) tablet 10 mg  10 mg Oral Daily Dwyane Dee, MD   10 mg at 04/23/22 0827   pyridostigmine (MESTINON) injection 2 mg  2 mg Intravenous QID Dwyane Dee, MD   2 mg at 04/21/22 1344   Or   pyridostigmine (MESTINON) tablet 60 mg  60 mg Oral QID Dwyane Dee, MD   60 mg at 04/23/22 0827   tamsulosin (FLOMAX) capsule 0.4 mg  0.4 mg Oral Daily Dwyane Dee, MD   0.4 mg at 04/23/22 0827    REVIEW OF SYSTEMS:    10 Point review of Systems was done is negative except as noted above.  PHYSICAL EXAMINATION: ECOG PERFORMANCE STATUS: 3 - Symptomatic, >50% confined to bed  . Vitals:   04/23/22 0412 04/23/22 1147  BP: 121/62 130/72  Pulse: 76 88  Resp: 20 20  Temp: 98.8 F (37.1 C) 98.6 F (37 C)  SpO2: 97% 98%   Filed Weights   04/20/22 0500 04/21/22 0544 04/22/22 0500  Weight: 182 lb 15.7 oz (83 kg) 183 lb 13.8 oz (83.4 kg) 193 lb 12.8 oz (87.9 kg)   .Body mass index is 25.92 kg/m. NAD GENERAL:alert, in no acute distress and comfortable SKIN: no acute rashes, no significant lesions EYES: conjunctiva are pink and non-injected, sclera anicteric NECK: supple, no JVD LYMPH:  no palpable lymphadenopathy in the cervical, axillary or inguinal regions LUNGS: clear to auscultation b/l with normal respiratory effort HEART: regular rate & rhythm ABDOMEN:  normoactive bowel sounds , non tender, not distended. Extremity: 1+ pedal edema b/l PSYCH: alert & oriented x 3 with fluent speech NEURO: no focal motor/sensory deficits  Exam performed in  chair.  LABORATORY DATA:  I have reviewed the data as listed  .    Latest Ref Rng & Units 04/23/2022    4:56 AM 04/22/2022    4:18 AM 04/21/2022    5:02 AM  CBC  WBC 4.0 - 10.5 K/uL 8.4  11.2  10.0   Hemoglobin 13.0 - 17.0 g/dL 7.5  7.6  7.7  Hematocrit 39.0 - 52.0 % 25.2  25.0  25.6   Platelets 150 - 400 K/uL 148  150  171     .    Latest Ref Rng & Units 04/23/2022    4:56 AM 04/22/2022    4:18 AM 04/21/2022    5:02 AM  CMP  Glucose 70 - 99 mg/dL 151  100  98   BUN 8 - 23 mg/dL 46  46  41   Creatinine 0.61 - 1.24 mg/dL 1.33  1.27  1.39   Sodium 135 - 145 mmol/L 134  135  139   Potassium 3.5 - 5.1 mmol/L 3.6  4.3  4.2   Chloride 98 - 111 mmol/L 100  101  102   CO2 22 - 32 mmol/L 27  31  33   Calcium 8.9 - 10.3 mg/dL 9.3  9.7  10.4   Total Protein 6.5 - 8.1 g/dL 5.4  5.9  6.0   Total Bilirubin 0.3 - 1.2 mg/dL 0.9  1.2  0.9   Alkaline Phos 38 - 126 U/L 52  63  56   AST 15 - 41 U/L _0 ALT 0 - 44 U/L _1 SURGICAL PATHOLOGY  CASE: [id number]  PATIENT: Daniel Sharp  Surgical Pathology Report      Clinical History: Iron deficiency anemia (crm)      FINAL MICROSCOPIC DIAGNOSIS:   A. STOMACH, BIOPSY:  - Gastric antral and oxyntic mucosa with no specific histopathologic  changes  - Helicobacter pylori-like organisms are not identified on routine HE  stain   B. DUODENUM, BIOPSY:  - Duodenal mucosa with no specific histopathologic changes  - Negative for increased intraepithelial lymphocytes or villous  architectural changes   C. COLON, CECUM, ASCENDING, TRANSVERSE, DESCENDING, POLYPECTOMY:  - Fragments of tubular adenoma(s) with high-grade dysplasia  - Sessile serrated polyp(s) without cytologic dysplasia   D. COLON, DESCENDING, RECTUM, POLYPECTOMY:  - At least intramucosal adenocarcinoma arising in a background of  high-grade dysplasia, see comment   E. RECTAL MASS, BIOPSY:  - At least intramucosal adenocarcinoma arising in a  background of  high-grade dysplasia, see comment       COMMENT:   D and E.  Dr. Arby Barrette reviewed the case and concurs with the above  diagnosis.  Dr. Rush Landmark was notified on 04/22/2022.  Immunohistochemical stains for MMR-related proteins are pending and will  be reported in an addendum.    RADIOGRAPHIC STUDIES: I have personally reviewed the radiological images as listed and agreed with the findings in the report. MR PELVIS WO CM RECTAL CA STAGING  Result Date: 04/21/2022 CLINICAL DATA:  Rectal cancer staging EXAM: MRI PELVIS WITHOUT CONTRAST TECHNIQUE: Multiplanar multisequence MR imaging of the pelvis was performed. No intravenous contrast was administered. Ultrasound gel was administered per rectum to optimize tumor evaluation. COMPARISON:  CT abdomen/pelvis dated 04/11/2022 FINDINGS: TUMOR LOCATION Tumor distance from Anal Verge/Skin Surface: 4.0 cm Tumor distance to Internal Anal Sphincter: 0 cm TUMOR DESCRIPTION Circumferential Extent: Right anterior, extending from 11:00 to 2:00 (series 8/image 23) Tumor Length: 4.1 cm T - CATEGORY Extension through Muscularis Propria: No=T2 Shortest Distance of any tumor/node from Mesorectal Fascia: 4 mm Extramural Vascular Invasion/Tumor Thrombus: No Invasion of Anterior Peritoneal Reflection: No Involvement of Adjacent Organs or Pelvic Sidewall: No Levator Ani Involvement: No N - CATEGORY Mesorectal Lymph Nodes >=50m: None=N0 Extra-mesorectal Lymphadenopathy: No Other:  None. IMPRESSION: 4.1 cm polypoid right anterior low rectal  mass extending to the anorectal junction, as described above. Assuming pathology returns a diagnosis of rectal adenocarcinoma, MR staging is provided below. Rectal adenocarcinoma T stage: T2 Rectal adenocarcinoma N stage:  N0 Distance from tumor to the internal anal sphincter is 0 cm. Electronically Signed   By: Julian Hy M.D.   On: 04/21/2022 17:15   DG CHEST PORT 1 VIEW  Result Date: 04/19/2022 CLINICAL DATA:   Acute hypoxic respiratory failure. EXAM: PORTABLE CHEST 1 VIEW COMPARISON:  One-view chest x-ray 04/17/2022. CT of the chest 04/16/2022. FINDINGS: The heart is enlarged. Aeration of both lungs continues to improve. Interstitial and airspace opacities remain worse right than left. Effusions are significantly improved. IMPRESSION: 1. Improving aeration and effusions. 2. Persistent interstitial and airspace opacities remain worse right than left. Electronically Signed   By: San Morelle M.D.   On: 04/19/2022 06:59   DG CHEST PORT 1 VIEW  Result Date: 04/17/2022 CLINICAL DATA:  Shortness of breath EXAM: PORTABLE CHEST 1 VIEW COMPARISON:  Chest CT April 16, 2022 and chest radiograph April 16, 2022. FINDINGS: The heart size and mediastinal contours are enlarged, unchanged. Prior median sternotomy. Aortic atherosclerosis. Mitral valve replacement. Similar veiling bilateral opacities reflecting layering pleural effusions and adjacent atelectasis/consolidation. Bilateral interstitial opacities are also unchanged which may reflect infection or edema. No acute osseous abnormality IMPRESSION: Similar bilateral pleural effusions with adjacent infiltrate or atelectasis. Stable cardiomegaly with bilateral interstitial opacities favor pulmonary edema. Electronically Signed   By: Dahlia Bailiff M.D.   On: 04/17/2022 08:30   ECHOCARDIOGRAM COMPLETE  Result Date: 04/16/2022    ECHOCARDIOGRAM REPORT   Patient Name:   Daniel Sharp Date of Exam: 04/16/2022 Medical Rec #:  [id number]   Height:       72.5 in Accession #:    [id number]  Weight:       189.4 lb Date of Birth:  1933/03/24   BSA:          2.092 m Patient Age:    42 years    BP:           134/63 mmHg Patient Gender: M           HR:           77 bpm. Exam Location:  Inpatient Procedure: 2D Echo Indications:    Cardiomyopathy  History:        Patient has prior history of Echocardiogram examinations, most                 recent 02/14/2022. Cardiomyopathy, MV  repair, Arrythmias:Atrial                 Fibrillation, Signs/Symptoms:Edema; Risk Factors:Hypertension.  Sonographer:    Harvie Junior Referring Phys: [place]  1. Left ventricular ejection fraction, by estimation, is 55%. The left ventricle has low normal function. The left ventricle has no regional wall motion abnormalities. There is severe concentric left ventricular hypertrophy. Left ventricular diastolic parameters are indeterminate.  2. Right ventricular systolic function is normal. The right ventricular size is mildly enlarged. There is normal pulmonary artery systolic pressure.  3. Left atrial size was moderately dilated.  4. Large pleural effusion in the left lateral region.  5. The mitral valve has been repaired/replaced. Mild to moderate mitral valve regurgitation and eccentric, best seen in subcostal views  6. The aortic valve is tricuspid. Aortic valve regurgitation is mild. Comparison(s): Similar MR; has had intermittent atrial fibrillation through this study. FINDINGS  Left Ventricle: Left  ventricular ejection fraction, by estimation, is 55%. The left ventricle has low normal function. The left ventricle has no regional wall motion abnormalities. The left ventricular internal cavity size was normal in size. There is severe concentric left ventricular hypertrophy. Left ventricular diastolic parameters are indeterminate. Right Ventricle: The right ventricular size is mildly enlarged. No increase in right ventricular wall thickness. Right ventricular systolic function is normal. There is normal pulmonary artery systolic pressure. The tricuspid regurgitant velocity is 2.74  m/s, and with an assumed right atrial pressure of 3 mmHg, the estimated right ventricular systolic pressure is 42.7 mmHg. Left Atrium: Left atrial size was moderately dilated. Right Atrium: Right atrial size was normal in size. Pericardium: Trivial pericardial effusion is present. Mitral Valve: The mitral valve  has been repaired/replaced. Mild to moderate mitral valve regurgitation. Tricuspid Valve: The tricuspid valve is normal in structure. Tricuspid valve regurgitation is mild . No evidence of tricuspid stenosis. Aortic Valve: The aortic valve is tricuspid. Aortic valve regurgitation is mild. Aortic regurgitation PHT measures 680 msec. Aortic valve mean gradient measures 4.0 mmHg. Aortic valve peak gradient measures 7.5 mmHg. Aortic valve area, by VTI measures 3.38 cm. Pulmonic Valve: The pulmonic valve was normal in structure. Pulmonic valve regurgitation is not visualized. No evidence of pulmonic stenosis. Aorta: The aortic root and ascending aorta are structurally normal, with no evidence of dilitation. IAS/Shunts: No atrial level shunt detected by color flow Doppler. Additional Comments: There is a large pleural effusion in the left lateral region.  LEFT VENTRICLE PLAX 2D LVIDd:         4.80 cm      Diastology LVIDs:         3.50 cm      LV e' medial:    5.98 cm/s LV PW:         1.40 cm      LV E/e' medial:  21.1 LV IVS:        1.60 cm      LV e' lateral:   11.50 cm/s LVOT diam:     2.30 cm      LV E/e' lateral: 11.0 LV SV:         61 LV SV Index:   29 LVOT Area:     4.15 cm  LV Volumes (MOD) LV vol d, MOD A2C: 146.0 ml LV vol d, MOD A4C: 115.0 ml LV vol s, MOD A2C: 53.5 ml LV vol s, MOD A4C: 47.2 ml LV SV MOD A2C:     92.5 ml LV SV MOD A4C:     115.0 ml LV SV MOD BP:      79.5 ml RIGHT VENTRICLE RV Basal diam:  4.20 cm RV Mid diam:    3.70 cm RV S prime:     23.20 cm/s TAPSE (M-mode): 1.4 cm LEFT ATRIUM              Index        RIGHT ATRIUM           Index LA diam:        4.20 cm  2.01 cm/m   RA Area:     20.60 cm LA Vol (A2C):   111.0 ml 53.05 ml/m  RA Volume:   63.00 ml  30.11 ml/m LA Vol (A4C):   78.3 ml  37.42 ml/m LA Biplane Vol: 95.7 ml  45.74 ml/m  AORTIC VALVE  PULMONIC VALVE AV Area (Vmax):    3.43 cm     PV Vmax:       1.36 m/s AV Area (Vmean):   3.14 cm     PV Peak grad:  7.4  mmHg AV Area (VTI):     3.38 cm AV Vmax:           137.00 cm/s AV Vmean:          98.600 cm/s AV VTI:            0.182 m AV Peak Grad:      7.5 mmHg AV Mean Grad:      4.0 mmHg LVOT Vmax:         113.00 cm/s LVOT Vmean:        74.600 cm/s LVOT VTI:          0.148 m LVOT/AV VTI ratio: 0.81 AI PHT:            680 msec  AORTA Ao Root diam: 3.70 cm Ao Asc diam:  3.50 cm MITRAL VALVE                TRICUSPID VALVE MV Area (PHT): 4.06 cm     TR Peak grad:   30.0 mmHg MV Decel Time: 187 msec     TR Vmax:        274.00 cm/s MR Peak grad: 99.9 mmHg MR Vmax:      499.67 cm/s   SHUNTS MV E velocity: 126.00 cm/s  Systemic VTI:  0.15 m MV A velocity: 94.00 cm/s   Systemic Diam: 2.30 cm MV E/A ratio:  1.34 Rudean Haskell MD Electronically signed by Rudean Haskell MD Signature Date/Time: 04/16/2022/3:37:21 PM    Final    CT HEAD WO CONTRAST (5MM)  Result Date: 04/16/2022 CLINICAL DATA:  Provided history: Subarachnoid hemorrhage. EXAM: CT HEAD WITHOUT CONTRAST TECHNIQUE: Contiguous axial images were obtained from the base of the skull through the vertex without intravenous contrast. RADIATION DOSE REDUCTION: This exam was performed according to the departmental dose-optimization program which includes automated exposure control, adjustment of the mA and/or kV according to patient size and/or use of iterative reconstruction technique. COMPARISON:  Brain MRI 04/16/2022. Non-contrast head CT 04/15/2022. CT angiogram head/neck 04/15/2022. FINDINGS: Brain: Mild-to-moderate generalized cerebral atrophy. Mild cerebellar atrophy. Trace acute subarachnoid hemorrhage overlying the posterior right frontal lobe, unchanged (series 2, image 27) (series 5, image 41) (series 6, image 22). 13 x 4 mm dural-based mass overlying the mid right frontal lobe, likely reflecting a meningioma. No significant mass effect upon the underlying brain parenchyma. Chronic lacunar infarcts in bilateral cerebral hemispheric white matter, better  appreciated on the same-day brain MRI. Background mild chronic small ischemic changes within the cerebral white matter and pons. No demarcated cortical infarct No midline shift or hydrocephalus. Vascular: No hyperdense vessel. Atherosclerotic calcifications. Skull: No fracture or aggressive osseous lesion. Sinuses/Orbits: No mass or acute finding within the imaged orbits. No significant paranasal sinus disease at the imaged levels. IMPRESSION: Trace acute subarachnoid hemorrhage overlying the posterior right frontal lobe, unchanged from the brain MRI performed earlier today. Redemonstrated 13 x 4 mm dural-based mass overlying the mid right frontal lobe, likely reflecting a meningioma. No significant mass effect upon the underlying brain parenchyma. Background cerebral atrophy and chronic small vessel disease, stable. Electronically Signed   By: Kellie Simmering D.O.   On: 04/16/2022 12:16   CT CHEST WO CONTRAST  Result Date: 04/16/2022 CLINICAL DATA:  Sepsis EXAM: CT CHEST WITHOUT  CONTRAST TECHNIQUE: Multidetector CT imaging of the chest was performed following the standard protocol without IV contrast. RADIATION DOSE REDUCTION: This exam was performed according to the departmental dose-optimization program which includes automated exposure control, adjustment of the mA and/or kV according to patient size and/or use of iterative reconstruction technique. COMPARISON:  CT abdomen pelvis, 04/11/2022 FINDINGS: Cardiovascular: Aortic atherosclerosis. Cardiomegaly. Three-vessel coronary artery calcifications. Enlargement of the main pulmonary artery measuring up to 3.8 cm. No pericardial effusion. Mediastinum/Nodes: Prominent mediastinal lymph nodes. Thyroid gland, trachea, and esophagus demonstrate no significant findings. Lungs/Pleura: Moderate bilateral pleural effusions and associated atelectasis or consolidation increased when compared to the lung bases on examination dated 04/11/2022. Diffuse bilateral bronchial  wall thickening. Ground-glass airspace opacity and interseptal lobular thickening. Upper Abdomen: No acute abnormality. Musculoskeletal: No chest wall abnormality. No acute osseous findings. IMPRESSION: 1. Moderate bilateral pleural effusions and associated atelectasis or consolidation, increased when compared to the lung bases on examination dated 04/11/2022. 2. Diffuse bilateral bronchial wall thickening, ground-glass airspace opacity and interseptal lobular thickening, most consistent with pulmonary edema. 3. Cardiomegaly and coronary artery disease. 4. Enlargement of the main pulmonary artery, as can be seen in pulmonary hypertension. Aortic Atherosclerosis (ICD10-I70.0). Electronically Signed   By: Delanna Ahmadi M.D.   On: 04/16/2022 12:09   MR BRAIN WO CONTRAST  Result Date: 04/16/2022 CLINICAL DATA:  Provided history: Subarachnoid hemorrhage. EXAM: MRI HEAD WITHOUT CONTRAST TECHNIQUE: Multiplanar, multiecho pulse sequences of the brain and surrounding structures were obtained without intravenous contrast. COMPARISON:  CT angiogram head/neck 04/15/2022. Head CT 04/15/2022. FINDINGS: Mild intermittent motion degradation. Brain: Mild-to-moderate generalized cerebral atrophy. Mild cerebellar atrophy. Small curvilinear focus of T2 FLAIR hyperintense signal abnormality and susceptibility-weighted signal loss along the posterior right frontal lobe, compatible with small-volume acute subarachnoid hemorrhage (for instance as seen on series 9, image 48) (series 10, image 57). 13 x 4 mm dural-based mass overlying the mid right frontal lobe, likely reflecting a meningioma (series 9, image 40). No significant mass effect upon the underlying brain parenchyma. Chronic lacunar infarcts within bilateral cerebral hemispheric white matter. Background mild for age multifocal T2 FLAIR hyperintense signal abnormality within the cerebral white matter and pons, nonspecific but compatible with chronic small vessel ischemic  disease. 3-4 mm T1 and T2 FLAIR hyperintense lesion within the anterior right frontal lobe white matter with associated susceptibility-weighted signal loss (series 9, image 27) (series 10, image 33). This may reflect a cavernoma or chronic microhemorrhage. Several chronic microhemorrhages scattered elsewhere within the supratentorial and infratentorial brain. There is no acute infarct. No evidence of an intracranial mass. No midline shift or evidence of hydrocephalus. Vascular: Maintained flow voids within the proximal large arterial vessels. Skull and upper cervical spine: No focal suspicious marrow lesion. Incompletely assessed cervical spondylosis. Sinuses/Orbits: No mass or acute finding within the imaged orbits. Prior bilateral ocular lens replacement. Small mucous retention cyst within the left maxillary sinus. IMPRESSION: 1. Small-volume acute subarachnoid hemorrhage overlying the posterior right frontal lobe, unchanged from the prior head CT of 04/15/2022. 2. 13 x 4 mm dural-based mass overlying the right frontal lobe, likely reflecting a meningioma. No significant mass effect upon the underlying brain parenchyma. 3. Chronic lacunar infarcts in the bilateral cerebral hemispheric white matter. 4. Background mild cerebral white matter and pontine chronic small vessel ischemic disease. 5. 3-4 mm lesion within the anterior right frontal lobe, which may reflect a cavernoma or chronic microhemorrhage. 6. Several chronic microhemorrhages scattered elsewhere within the supratentorial and infratentorial brain. 7. Mild-to-moderate generalized cerebral atrophy.  8. Mild cerebellar atrophy. Electronically Signed   By: Kellie Simmering D.O.   On: 04/16/2022 11:45   DG CHEST PORT 1 VIEW  Result Date: 04/16/2022 CLINICAL DATA:  Shortness of breath. EXAM: PORTABLE CHEST 1 VIEW COMPARISON:  Chest x-ray April 15, 2022. FINDINGS: Slightly more dense left basilar consolidation with similar additional patchy airspace opacities  throughout the lungs. Possible trace bilateral pleural effusions. No visible pneumothorax. Similar cardiomediastinal silhouette. Median sternotomy. Mitral valve replacement. IMPRESSION: Slightly more dense left basilar consolidation with similar additional patchy airspace opacities throughout the lungs. Electronically Signed   By: Margaretha Sheffield M.D.   On: 04/16/2022 08:15   CT ANGIO HEAD NECK W WO CM (CODE STROKE)  Result Date: 04/15/2022 CLINICAL DATA:  Acute neurologic deficit EXAM: CT ANGIOGRAPHY HEAD AND NECK TECHNIQUE: Multidetector CT imaging of the head and neck was performed using the standard protocol during bolus administration of intravenous contrast. Multiplanar CT image reconstructions and MIPs were obtained to evaluate the vascular anatomy. Carotid stenosis measurements (when applicable) are obtained utilizing NASCET criteria, using the distal internal carotid diameter as the denominator. RADIATION DOSE REDUCTION: This exam was performed according to the departmental dose-optimization program which includes automated exposure control, adjustment of the mA and/or kV according to patient size and/or use of iterative reconstruction technique. CONTRAST:  178m OMNIPAQUE IOHEXOL 350 MG/ML SOLN COMPARISON:  None Available. FINDINGS: CTA NECK FINDINGS SKELETON: There is no bony spinal canal stenosis. No lytic or blastic lesion. OTHER NECK: Normal pharynx, larynx and major salivary glands. No cervical lymphadenopathy. Unremarkable thyroid gland. UPPER CHEST: Bilateral medium-sized pleural effusions and multiple subcentimeter mediastinal lymph nodes. Moderate pulmonary edema. AORTIC ARCH: There is calcific atherosclerosis of the aortic arch. There is no aneurysm, dissection or hemodynamically significant stenosis of the visualized portion of the aorta. Conventional 3 vessel aortic branching pattern. The visualized proximal subclavian arteries are widely patent. RIGHT CAROTID SYSTEM: Normal without  aneurysm, dissection or stenosis. LEFT CAROTID SYSTEM: Normal without aneurysm, dissection or stenosis. VERTEBRAL ARTERIES: Left dominant configuration. Both origins are clearly patent. There is no dissection, occlusion or flow-limiting stenosis to the skull base (V1-V3 segments). CTA HEAD FINDINGS POSTERIOR CIRCULATION: --Vertebral arteries: Left vertebral artery terminates in PICA. Otherwise normal. --Inferior cerebellar arteries: Normal. --Basilar artery: Normal. --Superior cerebellar arteries: Normal. --Posterior cerebral arteries (PCA): Normal. ANTERIOR CIRCULATION: --Intracranial internal carotid arteries: Normal. --Anterior cerebral arteries (ACA): Normal. Both A1 segments are present. Patent anterior communicating artery (a-comm). --Middle cerebral arteries (MCA): Normal. VENOUS SINUSES: As permitted by contrast timing, patent. ANATOMIC VARIANTS: None Review of the MIP images confirms the above findings. IMPRESSION: 1. No emergent large vessel occlusion or high-grade stenosis of the intracranial arteries. 2. Bilateral medium-sized pleural effusions and moderate pulmonary edema. 3. Upper mediastinal lymphadenopathy. Electronically Signed   By: KUlyses JarredM.D.   On: 04/15/2022 20:38   DG CHEST PORT 1 VIEW  Result Date: 04/15/2022 CLINICAL DATA:  [id number] SOB (shortness of breath) [id number] EXAM: PORTABLE CHEST 1 VIEW COMPARISON:  Chest x-ray 02/13/2022 FINDINGS: The heart and mediastinal contours are within normal limits. Mitral valve replacement. Aortic calcification. Interval development of patchy airspace opacities most prominent within the right lower lung zone and retrocardiac airspace opacity. No pulmonary edema. Bilateral trace pleural effusions. No pneumothorax. No acute osseous abnormality.  Sternotomy wires are intact. IMPRESSION: 1. Multifocal pneumonia. Followup PA and lateral chest X-ray is recommended in 3-4 weeks following therapy to ensure resolution and exclude underlying malignancy. 2.  Bilateral trace pleural effusions. 3.  Aortic Atherosclerosis (  ICD10-I70.0). Electronically Signed   By: Iven Finn M.D.   On: 04/15/2022 19:20   [place] EKG SITE RITE  Result Date: 04/15/2022 If Site Rite image not attached, placement could not be confirmed due to current cardiac rhythm.  CT HEAD WO CONTRAST (5MM)  Result Date: 04/15/2022 CLINICAL DATA:  Mental status change, unknown cause EXAM: CT HEAD WITHOUT CONTRAST TECHNIQUE: Contiguous axial images were obtained from the base of the skull through the vertex without intravenous contrast. RADIATION DOSE REDUCTION: This exam was performed according to the departmental dose-optimization program which includes automated exposure control, adjustment of the mA and/or kV according to patient size and/or use of iterative reconstruction technique. COMPARISON:  CT head 01/27/2022 BRAIN: BRAIN Cerebral ventricle sizes are concordant with the degree of cerebral volume loss. Patchy and confluent areas of decreased attenuation are noted throughout the deep and periventricular white matter of the cerebral hemispheres bilaterally, compatible with chronic microvascular ischemic disease. No evidence of large-territorial acute infarction. No parenchymal hemorrhage. No mass lesion. Vague right frontal sulcal hyperdensity may represent a trace developing subarachnoid hemorrhage (4:40, 2:28). No mass effect or midline shift. No hydrocephalus. Basilar cisterns are patent. Vascular: No hyperdense vessel. Skull: No acute fracture or focal lesion. Sinuses/Orbits: Paranasal sinuses and mastoid air cells are clear. Bilateral lens replacement. Otherwise the orbits are unremarkable. Other: None. IMPRESSION: Vague right frontal sulcal hyperdensity may represent a trace developing subarachnoid hemorrhage. These results will be called to the ordering clinician or representative by the Radiologist Assistant, and communication documented in the PACS or [employer]. Electronically  Signed   By: Iven Finn M.D.   On: 04/15/2022 17:25   CT ABDOMEN PELVIS WO CONTRAST  Result Date: 04/11/2022 CLINICAL DATA:  Abdominal pain, acute, nonlocalized, hematuria. EXAM: CT ABDOMEN AND PELVIS WITHOUT CONTRAST TECHNIQUE: Multidetector CT imaging of the abdomen and pelvis was performed following the standard protocol without IV contrast. RADIATION DOSE REDUCTION: This exam was performed according to the departmental dose-optimization program which includes automated exposure control, adjustment of the mA and/or kV according to patient size and/or use of iterative reconstruction technique. COMPARISON:  None Available. FINDINGS: Lower chest: Small right pleural effusion, new since prior examination. Moderate coronary artery calcification. Mitral valve replacement has been performed. Cardiac size is at the upper limits of normal. Hepatobiliary: No focal liver abnormality is seen. Status post cholecystectomy. No biliary dilatation. Pancreas: Unremarkable Spleen: Unremarkable Adrenals/Urinary Tract: The adrenal glands are unremarkable. Interval left nephrectomy. Residual right kidney is normal in size and position. No hydronephrosis. No intrarenal or ureteral calculi. The bladder is unremarkable. Stomach/Bowel: Moderate sigmoid and descending colonic diverticulosis without superimposed acute inflammatory change. There is polypoid soft tissue within the rectum, not well assessed on this noncontrast examination but persistent since prior examination suspicious for an underlying rectal mass best seen on axial image # 78/2. No evidence of obstruction or focal inflammation. The stomach, small bowel, and large bowel are otherwise unremarkable. Status post appendectomy. Vascular/Lymphatic: Moderate aortoiliac atherosclerotic calcification. No aortic aneurysm. No pathologic adenopathy within the abdomen and pelvis. Reproductive: Marked prostatic enlargement again noted. Other: No abdominal wall hernia.  Musculoskeletal: No acute bone abnormality. No lytic or blastic bone lesion. Osseous structures are age-appropriate. IMPRESSION: 1. No acute intra-abdominal pathology identified. No definite radiographic explanation for the patient's reported symptoms. 2. Interval left nephrectomy. 3. Polypoid soft tissue within the rectum, not well assessed on this noncontrast examination but suspicious for an underlying rectal mass. Correlation with endoscopy is recommended. 4. Moderate descending and sigmoid  colonic diverticulosis without superimposed acute inflammatory change. 5. Marked prostatic enlargement. 6. Small right pleural effusion, new since prior examination. Aortic Atherosclerosis (ICD10-I70.0). Electronically Signed   By: Fidela Salisbury M.D.   On: 04/11/2022 22:01    ASSESSMENT & PLAN:   88 y.o. very pleasant male with  1. Newly diagnosed rectal cancer in a background of high-grade dysplasia Colonoscopy revealed A polypoid and ulcerated non-obstructing medium-sized mass was found in the distal rectum and extends to and involves the dentate line. The mass was partially circumferential (involving one-third of the lumen circumference). The mass measured at least three cm in length. In addition, its diameter measured at least 3 cm. Oozing was present and evidence of 2 areas of clot. Biopsies were taken with a cold forceps for histology. MRI pelvis suggests 4.1 cm polypoid lesion T2 N0 Pathology result consistent with at least intramucosal rectal adenocarcinoma in the background of high-grade dysplasia.  2.  Proximal descending colon polyp 3.5 cm in size with at least intramucosal adenocarcinoma with diarrhea and high-grade dysplasia.  Status post piecemeal resection by Dr. Rush Landmark.  3.  Iron deficiency anemia with hemoglobin as low as 7 likely from GI bleeding.   Plan -I discussed available labs with the patient in details -Patient's chart reviewed in detail -We discussed the pathology results  showing  -We discussed the diagnosis of rectal and colon cancers, the natural history, staging, initial evaluation, common symptoms, treatment options follow-up. -We will get labs today to evaluate his current status further and for treatment planning. -Follow-up with orthopedics for management of back issues. Discussed need for age-appropriate vaccinations. -Surgery consultation for evaluation of possible resection of T2N0 rectal mass. -If the rectal tumor is not surgically resectable without significant morbidity.  Or the patient is deemed not to be medically stable and cleared for surgery then we will consider Xeloda based chemoradiation as outpatient. -We shall set up the patient for outpatient medical oncology follow-up.  Await surgical consultation. -Will eventually need referral to genetic counseling  Sullivan Lone MD [facility]

## 2022-04-24 ENCOUNTER — Inpatient Hospital Stay (HOSPITAL_COMMUNITY): Payer: Medicare HMO

## 2022-04-24 DIAGNOSIS — N1831 Chronic kidney disease, stage 3a: Secondary | ICD-10-CM | POA: Diagnosis not present

## 2022-04-24 DIAGNOSIS — K922 Gastrointestinal hemorrhage, unspecified: Secondary | ICD-10-CM | POA: Diagnosis not present

## 2022-04-24 DIAGNOSIS — J9601 Acute respiratory failure with hypoxia: Secondary | ICD-10-CM | POA: Diagnosis not present

## 2022-04-24 DIAGNOSIS — I5033 Acute on chronic diastolic (congestive) heart failure: Secondary | ICD-10-CM | POA: Diagnosis not present

## 2022-04-24 DIAGNOSIS — G9341 Metabolic encephalopathy: Secondary | ICD-10-CM | POA: Diagnosis not present

## 2022-04-24 DIAGNOSIS — I10 Essential (primary) hypertension: Secondary | ICD-10-CM | POA: Diagnosis not present

## 2022-04-24 LAB — CBC WITH DIFFERENTIAL/PLATELET
Abs Immature Granulocytes: 0.08 10*3/uL — ABNORMAL HIGH (ref 0.00–0.07)
Basophils Absolute: 0 10*3/uL (ref 0.0–0.1)
Basophils Relative: 0 %
Eosinophils Absolute: 0.1 10*3/uL (ref 0.0–0.5)
Eosinophils Relative: 1 %
HCT: 23.9 % — ABNORMAL LOW (ref 39.0–52.0)
Hemoglobin: 7.4 g/dL — ABNORMAL LOW (ref 13.0–17.0)
Immature Granulocytes: 1 %
Lymphocytes Relative: 10 %
Lymphs Abs: 1 10*3/uL (ref 0.7–4.0)
MCH: 29.1 pg (ref 26.0–34.0)
MCHC: 31 g/dL (ref 30.0–36.0)
MCV: 94.1 fL (ref 80.0–100.0)
Monocytes Absolute: 0.7 10*3/uL (ref 0.1–1.0)
Monocytes Relative: 7 %
Neutro Abs: 7.8 10*3/uL — ABNORMAL HIGH (ref 1.7–7.7)
Neutrophils Relative %: 81 %
Platelets: 163 10*3/uL (ref 150–400)
RBC: 2.54 MIL/uL — ABNORMAL LOW (ref 4.22–5.81)
RDW: 15 % (ref 11.5–15.5)
WBC: 9.6 10*3/uL (ref 4.0–10.5)
nRBC: 0 % (ref 0.0–0.2)

## 2022-04-24 LAB — PROTIME-INR
INR: 1.2 (ref 0.8–1.2)
Prothrombin Time: 14.8 seconds (ref 11.4–15.2)

## 2022-04-24 LAB — BASIC METABOLIC PANEL
Anion gap: 3 — ABNORMAL LOW (ref 5–15)
BUN: 45 mg/dL — ABNORMAL HIGH (ref 8–23)
CO2: 30 mmol/L (ref 22–32)
Calcium: 8.9 mg/dL (ref 8.9–10.3)
Chloride: 101 mmol/L (ref 98–111)
Creatinine, Ser: 1.28 mg/dL — ABNORMAL HIGH (ref 0.61–1.24)
GFR, Estimated: 54 mL/min — ABNORMAL LOW (ref >60)
Glucose, Bld: 92 mg/dL (ref 70–99)
Potassium: 4 mmol/L (ref 3.5–5.1)
Sodium: 134 mmol/L — ABNORMAL LOW (ref 135–145)

## 2022-04-24 LAB — MAGNESIUM: Magnesium: 1.8 mg/dL (ref 1.7–2.4)

## 2022-04-24 LAB — CEA: CEA: 2.1 ng/mL (ref 0.0–4.7)

## 2022-04-24 MED ORDER — FUROSEMIDE 20 MG PO TABS
20.0000 mg | ORAL_TABLET | Freq: Every day | ORAL | Status: DC
  Administered 2022-04-24 – 2022-04-30 (×7): 20 mg via ORAL
  Filled 2022-04-24 (×8): qty 1

## 2022-04-24 NOTE — Consult Note (Addendum)
Cardiology Consultation   Patient ID: Noeh Sparacino MRN: [id number]; DOB: 04/20/1933  Admit date: 04/11/2022 Date of Consult: 04/24/2022  PCP:  [name], MD   [facility] Providers Cardiologist:  Sinclair Grooms, MD   Patient Profile:   Naziah Weckerly is a 88 y.o. male with a hx of myasthenia gravis s/p thymectomy, persistent Afib on coumadin, HFpEF, MVR, HTN, left renal cell carcinoma s/p nephrectomy 01/22/22, CKD, BPH who is being seen 04/24/2022 for the evaluation of preoperative risk evaluation at the request of Dr. Karleen Hampshire.  History of Present Illness:   Mr. Dudek previously followed with [facility] cardiology ([place]). He has a history of mitral valve repair 03/2005 with normal valve function on echo 2014, 2016, 2021. 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 He has persistent atrial fibrillation and is anticoagulated with coumadin with prior cardioversion. Mitral valve has been stable on serial echos with most recent echo 09/2019 that reported LVEF 53%, mild RV dilation and dysfunction, biatrial enlargement (L > R), and good mitral valve function. He relocated to [facility] from [place] and established care with Dr. Tamala Julian for preoperative risk evaluation prior to nephrectomy. Left kidney neoplasm was found incidentally on CT A/P scan 06/2021 after pt suffered a fall down a stairwell and presented for evaluation. Of note, he stopped diuretic at home and had CHF exacerbation 2022. MG managed with chronic prednisone. Dr. Tamala Julian reviewed records (Pt splits time between [place] and [place]) and commented no significant coronary disease noted as part of his work-up prior to valve repair.  Given his A-fib and diastolic heart failure, the patient was noted to have an elevated risk for Mace prior to nephrectomy.  He underwent robotic nephrectomy 01/22/2022 without issues.  He was hospitalized 02/2022 with generalized weakness and bilateral lower extremity swelling found to have acute on  chronic kidney disease stage II, and bradycardia.  Atenolol that was used for rate control was discontinued.  Echo performed and showed moderately elevated PASP at 53.4 mmHg, and mild to moderate MR.  Also noted mild to moderate TR.  Pt presented to the ER 04/11/22 with melanotic stool. GI workup with endoscopy and polyp removal in descending colon and rectal mass  sent to pathology.  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 Hb was low in the setting of initial GI bleed, now stable after transfusion. He is awaiting surgical consultation to see if he is a candidate for resection of rectal mass. Cardiology was consulted for preoperative risk evaluation. [place] on hold.    Hospital course has been complicated by change in mental status on 04/15/2022 in the setting of acute on chronic respiratory acidosis.  Small SAH noted and neurology was consulted.  They felt his AMS likely due to metabolic encephalopathy and not small SAH.  Of note his INR was supratherapeutic at 3 on admission.  CXR with pulmonary congestion, which led to repeat echocardiogram 04/16/2022: appears stable from his study in August.  The patient also developed gross hematuria.  Urology was consulted.  Imaging reviewed for this admission and shows markedly enlarged prostate.  Urology felt his hematuria likely due to BPH and continue tamsulosin and added finasteride.  He was treated with BiPAP and IV diuresis.  Brain MRI showed a dural mass in the right frontal lobe consistent with meningioma felt not to be renal cell carcinoma metastasis or metastatic process.  Neurosurgery recommended no further work-up.  Of note neurosurgery commented on mild cerebral amyloid  angiopathy, which increases the risk of intracerebral hemorrhage in patients with anticoagulation.  During my evaluation, he confirms no history of ischemic heart disease, PCI, or stroke. As of 1 year ago, he was able to walk stadium stairs at a football game with his son  without angina. Since hi nephrectomy, he has become increasingly weak. Prior to the current hospitalization, he was able to walk about 150 ft. He denies current cardiac symptoms including CP, SOB, and palpitations.  Son at bedside and is involved in his care.   Past Medical History:  Diagnosis Date   A-fib [facility])    history of   Arthritis    Dyspnea    H/O mitral valve repair 07/2003   Hypertension    Ocular myasthenia gravis ([place])    takes pred   Pneumonia     Past Surgical History:  Procedure Laterality Date   APPENDECTOMY     BACK SURGERY     cyst removed L3 and L4   BIOPSY  04/19/2022   Procedure: BIOPSY;  Surgeon: Irving Copas., MD;  Location: [facility];  Service: Gastroenterology;;   CHOLECYSTECTOMY     COLONOSCOPY WITH PROPOFOL N/A 04/19/2022   Procedure: COLONOSCOPY WITH PROPOFOL;  Surgeon: Irving Copas., MD;  Location: [facility];  Service: Gastroenterology;  Laterality: N/A;   ENDOSCOPIC MUCOSAL RESECTION  04/19/2022   Procedure: ENDOSCOPIC MUCOSAL RESECTION;  Surgeon: Rush Landmark Telford Nab., MD;  Location: [facility];  Service: Gastroenterology;;   ESOPHAGOGASTRODUODENOSCOPY (EGD) WITH PROPOFOL N/A 04/19/2022   Procedure: ESOPHAGOGASTRODUODENOSCOPY (EGD) WITH PROPOFOL;  Surgeon: Irving Copas., MD;  Location: [facility];  Service: Gastroenterology;  Laterality: N/A;   FINGER SURGERY Right    R hand   HEMOSTASIS CLIP PLACEMENT  04/19/2022   Procedure: HEMOSTASIS CLIP PLACEMENT;  Surgeon: Irving Copas., MD;  Location: [facility];  Service: Gastroenterology;;   MITRAL VALVE REPAIR     on coumadin   NOSE SURGERY     POLYPECTOMY  04/19/2022   Procedure: POLYPECTOMY;  Surgeon: Rush Landmark Telford Nab., MD;  Location: [facility];  Service: Gastroenterology;;   ROBOTIC ASSITED PARTIAL NEPHRECTOMY Left 01/22/2022   Procedure: XI ROBOTIC ASSITED NEPHRECTOMY;  Surgeon: Ceasar Mons, MD;  Location: [facility];   Service: Urology;  Laterality: Left;   SUBMUCOSAL LIFTING INJECTION  04/19/2022   Procedure: SUBMUCOSAL LIFTING INJECTION;  Surgeon: Rush Landmark Telford Nab., MD;  Location: [facility];  Service: Gastroenterology;;   SUBMUCOSAL TATTOO INJECTION  04/19/2022   Procedure: SUBMUCOSAL TATTOO INJECTION;  Surgeon: Irving Copas., MD;  Location: [facility];  Service: Gastroenterology;;     Home Medications:  Prior to Admission medications   Medication Sig Start Date End Date Taking? Authorizing Provider  amLODipine (NORVASC) 5 MG tablet Take 1 tablet (5 mg total) by mouth daily. 04/10/22  Yes [name], MD  carboxymethylcellulose (REFRESH PLUS) 0.5 % SOLN 1 drop daily as needed (dry eyes).   Yes [provider]  Cholecalciferol (VITAMIN D3 PO) Take 1 tablet by mouth daily.   Yes [provider]  docusate sodium (COLACE) 100 MG capsule Take 1 capsule (100 mg total) by mouth 2 (two) times daily. Patient taking differently: Take 100 mg by mouth daily. 01/22/22  Yes Dancy, Estill Bamberg, PA-C  ferrous sulfate 325 (65 FE) MG tablet Take 325 mg by mouth daily with breakfast.   Yes [provider]  furosemide (LASIX) 20 MG tablet Take 20 mg by mouth daily.   Yes [provider]  glucosamine-chondroitin 500-400 MG  tablet Take 1 tablet by mouth daily.   Yes [provider]  HYDROcodone-acetaminophen (NORCO/VICODIN) 5-325 MG tablet Take 1-2 tablets by mouth every 6 (six) hours as needed for moderate pain or severe pain. 02/19/22  Yes Shelly Coss, MD  predniSONE (DELTASONE) 5 MG tablet Take 2 tablets (10 mg total) by mouth daily. Patient taking differently: Take 10 mg by mouth daily. Taking 2 tabs by mouth daily 02/11/22  Yes Patel, Donika K, DO  pyridostigmine (MESTINON) 60 MG tablet Take 1 tablet (60 mg total) by mouth every 8 (eight) hours. 02/19/22  Yes Shelly Coss, MD  tamsulosin (FLOMAX) 0.4 MG CAPS capsule Take 0.4 mg by mouth daily.   Yes  [provider]  warfarin (COUMADIN) 5 MG tablet Take 2.5-5 mg by mouth See admin instructions. Take 1 tablet (5 mg)on  Mon. Weds. Thursday, Friday and Sunday. Taking 1/2 tab( 2.5 mg) on Tues & Saturday   Yes [provider]  pantoprazole (PROTONIX) 40 MG tablet TAKE 1 TABLET BY MOUTH EVERY DAY 04/21/22   [name], MD    Inpatient Medications: Scheduled Meds:  feeding supplement  237 mL Oral Q24H   finasteride  5 mg Oral Daily   mouth rinse  15 mL Mouth Rinse 4 times per day   pantoprazole  40 mg Oral BID   predniSONE  10 mg Oral Daily   pyridostigmine  2 mg Intravenous QID   Or   pyridostigmine  60 mg Oral QID   tamsulosin  0.4 mg Oral Daily   Continuous Infusions:  PRN Meds: acetaminophen **OR** acetaminophen, mouth rinse  Allergies:    Allergies  Allergen Reactions   Aminoglycosides     Hx of MG    Botox [Onabotulinumtoxina]     Hx of MG    Corticosteroids     Caution with high doses as Hx of MG    Hydroxychloroquine     Hx of MG    Levaquin [Levofloxacin]     Hx of MG  - avoid all Fluoroquinolones   Macrolides And Ketolides     Hx of MG    Magnesium-Containing Compounds     Use with caution as pt with Hx of MG    Neuromuscular Blocking Agents     Hx of MG    Penicillamine     Hx of MG    Procainamide     Hx of MG    Quinidine     Hx of MG     Social History:   Social History   Socioeconomic History   Marital status: Widowed    Spouse name: Not on file   Number of children: 2   Years of education: Not on file   Highest education level: Not on file  Occupational History   Not on file  Tobacco Use   Smoking status: Former    Types: Cigarettes, Pipe, Cigars   Smokeless tobacco: Former    Types: Chew   Tobacco comments:    Uses nicotine gum   Vaping Use   Vaping Use: Never used  Substance and Sexual Activity   Alcohol use: Yes    Alcohol/week: 7.0 standard drinks of alcohol    Types: 7 Glasses of wine per week     Comment: Drinks wine when sons will let him   Drug use: Never   Sexual activity: Not on file  Other Topics Concern   Not on file  Social History Narrative   Lives in [place], [place].  Son lives in [place]      [place]    Lives in a one story home with a basement.    Lives at [facility] at this time   Social Determinants of Health   Financial Resource Strain: Not on file  Food Insecurity: No Food Insecurity (04/12/2022)   Hunger Vital Sign    Worried About Running Out of Food in the Last Year: Never true    Ran Out of Food in the Last Year: Never true  Transportation Needs: No Transportation Needs (04/12/2022)   PRAPARE - Hydrologist (Medical): No    Lack of Transportation (Non-Medical): No  Physical Activity: Not on file  Stress: Not on file  Social Connections: Not on file  Intimate Partner Violence: Not At Risk (04/12/2022)   Humiliation, Afraid, Rape, and Kick questionnaire    Fear of Current or Ex-Partner: No    Emotionally Abused: No    Physically Abused: No    Sexually Abused: No    Family History:    Family History  Problem Relation Age of Onset   Other Mother        Gallbladder removed   Stroke Father        intracranial stroke   Stroke Brother    Colon cancer Neg Hx    Rectal cancer Neg Hx      ROS:  Please see the history of present illness.   All other ROS reviewed and negative.     Physical Exam/Data:   Vitals:   04/23/22 2103 04/23/22 2107 04/24/22 0443 04/24/22 0450  BP: 132/66  124/64   Pulse: (!) 58 (!) 59 (!) 52   Resp: (!) '22 20 18   '$ Temp: 98.9 F (37.2 C)  99.1 F (37.3 C)   TempSrc: Oral  Oral   SpO2: 96% 96% 98%   Weight:    87.8 kg  Height:        Intake/Output Summary (Last 24 hours) at 04/24/2022 0813 Last data filed at 04/24/2022 0447 Gross per 24 hour  Intake 240 ml  Output 1350 ml  Net -1110 ml      04/24/2022    4:50 AM 04/22/2022    5:00 AM 04/21/2022    5:44 AM  Last 3 Weights   Weight (lbs) 193 lb 9.6 oz 193 lb 12.8 oz 183 lb 13.8 oz  Weight (kg) 87.816 kg 87.907 kg 83.4 kg     Body mass index is 25.9 kg/m.  General:  elderly frail male in NAD HEENT: normal Neck: no JVD Vascular: No carotid bruits; Distal pulses 2+ bilaterally Cardiac:  irregular rhythm, regular rate, soft murmur Lungs:  posterior lung exam with some crackles in bases Abd: soft, nontender, no hepatomegaly  Ext: 1+ B LE edema Musculoskeletal:  No deformities, BUE and BLE strength normal and equal Skin: warm and dry  Neuro:  CNs 2-12 intact, no focal abnormalities noted Psych:  Normal affect   EKG:  The EKG was personally reviewed and demonstrates:  atrial fibrillation with ventricular rate 84 Telemetry:  Telemetry was personally reviewed and demonstrates:  Afib rate controlled in the 70-80s  Relevant CV Studies:  Echo 04/2022: 1. Left ventricular ejection fraction, by estimation, is 55%. The left  ventricle has low normal function. The left ventricle has no regional wall  motion abnormalities. There is severe concentric left ventricular  hypertrophy. Left ventricular diastolic  parameters are indeterminate.   2. Right ventricular systolic function is normal.  The right ventricular  size is mildly enlarged. There is normal pulmonary artery systolic  pressure.   3. Left atrial size was moderately dilated.   4. Large pleural effusion in the left lateral region.   5. The mitral valve has been repaired/replaced. Mild to moderate mitral  valve regurgitation and eccentric, best seen in subcostal views   6. The aortic valve is tricuspid. Aortic valve regurgitation is mild.    Echo 10/05/21: Interpretation Summary The left ventricular ejection fraction is 53%. The left ventricle is mildly dilated. The right ventricle is mild to moderately dilated. Right ventricle function is moderately decreased. The left atrium is moderately dilated. The right atrium is severely dilated. An  annuloplasty ring is noted in the mitral position with fixed posterior leaflet. There is trace mitral regurgitation and normal gradient accross the valve. The IVC is dilated (>2.1 cm) with normal collapse during patient sniff (>50% collapse). The estimated RA pressure is 8 mm Hg (5-10 mm Hg)). Today's study was compared to one performed on 07/28/18. The LVEF was 56% on the prior report.   Laboratory Data:  High Sensitivity Troponin:   Recent Labs  Lab 04/11/22 2022 04/11/22 2222 04/15/22 1856 04/16/22 0539  TROPONINIHS 29* 25* 54* 67*     Chemistry Recent Labs  Lab 04/22/22 0418 04/23/22 0456 04/24/22 0508  NA 135 134* 134*  K 4.3 3.6 4.0  CL 101 100 101  CO2 '31 27 30  '$ GLUCOSE 100* 151* 92  BUN 46* 46* 45*  CREATININE 1.27* 1.33* 1.28*  CALCIUM 9.7 9.3 8.9  MG 1.8 1.7 1.8  GFRNONAA 55* 52* 54*  ANIONGAP 3* 7 3*    Recent Labs  Lab 04/21/22 0502 04/22/22 0418 04/23/22 0456  PROT 6.0* 5.9* 5.4*  ALBUMIN 2.5* 2.5* 2.3*  AST 14* 19 20  ALT '15 20 17  '$ ALKPHOS 56 63 52  BILITOT 0.9 1.2 0.9   Lipids No results for input(s): "CHOL", "TRIG", "HDL", "LABVLDL", "LDLCALC", "CHOLHDL" in the last 168 hours.  Hematology Recent Labs  Lab 04/22/22 0418 04/23/22 0456 04/24/22 0508  WBC 11.2* 8.4 9.6  RBC 2.62* 2.62* 2.54*  HGB 7.6* 7.5* 7.4*  HCT 25.0* 25.2* 23.9*  MCV 95.4 96.2 94.1  MCH 29.0 28.6 29.1  MCHC 30.4 29.8* 31.0  RDW 14.9 15.0 15.0  PLT 150 148* 163   Thyroid No results for input(s): "TSH", "FREET4" in the last 168 hours.  BNPNo results for input(s): "BNP", "PROBNP" in the last 168 hours.  DDimer No results for input(s): "DDIMER" in the last 168 hours.   Radiology/Studies:  MR PELVIS WO CM RECTAL CA STAGING  Result Date: 04/21/2022 CLINICAL DATA:  Rectal cancer staging EXAM: MRI PELVIS WITHOUT CONTRAST TECHNIQUE: Multiplanar multisequence MR imaging of the pelvis was performed. No intravenous contrast was administered. Ultrasound gel was  administered per rectum to optimize tumor evaluation. COMPARISON:  CT abdomen/pelvis dated 04/11/2022 FINDINGS: TUMOR LOCATION Tumor distance from Anal Verge/Skin Surface: 4.0 cm Tumor distance to Internal Anal Sphincter: 0 cm TUMOR DESCRIPTION Circumferential Extent: Right anterior, extending from 11:00 to 2:00 (series 8/image 23) Tumor Length: 4.1 cm T - CATEGORY Extension through Muscularis Propria: No=T2 Shortest Distance of any tumor/node from Mesorectal Fascia: 4 mm Extramural Vascular Invasion/Tumor Thrombus: No Invasion of Anterior Peritoneal Reflection: No Involvement of Adjacent Organs or Pelvic Sidewall: No Levator Ani Involvement: No N - CATEGORY Mesorectal Lymph Nodes >=85m: None=N0 Extra-mesorectal Lymphadenopathy: No Other:  None. IMPRESSION: 4.1 cm polypoid right anterior low rectal mass extending to  the anorectal junction, as described above. Assuming pathology returns a diagnosis of rectal adenocarcinoma, MR staging is provided below. Rectal adenocarcinoma T stage: T2 Rectal adenocarcinoma N stage:  N0 Distance from tumor to the internal anal sphincter is 0 cm. Electronically Signed   By: Julian Hy M.D.   On: 04/21/2022 17:15     Assessment and Plan:   Persistent atrial fibrillation History of bradycardia Beta-blocker (atenolol) discontinued in August for bradycardia He remains rate controlled on no medications Should he convert to RVR, would need to weigh risks and benefits of using BB or CCB for rate control given his myasthenia gravis   Chronic anticoagulation This patients CHA2DS2-VASc Score and unadjusted Ischemic Stroke Rate (% per year) is equal to 4.8 % stroke rate/year from a score of 4 (CHF, HTN, 2age - pt currently with small SAH, but neuro felt this was not the source of his AMS, so will not give 2 points for stroke) Coumadin currently on hold - INR 1.2   Acute on chronic diastolic heart failure - echo with preserved EF - has received intermittent lasix - he  is volume up with LE edema and JVD, lung sounds difficult - if going into surgery, would consider cautious fluids   GI bleed Hematuria - hematuria felt related to BPH - GI bleed related to colon/rectal cancer - Hb 7.4 - stable   Colon cancer Rectal cancer Appreciate oncology [place] on hold   Preoperative risk evaluation for MACE during the perioperative period He does not have a history of CAD, MI, prior PCI, or stroke.  He does not require insulin.  Renal function stable.  According to the RCRI, he has approximately 6.6% risk of major cardiac event during the perioperative period (HFpEF, and high risk surgery).  This risk would be lower if surgical resection was not completed via open abdominal procedure.  His main cardiovascular risks are from his persistent A-fib and mitral valve repair along with diastolic dysfunction. He has an elevated stroke risk off of anticoagulation. INR today 1.2. Difficult situation with bleeding / stroke risk. Agree with holding heparin for now. I had a long discussion with patient and son at bedside regarding cardiovascular risk and need for cancer surgery, should he be a candidate. Son is frustrated by lack of decisions made so far, but they understand his risk and agree to proceed with surgery, if needed.   Risk Assessment/Risk Scores:        For questions or updates, please contact [place] Please consult [email or web address] for contact info under    Signed, Ledora Bottcher, PA  04/24/2022 8:13 AM   I have seen and examined the patient along with Ledora Bottcher, PA .  I have reviewed the chart, notes and new data.  I agree with PA/NP's note.  Key new complaints: Feels weak, was too tired to work with physical therapy.  Denies dyspnea.  Reports that his edema was much worse in the past. Key examination changes: Irregular rhythm, mildly elevated jugular venous pulsations roughly 6-7 cm, unable to hear any apical murmur, mild dependent  edema with substantial wrinkling of the lower extremity suggesting that the swelling has been worse in the past. Key new findings / data: Creatinine stable at 1.28 (history of unilateral nephrectomy), hemoglobin stable at approximately 7.5 (after multiple blood transfusions).  PLAN: He has atrial fibrillation with spontaneously controlled ventricular rate and currently unable to continue anticoagulation due to active bleeding.  He has normal left ventricular systolic function  and a previous mitral valve repair that is functioning well. He tolerated nephrectomy just a few months ago without cardiovascular complications. From a strictly cardiovascular point of view he has at most a low to moderate increase in risk of major complications with abdominal surgery.  His risk is increased however due to his advanced age and general debilitation/deconditioning. I think it would be important for him to gain back some strength, improve his functional status, before he has a big surgery. In the meantime, I think the risk of repeat GI bleeding is prohibitive, if we were to start back on anticoagulants.  I think taking a few weeks to rehabilitate while off anticoagulants comes with a real, but small risk of embolic stroke. When it comes time to restart his anticoagulant, I would strongly recommend switching from warfarin to Eliquis.  He has not had a mitral valve replacement, but rather an annuloplasty repair.  Eliquis would be a superior choice with superior stroke protection and lower bleeding risk. He has some mild signs of hypervolemia.  I do not think he necessarily needs diuretics but would avoid administering additional fluids.  If more blood transfusions are necessary, he should receive loop diuretics with that.  [place] will sign off.   Medication Recommendations: No cardiac meds appear necessary at this time.  Stay off anticoagulants until the source of GI bleeding is removed. Other  recommendations (labs, testing, etc): Low-moderate risk of major cardiovascular complications with surgery, high risk of complications in general due to advanced age and poor functional status.  This risk could improve with appropriate rehabilitation. Follow up as an outpatient: Appointment is already scheduled for December 4.  We will be happy to come back and see Mrs. Landin for any additional questions regarding his cardiac problems during this hospitalization.   Sanda Klein, MD, [place] [phone] 04/24/2022, 12:25 PM

## 2022-04-24 NOTE — Care Management Important Message (Signed)
Important Message  Patient Details IM Letter given Name: Keyandre Pileggi MRN: [id number] Date of Birth: 1933-05-28   Medicare Important Message Given:  Yes     [name] 04/24/2022, 2:27 PM

## 2022-04-24 NOTE — Progress Notes (Signed)
Patient performed NIF and VC.  Average of 3 attempts is below:  NIF= >-40 cm H2O VC= 1.0 L

## 2022-04-24 NOTE — Progress Notes (Addendum)
Physical Therapy Treatment Patient Details Name: Daniel Sharp MRN: [id number] DOB: [birth year] Today's Date: 04/24/2022   History of Present Illness Patient is a 88 y.o. male presenting to [facility] ED on 10/5 with hematuria, subacute upper GI bleed and sepsis due to hemorrhagic cystitis. PMHx significant for L renal cell carcinoma s/p L nephrectomy 01/22/22 (did not undergo chemo or radiation), Afib, OA, HTN, MG, back surgery, cholecystectomy, appendectomy, chronic iron deficiency, and recent fall with dislocated Rt finger, fractured Rt foot, Rt 5th rib fracture. Rapid response on 10/9 for decreased responses. He underwent brain imaging and was found to have [place], dural based mass in the right frontal lobe consistent with typical meningioma, and multiple small microhemorrhages consistent with cerebral amyloid angiopathy considered mild in severity. He was evaluated by neurosurgery due to these findings and recommended to have no further intervention or work-up for the meningioma/dural based mass.  SAH was also considered stable.    PT Comments    Pt reports feeling weaker today and also requiring more assist.  Pt unable to stand completely erect or transfer to recliner today (as he has in past sessions).  Pt also with significant right lateral lean with sitting EOB and requiring assist to obtain midline and fatigues quickly requiring assist to maintain midline.  Continue to recommend SNF upon d/c.    04/24/22 1050  Vital Signs  Pulse Rate 100  Pulse Rate Source Monitor  BP (!) 134/59  BP Location Left Arm  BP Method Automatic  Patient Position (if appropriate) Sitting  Oxygen Therapy  SpO2 96 %  O2 Device Room Air    Recommendations for follow up therapy are one component of a multi-disciplinary discharge planning process, led by the attending physician.  Recommendations may be updated based on patient status, additional functional criteria and insurance authorization.  Follow Up Recommendations   Skilled nursing-short term rehab (<3 hours/day) Can patient physically be transported by private vehicle: No   Assistance Recommended at Discharge Frequent or constant Supervision/Assistance  Patient can return home with the following Help with stairs or ramp for entrance;Assistance with cooking/housework;Assist for transportation;Two people to help with walking and/or transfers;A lot of help with bathing/dressing/bathroom   Equipment Recommendations  None recommended by PT    Recommendations for Other Services       Precautions / Restrictions Precautions Precautions: Fall Precaution Comments: monitor O2     Mobility  Bed Mobility Overal bed mobility: Needs Assistance Bed Mobility: Rolling, Sidelying to Sit Rolling: Mod assist Sidelying to sit: Mod assist   Sit to supine: Mod assist   General bed mobility comments: cues for technique, assist to complete positioning    Transfers Overall transfer level: Needs assistance Equipment used: Rolling walker (2 wheels) Transfers: Sit to/from Stand Sit to Stand: Total assist, +2 physical assistance, From elevated surface           General transfer comment: attempted 3 times however pt unable to completely stand even with elevated bed (blocked knees so pt would not slide outward)    Ambulation/Gait                   Stairs             Wheelchair Mobility    Modified Rankin (Stroke Patients Only)       Balance Overall balance assessment: Needs assistance Sitting-balance support: Bilateral upper extremity supported, Feet supported Sitting balance-Leahy Scale: Zero Sitting balance - Comments: significant right lateral lean, required cues and manual assist to return  to midline, also lateral tilts head to right requiring correction; sat EOB for approx 5 minutes just working on maintaining midline posture Postural control: Right lateral lean                                  Cognition  Arousal/Alertness: Awake/alert Behavior During Therapy: Flat affect Overall Cognitive Status: Impaired/Different from baseline Area of Impairment: Following commands, Problem solving                       Following Commands: Follows one step commands with increased time     Problem Solving: Slow processing, Decreased initiation General Comments: noted to have increased time to respond to questions. was appropriate during session. son was present,.        Exercises      General Comments        Pertinent Vitals/Pain      Home Living                          Prior Function            PT Goals (current goals can now be found in the care plan section) Progress towards PT goals: Progressing toward goals    Frequency    Min 2X/week      PT Plan Current plan remains appropriate    Co-evaluation              AM-PAC PT "6 Clicks" Mobility   Outcome Measure  Help needed turning from your back to your side while in a flat bed without using bedrails?: A Lot Help needed moving from lying on your back to sitting on the side of a flat bed without using bedrails?: A Lot Help needed moving to and from a bed to a chair (including a wheelchair)?: Total Help needed standing up from a chair using your arms (e.g., wheelchair or bedside chair)?: Total Help needed to walk in hospital room?: Total Help needed climbing 3-5 steps with a railing? : Total 6 Click Score: 8    End of Session Equipment Utilized During Treatment: Gait belt Activity Tolerance: Patient limited by fatigue Patient left: in bed;with call bell/phone within reach;with bed alarm set;with family/visitor present Nurse Communication: Mobility status (RN and MD notified of right lateral lean today) PT Visit Diagnosis: Muscle weakness (generalized) (M62.81);Difficulty in walking, not elsewhere classified (R26.2)     Time: 0263-7858 PT Time Calculation (min) (ACUTE ONLY): 26  min  Charges:  $Therapeutic Activity: 23-37 mins                     Jannette Spanner PT, DPT Physical Therapist Acute Rehabilitation Services Preferred contact method: Secure Chat Weekend Pager Only: [phone] Office: [place] 04/24/2022, 1:13 PM

## 2022-04-24 NOTE — Progress Notes (Signed)
[facility]                                                                               Daniel Sharp, is a 88 y.o. male, DOB - 08-01-1932, GYI:[id number] Admit date - 04/11/2022    Outpatient Primary MD for the patient is [name], MD  LOS - 12  days    Brief summary   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  He was admitted on 04/11/22 with melanotic stools and gross hematuria.  He had been undergoing outpatient GI evaluation for EGD and colonoscopy. After admission he became grossly weaker with worsened respiratory status as well.  He was found to have hypercarbic respiratory failure and transferred to the ICU.  Pulmonology and neurology were also consulted.  He was placed on BiPAP with improvement in his mentation and respiratory status.  He was also noted to have volume overload and was started on diuresis, again with good response. He was not felt to warrant IVIG. He underwent brain imaging and was found to have [place], dural based mass in the right frontal lobe consistent with typical meningioma, and multiple small microhemorrhages consistent with cerebral amyloid angiopathy considered mild in severity. He was evaluated by neurosurgery due to these findings and recommended to have no further intervention or work-up for the meningioma/dural based mass.  SAH was also considered stable.   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  Mass partially circumferential and measuring at least 3 cm in length.  Oozing was noted to be present and evidence of 2 areas of clot.  This  was felt to be the culprit for his rectal bleeding and will have ongoing risk for further bleeding until definitively treated.  Therefore in setting of ongoing anemia requiring blood transfusions, anticoagulation will continue to be held Pelvic MRI shows 4.1 cm polypoid right anterior lower rectal mass extending to anorectal junction surgery , Oncology is on board. Son is hopeful to have the surgery done prior to discharge and inpatient given his complexity with MG and clearance needed, esp with anticoag now on hold; patient has already been cleared as necessary by neurology (see last progress note from 10/12 and 10/13 from Dr. Curly Shores).  Cardiology clearance requested and recommendations given.  Discussed with the patient's son at bedside.  Surgery to meet with the son tomorrow and discuss further steps.   Hematuria:  Resolved.  BPH with LUTS.  RCC s/p left nephrectomy on 01/2022  On tamsulosin and finasteride.  Urology consulted and recommendations given.   Acute hypoxic and hypercarbic respiratory failure: Multifactorial- hypercarbia from MG as well as fluid overload.  Wean oxygen as tolerated.  Currently not in distress.   Acute on chronic diastolic CHF New echo during hospitalization; EF 55%, no RWMA; severe concentric LVH.  Indeterminate diastolic parameters.  Mild enlarged right ventricle Restarted him on low dose lasix 20 mg daily.   Fever: Unclear etiology. Will rule out infectious causes.  Get blood cultures. CXR.  UA and urine cultures ordered.    Stage 3b CKD:  Creatinine at 1.2.  Baseline between 1.3 to 1.6.  Continue to monitor.     SAH / Meningioma:  Appreciate neurosurgery input.  SAH stable and there are multiple small "microhemorrhages". This has been discussed with neurology (Dr. Curly Shores). The microhemorrhages are felt moreso related to long term anticoagulation since 2007 and less likely from [employer] (as originally proposed from [place]). He is felt to NOT have  contraindication to anticoagulation in this context    PAF Rate controlled, on anti coagulation which was held for bleeding.    Acute anemia of blood loss from rectal bleeding / hematuria in the setting of  anemia of chronic disease.  Transfuse to keep hemoglobin greater than 7.     Hypertension:  Well controlled.    NSVT;   Ocular Myasthenia Gravis:  - on prednisone 10 mg. , which can be stopped as stress dose steroids can be used. - will reach out to Neurology.  - on mestinon.  - continue with NIF'S as per neurology.    Acute metabolic encephalopathy:  due to hypercarbia and deconditioning; some component of hospital delirium and cannot fully rule out hypercalcemia playing a factor Appears to have improved.    Deconditioning , Generalized weakness:  - therapy eval recommending SNF.  - pt unable t stand and is weaning towards right.  - will get a stat CT head for further eval.        RN Pressure Injury Documentation: Pressure Injury Coccyx Mid Deep Tissue Pressure Injury - Purple or maroon localized area of discolored intact skin or blood-filled blister due to damage of underlying soft tissue from pressure and/or shear. (Active)     Location: Coccyx  Location Orientation: Mid  Staging: Deep Tissue Pressure Injury - Purple or maroon localized area of discolored intact skin or blood-filled blister due to damage of underlying soft tissue from pressure and/or shear.  Wound Description (Comments):   Present on Admission: Yes  Dressing Type Foam - Lift dressing to assess site every shift 04/24/22 0800     Pressure Injury 04/15/22 Heel Right Stage 1 -  Intact skin with non-blanchable redness of a localized area usually over a bony prominence. (Active)  04/15/22 1105  Location: Heel  Location Orientation: Right  Staging: Stage 1 -  Intact skin with non-blanchable redness of a localized area usually over a bony prominence.  Wound Description (Comments):   Present on  Admission: No  Dressing Type Foam - Lift dressing to assess site every shift 04/24/22 0800    Malnutrition Type:  Nutrition Problem: Inadequate oral intake Etiology: other (see comment) (difficulty breathing and AMS)   Malnutrition Characteristics:  Signs/Symptoms: NPO status   Nutrition Interventions:  Interventions: Other (Comment) (advance diet per SLP recommendations and provide appropriate ONS)  Estimated body mass index is 25.9 kg/m as calculated from the following:   Height as of this encounter: 6' 0.5" (1.842 m).   Weight as of this encounter: 87.8 kg.  Code Status: PARTIAL DVT Prophylaxis:  SCDs Start: 04/11/22 2325   Level of Care: Level of care: Progressive Family Communication: family at bedside.   Disposition Plan:     Remains inpatient appropriate:  further eval for rectal mass.      Consultants:   General surgery.  Neurology PCCM.    Antimicrobials:   Anti-infectives (From admission, onward)    Start     Dose/Rate Route Frequency Ordered Stop   04/16/22 1115  ceFEPIme (MAXIPIME) 2 g in sodium chloride 0.9 % 100 mL IVPB  Status:  Discontinued        2 g 200 mL/hr over 30 Minutes Intravenous Every 12 hours 04/16/22 1018 04/17/22 1506   04/12/22 1800  cefTRIAXone (ROCEPHIN) 1 g in sodium chloride 0.9 % 100 mL IVPB  Status:  Discontinued        1 g 200 mL/hr over 30 Minutes Intravenous Every 24 hours 04/11/22 2329 04/16/22 0947   04/11/22 2300  cefTRIAXone (ROCEPHIN) 1 g in sodium chloride 0.9 % 100 mL IVPB        1 g 200 mL/hr over 30 Minutes Intravenous  Once 04/11/22 2256 04/11/22 2350        Medications  Scheduled Meds:  feeding supplement  237 mL Oral Q24H   finasteride  5 mg Oral Daily   mouth rinse  15 mL Mouth Rinse 4 times per day   pantoprazole  40 mg Oral BID   predniSONE  10 mg Oral Daily   pyridostigmine  2 mg Intravenous QID   Or   pyridostigmine  60 mg Oral QID   tamsulosin  0.4 mg Oral Daily   Continuous  Infusions: PRN Meds:.acetaminophen **OR** acetaminophen, mouth rinse    Subjective:   Anes Rigel was seen and examined today.  No abdominal pain or nausea or vomiting.   Objective:   Vitals:   04/24/22 0443 04/24/22 0450 04/24/22 1050 04/24/22 1407  BP: 124/64  (!) 134/59 136/76  Pulse: (!) 52  100 (!) 122  Resp: 18   19  Temp: 99.1 F (37.3 C)   (!) 103 F (39.4 C)  TempSrc: Oral   Oral  SpO2: 98%  96% 95%  Weight:  87.8 kg    Height:        Intake/Output Summary (Last 24 hours) at 04/24/2022 1427 Last data filed at 04/24/2022 0447 Gross per 24 hour  Intake 240 ml  Output 950 ml  Net -710 ml   Filed Weights   04/21/22 0544 04/22/22 0500 04/24/22 0450  Weight: 83.4 kg 87.9 kg 87.8 kg     Exam General: ill appearing elderly gentleman. Not in distress.  Cardiovascular: S1 S2 auscultated, no murmurs, RRR Respiratory: diminished air entry at bases.  Gastrointestinal: Soft, nontender, nondistended, + bowel sounds Ext: no pedal edema bilaterally Neuro: alert and answering simple questions.  Skin: No rashes    Data Reviewed:  I have personally reviewed following labs and imaging studies   CBC Lab Results  Component Value Date   WBC 9.6 04/24/2022   RBC 2.54 (L) 04/24/2022   HGB 7.4 (L) 04/24/2022   HCT 23.9 (L) 04/24/2022   MCV 94.1 04/24/2022   MCH 29.1 04/24/2022   PLT 163 04/24/2022   MCHC 31.0 04/24/2022   RDW 15.0 04/24/2022   LYMPHSABS 1.0 04/24/2022   MONOABS 0.7 04/24/2022   EOSABS 0.1 04/24/2022   BASOSABS 0.0 65/78/4696     Last metabolic panel Lab Results  Component Value Date   NA 134 (L) 04/24/2022   K 4.0 04/24/2022   CL 101 04/24/2022   CO2 30 04/24/2022   BUN 45 (H) 04/24/2022   CREATININE 1.28 (H) 04/24/2022   GLUCOSE 92 04/24/2022   GFRNONAA 54 (L) 04/24/2022   CALCIUM 8.9 04/24/2022   PHOS 2.7  04/18/2022   PROT 5.4 (L) 04/23/2022   ALBUMIN 2.3 (L) 04/23/2022   BILITOT 0.9 04/23/2022   ALKPHOS 52 04/23/2022   AST 20  04/23/2022   ALT 17 04/23/2022   ANIONGAP 3 (L) 04/24/2022    CBG (last 3)  No results for input(s): "GLUCAP" in the last 72 hours.    Coagulation Profile: Recent Labs  Lab 04/20/22 0248 04/21/22 0502 04/22/22 0418 04/23/22 0456 04/24/22 0508  INR 1.2 1.1 1.1 1.1 1.2     Radiology Studies: CT HEAD WO CONTRAST (5MM)  Result Date: 04/24/2022 CLINICAL DATA:  Leaning toward the right and unable to stand. Clinical concern for an acute stroke. EXAM: CT HEAD WITHOUT CONTRAST TECHNIQUE: Contiguous axial images were obtained from the base of the skull through the vertex without intravenous contrast. RADIATION DOSE REDUCTION: This exam was performed according to the departmental dose-optimization program which includes automated exposure control, adjustment of the mA and/or kV according to patient size and/or use of iterative reconstruction technique. COMPARISON:  04/16/2022 FINDINGS: Brain: No significant change in mild-to-moderate enlargement of the ventricles and subarachnoid spaces. Mild-to-moderate patchy white matter low density in both cerebral hemispheres without significant change. The previously demonstrated trace subarachnoid hemorrhage in the right frontal lobe is no longer visualized. No intracranial hemorrhage, mass lesion or CT evidence of acute infarction. Vascular: No hyperdense vessel or unexpected calcification. Skull: Normal. Negative for fracture or focal lesion. Sinuses/Orbits: Status post bilateral cataract extraction. Unremarkable bones and included paranasal sinuses. Other: Deviation of the anterior nasal septum to the left. IMPRESSION: 1. No acute abnormality. 2. Stable mild-to-moderate diffuse cerebral and cerebellar atrophy and mild-to-moderate chronic small vessel white matter ischemic changes in both cerebral hemispheres. Electronically Signed   By: Claudie Revering M.D.   On: 04/24/2022 14:12       Hosie Poisson M.D. [facility] 04/24/2022, 2:27 PM  Available  via Epic secure chat 7am-7pm After 7 pm, please refer to night coverage provider listed on amion.

## 2022-04-24 NOTE — Progress Notes (Signed)
Pt had poor effort at dong his NIF and VC. Pt is real restless.

## 2022-04-25 ENCOUNTER — Inpatient Hospital Stay (HOSPITAL_COMMUNITY): Payer: Medicare HMO

## 2022-04-25 DIAGNOSIS — J9601 Acute respiratory failure with hypoxia: Secondary | ICD-10-CM | POA: Diagnosis not present

## 2022-04-25 DIAGNOSIS — R7881 Bacteremia: Secondary | ICD-10-CM

## 2022-04-25 DIAGNOSIS — I10 Essential (primary) hypertension: Secondary | ICD-10-CM | POA: Diagnosis not present

## 2022-04-25 DIAGNOSIS — G9341 Metabolic encephalopathy: Secondary | ICD-10-CM | POA: Diagnosis not present

## 2022-04-25 DIAGNOSIS — I5033 Acute on chronic diastolic (congestive) heart failure: Secondary | ICD-10-CM | POA: Diagnosis not present

## 2022-04-25 LAB — PROCALCITONIN: Procalcitonin: 0.46 ng/mL

## 2022-04-25 LAB — CBC WITH DIFFERENTIAL/PLATELET
Abs Immature Granulocytes: 0.07 10*3/uL (ref 0.00–0.07)
Basophils Absolute: 0 10*3/uL (ref 0.0–0.1)
Basophils Relative: 0 %
Eosinophils Absolute: 0.1 10*3/uL (ref 0.0–0.5)
Eosinophils Relative: 1 %
HCT: 24.1 % — ABNORMAL LOW (ref 39.0–52.0)
Hemoglobin: 7.4 g/dL — ABNORMAL LOW (ref 13.0–17.0)
Immature Granulocytes: 1 %
Lymphocytes Relative: 11 %
Lymphs Abs: 1.2 10*3/uL (ref 0.7–4.0)
MCH: 28.7 pg (ref 26.0–34.0)
MCHC: 30.7 g/dL (ref 30.0–36.0)
MCV: 93.4 fL (ref 80.0–100.0)
Monocytes Absolute: 1 10*3/uL (ref 0.1–1.0)
Monocytes Relative: 9 %
Neutro Abs: 8.3 10*3/uL — ABNORMAL HIGH (ref 1.7–7.7)
Neutrophils Relative %: 78 %
Platelets: 162 10*3/uL (ref 150–400)
RBC: 2.58 MIL/uL — ABNORMAL LOW (ref 4.22–5.81)
RDW: 15.2 % (ref 11.5–15.5)
WBC: 10.6 10*3/uL — ABNORMAL HIGH (ref 4.0–10.5)
nRBC: 0 % (ref 0.0–0.2)

## 2022-04-25 LAB — BASIC METABOLIC PANEL
Anion gap: 6 (ref 5–15)
BUN: 45 mg/dL — ABNORMAL HIGH (ref 8–23)
CO2: 29 mmol/L (ref 22–32)
Calcium: 8.8 mg/dL — ABNORMAL LOW (ref 8.9–10.3)
Chloride: 102 mmol/L (ref 98–111)
Creatinine, Ser: 1.15 mg/dL (ref 0.61–1.24)
GFR, Estimated: >60 mL/min (ref >60)
Glucose, Bld: 96 mg/dL (ref 70–99)
Potassium: 4 mmol/L (ref 3.5–5.1)
Sodium: 137 mmol/L (ref 135–145)

## 2022-04-25 LAB — BLOOD CULTURE ID PANEL (REFLEXED) - BCID2

## 2022-04-25 LAB — URINALYSIS, ROUTINE W REFLEX MICROSCOPIC
Bacteria, UA: NONE SEEN
Bilirubin Urine: NEGATIVE
Glucose, UA: NEGATIVE mg/dL
Hgb urine dipstick: NEGATIVE
Ketones, ur: NEGATIVE mg/dL
Leukocytes,Ua: NEGATIVE
Nitrite: NEGATIVE
Protein, ur: NEGATIVE mg/dL
Specific Gravity, Urine: 1.011 (ref 1.005–1.030)
pH: 6 (ref 5.0–8.0)

## 2022-04-25 LAB — PROTIME-INR
INR: 1.2 (ref 0.8–1.2)
Prothrombin Time: 15.3 seconds — ABNORMAL HIGH (ref 11.4–15.2)

## 2022-04-25 LAB — ECHOCARDIOGRAM LIMITED
Height: 72.5 in
Weight: 3046.4 oz

## 2022-04-25 LAB — SURGICAL PATHOLOGY

## 2022-04-25 LAB — MAGNESIUM: Magnesium: 2 mg/dL (ref 1.7–2.4)

## 2022-04-25 MED ORDER — SODIUM CHLORIDE 0.9 % IV SOLN
2.0000 g | Freq: Four times a day (QID) | INTRAVENOUS | Status: AC
  Administered 2022-04-25 – 2022-04-29 (×18): 2 g via INTRAVENOUS
  Filled 2022-04-25 (×19): qty 2000

## 2022-04-25 NOTE — Progress Notes (Signed)
[facility]                                                                               Daniel Sharp, is a 88 y.o. male, DOB - Mar 15, 1933, HEN:[id number] Admit date - 04/11/2022    Outpatient Primary MD for the patient is Daniel Crook, MD  LOS - 13  days    Brief summary   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  He was admitted on 04/11/22 with melanotic stools and gross hematuria.  He had been undergoing outpatient GI evaluation for EGD and colonoscopy. After admission he became grossly weaker with worsened respiratory status as well.  He was found to have hypercarbic respiratory failure and transferred to the ICU.  Pulmonology and neurology were also consulted.  He was placed on BiPAP with improvement in his mentation and respiratory status.  He was also noted to have volume overload and was started on diuresis, again with good response. He was not felt to warrant IVIG. He underwent brain imaging and was found to have [place], dural based mass in the right frontal lobe consistent with typical meningioma, and multiple small microhemorrhages consistent with cerebral amyloid angiopathy considered mild in severity. He was evaluated by neurosurgery due to these findings and recommended to have no further intervention or work-up for the meningioma/dural based mass.  SAH was also considered stable.   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  Mass partially circumferential and measuring at least 3 cm in length.  Oozing was noted to be present and evidence of 2 areas of clot.  This  was felt to be the culprit for his rectal bleeding and will have ongoing risk for further bleeding until definitively treated.  Therefore in setting of ongoing anemia requiring blood transfusions, anticoagulation will continue to be held Pelvic MRI shows 4.1 cm polypoid right anterior lower rectal mass extending to anorectal junction surgery , Oncology is on board. Son is hopeful to have the surgery done prior to discharge and inpatient given his complexity with MG and clearance needed, esp with anticoag now on hold; patient has already been cleared as necessary by neurology (see last progress note from 10/12 and 10/13 from Dr. Curly Shores).  Cardiology clearance requested and recommendations given.  Discussed with the patient's son at bedside.  Surgery team discussed the plan with the patient's son regarding having the procedure done at a tertiary center. Referrals will be made by the surgeon.   Hematuria:  Resolved.  BPH with LUTS.  RCC s/p left nephrectomy on 01/2022  On tamsulosin and finasteride.  Urology consulted and recommendations given.   Acute hypoxic and hypercarbic respiratory failure: Multifactorial- hypercarbia from MG as well as fluid overload. Patient has been using BIPAP since 04/15/2022.  Last ABG from 10/12 shows persistent hypercarbia.  He will probably  need BIPAP on discharge to be used at the facility.  Wean oxygen as tolerated.  Currently not in distress.   Acute on chronic diastolic CHF New echo during hospitalization; EF 55%, no RWMA; severe concentric LVH.  Indeterminate diastolic parameters.  Mild enlarged right ventricle Restarted him on low dose lasix 20 mg daily.  He has diuresed about 11 lit since admission.   Fever with enterococcus bacteremia  CXR unchanged from previous.   UA is negative.  Blood cultures  2/2 bottles growing enterococcus, probably GI source.  Marland Kitchen He was started on ampicillin , ID on  board. Plan to repeat blood cultures in am and check TTE.   No chills or fever today. Continue to monitor.    Stage 3b CKD:  Creatinine is better than baseline.  Baseline between 1.3 to 1.6.  Continue to monitor.     SAH / Meningioma:  Appreciate neurosurgery input.  SAH stable and there are multiple small "microhemorrhages". This has been discussed with neurology (Dr. Curly Shores). The microhemorrhages are felt moreso related to long term anticoagulation since 2007 and less likely from [place] (as originally proposed from [place]). He is felt to NOT have contraindication to anticoagulation in this context    PAF Rate controlled, on anti coagulation which was held for bleeding.  Son would like the anti coagulation to be held for now , in fear to recurrent bleeding from the rectal ca.    Acute anemia of blood loss from rectal bleeding / hematuria in the setting of  anemia of chronic disease.  Transfuse to keep hemoglobin greater than 7.  Hemoglobin today is around 7.4.     Hypertension:  BP parameters are well controlled.    NSVT;   Ocular Myasthenia Gravis:  - on prednisone 10 mg and mestinon.  - continue with NIF'S as per neurology.    Acute metabolic encephalopathy:  due to hypercarbia and deconditioning; some component of hospital delirium and cannot fully rule out hypercalcemia playing a factor Appears to have improved.    Deconditioning , Generalized weakness:  - therapy eval recommending SNF.  - pt unable t stand and is weaning towards right.   CT head unchanged.        RN Pressure Injury Documentation: Pressure Injury Coccyx Mid Deep Tissue Pressure Injury - Purple or maroon localized area of discolored intact skin or blood-filled blister due to damage of underlying soft tissue from pressure and/or shear. (Active)     Location: Coccyx  Location Orientation: Mid  Staging: Deep Tissue Pressure Injury - Purple or maroon localized area of discolored intact skin or blood-filled blister due to damage of underlying soft tissue from  pressure and/or shear.  Wound Description (Comments):   Present on Admission: Yes  Dressing Type Foam - Lift dressing to assess site every shift 04/25/22 0733     Pressure Injury 04/15/22 Heel Right Stage 1 -  Intact skin with non-blanchable redness of a localized area usually over a bony prominence. (Active)  04/15/22 1105  Location: Heel  Location Orientation: Right  Staging: Stage 1 -  Intact skin with non-blanchable redness of a localized area usually over a bony prominence.  Wound Description (Comments):   Present on Admission: No  Dressing Type Foam - Lift dressing to assess site every shift 04/25/22 0733    Malnutrition Type:  Nutrition Problem: Inadequate oral intake Etiology: other (see comment) (difficulty breathing and AMS)   Malnutrition Characteristics:  Signs/Symptoms: NPO status   Nutrition Interventions:  Interventions: Other (  Comment) (advance diet per SLP recommendations and provide appropriate ONS)  Estimated body mass index is 25.47 kg/m as calculated from the following:   Height as of this encounter: 6' 0.5" (1.842 m).   Weight as of this encounter: 86.4 kg.  Code Status: PARTIAL DVT Prophylaxis:  SCDs Start: 04/11/22 2325   Level of Care: Level of care: Progressive Family Communication: family at bedside.   Disposition Plan:     Remains inpatient appropriate:  IV ampicillin for enterococcus bacteremia.      Consultants:   General surgery.  Neurology PCCM.    Antimicrobials:   Anti-infectives (From admission, onward)    Start     Dose/Rate Route Frequency Ordered Stop   04/16/22 1115  ceFEPIme (MAXIPIME) 2 g in sodium chloride 0.9 % 100 mL IVPB  Status:  Discontinued        2 g 200 mL/hr over 30 Minutes Intravenous Every 12 hours 04/16/22 1018 04/17/22 1506   04/12/22 1800  cefTRIAXone (ROCEPHIN) 1 g in sodium chloride 0.9 % 100 mL IVPB  Status:  Discontinued        1 g 200 mL/hr over 30 Minutes Intravenous Every 24 hours 04/11/22  2329 04/16/22 0947   04/11/22 2300  cefTRIAXone (ROCEPHIN) 1 g in sodium chloride 0.9 % 100 mL IVPB        1 g 200 mL/hr over 30 Minutes Intravenous  Once 04/11/22 2256 04/11/22 2350        Medications  Scheduled Meds:  feeding supplement  237 mL Oral Q24H   finasteride  5 mg Oral Daily   furosemide  20 mg Oral Daily   mouth rinse  15 mL Mouth Rinse 4 times per day   pantoprazole  40 mg Oral BID   predniSONE  10 mg Oral Daily   pyridostigmine  2 mg Intravenous QID   Or   pyridostigmine  60 mg Oral QID   tamsulosin  0.4 mg Oral Daily   Continuous Infusions: PRN Meds:.acetaminophen **OR** acetaminophen, mouth rinse    Subjective:   Khaza Blansett was seen and examined today.  No new complaints today.   Objective:   Vitals:   04/25/22 0600 04/25/22 0800 04/25/22 1000 04/25/22 1200  BP:  112/77  132/64  Pulse: 78 79 95 92  Resp: (!) '21 19 20 '$ (!) 24  Temp:  98.5 F (36.9 C)  99.4 F (37.4 C)  TempSrc:  Oral  Oral  SpO2: 95% 95% 95% 93%  Weight:      Height:        Intake/Output Summary (Last 24 hours) at 04/25/2022 1336 Last data filed at 04/25/2022 1200 Gross per 24 hour  Intake 600 ml  Output 1050 ml  Net -450 ml    Filed Weights   04/22/22 0500 04/24/22 0450 04/25/22 0500  Weight: 87.9 kg 87.8 kg 86.4 kg     Exam  General exam: Appears calm and comfortable  Respiratory system: Clear to auscultation. Respiratory effort normal. Cardiovascular system: S1 & S2 heard, RRR. No JVD, No pedal edema. Gastrointestinal system: Abdomen is nondistended, soft and nontender.  Central nervous system: Alert and oriented. No focal neurological deficits. Extremities: Symmetric 5 x 5 power. Skin: No rashes,  Psychiatry:  Mood & affect appropriate.     Data Reviewed:  I have personally reviewed following labs and imaging studies   CBC Lab Results  Component Value Date   WBC 10.6 (H) 04/25/2022   RBC 2.58 (L) 04/25/2022   HGB 7.4 (  L) 04/25/2022   HCT 24.1 (L)  04/25/2022   MCV 93.4 04/25/2022   MCH 28.7 04/25/2022   PLT 162 04/25/2022   MCHC 30.7 04/25/2022   RDW 15.2 04/25/2022   LYMPHSABS 1.2 04/25/2022   MONOABS 1.0 04/25/2022   EOSABS 0.1 04/25/2022   BASOSABS 0.0 67/06/4579     Last metabolic panel Lab Results  Component Value Date   NA 137 04/25/2022   K 4.0 04/25/2022   CL 102 04/25/2022   CO2 29 04/25/2022   BUN 45 (H) 04/25/2022   CREATININE 1.15 04/25/2022   GLUCOSE 96 04/25/2022   GFRNONAA >60 04/25/2022   CALCIUM 8.8 (L) 04/25/2022   PHOS 2.7 04/18/2022   PROT 5.4 (L) 04/23/2022   ALBUMIN 2.3 (L) 04/23/2022   BILITOT 0.9 04/23/2022   ALKPHOS 52 04/23/2022   AST 20 04/23/2022   ALT 17 04/23/2022   ANIONGAP 6 04/25/2022    CBG (last 3)  No results for input(s): "GLUCAP" in the last 72 hours.    Coagulation Profile: Recent Labs  Lab 04/21/22 0502 04/22/22 0418 04/23/22 0456 04/24/22 0508 04/25/22 0455  INR 1.1 1.1 1.1 1.2 1.2      Radiology Studies: DG CHEST PORT 1 VIEW  Result Date: 04/24/2022 CLINICAL DATA:  Fever EXAM: PORTABLE CHEST 1 VIEW COMPARISON:  04/19/2022 FINDINGS: Asymmetric airspace disease right greater than left unchanged. Small right effusion has progressed. No left effusion. Prior median sternotomy and valve replacement IMPRESSION: Asymmetric airspace disease right greater than left unchanged. Progression of small right effusion. Findings may represent pneumonia or asymmetric pulmonary edema. Electronically Signed   By: Franchot Gallo M.D.   On: 04/24/2022 16:19   CT HEAD WO CONTRAST (5MM)  Result Date: 04/24/2022 CLINICAL DATA:  Leaning toward the right and unable to stand. Clinical concern for an acute stroke. EXAM: CT HEAD WITHOUT CONTRAST TECHNIQUE: Contiguous axial images were obtained from the base of the skull through the vertex without intravenous contrast. RADIATION DOSE REDUCTION: This exam was performed according to the departmental dose-optimization program which includes  automated exposure control, adjustment of the mA and/or kV according to patient size and/or use of iterative reconstruction technique. COMPARISON:  04/16/2022 FINDINGS: Brain: No significant change in mild-to-moderate enlargement of the ventricles and subarachnoid spaces. Mild-to-moderate patchy white matter low density in both cerebral hemispheres without significant change. The previously demonstrated trace subarachnoid hemorrhage in the right frontal lobe is no longer visualized. No intracranial hemorrhage, mass lesion or CT evidence of acute infarction. Vascular: No hyperdense vessel or unexpected calcification. Skull: Normal. Negative for fracture or focal lesion. Sinuses/Orbits: Status post bilateral cataract extraction. Unremarkable bones and included paranasal sinuses. Other: Deviation of the anterior nasal septum to the left. IMPRESSION: 1. No acute abnormality. 2. Stable mild-to-moderate diffuse cerebral and cerebellar atrophy and mild-to-moderate chronic small vessel white matter ischemic changes in both cerebral hemispheres. Electronically Signed   By: Claudie Revering M.D.   On: 04/24/2022 14:12       Hosie Poisson M.D. [facility] 04/25/2022, 1:36 PM  Available via Epic secure chat 7am-7pm After 7 pm, please refer to night coverage provider listed on amion.

## 2022-04-25 NOTE — Progress Notes (Signed)
PT refused cpap for the night. 

## 2022-04-25 NOTE — Progress Notes (Signed)
NIF >-40 FVC 1.3 L Great effort this morning.

## 2022-04-25 NOTE — TOC Progression Note (Signed)
Transition of Care [facility]) - Progression Note    Patient Details  Name: Daniel Sharp MRN: [id number] Date of Birth: [birth year]  Transition of Care [facility]) CM/SW Contact  Zelta Enfield, Juliann Pulse, RN Phone Number: 04/25/2022, 1:58 PM  Clinical Narrative:Patient on bipap in hospital @ night-[facility] rep Britany-cannot provide Bipap @ [facility] level-must be weaned off-MD notified. Auth good till till 10/25.       Expected Discharge Plan: [place] Barriers to Discharge: Continued Medical Work up  Expected Discharge Plan and Services Expected Discharge Plan: [place] arrangements for the past 2 months: Single Family Home                                       Social Determinants of Health (SDOH) Interventions    Readmission Risk Interventions     No data to display

## 2022-04-25 NOTE — Progress Notes (Addendum)
6 Days Post-Op  Subjective: CC: Patient reports no abdominal pain, n/v. Tolerating diet. Passing flatus. Last bm 2 days ago and non-bloody.   Objective: Vital signs in last 24 hours: Temp:  [98.8 F (37.1 C)-103 F (39.4 C)] 98.9 F (37.2 C) (10/19 0520) Pulse Rate:  [75-122] 86 (10/19 0520) Resp:  [18-30] 20 (10/19 0520) BP: (104-136)/(51-90) 130/54 (10/19 0520) SpO2:  [94 %-100 %] 96 % (10/19 0520) Weight:  [86.4 kg] 86.4 kg (10/19 0500) Last BM Date : 04/23/22  Intake/Output from previous day: 10/18 0701 - 10/19 0700 In: 120 [P.O.:120] Out: 1050 [Urine:1050] Intake/Output this shift: No intake/output data recorded.  PE: Gen:  Alert, NAD, pleasant Card:  Irr, Irr Pulm: rate and effort appears wnl Abd: Soft, ND, NT, +BS Psych: A&Ox3   Lab Results:  Recent Labs    04/24/22 0508 04/25/22 0455  WBC 9.6 10.6*  HGB 7.4* 7.4*  HCT 23.9* 24.1*  PLT 163 162   BMET Recent Labs    04/24/22 0508 04/25/22 0455  NA 134* 137  K 4.0 4.0  CL 101 102  CO2 30 29  GLUCOSE 92 96  BUN 45* 45*  CREATININE 1.28* 1.15  CALCIUM 8.9 8.8*   PT/INR Recent Labs    04/24/22 0508 04/25/22 0455  LABPROT 14.8 15.3*  INR 1.2 1.2   CMP     Component Value Date/Time   NA 137 04/25/2022 0455   K 4.0 04/25/2022 0455   CL 102 04/25/2022 0455   CO2 29 04/25/2022 0455   GLUCOSE 96 04/25/2022 0455   BUN 45 (H) 04/25/2022 0455   CREATININE 1.15 04/25/2022 0455   CALCIUM 8.8 (L) 04/25/2022 0455   CALCIUM 11.9 (H) 04/18/2022 1026   PROT 5.4 (L) 04/23/2022 0456   ALBUMIN 2.3 (L) 04/23/2022 0456   AST 20 04/23/2022 0456   ALT 17 04/23/2022 0456   ALKPHOS 52 04/23/2022 0456   BILITOT 0.9 04/23/2022 0456   GFRNONAA >60 04/25/2022 0455   Lipase  No results found for: "LIPASE"  Studies/Results: DG CHEST PORT 1 VIEW  Result Date: 04/24/2022 CLINICAL DATA:  Fever EXAM: PORTABLE CHEST 1 VIEW COMPARISON:  04/19/2022 FINDINGS: Asymmetric airspace disease right greater than  left unchanged. Small right effusion has progressed. No left effusion. Prior median sternotomy and valve replacement IMPRESSION: Asymmetric airspace disease right greater than left unchanged. Progression of small right effusion. Findings may represent pneumonia or asymmetric pulmonary edema. Electronically Signed   By: Franchot Gallo M.D.   On: 04/24/2022 16:19   CT HEAD WO CONTRAST (5MM)  Result Date: 04/24/2022 CLINICAL DATA:  Leaning toward the right and unable to stand. Clinical concern for an acute stroke. EXAM: CT HEAD WITHOUT CONTRAST TECHNIQUE: Contiguous axial images were obtained from the base of the skull through the vertex without intravenous contrast. RADIATION DOSE REDUCTION: This exam was performed according to the departmental dose-optimization program which includes automated exposure control, adjustment of the mA and/or kV according to patient size and/or use of iterative reconstruction technique. COMPARISON:  04/16/2022 FINDINGS: Brain: No significant change in mild-to-moderate enlargement of the ventricles and subarachnoid spaces. Mild-to-moderate patchy white matter low density in both cerebral hemispheres without significant change. The previously demonstrated trace subarachnoid hemorrhage in the right frontal lobe is no longer visualized. No intracranial hemorrhage, mass lesion or CT evidence of acute infarction. Vascular: No hyperdense vessel or unexpected calcification. Skull: Normal. Negative for fracture or focal lesion. Sinuses/Orbits: Status post bilateral cataract extraction. Unremarkable bones and included paranasal  sinuses. Other: Deviation of the anterior nasal septum to the left. IMPRESSION: 1. No acute abnormality. 2. Stable mild-to-moderate diffuse cerebral and cerebellar atrophy and mild-to-moderate chronic small vessel white matter ischemic changes in both cerebral hemispheres. Electronically Signed   By: Claudie Revering M.D.   On: 04/24/2022 14:12     Anti-infectives: Anti-infectives (From admission, onward)    Start     Dose/Rate Route Frequency Ordered Stop   04/16/22 1115  ceFEPIme (MAXIPIME) 2 g in sodium chloride 0.9 % 100 mL IVPB  Status:  Discontinued        2 g 200 mL/hr over 30 Minutes Intravenous Every 12 hours 04/16/22 1018 04/17/22 1506   04/12/22 1800  cefTRIAXone (ROCEPHIN) 1 g in sodium chloride 0.9 % 100 mL IVPB  Status:  Discontinued        1 g 200 mL/hr over 30 Minutes Intravenous Every 24 hours 04/11/22 2329 04/16/22 0947   04/11/22 2300  cefTRIAXone (ROCEPHIN) 1 g in sodium chloride 0.9 % 100 mL IVPB        1 g 200 mL/hr over 30 Minutes Intravenous  Once 04/11/22 2256 04/11/22 2350        Assessment/Plan Rectal mass on colonoscopy, nonobstructing (partially circumferential), involving the denate line, with some oozing present during colonoscopy. Path with at least intramucosal adenocarcinoma arising in a background of high-grade dysplasia. MRI pelvis with 4.1 cm polypoid right anterior low rectal mass extending to the anorectal junction, T2, N0. He is now off anticoagulation and hgb has been stable since 10/15. Last transfusion requirement 10/14. No obvious mets on CT A/P 10/5 or CT chest 10/10. These were done w/o contrast. Defer to Onc if needs to be repeated with contrast. CEA wnl. Onc has seen. Their plan on note from 10/17 reviewed.   Proximal descending colon polyp 3.5 cm in size at least intramucosal adenocarcinoma arising in a background of high-grade dysplasia.  This was removed with piecemeal mucosal resection by Dr. Rush Landmark during colonoscopy. Treated with STSC. Clips (MR conditional) were placed. Tattooed.  Colonoscopy report with 12, sessile polyps found in the rectum (2), descending colon (2), transverse colon (3), ascending colon (4) and cecum (1). The polyps were 3 to 10 mm in size. Colon polyps (cecum, ascending, transverse, descending) with fragments of tubular adenoma(s) with high-grade  dysplasia on path.  It was a pleasure meeting Mr. Zyad Boomer today.  I have reviewed notes from primary team, GI, oncology, cardiology and neurology. Also discussed in person with cards. I discussed with my attending as well as 2 of our colorectal specialist to decide the best plan of action for the patient. Our team feels that Mr. Robinson's case would best be served at a tertiary care center such as [facility] or [facility]. His hgb is currently stable off anticoagulation and he is not clinically obstructed. I spoke with the patient about this recommendation at bedside - he stated understanding.  He asked that someone from our team try to come back later today when his son arrives to talk to him about this as well in person.  I have asked the RN to notify me when he arrives so someone from our team can come by to discuss this with him.   FEN - Okay for diet VTE - SCDs ID - Per primary  Persistent atrial fibrillation - Note reviewed from Dr. Sallyanne Kuster and his team. "According to the [facility], he has approximately 6.6% risk of major cardiac event during the perioperative period (HFpEF, and high  risk surgery)".  Do note that they felt "it would be important for him to gain back some strength, improve his functional status, before he has a big surgery".  Ocular Myasthenia Gravis since 1997 maintained on prednisone '10mg'$  daily  Scattered microhemorrhages on SWI sequence MRI, more likely related to chronic anticoagulation than CAA Subarachnoid Hemorrhage - Per notes felt moreso related to long term anticoagulation since 6770 Diastolic CHF Hx of prior Mitral Valve Repair Hx L RCC s/p nephrectomy 7/23 Stage 3b CKD HTN Fever   LOS: 13 days    Jillyn Ledger , [facility] 04/25/2022, 9:08 AM Please see Amion for pager number during day hours 7:00am-4:30pm

## 2022-04-25 NOTE — Progress Notes (Signed)
2D Echocardiogram has been performed.  Daniel Sharp 04/25/2022, 4:10 PM

## 2022-04-25 NOTE — Progress Notes (Signed)
NIF and VC performed NIF-40 VC-1.0

## 2022-04-25 NOTE — TOC Progression Note (Signed)
Transition of Care [facility]) - Progression Note    Patient Details  Name: Daniel Sharp MRN: [id number] Date of Birth: 1933/06/22  Transition of Care [facility]) CM/SW Contact  Dalores Weger, Juliann Pulse, RN Phone Number: 04/25/2022, 12:04 PM  Clinical Narrative:per [facility] rep Bryson Corona received auth-awaiting medical stability-MD notified. Noted surgery note;Partial code-if PTAR needed confirm if DNR or Full code-MD aware.       Expected Discharge Plan: [place] Barriers to Discharge: Continued Medical Work up  Expected Discharge Plan and Services Expected Discharge Plan: [place] arrangements for the past 2 months: Single Family Home                                       Social Determinants of Health (SDOH) Interventions    Readmission Risk Interventions     No data to display

## 2022-04-25 NOTE — Consult Note (Addendum)
[facility]       Reason for Consult: Enterococcus bacteremia    Referring Physician: CHAMP autoconsult  Active Problems:   Renal cell carcinoma of left kidney (HCC)   Myasthenia gravis without acute exacerbation (HCC)   Hypertension   Persistent atrial fibrillation (HCC)   Hypercalcemia   IDA (iron deficiency anemia)   Gross hematuria   GIB (gastrointestinal bleeding)   Sepsis ([place])   Hemorrhagic cystitis   Pressure injury of skin   Abnormal CT scan, colon   Acute respiratory failure with hypoxia and hypercapnia (HCC)   Acute metabolic encephalopathy   BPH (benign prostatic hyperplasia)   Acute on chronic diastolic CHF (congestive heart failure) (HCC)   SAH (subarachnoid hemorrhage) (HCC)   Meningioma (HCC)   NSVT (nonsustained ventricular tachycardia) (HCC)   Chronic kidney disease, stage 3a (HCC)   Rectal mass   Rectal adenocarcinoma (HCC)   Tubular adenoma of colon    feeding supplement  237 mL Oral Q24H   finasteride  5 mg Oral Daily   furosemide  20 mg Oral Daily   mouth rinse  15 mL Mouth Rinse 4 times per day   pantoprazole  40 mg Oral BID   predniSONE  10 mg Oral Daily   pyridostigmine  2 mg Intravenous QID   Or   pyridostigmine  60 mg Oral QID   tamsulosin  0.4 mg Oral Daily    Recommendations: Ampicillin Repeat blood cultures tomorrow TTE  Assessment: He has an acute onset of fever with no urinary concerns, most likely from his underlying GI pathology.  I less suspect endocarditis with the acute presentation but with the history of mitral valve surgery and need for resection, will check a TTE.    Antibiotics: Starting ampicillin  HPI: Daniel Sharp is a 88 y.o. male with a history of rectal adenocarcinoma with high grade dysplasia diagnosed this hospitalization now with positive blood cultures for Enterococcus faecalis on 2 sets of blood cultures from yesterday (1 bottle each set).   He had developed a fever of 103 at  the time with no obvious source.  He has a history also of myasthenia gravis, afib, MV repair and left renal mass, s/p left nephrectomy.  Plan for surgical resection of the mass with plan to transfer to an academic medical center due to the complexity of his medical comorbidities.   Review of Systems:  Constitutional: positive for fevers or negative for chills and sweats All other systems reviewed and are negative    Past Medical History:  Diagnosis Date   A-fib [facility])    history of   Arthritis    Dyspnea    H/O mitral valve repair 07/2003   Hypertension    Ocular myasthenia gravis ([place])    takes pred   Pneumonia     Social History   Tobacco Use   Smoking status: Former    Types: Cigarettes, Pipe, Cigars   Smokeless tobacco: Former    Types: Chew   Tobacco comments:    Uses nicotine gum   Vaping Use   Vaping Use: Never used  Substance Use Topics   Alcohol use: Yes    Alcohol/week: 7.0 standard drinks of alcohol    Types: 7 Glasses of wine per week    Comment: Drinks wine when sons will let him   Drug use: Never    Family History  Problem Relation Age of Onset   Other Mother  Gallbladder removed   Stroke Father        intracranial stroke   Stroke Brother    Colon cancer Neg Hx    Rectal cancer Neg Hx     Allergies  Allergen Reactions   Aminoglycosides     Hx of MG    Botox [Onabotulinumtoxina]     Hx of MG    Corticosteroids     Caution with high doses as Hx of MG    Hydroxychloroquine     Hx of MG    Levaquin [Levofloxacin]     Hx of MG  - avoid all Fluoroquinolones   Macrolides And Ketolides     Hx of MG    Magnesium-Containing Compounds     Use with caution as pt with Hx of MG    Neuromuscular Blocking Agents     Hx of MG    Penicillamine     Hx of MG    Procainamide     Hx of MG    Quinidine     Hx of MG     Physical Exam: Constitutional: in no apparent distress  Vitals:   04/25/22 1000 04/25/22 1200  BP:  132/64  Pulse: 95 92   Resp: 20 (!) 24  Temp:  99.4 F (37.4 C)  SpO2: 95% 93%   EYES: anicteric Respiratory: clear; Musculoskeletal: no edema Skin: no rash  Lab Results  Component Value Date   WBC 10.6 (H) 04/25/2022   HGB 7.4 (L) 04/25/2022   HCT 24.1 (L) 04/25/2022   MCV 93.4 04/25/2022   PLT 162 04/25/2022    Lab Results  Component Value Date   CREATININE 1.15 04/25/2022   BUN 45 (H) 04/25/2022   NA 137 04/25/2022   K 4.0 04/25/2022   CL 102 04/25/2022   CO2 29 04/25/2022    Lab Results  Component Value Date   ALT 17 04/23/2022   AST 20 04/23/2022   ALKPHOS 52 04/23/2022     Microbiology: Recent Results (from the past 240 hour(s))  MRSA Next Gen by PCR, Nasal     Status: None   Collection Time: 04/15/22  6:32 PM   Specimen: Nasal Mucosa; Nasal Swab  Result Value Ref Range Status   MRSA by PCR Next Gen NOT DETECTED NOT DETECTED Final    Comment: (NOTE) The GeneXpert MRSA Assay (FDA approved for NASAL specimens only), is one component of a comprehensive MRSA colonization surveillance program. It is not intended to diagnose MRSA infection nor to guide or monitor treatment for MRSA infections. Test performance is not FDA approved in patients less than 74 years old. Performed at [facility], [place] [address] [place], [place] [address]   Respiratory (~20 pathogens) panel by PCR     Status: None   Collection Time: 04/16/22 12:13 PM   Specimen: Nasopharyngeal Swab; Respiratory  Result Value Ref Range Status   Adenovirus NOT DETECTED NOT DETECTED Final   Coronavirus 229E NOT DETECTED NOT DETECTED Final    Comment: (NOTE) The Coronavirus on the Respiratory Panel, DOES NOT test for the novel  Coronavirus (2019 nCoV)    Coronavirus HKU1 NOT DETECTED NOT DETECTED Final   Coronavirus NL63 NOT DETECTED NOT DETECTED Final   Coronavirus OC43 NOT DETECTED NOT DETECTED Final   Metapneumovirus NOT DETECTED NOT DETECTED Final   Rhinovirus / Enterovirus NOT DETECTED NOT  DETECTED Final   Influenza A NOT DETECTED NOT DETECTED Final   Influenza B NOT DETECTED NOT DETECTED Final  Parainfluenza Virus 1 NOT DETECTED NOT DETECTED Final   Parainfluenza Virus 2 NOT DETECTED NOT DETECTED Final   Parainfluenza Virus 3 NOT DETECTED NOT DETECTED Final   Parainfluenza Virus 4 NOT DETECTED NOT DETECTED Final   Respiratory Syncytial Virus NOT DETECTED NOT DETECTED Final   Bordetella pertussis NOT DETECTED NOT DETECTED Final   Bordetella Parapertussis NOT DETECTED NOT DETECTED Final   Chlamydophila pneumoniae NOT DETECTED NOT DETECTED Final   Mycoplasma pneumoniae NOT DETECTED NOT DETECTED Final    Comment: Performed at [facility], [place] [address] [place], [place] [address]  Culture, blood (Routine X 2) w Reflex to ID Panel     Status: None (Preliminary result)   Collection Time: 04/24/22  2:23 PM   Specimen: BLOOD  Result Value Ref Range Status   Specimen Description   Final    BLOOD BLOOD RIGHT ARM Performed at [facility] [address], [place], [place] [address]    Special Requests   Final    BOTTLES DRAWN AEROBIC ONLY Blood Culture adequate volume Performed at [facility] [address] [place], [place] [address]    Culture  Setup Time   Final    GRAM POSITIVE COCCI IN CLUSTERS IN CHAINS IN PAIRS AEROBIC BOTTLE ONLY CRITICAL RESULT CALLED TO, READ BACK BY AND VERIFIED WITH: PHARMD T GREEN [id number] AT 1356 BY CM Performed at [facility], [place] [address], [place], [place] [address]    Culture GRAM POSITIVE COCCI  Final   Report Status PENDING  Incomplete  Culture, blood (Routine X 2) w Reflex to ID Panel     Status: None (Preliminary result)   Collection Time: 04/24/22  2:23 PM   Specimen: BLOOD  Result Value Ref Range Status   Specimen Description   Final    BLOOD BLOOD RIGHT HAND Performed at [facility] [address] [place], [place] [address]    Special Requests   Final     BOTTLES DRAWN AEROBIC ONLY Blood Culture adequate volume Performed at [facility] [address], [place], [place] [address]    Culture  Setup Time   Final    GRAM POSITIVE COCCI IN CLUSTERS IN CHAINS IN PAIRS CRITICAL VALUE NOTED.  VALUE IS CONSISTENT WITH PREVIOUSLY REPORTED AND CALLED VALUE. AEROBIC BOTTLE ONLY Performed at [facility], [place] [address], [place], [place] [address]    Culture GRAM POSITIVE COCCI  Final   Report Status PENDING  Incomplete  Blood Culture ID Panel (Reflexed)     Status: Abnormal   Collection Time: 04/24/22  2:23 PM  Result Value Ref Range Status   Enterococcus faecalis DETECTED (A) NOT DETECTED Final    Comment: CRITICAL RESULT CALLED TO, READ BACK BY AND VERIFIED WITH: PHARMD T GREEN [id number] AT 1962 BY CM    Enterococcus Faecium NOT DETECTED NOT DETECTED Final   Listeria monocytogenes NOT DETECTED NOT DETECTED Final   Staphylococcus species NOT DETECTED NOT DETECTED Final   Staphylococcus aureus (BCID) NOT DETECTED NOT DETECTED Final   Staphylococcus epidermidis NOT DETECTED NOT DETECTED Final   Staphylococcus lugdunensis NOT DETECTED NOT DETECTED Final   Streptococcus species NOT DETECTED NOT DETECTED Final   Streptococcus agalactiae NOT DETECTED NOT DETECTED Final   Streptococcus pneumoniae NOT DETECTED NOT DETECTED Final   Streptococcus pyogenes NOT DETECTED NOT DETECTED Final   A.calcoaceticus-baumannii NOT DETECTED NOT DETECTED Final   Bacteroides fragilis NOT DETECTED NOT DETECTED Final  Enterobacterales NOT DETECTED NOT DETECTED Final   Enterobacter cloacae complex NOT DETECTED NOT DETECTED Final   Escherichia coli NOT DETECTED NOT DETECTED Final   Klebsiella aerogenes NOT DETECTED NOT DETECTED Final   Klebsiella oxytoca NOT DETECTED NOT DETECTED Final   Klebsiella pneumoniae NOT DETECTED NOT DETECTED Final   Proteus species NOT DETECTED NOT DETECTED Final   Salmonella species NOT DETECTED NOT DETECTED Final    Serratia marcescens NOT DETECTED NOT DETECTED Final   Haemophilus influenzae NOT DETECTED NOT DETECTED Final   Neisseria meningitidis NOT DETECTED NOT DETECTED Final   Pseudomonas aeruginosa NOT DETECTED NOT DETECTED Final   Stenotrophomonas maltophilia NOT DETECTED NOT DETECTED Final   Candida albicans NOT DETECTED NOT DETECTED Final   Candida auris NOT DETECTED NOT DETECTED Final   Candida glabrata NOT DETECTED NOT DETECTED Final   Candida krusei NOT DETECTED NOT DETECTED Final   Candida parapsilosis NOT DETECTED NOT DETECTED Final   Candida tropicalis NOT DETECTED NOT DETECTED Final   Cryptococcus neoformans/gattii NOT DETECTED NOT DETECTED Final   Vancomycin resistance NOT DETECTED NOT DETECTED Final    Comment: Performed at [facility], [place] [address] [place], [place] [address]    Kashina Mecum W Cal Gindlesperger, [place] [facility] Infect[facility] Disease [facility] [email or web address] 04/25/2022, 2:56 PM

## 2022-04-26 DIAGNOSIS — G9341 Metabolic encephalopathy: Secondary | ICD-10-CM | POA: Diagnosis not present

## 2022-04-26 DIAGNOSIS — I10 Essential (primary) hypertension: Secondary | ICD-10-CM | POA: Diagnosis not present

## 2022-04-26 DIAGNOSIS — I5033 Acute on chronic diastolic (congestive) heart failure: Secondary | ICD-10-CM | POA: Diagnosis not present

## 2022-04-26 DIAGNOSIS — B952 Enterococcus as the cause of diseases classified elsewhere: Secondary | ICD-10-CM | POA: Diagnosis not present

## 2022-04-26 DIAGNOSIS — J9601 Acute respiratory failure with hypoxia: Secondary | ICD-10-CM | POA: Diagnosis not present

## 2022-04-26 DIAGNOSIS — R7881 Bacteremia: Secondary | ICD-10-CM | POA: Diagnosis not present

## 2022-04-26 LAB — CBC WITH DIFFERENTIAL/PLATELET
Abs Immature Granulocytes: 0.1 10*3/uL — ABNORMAL HIGH (ref 0.00–0.07)
Basophils Absolute: 0 10*3/uL (ref 0.0–0.1)
Basophils Relative: 0 %
Eosinophils Absolute: 0 10*3/uL (ref 0.0–0.5)
Eosinophils Relative: 0 %
HCT: 23.6 % — ABNORMAL LOW (ref 39.0–52.0)
Hemoglobin: 7.4 g/dL — ABNORMAL LOW (ref 13.0–17.0)
Immature Granulocytes: 1 %
Lymphocytes Relative: 11 %
Lymphs Abs: 1.3 10*3/uL (ref 0.7–4.0)
MCH: 29.5 pg (ref 26.0–34.0)
MCHC: 31.4 g/dL (ref 30.0–36.0)
MCV: 94 fL (ref 80.0–100.0)
Monocytes Absolute: 1.1 10*3/uL — ABNORMAL HIGH (ref 0.1–1.0)
Monocytes Relative: 9 %
Neutro Abs: 9.6 10*3/uL — ABNORMAL HIGH (ref 1.7–7.7)
Neutrophils Relative %: 79 %
Platelets: 192 10*3/uL (ref 150–400)
RBC: 2.51 MIL/uL — ABNORMAL LOW (ref 4.22–5.81)
RDW: 15.4 % (ref 11.5–15.5)
WBC: 12.2 10*3/uL — ABNORMAL HIGH (ref 4.0–10.5)
nRBC: 0 % (ref 0.0–0.2)

## 2022-04-26 LAB — BASIC METABOLIC PANEL
Anion gap: 6 (ref 5–15)
BUN: 49 mg/dL — ABNORMAL HIGH (ref 8–23)
CO2: 27 mmol/L (ref 22–32)
Calcium: 8.8 mg/dL — ABNORMAL LOW (ref 8.9–10.3)
Chloride: 103 mmol/L (ref 98–111)
Creatinine, Ser: 1.13 mg/dL (ref 0.61–1.24)
GFR, Estimated: >60 mL/min (ref >60)
Glucose, Bld: 103 mg/dL — ABNORMAL HIGH (ref 70–99)
Potassium: 3.9 mmol/L (ref 3.5–5.1)
Sodium: 136 mmol/L (ref 135–145)

## 2022-04-26 LAB — PROTIME-INR
INR: 1.2 (ref 0.8–1.2)
Prothrombin Time: 14.9 seconds (ref 11.4–15.2)

## 2022-04-26 LAB — URINE CULTURE: Culture: NO GROWTH

## 2022-04-26 LAB — MAGNESIUM: Magnesium: 1.8 mg/dL (ref 1.7–2.4)

## 2022-04-26 NOTE — Progress Notes (Signed)
Cpap on standby

## 2022-04-26 NOTE — Progress Notes (Signed)
NIF >-40, VC 1.6 good effort

## 2022-04-26 NOTE — Progress Notes (Signed)
    [facility] for Infect[facility] Disease   Reason for visit: Follow up on bacteremia  Interval History: remains afebrile > 48 hours  Physical Exam: Constitutional:  Vitals:   04/26/22 0908 04/26/22 1200  BP:  134/64  Pulse:    Resp:  (!) 22  Temp:    SpO2: 94% 97%   patient appears in NAD  Impression: bacteremia with E faecalis.  Repeat blood cultures sent.  TTE without obvious vegetation.    Plan: 1.  Continue with ampicillin. 2.  Monitor repeat blood cultures  If he remains at [facility], [place] team will see him again on Monday.

## 2022-04-26 NOTE — Progress Notes (Signed)
[facility]                                                                               Daniel Sharp, is a 88 y.o. 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  He was admitted on 04/11/22 with melanotic stools and gross hematuria.  He had been undergoing outpatient GI evaluation for EGD and colonoscopy. After admission he became grossly weaker with worsened respiratory status as well.  He was found to have hypercarbic respiratory failure and transferred to the ICU.  Pulmonology and neurology were also consulted.  He was placed on BiPAP with improvement in his mentation and respiratory status.  He was also noted to have volume overload and was started on diuresis, again with good response. He was not felt to warrant IVIG. He underwent brain imaging and was found to have [place], dural based mass in the right frontal lobe consistent with typical meningioma, and multiple small microhemorrhages consistent with cerebral amyloid angiopathy considered mild in severity. He was evaluated by neurosurgery due to these findings and recommended to have no further intervention or work-up for the meningioma/dural based mass.  SAH was also considered stable.   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  Mass partially circumferential and measuring at least 3 cm in length.  Oozing was noted to be present and evidence of 2 areas of clot.  This  was felt to be the culprit for his rectal bleeding and will have ongoing risk for further bleeding until definitively treated.  Therefore in setting of ongoing anemia requiring blood transfusions, anticoagulation will continue to be held Pelvic MRI shows 4.1 cm polypoid right anterior lower rectal mass extending to anorectal junction surgery , Oncology is on board. Son is hopeful to have the surgery done prior to discharge and inpatient given his complexity with MG and clearance needed, esp with anticoag now on hold; patient has already been cleared as necessary by neurology (see last progress note from 10/12 and 10/13 from Dr. Curly Shores).  Cardiology clearance requested and recommendations given.  Discussed with the patient's son at bedside.  Surgery team discussed the plan with the patient's son regarding having the procedure done at a tertiary center. Referrals will be made by the surgeon.   Hematuria:  Resolved.  BPH with LUTS.  RCC s/p left nephrectomy on 01/2022  On tamsulosin and finasteride.  Urology consulted and recommendations given.   Acute hypoxic and hypercarbic respiratory failure: Multifactorial- hypercarbia from MG as well as fluid overload. Patient has been using BIPAP since 04/15/2022.  Last ABG from 10/12 shows persistent hypercarbia.  He will probably  need BIPAP on discharge to be used at the facility.  Wean oxygen as tolerated.  Currently not in distress.   Acute on chronic diastolic CHF New echo during hospitalization; EF 55%, no RWMA; severe concentric LVH.  Indeterminate diastolic parameters.  Mild enlarged right ventricle Restarted him on low dose lasix 20 mg daily.  He has diuresed about 11 lit since admission.   Fever/  enterococcus bacteremia  CXR unchanged from previous.   UA is negative.  Blood cultures  2/2 bottles growing enterococcus, probably GI source.  Mar[place] Kitchen He was started on ampicillin , ID on  board. Plan to repeat blood cultures in am and check TTE. TTE  does not show any vegetations.  No chills or fever today. Continue to monitor.    Stage 3b CKD:  Creatinine is better than baseline.  Baseline between 1.3 to 1.6.  Continue to monitor.     SAH / Meningioma:  Appreciate neurosurgery input.  SAH stable and there are multiple small "microhemorrhages". This has been discussed with neurology (Dr. Curly Shores). The microhemorrhages are felt moreso related to long term anticoagulation since 2007 and less likely from [place] (as originally proposed from [place]). He is felt to NOT have contraindication to anticoagulation in this context    PAF Rate controlled, on anti coagulation which was held for bleeding.  Son would like the anti coagulation to be held for now , in fear to recurrent bleeding from the rectal ca.    Acute anemia of blood loss from rectal bleeding / hematuria in the setting of  anemia of chronic disease.  Transfuse to keep hemoglobin greater than 7.  Hemoglobin today is around 7.4.     Hypertension:  BP parameters are optimal.    NSVT;   Ocular Myasthenia Gravis:  - on prednisone 10 mg and mestinon.  - continue with NIF'S as per neurology.    Acute metabolic encephalopathy:  due to hypercarbia and deconditioning; some component of hospital delirium and cannot fully rule out hypercalcemia playing a factor Appears to have improved. Alert and answering questions appropriately.    Deconditioning , Generalized weakness:  - therapy eval recommending SNF.  - pt unable t stand and is weaning towards right.   CT head unchanged.        RN Pressure Injury Documentation: Pressure Injury Coccyx Mid Deep Tissue Pressure Injury - Purple or maroon localized area of discolored intact skin or blood-filled blister due to damage of underlying soft tissue from pressure and/or shear. (Active)     Location: Coccyx  Location Orientation: Mid  Staging: Deep Tissue Pressure Injury - Purple or maroon localized area of discolored intact skin  or blood-filled blister due to damage of underlying soft tissue from pressure and/or shear.  Wound Description (Comments):   Present on Admission: Yes  Dressing Type Foam - Lift dressing to assess site every shift 04/26/22 0802     Pressure Injury 04/15/22 Heel Right Stage 1 -  Intact skin with non-blanchable redness of a localized area usually over a bony prominence. (Active)  04/15/22 1105  Location: Heel  Location Orientation: Right  Staging: Stage 1 -  Intact skin with non-blanchable redness of a localized area usually over a bony prominence.  Wound Description (Comments):   Present on Admission: No  Dressing Type Foam - Lift dressing to assess site every shift 04/26/22 0802    Malnutrition Type:  Nutrition Problem: Inadequate oral intake Etiology: other (see comment) (difficulty breathing and AMS)   Malnutrition Characteristics:  Signs/Symptoms: NPO status   Nutrition Interventions:  Interventions: Other (Comment) (advance diet per SLP recommendations and provide appropriate ONS)  Estimated body mass index is 24.97 kg/m as calculated from the following:   Height as of this encounter: 6' 0.5" (1.842 m).   Weight as of this encounter: 84.7 kg.  Code Status: PARTIAL DVT Prophylaxis:  SCDs Start: 04/11/22 2325   Level of Care: Level of care: Progressive Family Communication: none at bedside.  Disposition Plan:     Remains inpatient appropriate:  IV ampicillin for enterococcus bacteremia.      Consultants:   General surgery.  Neurology PCCM.    Antimicrobials:   Anti-infectives (From admission, onward)    Start     Dose/Rate Route Frequency Ordered Stop   04/25/22 1500  ampicillin (OMNIPEN) 2 g in sodium chloride 0.9 % 100 mL IVPB        2 g 300 mL/hr over 20 Minutes Intravenous Every 6 hours 04/25/22 1405     04/16/22 1115  ceFEPIme (MAXIPIME) 2 g in sodium chloride 0.9 % 100 mL IVPB  Status:  Discontinued        2 g 200 mL/hr over 30 Minutes Intravenous  Every 12 hours 04/16/22 1018 04/17/22 1506   04/12/22 1800  cefTRIAXone (ROCEPHIN) 1 g in sodium chloride 0.9 % 100 mL IVPB  Status:  Discontinued        1 g 200 mL/hr over 30 Minutes Intravenous Every 24 hours 04/11/22 2329 04/16/22 0947   04/11/22 2300  cefTRIAXone (ROCEPHIN) 1 g in sodium chloride 0.9 % 100 mL IVPB        1 g 200 mL/hr over 30 Minutes Intravenous  Once 04/11/22 2256 04/11/22 2350        Medications  Scheduled Meds:  feeding supplement  237 mL Oral Q24H   finasteride  5 mg Oral Daily   furosemide  20 mg Oral Daily   mouth rinse  15 mL Mouth Rinse 4 times per day   pantoprazole  40 mg Oral BID   predniSONE  10 mg Oral Daily   pyridostigmine  60 mg Oral QID   tamsulosin  0.4 mg Oral Daily   Continuous Infusions:  ampicillin (OMNIPEN) IV 2 g (04/26/22 0845)   PRN Meds:.acetaminophen **OR** acetaminophen, mouth rinse    Subjective:   Daniel Sharp was seen and examined today.  Finished about 70 % of breakfast. Denies any new complaints.   Objective:   Vitals:   04/26/22 0440 04/26/22 0800 04/26/22 0905 04/26/22 0908  BP:  119/61    Pulse:  99 (!) 105   Resp:  20 18   Temp:      TempSrc:      SpO2:  92% 94% 94%  Weight: 84.7 kg     Height:        Intake/Output Summary (Last 24 hours) at 04/26/2022 1202 Last data filed at 04/26/2022 0736 Gross per 24 hour  Intake 540 ml  Output 1300 ml  Net -760 ml    Filed Weights   04/24/22 0450 04/25/22 0500 04/26/22 0440  Weight: 87.8 kg 86.4 kg 84.7 kg     Exam  General exam: Appears calm and comfortable  Respiratory system: Clear to auscultation. Respiratory effort normal. Cardiovascular system: S1 & S2 heard, irregularly irregular. No pedal edema. Gastrointestinal system: Abdomen is nondistended, soft and nontender. . Normal bowel sounds heard. Central nervous system: Alert and oriented. No focal neurological deficits. Extremities: Symmetric 5 x 5 power. Skin:  No rashes, lesions or  ulcers Psychiatry: Mood & affect appropriate.      Data Reviewed:  I have personally reviewed following labs and imaging studies   CBC Lab Results  Component Value Date   WBC 12.2 (H) 04/26/2022   RBC 2.51 (L) 04/26/2022   HGB 7.4 (L) 04/26/2022   HCT 23.6 (L) 04/26/2022   MCV 94.0 04/26/2022   MCH 29.5 04/26/2022   PLT 192 04/26/2022   MCHC 31.4 04/26/2022   RDW 15.4 04/26/2022   LYMPHSABS 1.3 04/26/2022   MONOABS 1.1 (H) 04/26/2022   EOSABS 0.0 04/26/2022   BASOSABS 0.0 25/95/6387     Last metabolic panel Lab Results  Component Value Date   NA 136 04/26/2022   K 3.9 04/26/2022   CL 103 04/26/2022   CO2 27 04/26/2022   BUN 49 (H) 04/26/2022   CREATININE 1.13 04/26/2022   GLUCOSE 103 (H) 04/26/2022   GFRNONAA >60 04/26/2022   CALCIUM 8.8 (L) 04/26/2022   PHOS 2.7 04/18/2022   PROT 5.4 (L) 04/23/2022   ALBUMIN 2.3 (L) 04/23/2022   BILITOT 0.9 04/23/2022   ALKPHOS 52 04/23/2022   AST 20 04/23/2022   ALT 17 04/23/2022   ANIONGAP 6 04/26/2022    CBG (last 3)  No results for input(s): "GLUCAP" in the last 72 hours.    Coagulation Profile: Recent Labs  Lab 04/22/22 0418 04/23/22 0456 04/24/22 0508 04/25/22 0455 04/26/22 0501  INR 1.1 1.1 1.2 1.2 1.2      Radiology Studies: ECHOCARDIOGRAM LIMITED  Result Date: 04/25/2022    ECHOCARDIOGRAM LIMITED REPORT   Patient Name:   DUSTEN ELLINWOOD Date of Exam: 04/25/2022 Medical Rec #:  [id number]   Height:       72.5 in Accession #:    [id number]  Weight:       190.4 lb Date of Birth:  1932-10-17   BSA:          2.097 m Patient Age:    49 years    BP:           132/64 mmHg Patient Gender: M           HR:           90 bpm. Exam Location:  Inpatient Procedure: Limited Echo Indications:    Bacteremia R78.81  History:        Patient has prior history of Echocardiogram examinations, most                 recent 04/16/2022. Arrythmias:Atrial Fibrillation,                 Signs/Symptoms:Dyspnea; Risk Factors:Hypertension.   Sonographer:    Bernadene Person RDCS Referring Phys: [place]  1. Limited study without doppler; s/p MV repair and MAC/subvalvular calcification noted but no obvious vegetations.  2. Left ventricular ejection fraction, by estimation, is 65 to 70%. The left ventricle has normal function. The left ventricle has no regional wall motion abnormalities.  3. Right ventricular systolic function is normal. The right ventricular size is normal.  4. Left atrial size was moderately dilated.  5. Right atrial size was mildly dilated.  6. The mitral valve has been repaired/replaced. Procedure Date: 2005.  7. The aortic valve is tricuspid. FINDINGS  Left Ventricle: Left ventricular ejection fraction, by estimation, is 65 to 70%. The left ventricle has normal function. The left ventricle has no regional wall motion abnormalities. The left ventricular internal cavity size was normal in size.  Right Ventricle: The right ventricular size is normal. Right ventricular systolic function is normal. Left Atrium: Left atrial size was moderately dilated. Right Atrium: Right atrial size was mildly dilated. Pericardium: Trivial pericardial effusion is present. Mitral Valve: The mitral valve has been repaired/replaced. Mild mitral annular calcification. Tricuspid Valve: The tricuspid valve is normal in structure. Aortic Valve: The aortic valve is tricuspid. Pulmonic Valve: The pulmonic valve was normal in structure. Aorta: The aortic root is normal in size and structure. Additional Comments: Limited study without doppler; s/p MV repair and MAC/subvalvular calcification noted but no obvious vegetations. Kirk Ruths MD Electronically signed by Kirk Ruths MD Signature Date/Time: 04/25/2022/4:21:14 PM    Final    DG CHEST PORT 1 VIEW  Result Date: 04/24/2022 CLINICAL DATA:  Fever EXAM: PORTABLE CHEST 1 VIEW COMPARISON:  04/19/2022 FINDINGS: Asymmetric airspace disease right greater than left unchanged. Small right  effusion has progressed. No left effusion. Prior median sternotomy and valve replacement IMPRESSION: Asymmetric airspace disease right greater than left unchanged. Progression of small right effusion. Findings may represent pneumonia or asymmetric pulmonary edema. Electronically Signed   By: Franchot Gallo M.D.   On: 04/24/2022 16:19   CT HEAD WO CONTRAST (5MM)  Result Date: 04/24/2022 CLINICAL DATA:  Leaning toward the right and unable to stand. Clinical concern for an acute stroke. EXAM: CT HEAD WITHOUT CONTRAST TECHNIQUE: Contiguous axial images were obtained from the base of the skull through the vertex without intravenous contrast. RADIATION DOSE REDUCTION: This exam was performed according to the departmental dose-optimization program which includes automated exposure control, adjustment of the mA and/or kV according to patient size and/or use of iterative reconstruction technique. COMPARISON:  04/16/2022 FINDINGS: Brain: No significant change in mild-to-moderate enlargement of the ventricles and subarachnoid spaces. Mild-to-moderate patchy white matter low density in both cerebral hemispheres without significant change. The previously demonstrated trace subarachnoid hemorrhage in the right frontal lobe is no longer visualized. No intracranial hemorrhage, mass lesion or CT evidence of acute infarction. Vascular: No hyperdense vessel or unexpected calcification. Skull: Normal. Negative for fracture or focal lesion. Sinuses/Orbits: Status post bilateral cataract extraction. Unremarkable bones and included paranasal sinuses. Other: Deviation of the anterior nasal septum to the left. IMPRESSION: 1. No acute abnormality. 2. Stable mild-to-moderate diffuse cerebral and cerebellar atrophy and mild-to-moderate chronic small vessel white matter ischemic changes in both cerebral hemispheres. Electronically Signed   By: Claudie Revering M.D.   On: 04/24/2022 14:12       Hosie Poisson M.D. [facility] 04/26/2022, 12:02 PM  Available via Epic secure chat 7am-7pm After 7 pm, please refer to night coverage provider listed on amion.

## 2022-04-26 NOTE — Progress Notes (Addendum)
NIF - 40 and VC 1.8. Pt did well with both. Pt refused cpap tonight. Machine remained bedside if needed.

## 2022-04-27 DIAGNOSIS — I10 Essential (primary) hypertension: Secondary | ICD-10-CM | POA: Diagnosis not present

## 2022-04-27 DIAGNOSIS — G9341 Metabolic encephalopathy: Secondary | ICD-10-CM | POA: Diagnosis not present

## 2022-04-27 DIAGNOSIS — J9601 Acute respiratory failure with hypoxia: Secondary | ICD-10-CM | POA: Diagnosis not present

## 2022-04-27 DIAGNOSIS — I5033 Acute on chronic diastolic (congestive) heart failure: Secondary | ICD-10-CM | POA: Diagnosis not present

## 2022-04-27 LAB — CBC WITH DIFFERENTIAL/PLATELET
Abs Immature Granulocytes: 0.23 10*3/uL — ABNORMAL HIGH (ref 0.00–0.07)
Basophils Absolute: 0 10*3/uL (ref 0.0–0.1)
Basophils Relative: 0 %
Eosinophils Absolute: 0.1 10*3/uL (ref 0.0–0.5)
Eosinophils Relative: 1 %
HCT: 23.7 % — ABNORMAL LOW (ref 39.0–52.0)
Hemoglobin: 7.4 g/dL — ABNORMAL LOW (ref 13.0–17.0)
Immature Granulocytes: 2 %
Lymphocytes Relative: 12 %
Lymphs Abs: 1.3 10*3/uL (ref 0.7–4.0)
MCH: 29.4 pg (ref 26.0–34.0)
MCHC: 31.2 g/dL (ref 30.0–36.0)
MCV: 94 fL (ref 80.0–100.0)
Monocytes Absolute: 1.1 10*3/uL — ABNORMAL HIGH (ref 0.1–1.0)
Monocytes Relative: 9 %
Neutro Abs: 9 10*3/uL — ABNORMAL HIGH (ref 1.7–7.7)
Neutrophils Relative %: 76 %
Platelets: 188 10*3/uL (ref 150–400)
RBC: 2.52 MIL/uL — ABNORMAL LOW (ref 4.22–5.81)
RDW: 15.8 % — ABNORMAL HIGH (ref 11.5–15.5)
WBC: 11.7 10*3/uL — ABNORMAL HIGH (ref 4.0–10.5)
nRBC: 0 % (ref 0.0–0.2)

## 2022-04-27 LAB — BASIC METABOLIC PANEL
Anion gap: 5 (ref 5–15)
BUN: 50 mg/dL — ABNORMAL HIGH (ref 8–23)
CO2: 27 mmol/L (ref 22–32)
Calcium: 8.6 mg/dL — ABNORMAL LOW (ref 8.9–10.3)
Chloride: 103 mmol/L (ref 98–111)
Creatinine, Ser: 1.21 mg/dL (ref 0.61–1.24)
GFR, Estimated: 58 mL/min — ABNORMAL LOW (ref >60)
Glucose, Bld: 93 mg/dL (ref 70–99)
Potassium: 3.8 mmol/L (ref 3.5–5.1)
Sodium: 135 mmol/L (ref 135–145)

## 2022-04-27 LAB — PREPARE RBC (CROSSMATCH)

## 2022-04-27 LAB — CULTURE, BLOOD (ROUTINE X 2)
Special Requests: ADEQUATE
Special Requests: ADEQUATE

## 2022-04-27 LAB — PROTIME-INR
INR: 1.2 (ref 0.8–1.2)
Prothrombin Time: 14.9 seconds (ref 11.4–15.2)

## 2022-04-27 LAB — MAGNESIUM: Magnesium: 1.7 mg/dL (ref 1.7–2.4)

## 2022-04-27 LAB — HEMOGLOBIN AND HEMATOCRIT, BLOOD
HCT: 25 % — ABNORMAL LOW (ref 39.0–52.0)
Hemoglobin: 7.7 g/dL — ABNORMAL LOW (ref 13.0–17.0)

## 2022-04-27 MED ORDER — SODIUM CHLORIDE 0.9% IV SOLUTION: Freq: Once | INTRAVENOUS | Status: AC

## 2022-04-27 NOTE — Progress Notes (Addendum)
NIF -30 and VC 1.4L. Good effort from pt. Pt said he doesn't want to wear cpap its standby.

## 2022-04-27 NOTE — Progress Notes (Signed)
RT NOTE:  NIF: -40 VC: 1L Good pt effort.

## 2022-04-27 NOTE — Consult Note (Signed)
[facility] Nurse Consult Note: Reason for Consult:Deep Tissue Pressure Injury to coccygeal area. Stage 1, non blanching erythema to right heel.  ICD-10 CM Codes for Irritant Dermatitis L24A2 - Due to fecal, urinary or dual incontinence  Pressure Injury POA: Yes Measurement: Coccygeal DTPI: 1.5cm x .75cm x 0.5cm  50% red, dry, with 50% yellow Non blanching erythema to right heel (Stage 1):  0.5cm round Wound bed:As noted above Drainage (amount, consistency, odor) None Periwound: Intact, moist Dressing procedure/placement/frequency: A mattress replacement with low air loss feature is implemented today to management of pressure and microclimate. Turning and repositioning is in place, but guidance is provided to minimize tie in the supine position. Heels are to be placed into Prevalon pressure redistribution heel boots. A sacral foam is to be placed over the DTPI after a wound contact layer of xeroform (antimicrobial, nonadherent) is placed first. This is to be changed daily and PRN soiling.   [facility] nursing team will not follow, but will remain available to this patient, the nursing and medical teams.  Please re-consult if needed.  Thank you for inviting [place] to participate in this patient's Plan of Care.  Maudie Flakes, MSN, RN, CNS, [place], Serita Grammes, [employer], [employer] phone:  [phone]

## 2022-04-27 NOTE — Progress Notes (Signed)
[facility]                                                                               Daniel Sharp, is a 88 y.o. 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  He was admitted on 04/11/22 with melanotic stools and gross hematuria.  He had been undergoing outpatient GI evaluation for EGD and colonoscopy. After admission he became grossly weaker with worsened respiratory status as well.  He was found to have hypercarbic respiratory failure and transferred to the ICU.  Pulmonology and neurology were also consulted.  He was placed on BiPAP with improvement in his mentation and respiratory status.  He was also noted to have volume overload and was started on diuresis, again with good response. He was not felt to warrant IVIG. He underwent brain imaging and was found to have [place], dural based mass in the right frontal lobe consistent with typical meningioma, and multiple small microhemorrhages consistent with cerebral amyloid angiopathy considered mild in severity. He was evaluated by neurosurgery due to these findings and recommended to have no further intervention or work-up for the meningioma/dural based mass.  SAH was also considered stable.   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  Mass partially circumferential and measuring at least 3 cm in length.  Oozing was noted to be present and evidence of 2 areas of clot.  This  was felt to be the culprit for his rectal bleeding and will have ongoing risk for further bleeding until definitively treated.  Therefore in setting of ongoing anemia requiring blood transfusions, anticoagulation will continue to be held Pelvic MRI shows 4.1 cm polypoid right anterior lower rectal mass extending to anorectal junction surgery , Oncology is on board. Son is hopeful to have the surgery done prior to discharge and inpatient given his complexity with MG and clearance needed, esp with anticoag now on hold; patient has already been cleared as necessary by neurology (see last progress note from 10/12 and 10/13 from Dr. Curly Shores).  Cardiology clearance requested and recommendations given.  Discussed with the patient's son at bedside.  Surgery team discussed the plan with the patient's son regarding having the procedure done at a tertiary center. Referrals will be made by the surgeon.   Hematuria:  Resolved.  BPH with LUTS.  RCC s/p left nephrectomy on 01/2022  On tamsulosin and finasteride.  Urology consulted and recommendations given.   Acute hypoxic and hypercarbic respiratory failure: Multifactorial- hypercarbia from MG as well as fluid overload. Patient has been using BIPAP since 04/15/2022.  Last ABG from 10/12 shows persistent hypercarbia.  He will probably  need BIPAP on discharge to be used at the facility.  Wean oxygen as tolerated.  Currently not in distress.   Acute on chronic diastolic CHF New echo during hospitalization; EF 55%, no RWMA; severe concentric LVH.  Indeterminate diastolic parameters.  Mild enlarged right ventricle Restarted him on low dose lasix 20 mg daily.  He has diuresed about 11 lit since admission.   Fever/  enterococcus bacteremia  CXR unchanged from previous.   UA is negative.  Blood cultures  2/2 bottles growing enterococcus, probably GI source.  Marland Kitchen He was started on ampicillin , ID on  board. Plan to repeat blood cultures in am and check TTE. TTE  does not show any vegetations. Dr Tommy Medal recommends TEE  No chills or fever today. Continue to monitor.    Stage 3b CKD:  Creatinine is better than baseline.  Baseline between 1.3 to 1.6.  Continue to monitor.     SAH / Meningioma:  Appreciate neurosurgery input.  SAH stable and there are multiple small "microhemorrhages". This has been discussed with neurology (Dr. Curly Shores). The microhemorrhages are felt moreso related to long term anticoagulation since 2007 and less likely from [place] (as originally proposed from [place]). He is felt to NOT have contraindication to anticoagulation in this context    PAF Rate controlled, on anti coagulation which was held for bleeding.  Son would like the anti coagulation to be held for now , in fear to recurrent bleeding from the rectal ca.    Acute anemia of blood loss from rectal bleeding / hematuria in the setting of  anemia of chronic disease.  Transfuse to keep hemoglobin greater than 7.  Hemoglobin today is around 7.4. maroon color stool today. Will recheck hemoglobin.     Hypertension:  BP parameters are optimal.    NSVT;   Ocular Myasthenia Gravis:  - on prednisone 10 mg and mestinon.  - continue with NIF'S as per neurology.    Acute metabolic encephalopathy:  due to hypercarbia and deconditioning; some component of hospital delirium and cannot fully rule out hypercalcemia playing a factor Appears to have improved. Alert and answering questions appropriately.    Deconditioning , Generalized weakness:  - therapy eval recommending SNF.  - pt unable t stand and is weaning towards right.   CT head unchanged.        RN Pressure Injury Documentation: Pressure Injury Coccyx Mid Deep Tissue Pressure Injury - Purple or maroon localized area of discolored intact skin or blood-filled blister due to damage of underlying soft tissue from pressure and/or shear. (Active)     Location: Coccyx  Location Orientation: Mid  Staging: Deep Tissue  Pressure Injury - Purple or maroon localized area of discolored intact skin or blood-filled blister due to damage of underlying soft tissue from pressure and/or shear.  Wound Description (Comments):   Present on Admission: Yes  Dressing Type Foam - Lift dressing to assess site every shift 04/27/22 0839     Pressure Injury 04/15/22 Heel Right Stage 1 -  Intact skin with non-blanchable redness of a localized area usually over a bony prominence. (Active)  04/15/22 1105  Location: Heel  Location Orientation: Right  Staging: Stage 1 -  Intact skin with non-blanchable redness of a localized area usually over a bony prominence.  Wound Description (Comments):   Present on Admission: No  Dressing Type Foam - Lift dressing to assess site every shift 04/26/22 2224   WOUND CARE CONSULTED AND RECOMMENDATIONS GIVEN.   Malnutrition Type:  Nutrition Problem: Inadequate oral intake Etiology: other (see comment) (difficulty breathing and AMS)   Malnutrition Characteristics:  Signs/Symptoms: NPO status   Nutrition Interventions:  Interventions: Other (Comment) (advance diet per SLP recommendations and provide appropriate ONS)  Estimated body mass index is 24.97 kg/m as calculated from the following:   Height as of this encounter: 6' 0.5" (1.842 m).   Weight as of this encounter: 84.7 kg.  Code Status: PARTIAL DVT Prophylaxis:  SCDs Start: 04/11/22 2325   Level of Care: Level of care: Progressive Family Communication: none at bedside.  Disposition Plan:     Remains inpatient appropriate:  IV ampicillin for enterococcus bacteremia.      Consultants:   General surgery.  Neurology PCCM.    Antimicrobials:   Anti-infectives (From admission, onward)    Start     Dose/Rate Route Frequency Ordered Stop   04/25/22 1500  ampicillin (OMNIPEN) 2 g in sodium chloride 0.9 % 100 mL IVPB        2 g 300 mL/hr over 20 Minutes Intravenous Every 6 hours 04/25/22 1405     04/16/22 1115  ceFEPIme  (MAXIPIME) 2 g in sodium chloride 0.9 % 100 mL IVPB  Status:  Discontinued        2 g 200 mL/hr over 30 Minutes Intravenous Every 12 hours 04/16/22 1018 04/17/22 1506   04/12/22 1800  cefTRIAXone (ROCEPHIN) 1 g in sodium chloride 0.9 % 100 mL IVPB  Status:  Discontinued        1 g 200 mL/hr over 30 Minutes Intravenous Every 24 hours 04/11/22 2329 04/16/22 0947   04/11/22 2300  cefTRIAXone (ROCEPHIN) 1 g in sodium chloride 0.9 % 100 mL IVPB        1 g 200 mL/hr over 30 Minutes Intravenous  Once 04/11/22 2256 04/11/22 2350        Medications  Scheduled Meds:  sodium chloride   Intravenous Once   feeding supplement  237 mL Oral Q24H   finasteride  5 mg Oral Daily   furosemide  20 mg Oral Daily   mouth rinse  15 mL Mouth Rinse 4 times per day   pantoprazole  40 mg Oral BID   predniSONE  10 mg Oral Daily   pyridostigmine  60 mg Oral QID   tamsulosin  0.4 mg Oral Daily   Continuous Infusions:  ampicillin (OMNIPEN) IV 2 g (04/27/22 1449)   PRN Meds:.acetaminophen **OR** acetaminophen, mouth rinse    Subjective:   Daniel Sharp was seen and examined today.  No new complaints.   Objective:   Vitals:   04/27/22 0900 04/27/22 1300 04/27/22 1449 04/27/22 1734  BP: (!) 125/51 131/79  109/65  Pulse:    (!) 113  Resp:    20  Temp: 98.8 F (37.1 C) 98.9 F (37.2 C) 100.3 F (37.9 C) 99.1 F (37.3 C)  TempSrc: Oral Oral Oral Oral  SpO2:    94%  Weight:      Height:        Intake/Output Summary (Last 24 hours) at 04/27/2022 1756 Last data filed at 04/27/2022 1604 Gross per 24 hour  Intake 780 ml  Output 1025 ml  Net -245 ml    Filed Weights   04/24/22 0450 04/25/22 0500 04/26/22 0440  Weight: 87.8 kg 86.4 kg 84.7 kg     Exam  General exam: Appears calm and comfortable  Respiratory system: Clear to auscultation. Respiratory effort normal. Cardiovascular system: S1 & S2 heard, irregularly irregular. No pedal  edema. Gastrointestinal system: Abdomen is nondistended,  soft and nontender.  Central nervous system: Alert and oriented to place and person.  Extremities: Symmetric 5 x 5 power. Skin: No rashes, lesions or ulcers Psychiatry: Mood & affect appropriate.       Data Reviewed:  I have personally reviewed following labs and imaging studies   CBC Lab Results  Component Value Date   WBC 11.7 (H) 04/27/2022   RBC 2.52 (L) 04/27/2022   HGB 7.7 (L) 04/27/2022   HCT 25.0 (L) 04/27/2022   MCV 94.0 04/27/2022   MCH 29.4 04/27/2022   PLT 188 04/27/2022   MCHC 31.2 04/27/2022   RDW 15.8 (H) 04/27/2022   LYMPHSABS 1.3 04/27/2022   MONOABS 1.1 (H) 04/27/2022   EOSABS 0.1 04/27/2022   BASOSABS 0.0 01/77/9390     Last metabolic panel Lab Results  Component Value Date   NA 135 04/27/2022   K 3.8 04/27/2022   CL 103 04/27/2022   CO2 27 04/27/2022   BUN 50 (H) 04/27/2022   CREATININE 1.21 04/27/2022   GLUCOSE 93 04/27/2022   GFRNONAA 58 (L) 04/27/2022   CALCIUM 8.6 (L) 04/27/2022   PHOS 2.7 04/18/2022   PROT 5.4 (L) 04/23/2022   ALBUMIN 2.3 (L) 04/23/2022   BILITOT 0.9 04/23/2022   ALKPHOS 52 04/23/2022   AST 20 04/23/2022   ALT 17 04/23/2022   ANIONGAP 5 04/27/2022    CBG (last 3)  No results for input(s): "GLUCAP" in the last 72 hours.    Coagulation Profile: Recent Labs  Lab 04/23/22 0456 04/24/22 0508 04/25/22 0455 04/26/22 0501 04/27/22 0540  INR 1.1 1.2 1.2 1.2 1.2      Radiology Studies: No results found.     Hosie Poisson M.D. [facility] 04/27/2022, 5:56 PM  Available via Epic secure chat 7am-7pm After 7 pm, please refer to night coverage provider listed on amion.

## 2022-04-27 NOTE — Progress Notes (Signed)
Upon turning patient a 3x1cm, stage 3, pressure ulcer found on patients coccygeal area. Area cleaned and a new foam dressing put in place. MD notified and WOC consult placed. See new orders.  Pt also found to have a maroon stool at this time and a fever of 100.3 orally. MD aware.

## 2022-04-28 DIAGNOSIS — J9601 Acute respiratory failure with hypoxia: Secondary | ICD-10-CM | POA: Diagnosis not present

## 2022-04-28 DIAGNOSIS — I5033 Acute on chronic diastolic (congestive) heart failure: Secondary | ICD-10-CM | POA: Diagnosis not present

## 2022-04-28 DIAGNOSIS — I10 Essential (primary) hypertension: Secondary | ICD-10-CM | POA: Diagnosis not present

## 2022-04-28 DIAGNOSIS — G9341 Metabolic encephalopathy: Secondary | ICD-10-CM | POA: Diagnosis not present

## 2022-04-28 LAB — BASIC METABOLIC PANEL
Anion gap: 5 (ref 5–15)
BUN: 51 mg/dL — ABNORMAL HIGH (ref 8–23)
CO2: 28 mmol/L (ref 22–32)
Calcium: 8.7 mg/dL — ABNORMAL LOW (ref 8.9–10.3)
Chloride: 103 mmol/L (ref 98–111)
Creatinine, Ser: 1.25 mg/dL — ABNORMAL HIGH (ref 0.61–1.24)
GFR, Estimated: 56 mL/min — ABNORMAL LOW (ref >60)
Glucose, Bld: 94 mg/dL (ref 70–99)
Potassium: 3.9 mmol/L (ref 3.5–5.1)
Sodium: 136 mmol/L (ref 135–145)

## 2022-04-28 LAB — PROTIME-INR
INR: 1.2 (ref 0.8–1.2)
Prothrombin Time: 15.4 seconds — ABNORMAL HIGH (ref 11.4–15.2)

## 2022-04-28 LAB — HEMOGLOBIN AND HEMATOCRIT, BLOOD
HCT: 26.9 % — ABNORMAL LOW (ref 39.0–52.0)
Hemoglobin: 8.5 g/dL — ABNORMAL LOW (ref 13.0–17.0)

## 2022-04-28 LAB — MAGNESIUM: Magnesium: 1.8 mg/dL (ref 1.7–2.4)

## 2022-04-28 NOTE — Progress Notes (Signed)
   04/28/22 1320  Assess: if the MEWS score is Yellow or Red  Were vital signs taken at a resting state? Yes  Focused Assessment No change from prior assessment  Does the patient meet 2 or more of the SIRS criteria? Yes  Does the patient have a confirmed or suspected source of infection? Yes  Provider and Rapid Response Notified? No  MEWS guidelines implemented *See Row Information* Yes  Take Vital Signs  Increase Vital Sign Frequency  Yellow: Q 2hr X 2 then Q 4hr X 2, if remains yellow, continue Q 4hrs  Escalate  MEWS: Escalate Yellow: discuss with charge nurse/RN and consider discussing with provider and RRT  Notify: Charge Nurse/RN  Name of Charge Nurse/RN Notified Megan B RN  Date Charge Nurse/RN Notified 04/28/22  Time Charge Nurse/RN Notified 20

## 2022-04-28 NOTE — Progress Notes (Signed)
RT NOTE:   NIF: -40 VC: 1.1L  Great patient effort.

## 2022-04-28 NOTE — Progress Notes (Signed)
Physical Therapy Treatment Patient Details Name: Daniel Sharp MRN: [id number] DOB: [birth year] Today's Date: 04/28/2022   History of Present Illness Patient is a 88 y.o. male presenting [facility] ED on 10/5 with hematuria, subacute upper GI bleed and sepsis due to hemorrhagic cystitis. PMHx significant for L renal cell carcinoma s/p L nephrectomy 01/22/22 (did not undergo chemo or radiation), Afib, OA, HTN, MG, back surgery, cholecystectomy, appendectomy, chronic iron deficiency, and recent fall with dislocated Rt finger, fractured Rt foot, Rt 5th rib fracture. Rapid response on 10/9 for decreased responses. He underwent brain imaging and was found to have [place], dural based mass in the right frontal lobe consistent with typical meningioma, and multiple small microhemorrhages consistent with cerebral amyloid angiopathy considered mild in severity. He was evaluated by neurosurgery due to these findings and recommended to have no further intervention or work-up for the meningioma/dural based mass.  SAH was also considered stable.    PT Comments     Pt requiring total assist +2 for bed mobility and unable to stand from elevated surface with knees blocked (utilized stedy).  Pt requiring maximove lift to transfer OOB to recliner.     Recommendations for follow up therapy are one component of a multi-disciplinary discharge planning process, led by the attending physician.  Recommendations may be updated based on patient status, additional functional criteria and insurance authorization.  Follow Up Recommendations  Skilled nursing-short term rehab (<3 hours/day) Can patient physically be transported by private vehicle: No   Assistance Recommended at Discharge Frequent or constant Supervision/Assistance  Patient can return home with the following Help with stairs or ramp for entrance;Assistance with cooking/housework;Assist for transportation;Two people to help with walking and/or transfers;A lot of help with  bathing/dressing/bathroom   Equipment Recommendations  None recommended by PT    Recommendations for Other Services       Precautions / Restrictions Precautions Precautions: Fall     Mobility  Bed Mobility Overal bed mobility: Needs Assistance Bed Mobility: Rolling, Sidelying to Sit Rolling: Max assist, +2 for physical assistance Sidelying to sit: Total assist, +2 for physical assistance       General bed mobility comments: cues for technique, provided time and cues for technique; pt required multimodal cues and increased assist    Transfers Overall transfer level: Needs assistance   Transfers: Sit to/from Stand Sit to Stand: Total assist, +2 physical assistance, From elevated surface           General transfer comment: pt unable to stand from elevated bed surface using stedy equipment, attempted x3 but pt unable to assist at all; pt assisted back to supine and rolled for lift pad placement; pt required maximove lift for OOB Transfer via Lift Equipment: Maximove  Ambulation/Gait                   Stairs             Wheelchair Mobility    Modified Rankin (Stroke Patients Only)       Balance Overall balance assessment: Needs assistance Sitting-balance support: Bilateral upper extremity supported, Feet supported Sitting balance-Leahy Scale: Poor Sitting balance - Comments: pt with improved midline trunk alignment today compared to previous session and better able to self support with Bil UE supported                                    Cognition Arousal/Alertness: Awake/alert Behavior During Therapy:  Flat affect Overall Cognitive Status: Impaired/Different from baseline Area of Impairment: Following commands, Problem solving                       Following Commands: Follows one step commands with increased time     Problem Solving: Slow processing, Decreased initiation General Comments: noted to have increased time  to respond to questions.  daughter and granddaughter present for session and providing encouragement        Exercises      General Comments        Pertinent Vitals/Pain Pain Assessment Pain Assessment: Faces Faces Pain Scale: Hurts even more Pain Location: "everywhere, joints" with mobility Pain Descriptors / Indicators: Sore, Aching Pain Intervention(s): Monitored during session, Repositioned    Home Living                          Prior Function            PT Goals (current goals can now be found in the care plan section) Acute Rehab PT Goals PT Goal Formulation: With patient/family Time For Goal Achievement: 05/12/22 Potential to Achieve Goals: Fair Progress towards PT goals: Progressing toward goals    Frequency    Min 2X/week      PT Plan Current plan remains appropriate    Co-evaluation              AM-PAC PT "6 Clicks" Mobility   Outcome Measure  Help needed turning from your back to your side while in a flat bed without using bedrails?: Total Help needed moving from lying on your back to sitting on the side of a flat bed without using bedrails?: Total Help needed moving to and from a bed to a chair (including a wheelchair)?: Total Help needed standing up from a chair using your arms (e.g., wheelchair or bedside chair)?: Total Help needed to walk in hospital room?: Total Help needed climbing 3-5 steps with a railing? : Total 6 Click Score: 6    End of Session Equipment Utilized During Treatment: Gait belt Activity Tolerance: Patient limited by fatigue Patient left: in chair;with call bell/phone within reach;with chair alarm set;with family/visitor present Nurse Communication: Mobility status;Need for lift equipment PT Visit Diagnosis: Muscle weakness (generalized) (M62.81);Difficulty in walking, not elsewhere classified (R26.2)     Time: 1202-1239 PT Time Calculation (min) (ACUTE ONLY): 37 min  Charges:  $Therapeutic Activity:  23-37 mins                     Jannette Spanner PT, DPT Physical Therapist Acute Rehabilitation Services Preferred contact method: Secure Chat Weekend Pager Only: [phone] Office: [place] 04/28/2022, 3:31 PM

## 2022-04-28 NOTE — Plan of Care (Signed)
  Problem: Education: Goal: Knowledge of General Education information will improve Description: Including pain rating scale, medication(s)/side effects and non-pharmacologic comfort measures Outcome: Completed/Met

## 2022-04-28 NOTE — Progress Notes (Signed)
[facility]                                                                               Daniel Sharp, is a 88 y.o. 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  He was admitted on 04/11/22 with melanotic stools and gross hematuria.  He had been undergoing outpatient GI evaluation for EGD and colonoscopy. After admission he became grossly weaker with worsened respiratory status as well.  He was found to have hypercarbic respiratory failure and transferred to the ICU.  Pulmonology and neurology were also consulted.  He was placed on BiPAP with improvement in his mentation and respiratory status.  He was also noted to have volume overload and was started on diuresis, again with good response. He was not felt to warrant IVIG. He underwent brain imaging and was found to have [place], dural based mass in the right frontal lobe consistent with typical meningioma, and multiple small microhemorrhages consistent with cerebral amyloid angiopathy considered mild in severity. He was evaluated by neurosurgery due to these findings and recommended to have no further intervention or work-up for the meningioma/dural based mass.  SAH was also considered stable.   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  Mass partially circumferential and measuring at least 3 cm in length.  Oozing was noted to be present and evidence of 2 areas of clot.  This  was felt to be the culprit for his rectal bleeding and will have ongoing risk for further bleeding until definitively treated.  Therefore in setting of ongoing anemia requiring blood transfusions, anticoagulation will continue to be held Pelvic MRI shows 4.1 cm polypoid right anterior lower rectal mass extending to anorectal junction surgery , Oncology is on board. Son is hopeful to have the surgery done prior to discharge and inpatient given his complexity with MG and clearance needed, esp with anticoag now on hold; patient has already been cleared as necessary by neurology (see last progress note from 10/12 and 10/13 from Dr. Curly Shores).  Cardiology clearance requested and recommendations given.  Discussed with the patient's son at bedside.  Surgery team discussed the plan with the patient's son regarding having the procedure done at a tertiary center. Referrals will be made by the surgeon.   Hematuria:  Resolved.  BPH with LUTS.  RCC s/p left nephrectomy on 01/2022  On tamsulosin and finasteride.  Urology consulted and recommendations given.   Acute hypoxic and hypercarbic respiratory failure: Multifactorial- hypercarbia from MG as well as fluid overload. Patient has been using BIPAP since 04/15/2022.  Patient hasn't used CPAP or BIPAP in the last 3 days,  appears comfortable, weaned off oxygen and is on RA.  Currently not in distress.   Acute on chronic diastolic CHF New echo during hospitalization; EF 55%, no RWMA; severe concentric LVH.  Indeterminate diastolic parameters.  Mild enlarged right ventricle Restarted him on low dose lasix 20 mg daily.  He has diuresed about 13 lit since admission.   Fever/  enterococcus bacteremia  CXR unchanged from previous.   UA is negative.  Blood cultures  2/2 bottles growing enterococcus, probably GI source.  Marland Kitchen He was started on ampicillin , ID on  board. Plan to repeat blood cultures in am and check TTE. TTE does not show any vegetations. Dr Tommy Medal  recommends TEE  No chills or fever today. Continue to monitor.  WILL request cardiology for TEE.    Stage 3b CKD:  Creatinine is better than baseline.  Baseline between 1.3 to 1.6.  Continue to monitor.     SAH / Meningioma:  Appreciate neurosurgery input.  SAH stable and there are multiple small "microhemorrhages". This has been discussed with neurology (Dr. Curly Shores). The microhemorrhages are felt moreso related to long term anticoagulation since 2007 and less likely from [place] (as originally proposed from [place]). He is felt to NOT have contraindication to anticoagulation in this context    PAF Rate controlled, on anti coagulation which was held for bleeding.  Son would like the anti coagulation to be held for now , in fear to recurrent bleeding from the rectal ca.    Acute anemia of blood loss from rectal bleeding / hematuria in the setting of  anemia of chronic disease.  Transfuse to keep hemoglobin greater than 7.  Hemoglobin today is around 7.4. s/p 1 unit prbc transfusion and hemoglobin improved to 8.5.  Continue to monitor.     Hypertension:  BP parameters are optimal.    NSVT;   Ocular Myasthenia Gravis:  - on prednisone 10 mg and mestinon.  - continue with NIF'S as per neurology.    Acute metabolic encephalopathy:  due to hypercarbia and deconditioning; some component of hospital delirium and cannot fully rule out hypercalcemia playing a factor Appears to have improved. Alert and answering questions appropriately.    Deconditioning , Generalized weakness:  - therapy eval recommending SNF.  - pt unable t stand and is weaning towards right.   CT head unchanged.        RN Pressure Injury Documentation: Pressure Injury Coccyx Mid Deep Tissue Pressure Injury - Purple or maroon localized area of discolored intact skin or blood-filled blister due to damage of underlying soft tissue from pressure and/or shear. (Active)     Location: Coccyx  Location Orientation:  Mid  Staging: Deep Tissue Pressure Injury - Purple or maroon localized area of discolored intact skin or blood-filled blister due to damage of underlying soft tissue from pressure and/or shear.  Wound Description (Comments):   Present on Admission: Yes  Dressing Type Foam - Lift dressing to assess site every shift;Impregnated gauze (bismuth) 04/28/22 0945     Pressure Injury 04/15/22 Heel Right Stage 1 -  Intact skin with non-blanchable redness of a localized area usually over a bony prominence. (Active)  04/15/22 1105  Location: Heel  Location Orientation: Right  Staging: Stage 1 -  Intact skin with non-blanchable redness of a localized area usually over a bony prominence.  Wound Description (Comments):   Present on Admission: No  Dressing Type Foam - Lift dressing to assess site every shift 04/28/22 0945   WOUND  CARE CONSULTED AND RECOMMENDATIONS GIVEN.   Malnutrition Type:  Nutrition Problem: Inadequate oral intake Etiology: other (see comment) (difficulty breathing and AMS)   Malnutrition Characteristics:  Signs/Symptoms: NPO status   Nutrition Interventions:  Interventions: Other (Comment) (advance diet per SLP recommendations and provide appropriate ONS)  Estimated body mass index is 25.07 kg/m as calculated from the following:   Height as of this encounter: 6' 0.5" (1.842 m).   Weight as of this encounter: 85 kg.  Code Status: PARTIAL DVT Prophylaxis:  SCDs Start: 04/11/22 2325   Level of Care: Level of care: Progressive Family Communication: none at bedside.  Disposition Plan:     Remains inpatient appropriate:  IV ampicillin for enterococcus bacteremia.      Consultants:   General surgery.  Neurology PCCM.    Antimicrobials:   Anti-infectives (From admission, onward)    Start     Dose/Rate Route Frequency Ordered Stop   04/25/22 1500  ampicillin (OMNIPEN) 2 g in sodium chloride 0.9 % 100 mL IVPB        2 g 300 mL/hr over 20 Minutes Intravenous  Every 6 hours 04/25/22 1405     04/16/22 1115  ceFEPIme (MAXIPIME) 2 g in sodium chloride 0.9 % 100 mL IVPB  Status:  Discontinued        2 g 200 mL/hr over 30 Minutes Intravenous Every 12 hours 04/16/22 1018 04/17/22 1506   04/12/22 1800  cefTRIAXone (ROCEPHIN) 1 g in sodium chloride 0.9 % 100 mL IVPB  Status:  Discontinued        1 g 200 mL/hr over 30 Minutes Intravenous Every 24 hours 04/11/22 2329 04/16/22 0947   04/11/22 2300  cefTRIAXone (ROCEPHIN) 1 g in sodium chloride 0.9 % 100 mL IVPB        1 g 200 mL/hr over 30 Minutes Intravenous  Once 04/11/22 2256 04/11/22 2350        Medications  Scheduled Meds:  feeding supplement  237 mL Oral Q24H   finasteride  5 mg Oral Daily   furosemide  20 mg Oral Daily   mouth rinse  15 mL Mouth Rinse 4 times per day   pantoprazole  40 mg Oral BID   predniSONE  10 mg Oral Daily   pyridostigmine  60 mg Oral QID   tamsulosin  0.4 mg Oral Daily   Continuous Infusions:  ampicillin (OMNIPEN) IV 2 g (04/28/22 1022)   PRN Meds:.acetaminophen **OR** acetaminophen, mouth rinse    Subjective:   Daniel Sharp was seen and examined today.  No new complaints.   Objective:   Vitals:   04/27/22 1810 04/27/22 2116 04/28/22 0500 04/28/22 0507  BP: 119/71 135/74  131/84  Pulse: (!) 110 98  100  Resp: 20 (!) 22  (!) 22  Temp: 98.9 F (37.2 C) 99 F (37.2 C)  98.6 F (37 C)  TempSrc: Oral Oral  Oral  SpO2: 93% 95%  94%  Weight:   85 kg   Height:        Intake/Output Summary (Last 24 hours) at 04/28/2022 1131 Last data filed at 04/28/2022 0753 Gross per 24 hour  Intake 842 ml  Output 1000 ml  Net -158 ml    Filed Weights   04/25/22 0500 04/26/22 0440 04/28/22 0500  Weight: 86.4 kg 84.7 kg 85 kg     Exam  General exam: Appears calm and comfortable  Respiratory system: Clear to auscultation. Respiratory effort normal. Cardiovascular system: S1 & S2 heard, RRR. No JVD,  No pedal edema. Gastrointestinal system: Abdomen is  nondistended, soft and nontender.  Central nervous system: Alert and oriented. No focal neurological deficits. Extremities: Symmetric 5 x 5 power. Skin: No rashes, lesions or ulcers Psychiatry:  Mood & affect appropriate.        Data Reviewed:  I have personally reviewed following labs and imaging studies   CBC Lab Results  Component Value Date   WBC 11.7 (H) 04/27/2022   RBC 2.52 (L) 04/27/2022   HGB 8.5 (L) 04/28/2022   HCT 26.9 (L) 04/28/2022   MCV 94.0 04/27/2022   MCH 29.4 04/27/2022   PLT 188 04/27/2022   MCHC 31.2 04/27/2022   RDW 15.8 (H) 04/27/2022   LYMPHSABS 1.3 04/27/2022   MONOABS 1.1 (H) 04/27/2022   EOSABS 0.1 04/27/2022   BASOSABS 0.0 22/08/5425     Last metabolic panel Lab Results  Component Value Date   NA 136 04/28/2022   K 3.9 04/28/2022   CL 103 04/28/2022   CO2 28 04/28/2022   BUN 51 (H) 04/28/2022   CREATININE 1.25 (H) 04/28/2022   GLUCOSE 94 04/28/2022   GFRNONAA 56 (L) 04/28/2022   CALCIUM 8.7 (L) 04/28/2022   PHOS 2.7 04/18/2022   PROT 5.4 (L) 04/23/2022   ALBUMIN 2.3 (L) 04/23/2022   BILITOT 0.9 04/23/2022   ALKPHOS 52 04/23/2022   AST 20 04/23/2022   ALT 17 04/23/2022   ANIONGAP 5 04/28/2022    CBG (last 3)  No results for input(s): "GLUCAP" in the last 72 hours.    Coagulation Profile: Recent Labs  Lab 04/24/22 0508 04/25/22 0455 04/26/22 0501 04/27/22 0540 04/28/22 0521  INR 1.2 1.2 1.2 1.2 1.2      Radiology Studies: No results found.     Hosie Poisson M.D. [facility] 04/28/2022, 11:31 AM  Available via Epic secure chat 7am-7pm After 7 pm, please refer to night coverage provider listed on amion.

## 2022-04-28 NOTE — Progress Notes (Signed)
NIF -34 VC 1.5 L Pt did well on second attempt. Pt refused bipap again tonight.

## 2022-04-29 ENCOUNTER — Inpatient Hospital Stay (HOSPITAL_COMMUNITY): Payer: Medicare HMO

## 2022-04-29 DIAGNOSIS — R609 Edema, unspecified: Secondary | ICD-10-CM

## 2022-04-29 DIAGNOSIS — R7881 Bacteremia: Secondary | ICD-10-CM

## 2022-04-29 DIAGNOSIS — J9601 Acute respiratory failure with hypoxia: Secondary | ICD-10-CM | POA: Diagnosis not present

## 2022-04-29 DIAGNOSIS — G9341 Metabolic encephalopathy: Secondary | ICD-10-CM | POA: Diagnosis not present

## 2022-04-29 DIAGNOSIS — I5033 Acute on chronic diastolic (congestive) heart failure: Secondary | ICD-10-CM | POA: Diagnosis not present

## 2022-04-29 LAB — CBC WITH DIFFERENTIAL/PLATELET
Abs Immature Granulocytes: 0.09 10*3/uL — ABNORMAL HIGH (ref 0.00–0.07)
Basophils Absolute: 0 10*3/uL (ref 0.0–0.1)
Basophils Relative: 0 %
Eosinophils Absolute: 0 10*3/uL (ref 0.0–0.5)
Eosinophils Relative: 0 %
HCT: 27.7 % — ABNORMAL LOW (ref 39.0–52.0)
Hemoglobin: 8.3 g/dL — ABNORMAL LOW (ref 13.0–17.0)
Immature Granulocytes: 1 %
Lymphocytes Relative: 9 %
Lymphs Abs: 1.1 10*3/uL (ref 0.7–4.0)
MCH: 29 pg (ref 26.0–34.0)
MCHC: 30 g/dL (ref 30.0–36.0)
MCV: 96.9 fL (ref 80.0–100.0)
Monocytes Absolute: 0.9 10*3/uL (ref 0.1–1.0)
Monocytes Relative: 7 %
Neutro Abs: 11 10*3/uL — ABNORMAL HIGH (ref 1.7–7.7)
Neutrophils Relative %: 83 %
Platelets: 220 10*3/uL (ref 150–400)
RBC: 2.86 MIL/uL — ABNORMAL LOW (ref 4.22–5.81)
RDW: 15.8 % — ABNORMAL HIGH (ref 11.5–15.5)
WBC: 13.2 10*3/uL — ABNORMAL HIGH (ref 4.0–10.5)
nRBC: 0 % (ref 0.0–0.2)

## 2022-04-29 LAB — BPAM RBC
Blood Product Expiration Date: 202311092359
ISSUE DATE / TIME: 202310211745
Unit Type and Rh: 6200

## 2022-04-29 LAB — PROTIME-INR
INR: 1.2 (ref 0.8–1.2)
Prothrombin Time: 15.1 seconds (ref 11.4–15.2)

## 2022-04-29 LAB — BASIC METABOLIC PANEL
Anion gap: 6 (ref 5–15)
BUN: 53 mg/dL — ABNORMAL HIGH (ref 8–23)
CO2: 27 mmol/L (ref 22–32)
Calcium: 9.1 mg/dL (ref 8.9–10.3)
Chloride: 103 mmol/L (ref 98–111)
Creatinine, Ser: 1.34 mg/dL — ABNORMAL HIGH (ref 0.61–1.24)
GFR, Estimated: 51 mL/min — ABNORMAL LOW (ref >60)
Glucose, Bld: 108 mg/dL — ABNORMAL HIGH (ref 70–99)
Potassium: 4 mmol/L (ref 3.5–5.1)
Sodium: 136 mmol/L (ref 135–145)

## 2022-04-29 LAB — TYPE AND SCREEN
ABO/RH(D): A POS
Antibody Screen: NEGATIVE
Unit division: 0

## 2022-04-29 LAB — MAGNESIUM: Magnesium: 2 mg/dL (ref 1.7–2.4)

## 2022-04-29 MED ORDER — LINEZOLID 600 MG PO TABS
600.0000 mg | ORAL_TABLET | Freq: Two times a day (BID) | ORAL | Status: DC
  Filled 2022-04-29: qty 1

## 2022-04-29 NOTE — Progress Notes (Signed)
    [facility] has been requested to perform a transesophageal echocardiogram on [name] for [place].  After careful review of history and examination, the risks and benefits of transesophageal echocardiogram have been explained including risks of esophageal damage, perforation (1:10,000 risk), bleeding, pharyngeal hematoma as well as other potential complications associated with conscious sedation including aspiration, arrhythmia, respiratory failure and death. Alternatives to treatment were discussed, questions were answered. Patient is willing to proceed.   Also discussed risk and benefits to Daniel Sharp, son, at [phone].    TEE - Dr. Johney Frame 05/01/22 @ 1300. NPO after midnight. Meds with sips.   Leanor Kail, PA-C 04/29/2022 10:08 AM

## 2022-04-29 NOTE — Progress Notes (Signed)
NIF: -30  VC: 1.2L  Much better patient effort tonight.

## 2022-04-29 NOTE — Progress Notes (Signed)
Patient refused cpap at this time.  Patient stated he wanted to be comfortable and he wasn't and didn't know when that would be.

## 2022-04-29 NOTE — Progress Notes (Signed)
VASCULAR LAB    Left lower extremity venous duplex has been performed.  See CV proc for preliminary results.   Marlisa Caridi, RVT 04/29/2022, 10:28 AM

## 2022-04-29 NOTE — Progress Notes (Signed)
Pt performed a NIF of -32 on first attempt and a VC of 300 ml's on third attempt with poor effort. Pt didn't seem to understand he had to make a seal before he could blow even with coaching.

## 2022-04-29 NOTE — Progress Notes (Signed)
[facility]   Reason for visit: Follow up on bacteremia  Interval History:  Isolated hospital onset fever had resolved for several days on appropriate abx 10/18 bcx positive 10/20 repeat bcx negative Tte no obvious vegetation  Awaiting rehab discharge Patient to have televisit appointment with colorectal surgery to discuss further management of his rectal mass  He reports clinically improving with abx treatment  No diarrhea No focal joint/back pain No headache/visual change  Physical Exam: Constitutional:  Vitals:   04/29/22 1248 04/29/22 1422  BP: 133/69   Pulse: (!) 103 82  Resp: (!) 22   Temp: 99.3 F (37.4 C)   SpO2: 91%   General/constitutional: no distress, pleasant; appears to be diffusely weak; conversant; son by bedside HEENT: Normocephalic, PER, Conj Clear, EOMI, Oropharynx clear Neck supple CV: rrr no mrg Lungs: clear to auscultation, normal respiratory effort Abd: Soft, Nontender Ext: no edema Skin: No Rash Neuro: generalized weakness; nonfocal otherwise MSK: no peripheral joint swelling/tenderness/warmth; back spines nontender   Labs: Lab Results  Component Value Date   WBC 13.2 (H) 04/29/2022   HGB 8.3 (L) 04/29/2022   HCT 27.7 (L) 04/29/2022   MCV 96.9 04/29/2022   PLT 220 82/50/5397   Last metabolic panel Lab Results  Component Value Date   GLUCOSE 108 (H) 04/29/2022   NA 136 04/29/2022   K 4.0 04/29/2022   CL 103 04/29/2022   CO2 27 04/29/2022   BUN 53 (H) 04/29/2022   CREATININE 1.34 (H) 04/29/2022   GFRNONAA 51 (L) 04/29/2022   CALCIUM 9.1 04/29/2022   PHOS 2.7 04/18/2022   PROT 5.4 (L) 04/23/2022   ALBUMIN 2.3 (L) 04/23/2022   BILITOT 0.9 04/23/2022   ALKPHOS 52 04/23/2022   AST 20 04/23/2022   ALT 17 04/23/2022   ANIONGAP 6 04/29/2022   Imaging: Reviewed/incorporated into decision making   10/10 brain mri 1. Small-volume acute subarachnoid hemorrhage overlying the posterior right frontal  lobe, unchanged from the prior head CT of 04/15/2022. 2. 13 x 4 mm dural-based mass overlying the right frontal lobe, likely reflecting a meningioma. No significant mass effect upon the underlying brain parenchyma. 3. Chronic lacunar infarcts in the bilateral cerebral hemispheric white matter. 4. Background mild cerebral white matter and pontine chronic small vessel ischemic disease. 5. 3-4 mm lesion within the anterior right frontal lobe, which may reflect a cavernoma or chronic microhemorrhage. 6. Several chronic microhemorrhages scattered elsewhere within the supratentorial and infratentorial brain. 7. Mild-to-moderate generalized cerebral atrophy. 8. Mild cerebellar atrophy.   10/10 chest ct 1. Moderate bilateral pleural effusions and associated atelectasis or consolidation, increased when compared to the lung bases on examination dated 04/11/2022. 2. Diffuse bilateral bronchial wall thickening, ground-glass airspace opacity and interseptal lobular thickening, most consistent with pulmonary edema. 3. Cardiomegaly and coronary artery disease. 4. Enlargement of the main pulmonary artery, as can be seen in pulmonary hypertension.  10/18 cxr COMPARISON:  04/19/2022   FINDINGS: Asymmetric airspace disease right greater than left unchanged. Small right effusion has progressed. No left effusion. Prior median sternotomy and valve replacement   IMPRESSION: Asymmetric airspace disease right greater than left unchanged. Progression of small right effusion. Findings may represent pneumonia or asymmetric pulmonary edema.   10/19 limited tte  1. Limited study without doppler; s/p MV repair and MAC/subvalvular  calcification noted but no obvious vegetations.   2. Left ventricular ejection fraction, by estimation, is 65 to 70%. The  left ventricle has normal function. The left ventricle has  no regional  wall motion abnormalities.   3. Right ventricular systolic function is normal.  The right ventricular  size is normal.   4. Left atrial size was moderately dilated.   5. Right atrial size was mildly dilated.   6. The mitral valve has been repaired/replaced. Procedure Date: 2005.   7. The aortic valve is tricuspid.    Impression:  88 yo hx mv repair, hx left nephrectomy for renal cancer 01/2022, pAfib on coumadin, chronic right sided heart failure, myasthenia gravis on prednisone, ckd3,  admitted for chronic blood loss anemia in setting dark colored stool and microscopic hematuria, course complicated by hospital onset sepsis/e faecalis bacteremia in setting of new found rectal mass that had undergone biopsy/colonoscopy this admission  Clear source of sepsis and timing. Low risk for IE and negative tte and repeat bcx could defer tee  He needs to get stronger and get his rectal mass/cancer taken care of quickly    Plan: 1.  Continue with ampicillin for now; when discharge could transition to linezolid 600 mg po bid to finish 14 day course of treatment, starting 10/20 until 11/03 2.  Ok to discharge from id standpoint when ready per primary team 3.  Will sign off 4.  Discuss with primary team    I spent more than 35 minute reviewing data/chart, and coordinating care and >50% direct face to face time providing counseling/discussing diagnostics/treatment plan with patient        Jabier Mutton, [place] [facility] [place] [phone]  pager   [phone] cell 04/29/2022, 4:12 PM

## 2022-04-29 NOTE — Progress Notes (Signed)
[facility]                                                                               Daniel Sharp, is a 88 y.o. 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  He was admitted on 04/11/22 with melanotic stools and gross hematuria.  He had been undergoing outpatient GI evaluation for EGD and colonoscopy. After admission he became grossly weaker with worsened respiratory status as well.  He was found to have hypercarbic respiratory failure and transferred to the ICU.  Pulmonology and neurology were also consulted.  He was placed on BiPAP with improvement in his mentation and respiratory status.  He was also noted to have volume overload and was started on diuresis, again with good response. He was not felt to warrant IVIG. He underwent brain imaging and was found to have [place], dural based mass in the right frontal lobe consistent with typical meningioma, and multiple small microhemorrhages consistent with cerebral amyloid angiopathy considered mild in severity. He was evaluated by neurosurgery due to these findings and recommended to have no further intervention or work-up for the meningioma/dural based mass.  SAH was also considered stable.   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  Mass partially circumferential and measuring at least 3 cm in length.  Oozing was noted to be present and evidence of 2 areas of clot.  This  was felt to be the culprit for his rectal bleeding and will have ongoing risk for further bleeding until definitively treated.  Therefore in setting of ongoing anemia requiring blood transfusions, anticoagulation will continue to be held Pelvic MRI shows 4.1 cm polypoid right anterior lower rectal mass extending to anorectal junction surgery , Oncology is on board. Son is hopeful to have the surgery done prior to discharge and inpatient given his complexity with MG and clearance needed, esp with anticoag now on hold; patient has already been cleared as necessary by neurology (see last progress note from 10/12 and 10/13 from Dr. Curly Shores).  Cardiology clearance requested and recommendations given.  Discussed with the patient's son at bedside.  Surgery team discussed the plan with the patient's son regarding having the procedure done at a tertiary center. Referrals will be made by the surgeon.   Hematuria:  Resolved.  BPH with LUTS.  RCC s/p left nephrectomy on 01/2022  On tamsulosin and finasteride.  Urology consulted and recommendations given.   Acute hypoxic and hypercarbic respiratory failure: Multifactorial- hypercarbia from MG as well as fluid overload. Patient has been using BIPAP since 04/15/2022.  Patient hasn't used CPAP or BIPAP in the last 3 days,  appears comfortable, weaned off oxygen and is on RA.  Currently not in distress.   Acute on chronic diastolic CHF New echo during hospitalization; EF 55%, no RWMA; severe concentric LVH.  Indeterminate diastolic parameters.  Mild enlarged right ventricle Restarted him on low dose lasix 20 mg daily.  He has diuresed about 13 lit since admission.   Fever/  enterococcus bacteremia  CXR unchanged from previous.   UA is negative.  Blood cultures  2/2 bottles growing enterococcus, probably GI source.  Marland Kitchen He was started on ampicillin , ID on  board. Plan to repeat blood cultures in am and check TTE. TTE does not show any vegetations. Dr Tommy Medal  recommends TEE  No chills or fever today. Continue to monitor.  WILL request cardiology for TEE.    Stage 3b CKD:  Creatinine is better than baseline.  Baseline between 1.3 to 1.6.  Continue to monitor.     SAH / Meningioma:  Appreciate neurosurgery input.  SAH stable and there are multiple small "microhemorrhages". This has been discussed with neurology (Dr. Curly Shores). The microhemorrhages are felt moreso related to long term anticoagulation since 2007 and less likely from [place] (as originally proposed from [place]). He is felt to NOT have contraindication to anticoagulation in this context    PAF Rate controlled, on anti coagulation which was held for bleeding.  Son would like the anti coagulation to be held for now , in fear to recurrent bleeding from the rectal ca.    Acute anemia of blood loss from rectal bleeding / hematuria in the setting of  anemia of chronic disease.  Transfuse to keep hemoglobin greater than 7.  Hemoglobin today is around 7.4. s/p 1 unit prbc transfusion and hemoglobin improved to 8.5.  Continue to monitor.     Hypertension:  BP parameters are optimal.    NSVT;   Ocular Myasthenia Gravis:  - on prednisone 10 mg and mestinon.  - continue with NIF'S as per neurology.    Acute metabolic encephalopathy:  due to hypercarbia and deconditioning; some component of hospital delirium and cannot fully rule out hypercalcemia playing a factor Appears to have improved. Alert and answering questions appropriately.    Deconditioning , Generalized weakness:  - therapy eval recommending SNF.  - pt unable t stand and is weaning towards right.   CT head unchanged.        RN Pressure Injury Documentation: Pressure Injury Coccyx Mid Deep Tissue Pressure Injury - Purple or maroon localized area of discolored intact skin or blood-filled blister due to damage of underlying soft tissue from pressure and/or shear. (Active)     Location: Coccyx  Location Orientation:  Mid  Staging: Deep Tissue Pressure Injury - Purple or maroon localized area of discolored intact skin or blood-filled blister due to damage of underlying soft tissue from pressure and/or shear.  Wound Description (Comments):   Present on Admission: Yes  Dressing Type Foam - Lift dressing to assess site every shift 04/29/22 0740     Pressure Injury 04/15/22 Heel Right Stage 1 -  Intact skin with non-blanchable redness of a localized area usually over a bony prominence. (Active)  04/15/22 1105  Location: Heel  Location Orientation: Right  Staging: Stage 1 -  Intact skin with non-blanchable redness of a localized area usually over a bony prominence.  Wound Description (Comments):   Present on Admission: No  Dressing Type Foam - Lift dressing to assess site every shift 04/29/22 0740   WOUND CARE CONSULTED  AND RECOMMENDATIONS GIVEN.   Malnutrition Type:  Nutrition Problem: Inadequate oral intake Etiology: other (see comment) (difficulty breathing and AMS)   Malnutrition Characteristics:  Signs/Symptoms: NPO status   Nutrition Interventions:  Interventions: Other (Comment) (advance diet per SLP recommendations and provide appropriate ONS)  Estimated body mass index is 25.36 kg/m as calculated from the following:   Height as of this encounter: 6' 0.5" (1.842 m).   Weight as of this encounter: 86 kg.  Code Status: PARTIAL DVT Prophylaxis:  Place and maintain sequential compression device Start: 04/29/22 0939 SCDs Start: 04/11/22 2325   Level of Care: Level of care: Progressive Family Communication: none at bedside.  Disposition Plan:     Remains inpatient appropriate:  IV ampicillin for enterococcus bacteremia.      Consultants:   General surgery.  Neurology PCCM.    Antimicrobials:   Anti-infectives (From admission, onward)    Start     Dose/Rate Route Frequency Ordered Stop   04/25/22 1500  ampicillin (OMNIPEN) 2 g in sodium chloride 0.9 % 100 mL IVPB        2  g 300 mL/hr over 20 Minutes Intravenous Every 6 hours 04/25/22 1405     04/16/22 1115  ceFEPIme (MAXIPIME) 2 g in sodium chloride 0.9 % 100 mL IVPB  Status:  Discontinued        2 g 200 mL/hr over 30 Minutes Intravenous Every 12 hours 04/16/22 1018 04/17/22 1506   04/12/22 1800  cefTRIAXone (ROCEPHIN) 1 g in sodium chloride 0.9 % 100 mL IVPB  Status:  Discontinued        1 g 200 mL/hr over 30 Minutes Intravenous Every 24 hours 04/11/22 2329 04/16/22 0947   04/11/22 2300  cefTRIAXone (ROCEPHIN) 1 g in sodium chloride 0.9 % 100 mL IVPB        1 g 200 mL/hr over 30 Minutes Intravenous  Once 04/11/22 2256 04/11/22 2350        Medications  Scheduled Meds:  feeding supplement  237 mL Oral Q24H   finasteride  5 mg Oral Daily   furosemide  20 mg Oral Daily   mouth rinse  15 mL Mouth Rinse 4 times per day   pantoprazole  40 mg Oral BID   predniSONE  10 mg Oral Daily   pyridostigmine  60 mg Oral QID   tamsulosin  0.4 mg Oral Daily   Continuous Infusions:  ampicillin (OMNIPEN) IV 2 g (04/29/22 1017)   PRN Meds:.acetaminophen **OR** acetaminophen, mouth rinse    Subjective:   Daniel Sharp was seen and examined today.  No new complaints.   Objective:   Vitals:   04/29/22 0103 04/29/22 0500 04/29/22 0510 04/29/22 0737  BP: (!) 115/59  131/70 (!) 145/73  Pulse: (!) 106  (!) 109 60  Resp: 20  20 (!) 24  Temp: 98.2 F (36.8 C)  98.4 F (36.9 C) 98.1 F (36.7 C)  TempSrc: Oral  Oral Oral  SpO2: 94%  96% 94%  Weight:  86 kg    Height:        Intake/Output Summary (Last 24 hours) at 04/29/2022 1139 Last data filed at 04/29/2022 0921 Gross per 24 hour  Intake 537 ml  Output 850 ml  Net -313 ml    Filed Weights   04/26/22 0440 04/28/22 0500 04/29/22 0500  Weight: 84.7 kg 85 kg 86 kg     Exam  General exam: Appears calm and comfortable  Respiratory system: Clear to auscultation. Respiratory effort normal.  Cardiovascular system: S1 & S2 heard, RRR. No JVD,  No pedal  edema. Gastrointestinal system: Abdomen is nondistended, soft and nontender.  Central nervous system: Alert and oriented. No focal neurological deficits. Extremities: Symmetric 5 x 5 power. Skin: No rashes, lesions or ulcers Psychiatry:  Mood & affect appropriate.        Data Reviewed:  I have personally reviewed following labs and imaging studies   CBC Lab Results  Component Value Date   WBC 11.7 (H) 04/27/2022   RBC 2.52 (L) 04/27/2022   HGB 8.5 (L) 04/28/2022   HCT 26.9 (L) 04/28/2022   MCV 94.0 04/27/2022   MCH 29.4 04/27/2022   PLT 188 04/27/2022   MCHC 31.2 04/27/2022   RDW 15.8 (H) 04/27/2022   LYMPHSABS 1.3 04/27/2022   MONOABS 1.1 (H) 04/27/2022   EOSABS 0.1 04/27/2022   BASOSABS 0.0 37/34/2876     Last metabolic panel Lab Results  Component Value Date   NA 136 04/29/2022   K 4.0 04/29/2022   CL 103 04/29/2022   CO2 27 04/29/2022   BUN 53 (H) 04/29/2022   CREATININE 1.34 (H) 04/29/2022   GLUCOSE 108 (H) 04/29/2022   GFRNONAA 51 (L) 04/29/2022   CALCIUM 9.1 04/29/2022   PHOS 2.7 04/18/2022   PROT 5.4 (L) 04/23/2022   ALBUMIN 2.3 (L) 04/23/2022   BILITOT 0.9 04/23/2022   ALKPHOS 52 04/23/2022   AST 20 04/23/2022   ALT 17 04/23/2022   ANIONGAP 6 04/29/2022    CBG (last 3)  No results for input(s): "GLUCAP" in the last 72 hours.    Coagulation Profile: Recent Labs  Lab 04/25/22 0455 04/26/22 0501 04/27/22 0540 04/28/22 0521 04/29/22 0435  INR 1.2 1.2 1.2 1.2 1.2      Radiology Studies: No results found.     Hosie Poisson M.D. [facility] 04/29/2022, 11:39 AM  Available via Epic secure chat 7am-7pm After 7 pm, please refer to night coverage provider listed on amion.

## 2022-04-29 NOTE — Plan of Care (Signed)
  Problem: Health Behavior/Discharge Planning: Goal: Ability to manage health-related needs will improve Outcome: Progressing   Problem: Clinical Measurements: Goal: Ability to maintain clinical measurements within normal limits will improve Outcome: Progressing   

## 2022-04-30 ENCOUNTER — Telehealth: Payer: Self-pay | Admitting: Pulmonary Disease

## 2022-04-30 DIAGNOSIS — J9601 Acute respiratory failure with hypoxia: Secondary | ICD-10-CM

## 2022-04-30 DIAGNOSIS — G7000 Myasthenia gravis without (acute) exacerbation: Secondary | ICD-10-CM | POA: Diagnosis not present

## 2022-04-30 DIAGNOSIS — R0689 Other abnormalities of breathing: Secondary | ICD-10-CM

## 2022-04-30 DIAGNOSIS — C20 Malignant neoplasm of rectum: Secondary | ICD-10-CM | POA: Diagnosis not present

## 2022-04-30 DIAGNOSIS — K922 Gastrointestinal hemorrhage, unspecified: Secondary | ICD-10-CM | POA: Diagnosis not present

## 2022-04-30 LAB — PROTIME-INR
INR: 1.3 — ABNORMAL HIGH (ref 0.8–1.2)
Prothrombin Time: 15.6 seconds — ABNORMAL HIGH (ref 11.4–15.2)

## 2022-04-30 MED ORDER — LINEZOLID 600 MG PO TABS: 600.0000 mg | ORAL_TABLET | Freq: Two times a day (BID) | ORAL | 0 refills | Status: DC

## 2022-04-30 MED ORDER — ENSURE ENLIVE PO LIQD: 237.0000 mL | ORAL | 12 refills | Status: AC

## 2022-04-30 MED ORDER — PANTOPRAZOLE SODIUM 40 MG PO TBEC: 40.0000 mg | DELAYED_RELEASE_TABLET | Freq: Two times a day (BID) | ORAL | 2 refills | Status: AC

## 2022-04-30 MED ORDER — SODIUM CHLORIDE 0.9 % IV SOLN
2.0000 g | Freq: Four times a day (QID) | INTRAVENOUS | Status: DC
  Administered 2022-04-30: 2 g via INTRAVENOUS
  Filled 2022-04-30 (×4): qty 2000

## 2022-04-30 MED ORDER — FINASTERIDE 5 MG PO TABS: 5.0000 mg | ORAL_TABLET | Freq: Every day | ORAL | 1 refills | Status: AC

## 2022-04-30 NOTE — Progress Notes (Signed)
NIF -30 

## 2022-04-30 NOTE — Progress Notes (Signed)
Report called to [place] at [facility], awaiting PTAR for transport.

## 2022-04-30 NOTE — Telephone Encounter (Signed)
ATC son but received a busy tone x2. Will call back later.

## 2022-04-30 NOTE — Discharge Summary (Addendum)
Physician Discharge Summary   Patient: Daniel Sharp MRN: [id number] DOB: 01-14-1933  Admit date:     04/11/2022  Discharge date: 04/30/22  Discharge Physician: Hosie Poisson   PCP: [name], MD   Recommendations at discharge:  PLEASE follow up with [facility] surgery referral.  I have put in a ambulatory referral for sleep study and ambulatory referral for pulmonology.  Please follow up with cardiology as scheduled.  Please follow up with Neurology as recommended.  Recommend follow up with Urology as scheduled.  Patient is full code for the purpose of the transport from the hospital to SNF.   Discharge Diagnoses: Active Problems:   GIB (gastrointestinal bleeding)   Acute respiratory failure with hypoxia and hypercapnia (HCC)   Rectal adenocarcinoma (HCC)   Sepsis (HCC)   Persistent atrial fibrillation (HCC)   Acute on chronic diastolic CHF (congestive heart failure) (HCC)   Renal cell carcinoma of left kidney (HCC)   Myasthenia gravis without acute exacerbation (HCC)   Hypertension   Hypercalcemia   IDA (iron deficiency anemia)   Gross hematuria   Hemorrhagic cystitis   Pressure injury of skin   Abnormal CT scan, colon   Acute metabolic encephalopathy   BPH (benign prostatic hyperplasia)   SAH (subarachnoid hemorrhage) (HCC)   Meningioma (HCC)   NSVT (nonsustained ventricular tachycardia) (HCC)   Chronic kidney disease, stage 3a (HCC)   Rectal mass   Tubular adenoma of colon   Bacteremia    Hospital Course:  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  He was admitted on 04/11/22 with melanotic stools and gross hematuria.  He had been undergoing outpatient GI evaluation for EGD and colonoscopy. After admission he became grossly weaker with worsened respiratory status as well.  He was found to have  hypercarbic respiratory failure and transferred to the ICU.  Pulmonology and neurology were also consulted.  He was placed on BiPAP with improvement in his mentation and respiratory status.  He was also noted to have volume overload and was started on diuresis, again with good response. He was not felt to warrant IVIG. He underwent brain imaging and was found to have [place], dural based mass in the right frontal lobe consistent with typical meningioma, and multiple small microhemorrhages consistent with cerebral amyloid angiopathy considered mild in severity. He was evaluated by neurosurgery due to these findings and recommended to have no further intervention or work-up for the meningioma/dural based mass.  SAH was also considered stable.   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  Mass partially circumferential and measuring at least 3 cm in length.  Oozing was noted to be present and evidence of 2 areas of clot.  This was felt to be the culprit for his rectal bleeding and will have ongoing risk for further bleeding until definitively treated.  Therefore in setting of ongoing anemia requiring blood transfusions, anticoagulation will continue to be held Pelvic MRI shows 4.1 cm polypoid right anterior lower rectal mass extending to anorectal junction surgery , Oncology is on board. Son is hopeful to have the surgery done prior to discharge and inpatient given his complexity with MG and clearance needed, esp with anticoag now on hold;  patient has already been cleared as necessary by neurology (see last progress note from 10/12 and 10/13 from Dr. Curly Shores).  Cardiology clearance requested and recommendations given.  Discussed with the patient's son at bedside.  Surgery team discussed the plan with the  patient's son regarding having the procedure done at a tertiary center. Referrals will be made by the surgeon.    Hematuria:  Resolved.  BPH with LUTS.  RCC s/p left nephrectomy on 01/2022   On tamsulosin and finasteride.  Urology consulted and recommendations given.    Acute hypoxic and hypercarbic respiratory failure: Multifactorial- hypercarbia from MG as well as fluid overload. Patient has been using BIPAP since 04/15/2022.  Patient hasn't used CPAP or BIPAP in the last 3 days, appears comfortable, weaned off oxygen and is on RA.  Currently not in distress.    Acute on chronic diastolic CHF New echo during hospitalization; EF 55%, no RWMA; severe concentric LVH.  Indeterminate diastolic parameters.  Mild enlarged right ventricle Restarted him on low dose lasix 20 mg daily.  He has diuresed about 13 lit since admission.    Fever/  enterococcus bacteremia  CXR unchanged from previous.   UA is negative.  Blood cultures  2/2 bottles growing enterococcus, probably GI source.  Marland Kitchen He was started on ampicillin , ID on  board. Plan to repeat blood cultures in am and check TTE. TTE does not show any vegetations. Repeat Blood cultures are negative.  No chills or fever today. Continue to monitor.  Transition to oral antibiotics on discharge.       Stage 3b CKD:  Creatinine is better than baseline.  Baseline between 1.3 to 1.6.  Continue to monitor.        SAH / Meningioma:  Appreciate neurosurgery input.  SAH stable and there are multiple small "microhemorrhages". This has been discussed with neurology (Dr. Curly Shores). The microhemorrhages are felt moreso related to long term anticoagulation since 2007 and less likely from [place] (as originally proposed from [place]). He is felt to NOT have contraindication to anticoagulation in this context      PAF Rate controlled, on anti coagulation which was held for bleeding.  Son would like the anti coagulation to be held for now , in fear to recurrent  bleeding from the rectal ca.      Acute anemia of blood loss from rectal bleeding / hematuria in the setting of  anemia of chronic disease.  Transfuse to keep hemoglobin greater than 7.  Hemoglobin today is around 7.4. s/p 1 unit prbc transfusion and hemoglobin improved to 8.5.  Continue to monitor.        Hypertension:  BP parameters are optimal.      NSVT;    Ocular Myasthenia Gravis:  - on prednisone 10 mg and mestinon.  - continue with NIF'S as per neurology.      Acute metabolic encephalopathy:  due to hypercarbia and deconditioning; some component of hospital delirium and cannot fully rule out hypercalcemia playing a factor Appears to have improved. Alert and answering questions appropriately.      Deconditioning , Generalized weakness:  - therapy eval recommending SNF.  - pt unable t stand and is weaning towards right.   CT head unchanged.            RN Pressure Injury Documentation:     Pressure Injury Coccyx Mid Deep Tissue Pressure Injury - Purple or maroon localized area of discolored intact skin or blood-filled blister due to damage of underlying  soft tissue from pressure and/or shear. (Active)     Location: Coccyx  Location Orientation: Mid  Staging: Deep Tissue Pressure Injury - Purple or maroon localized area of discolored intact skin or blood-filled blister due to damage of underlying soft tissue from pressure and/or shear.  Wound Description (Comments):   Present on Admission: Yes  Dressing Type Foam - Lift dressing to assess site every shift 04/29/22 0740     Pressure Injury 04/15/22 Heel Right Stage 1 -  Intact skin with non-blanchable redness of a localized area usually over a bony prominence. (Active)  04/15/22 1105  Location: Heel  Location Orientation: Right  Staging: Stage 1 -  Intact skin with non-blanchable redness of a localized area usually over a bony prominence.  Wound Description (Comments):   Present on Admission: No  Dressing Type  Foam - Lift dressing to assess site every shift 04/29/22 0740    WOUND CARE CONSULTED AND RECOMMENDATIONS GIVEN.    Malnutrition Type:   Nutrition Problem: Inadequate oral intake Etiology: other (see comment) (difficulty breathing and AMS)        Nutrition Interventions:   Interventions: Other (Comment) (advance diet per SLP recommendations and provide appropriate ONS)     Consultants: ID.  Cardiology Neurology.  Gen surgery.  GI.  PCCM.  Neuro surgery.   Disposition: Skilled nursing facility Diet recommendation:  Discharge Diet Orders (From admission, onward)     Start     Ordered   04/30/22 0000  Diet - low sodium heart healthy        04/30/22 1058           Regular diet DISCHARGE MEDICATION: Allergies as of 04/30/2022       Reactions   Aminoglycosides    Hx of MG    Botox [onabotulinumtoxina]    Hx of MG    Corticosteroids    Caution with high doses as Hx of MG    Hydroxychloroquine    Hx of MG    Levaquin [levofloxacin]    Hx of MG  - avoid all Fluoroquinolones   Macrolides And Ketolides    Hx of MG    Magnesium-containing Compounds    Use with caution as pt with Hx of MG    Neuromuscular Blocking Agents    Hx of MG    Penicillamine    Hx of MG    Procainamide    Hx of MG    Quinidine    Hx of MG         Medication List     STOP taking these medications    amLODipine 5 MG tablet Commonly known as: NORVASC   warfarin 5 MG tablet Commonly known as: COUMADIN       TAKE these medications    carboxymethylcellulose 0.5 % Soln Commonly known as: REFRESH PLUS 1 drop daily as needed (dry eyes).   docusate sodium 100 MG capsule Commonly known as: COLACE Take 1 capsule (100 mg total) by mouth 2 (two) times daily. What changed: when to take this   feeding supplement Liqd Take 237 mLs by mouth daily. Start taking on: May 01, 2022   ferrous sulfate 325 (65 FE) MG tablet Take 325 mg by mouth daily with breakfast.    finasteride 5 MG tablet Commonly known as: PROSCAR Take 1 tablet (5 mg total) by mouth daily. Start taking on: May 01, 2022   furosemide 20 MG tablet Commonly known as: LASIX Take 20 mg by mouth daily.  glucosamine-chondroitin 500-400 MG tablet Take 1 tablet by mouth daily.   HYDROcodone-acetaminophen 5-325 MG tablet Commonly known as: NORCO/VICODIN Take 1-2 tablets by mouth every 6 (six) hours as needed for moderate pain or severe pain.   linezolid 600 MG tablet Commonly known as: ZYVOX Take 1 tablet (600 mg total) by mouth 2 (two) times daily for 10 days.   pantoprazole 40 MG tablet Commonly known as: PROTONIX Take 1 tablet (40 mg total) by mouth 2 (two) times daily. What changed: when to take this   predniSONE 5 MG tablet Commonly known as: DELTASONE Take 2 tablets (10 mg total) by mouth daily. What changed: additional instructions   pyridostigmine 60 MG tablet Commonly known as: Mestinon Take 1 tablet (60 mg total) by mouth every 8 (eight) hours.   tamsulosin 0.4 MG Caps capsule Commonly known as: FLOMAX Take 0.4 mg by mouth daily.   VITAMIN D3 PO Take 1 tablet by mouth daily.               Discharge Care Instructions  (From admission, onward)           Start     Ordered   04/30/22 0000  Discharge wound care:       Comments: Wound care to DTPI in the coccygeal areas:  Cleanse with NS, pat dry. Cover with size appropriate piece of xeroform gauze, top with dry gauze 2x2 and cover with silicone foam oriented so that the "tip" points away from the anus. Change daily and PRN soiling.  04/27/22 1456   04/30/22 1058            Contact information for follow-up providers     [name], MD. Schedule an appointment as soon as possible for a visit in 1 week(s).   Specialty: Family Medicine Contact information: [place] [address] [phone]              Contact information for after-discharge care      Destination     [facility] .   Service: Skilled Nursing Contact information: [address] [place] [phone]                    Discharge Exam: Filed Weights   04/26/22 0440 04/28/22 0500 04/29/22 0500  Weight: 84.7 kg 85 kg 86 kg   General exam: Appears calm and comfortable  Respiratory system: Clear to auscultation. Respiratory effort normal. Cardiovascular system: S1 & S2 heard, IRREGULARLY IRREGULAR.Marland Kitchen No JVD, No pedal edema. Gastrointestinal system: Abdomen is nondistended, soft, non tender.  Normal bowel sounds heard. Central nervous system: Alert and oriented. No focal neurological deficits. Extremities: Symmetric 5 x 5 power. Skin: No rashes, lesions or ulcers Psychiatry:  Mood & affect appropriate.    Condition at discharge: fair  The results of significant diagnostics from this hospitalization (including imaging, microbiology, ancillary and laboratory) are listed below for reference.   Imaging Studies: VAS [place] LOWER EXTREMITY VENOUS (DVT)  Result Date: 04/29/2022  Lower Venous DVT Study Patient Name:  CHUCKIE MCCATHERN  Date of Exam:   04/29/2022 Medical Rec #: [id number]    Accession #:    [id number] Date of Birth: 05-22-1933    Patient Gender: M Patient Age:   26 years Exam Location:  [facility] Procedure:      VAS [place] LOWER EXTREMITY VENOUS (DVT) Referring Phys: Hosie Poisson --------------------------------------------------------------------------------  Indications: Edema.  Risk Factors: Cancer Rectal adenocarcinoma Atrial fibrillation.  Comparison Study: No prior study Performing Technologist: Sharion Dove RVS  Examination Guidelines: A complete evaluation includes B-mode imaging, spectral Doppler, color Doppler, and power Doppler as needed of all accessible portions of each vessel. Bilateral testing is considered an integral part of a complete examination. Limited examinations for reoccurring indications may  be performed as noted. The reflux portion of the exam is performed with the patient in reverse Trendelenburg.  +-----+---------------+---------+-----------+-------------------+--------------+ RIGHTCompressibilityPhasicitySpontaneityProperties         Thrombus Aging +-----+---------------+---------+-----------+-------------------+--------------+ CFV  Full                               pulsatile waveforms               +-----+---------------+---------+-----------+-------------------+--------------+ SFJ  Full                                                                 +-----+---------------+---------+-----------+-------------------+--------------+   +--------+---------------+---------+-----------+----------------+-------------+ LEFT    CompressibilityPhasicitySpontaneityProperties      Thrombus                                                                 Aging         +--------+---------------+---------+-----------+----------------+-------------+ CFV     Full                               pulsatile                                                                waveforms                     +--------+---------------+---------+-----------+----------------+-------------+ SFJ     Full                                                             +--------+---------------+---------+-----------+----------------+-------------+ FV Prox Full                                                             +--------+---------------+---------+-----------+----------------+-------------+ FV Mid  Full                                                             +--------+---------------+---------+-----------+----------------+-------------+  FV      Full                               pulsatile                     Distal                                     waveforms                      +--------+---------------+---------+-----------+----------------+-------------+ PFV     Full                                                             +--------+---------------+---------+-----------+----------------+-------------+ POP     Full                               pulsatile                                                                waveforms                     +--------+---------------+---------+-----------+----------------+-------------+ PTV     Full                                                             +--------+---------------+---------+-----------+----------------+-------------+ PERO    Full                                                             +--------+---------------+---------+-----------+----------------+-------------+    Summary: RIGHT: - No evidence of common femoral vein obstruction. pulsatile waveforms  LEFT: - There is no evidence of deep vein thrombosis in the lower extremity.  - No cystic structure found in the popliteal fossa. Pulsatile waveforms.  *See table(s) above for measurements and observations.    Preliminary    ECHOCARDIOGRAM LIMITED  Result Date: 04/25/2022    ECHOCARDIOGRAM LIMITED REPORT   Patient Name:   KEVION FATHEREE Date of Exam: 04/25/2022 Medical Rec #:  [id number]   Height:       72.5 in Accession #:    [id number]  Weight:       190.4 lb Date of Birth:  08-01-1932   BSA:          2.097 m Patient Age:    89 years    BP:           132/64 mmHg Patient Gender: M  HR:           90 bpm. Exam Location:  Inpatient Procedure: Limited Echo Indications:    Bacteremia R78.81  History:        Patient has prior history of Echocardiogram examinations, most                 recent 04/16/2022. Arrythmias:Atrial Fibrillation,                 Signs/Symptoms:Dyspnea; Risk Factors:Hypertension.  Sonographer:    Bernadene Person RDCS Referring Phys: [place]  1. Limited study without doppler; s/p MV repair and  MAC/subvalvular calcification noted but no obvious vegetations.  2. Left ventricular ejection fraction, by estimation, is 65 to 70%. The left ventricle has normal function. The left ventricle has no regional wall motion abnormalities.  3. Right ventricular systolic function is normal. The right ventricular size is normal.  4. Left atrial size was moderately dilated.  5. Right atrial size was mildly dilated.  6. The mitral valve has been repaired/replaced. Procedure Date: 2005.  7. The aortic valve is tricuspid. FINDINGS  Left Ventricle: Left ventricular ejection fraction, by estimation, is 65 to 70%. The left ventricle has normal function. The left ventricle has no regional wall motion abnormalities. The left ventricular internal cavity size was normal in size. Right Ventricle: The right ventricular size is normal. Right ventricular systolic function is normal. Left Atrium: Left atrial size was moderately dilated. Right Atrium: Right atrial size was mildly dilated. Pericardium: Trivial pericardial effusion is present. Mitral Valve: The mitral valve has been repaired/replaced. Mild mitral annular calcification. Tricuspid Valve: The tricuspid valve is normal in structure. Aortic Valve: The aortic valve is tricuspid. Pulmonic Valve: The pulmonic valve was normal in structure. Aorta: The aortic root is normal in size and structure. Additional Comments: Limited study without doppler; s/p MV repair and MAC/subvalvular calcification noted but no obvious vegetations. Kirk Ruths MD Electronically signed by Kirk Ruths MD Signature Date/Time: 04/25/2022/4:21:14 PM    Final    DG CHEST PORT 1 VIEW  Result Date: 04/24/2022 CLINICAL DATA:  Fever EXAM: PORTABLE CHEST 1 VIEW COMPARISON:  04/19/2022 FINDINGS: Asymmetric airspace disease right greater than left unchanged. Small right effusion has progressed. No left effusion. Prior median sternotomy and valve replacement IMPRESSION: Asymmetric airspace disease right  greater than left unchanged. Progression of small right effusion. Findings may represent pneumonia or asymmetric pulmonary edema. Electronically Signed   By: Franchot Gallo M.D.   On: 04/24/2022 16:19   CT HEAD WO CONTRAST (5MM)  Result Date: 04/24/2022 CLINICAL DATA:  Leaning toward the right and unable to stand. Clinical concern for an acute stroke. EXAM: CT HEAD WITHOUT CONTRAST TECHNIQUE: Contiguous axial images were obtained from the base of the skull through the vertex without intravenous contrast. RADIATION DOSE REDUCTION: This exam was performed according to the departmental dose-optimization program which includes automated exposure control, adjustment of the mA and/or kV according to patient size and/or use of iterative reconstruction technique. COMPARISON:  04/16/2022 FINDINGS: Brain: No significant change in mild-to-moderate enlargement of the ventricles and subarachnoid spaces. Mild-to-moderate patchy white matter low density in both cerebral hemispheres without significant change. The previously demonstrated trace subarachnoid hemorrhage in the right frontal lobe is no longer visualized. No intracranial hemorrhage, mass lesion or CT evidence of acute infarction. Vascular: No hyperdense vessel or unexpected calcification. Skull: Normal. Negative for fracture or focal lesion. Sinuses/Orbits: Status post bilateral cataract extraction. Unremarkable bones and included paranasal sinuses. Other: Deviation  of the anterior nasal septum to the left. IMPRESSION: 1. No acute abnormality. 2. Stable mild-to-moderate diffuse cerebral and cerebellar atrophy and mild-to-moderate chronic small vessel white matter ischemic changes in both cerebral hemispheres. Electronically Signed   By: Claudie Revering M.D.   On: 04/24/2022 14:12   MR PELVIS WO CM RECTAL CA STAGING  Result Date: 04/21/2022 CLINICAL DATA:  Rectal cancer staging EXAM: MRI PELVIS WITHOUT CONTRAST TECHNIQUE: Multiplanar multisequence MR imaging of  the pelvis was performed. No intravenous contrast was administered. Ultrasound gel was administered per rectum to optimize tumor evaluation. COMPARISON:  CT abdomen/pelvis dated 04/11/2022 FINDINGS: TUMOR LOCATION Tumor distance from Anal Verge/Skin Surface: 4.0 cm Tumor distance to Internal Anal Sphincter: 0 cm TUMOR DESCRIPTION Circumferential Extent: Right anterior, extending from 11:00 to 2:00 (series 8/image 23) Tumor Length: 4.1 cm T - CATEGORY Extension through Muscularis Propria: No=T2 Shortest Distance of any tumor/node from Mesorectal Fascia: 4 mm Extramural Vascular Invasion/Tumor Thrombus: No Invasion of Anterior Peritoneal Reflection: No Involvement of Adjacent Organs or Pelvic Sidewall: No Levator Ani Involvement: No N - CATEGORY Mesorectal Lymph Nodes >=21m: None=N0 Extra-mesorectal Lymphadenopathy: No Other:  None. IMPRESSION: 4.1 cm polypoid right anterior low rectal mass extending to the anorectal junction, as described above. Assuming pathology returns a diagnosis of rectal adenocarcinoma, MR staging is provided below. Rectal adenocarcinoma T stage: T2 Rectal adenocarcinoma N stage:  N0 Distance from tumor to the internal anal sphincter is 0 cm. Electronically Signed   By: SJulian HyM.D.   On: 04/21/2022 17:15   DG CHEST PORT 1 VIEW  Result Date: 04/19/2022 CLINICAL DATA:  Acute hypoxic respiratory failure. EXAM: PORTABLE CHEST 1 VIEW COMPARISON:  One-view chest x-ray 04/17/2022. CT of the chest 04/16/2022. FINDINGS: The heart is enlarged. Aeration of both lungs continues to improve. Interstitial and airspace opacities remain worse right than left. Effusions are significantly improved. IMPRESSION: 1. Improving aeration and effusions. 2. Persistent interstitial and airspace opacities remain worse right than left. Electronically Signed   By: CSan MorelleM.D.   On: 04/19/2022 06:59   DG CHEST PORT 1 VIEW  Result Date: 04/17/2022 CLINICAL DATA:  Shortness of breath EXAM:  PORTABLE CHEST 1 VIEW COMPARISON:  Chest CT April 16, 2022 and chest radiograph April 16, 2022. FINDINGS: The heart size and mediastinal contours are enlarged, unchanged. Prior median sternotomy. Aortic atherosclerosis. Mitral valve replacement. Similar veiling bilateral opacities reflecting layering pleural effusions and adjacent atelectasis/consolidation. Bilateral interstitial opacities are also unchanged which may reflect infection or edema. No acute osseous abnormality IMPRESSION: Similar bilateral pleural effusions with adjacent infiltrate or atelectasis. Stable cardiomegaly with bilateral interstitial opacities favor pulmonary edema. Electronically Signed   By: JDahlia BailiffM.D.   On: 04/17/2022 08:30   ECHOCARDIOGRAM COMPLETE  Result Date: 04/16/2022    ECHOCARDIOGRAM REPORT   Patient Name:   NDARRALL STREYDate of Exam: 04/16/2022 Medical Rec #:  [id number]  Height:       72.5 in Accession #:    [id number] Weight:       189.4 lb Date of Birth:  [birth year]  BSA:          2.092 m Patient Age:    89years    BP:           134/63 mmHg Patient Gender: M           HR:           77 bpm. Exam Location:  Inpatient Procedure: 2D Echo Indications:  Cardiomyopathy  History:        Patient has prior history of Echocardiogram examinations, most                 recent 02/14/2022. Cardiomyopathy, MV repair, Arrythmias:Atrial                 Fibrillation, Signs/Symptoms:Edema; Risk Factors:Hypertension.  Sonographer:    Harvie Junior Referring Phys: [place]  1. Left ventricular ejection fraction, by estimation, is 55%. The left ventricle has low normal function. The left ventricle has no regional wall motion abnormalities. There is severe concentric left ventricular hypertrophy. Left ventricular diastolic parameters are indeterminate.  2. Right ventricular systolic function is normal. The right ventricular size is mildly enlarged. There is normal pulmonary artery systolic pressure.  3.  Left atrial size was moderately dilated.  4. Large pleural effusion in the left lateral region.  5. The mitral valve has been repaired/replaced. Mild to moderate mitral valve regurgitation and eccentric, best seen in subcostal views  6. The aortic valve is tricuspid. Aortic valve regurgitation is mild. Comparison(s): Similar MR; has had intermittent atrial fibrillation through this study. FINDINGS  Left Ventricle: Left ventricular ejection fraction, by estimation, is 55%. The left ventricle has low normal function. The left ventricle has no regional wall motion abnormalities. The left ventricular internal cavity size was normal in size. There is severe concentric left ventricular hypertrophy. Left ventricular diastolic parameters are indeterminate. Right Ventricle: The right ventricular size is mildly enlarged. No increase in right ventricular wall thickness. Right ventricular systolic function is normal. There is normal pulmonary artery systolic pressure. The tricuspid regurgitant velocity is 2.74  m/s, and with an assumed right atrial pressure of 3 mmHg, the estimated right ventricular systolic pressure is 90.3 mmHg. Left Atrium: Left atrial size was moderately dilated. Right Atrium: Right atrial size was normal in size. Pericardium: Trivial pericardial effusion is present. Mitral Valve: The mitral valve has been repaired/replaced. Mild to moderate mitral valve regurgitation. Tricuspid Valve: The tricuspid valve is normal in structure. Tricuspid valve regurgitation is mild . No evidence of tricuspid stenosis. Aortic Valve: The aortic valve is tricuspid. Aortic valve regurgitation is mild. Aortic regurgitation PHT measures 680 msec. Aortic valve mean gradient measures 4.0 mmHg. Aortic valve peak gradient measures 7.5 mmHg. Aortic valve area, by VTI measures 3.38 cm. Pulmonic Valve: The pulmonic valve was normal in structure. Pulmonic valve regurgitation is not visualized. No evidence of pulmonic stenosis. Aorta:  The aortic root and ascending aorta are structurally normal, with no evidence of dilitation. IAS/Shunts: No atrial level shunt detected by color flow Doppler. Additional Comments: There is a large pleural effusion in the left lateral region.  LEFT VENTRICLE PLAX 2D LVIDd:         4.80 cm      Diastology LVIDs:         3.50 cm      LV e' medial:    5.98 cm/s LV PW:         1.40 cm      LV E/e' medial:  21.1 LV IVS:        1.60 cm      LV e' lateral:   11.50 cm/s LVOT diam:     2.30 cm      LV E/e' lateral: 11.0 LV SV:         61 LV SV Index:   29 LVOT Area:     4.15 cm  LV Volumes (MOD) LV vol d,  MOD A2C: 146.0 ml LV vol d, MOD A4C: 115.0 ml LV vol s, MOD A2C: 53.5 ml LV vol s, MOD A4C: 47.2 ml LV SV MOD A2C:     92.5 ml LV SV MOD A4C:     115.0 ml LV SV MOD BP:      79.5 ml RIGHT VENTRICLE RV Basal diam:  4.20 cm RV Mid diam:    3.70 cm RV S prime:     23.20 cm/s TAPSE (M-mode): 1.4 cm LEFT ATRIUM              Index        RIGHT ATRIUM           Index LA diam:        4.20 cm  2.01 cm/m   RA Area:     20.60 cm LA Vol (A2C):   111.0 ml 53.05 ml/m  RA Volume:   63.00 ml  30.11 ml/m LA Vol (A4C):   78.3 ml  37.42 ml/m LA Biplane Vol: 95.7 ml  45.74 ml/m  AORTIC VALVE                    PULMONIC VALVE AV Area (Vmax):    3.43 cm     PV Vmax:       1.36 m/s AV Area (Vmean):   3.14 cm     PV Peak grad:  7.4 mmHg AV Area (VTI):     3.38 cm AV Vmax:           137.00 cm/s AV Vmean:          98.600 cm/s AV VTI:            0.182 m AV Peak Grad:      7.5 mmHg AV Mean Grad:      4.0 mmHg LVOT Vmax:         113.00 cm/s LVOT Vmean:        74.600 cm/s LVOT VTI:          0.148 m LVOT/AV VTI ratio: 0.81 AI PHT:            680 msec  AORTA Ao Root diam: 3.70 cm Ao Asc diam:  3.50 cm MITRAL VALVE                TRICUSPID VALVE MV Area (PHT): 4.06 cm     TR Peak grad:   30.0 mmHg MV Decel Time: 187 msec     TR Vmax:        274.00 cm/s MR Peak grad: 99.9 mmHg MR Vmax:      499.67 cm/s   SHUNTS MV E velocity: 126.00 cm/s  Systemic  VTI:  0.15 m MV A velocity: 94.00 cm/s   Systemic Diam: 2.30 cm MV E/A ratio:  1.34 Rudean Haskell MD Electronically signed by Rudean Haskell MD Signature Date/Time: 04/16/2022/3:37:21 PM    Final    CT HEAD WO CONTRAST (5MM)  Result Date: 04/16/2022 CLINICAL DATA:  Provided history: Subarachnoid hemorrhage. EXAM: CT HEAD WITHOUT CONTRAST TECHNIQUE: Contiguous axial images were obtained from the base of the skull through the vertex without intravenous contrast. RADIATION DOSE REDUCTION: This exam was performed according to the departmental dose-optimization program which includes automated exposure control, adjustment of the mA and/or kV according to patient size and/or use of iterative reconstruction technique. COMPARISON:  Brain MRI 04/16/2022. Non-contrast head CT 04/15/2022. CT angiogram head/neck 04/15/2022. FINDINGS: Brain: Mild-to-moderate generalized cerebral atrophy. Mild cerebellar atrophy. Trace  acute subarachnoid hemorrhage overlying the posterior right frontal lobe, unchanged (series 2, image 27) (series 5, image 41) (series 6, image 22). 13 x 4 mm dural-based mass overlying the mid right frontal lobe, likely reflecting a meningioma. No significant mass effect upon the underlying brain parenchyma. Chronic lacunar infarcts in bilateral cerebral hemispheric white matter, better appreciated on the same-day brain MRI. Background mild chronic small ischemic changes within the cerebral white matter and pons. No demarcated cortical infarct No midline shift or hydrocephalus. Vascular: No hyperdense vessel. Atherosclerotic calcifications. Skull: No fracture or aggressive osseous lesion. Sinuses/Orbits: No mass or acute finding within the imaged orbits. No significant paranasal sinus disease at the imaged levels. IMPRESSION: Trace acute subarachnoid hemorrhage overlying the posterior right frontal lobe, unchanged from the brain MRI performed earlier today. Redemonstrated 13 x 4 mm dural-based  mass overlying the mid right frontal lobe, likely reflecting a meningioma. No significant mass effect upon the underlying brain parenchyma. Background cerebral atrophy and chronic small vessel disease, stable. Electronically Signed   By: Kellie Simmering D.O.   On: 04/16/2022 12:16   CT CHEST WO CONTRAST  Result Date: 04/16/2022 CLINICAL DATA:  Sepsis EXAM: CT CHEST WITHOUT CONTRAST TECHNIQUE: Multidetector CT imaging of the chest was performed following the standard protocol without IV contrast. RADIATION DOSE REDUCTION: This exam was performed according to the departmental dose-optimization program which includes automated exposure control, adjustment of the mA and/or kV according to patient size and/or use of iterative reconstruction technique. COMPARISON:  CT abdomen pelvis, 04/11/2022 FINDINGS: Cardiovascular: Aortic atherosclerosis. Cardiomegaly. Three-vessel coronary artery calcifications. Enlargement of the main pulmonary artery measuring up to 3.8 cm. No pericardial effusion. Mediastinum/Nodes: Prominent mediastinal lymph nodes. Thyroid gland, trachea, and esophagus demonstrate no significant findings. Lungs/Pleura: Moderate bilateral pleural effusions and associated atelectasis or consolidation increased when compared to the lung bases on examination dated 04/11/2022. Diffuse bilateral bronchial wall thickening. Ground-glass airspace opacity and interseptal lobular thickening. Upper Abdomen: No acute abnormality. Musculoskeletal: No chest wall abnormality. No acute osseous findings. IMPRESSION: 1. Moderate bilateral pleural effusions and associated atelectasis or consolidation, increased when compared to the lung bases on examination dated 04/11/2022. 2. Diffuse bilateral bronchial wall thickening, ground-glass airspace opacity and interseptal lobular thickening, most consistent with pulmonary edema. 3. Cardiomegaly and coronary artery disease. 4. Enlargement of the main pulmonary artery, as can be seen  in pulmonary hypertension. Aortic Atherosclerosis (ICD10-I70.0). Electronically Signed   By: Delanna Ahmadi M.D.   On: 04/16/2022 12:09   MR BRAIN WO CONTRAST  Result Date: 04/16/2022 CLINICAL DATA:  Provided history: Subarachnoid hemorrhage. EXAM: MRI HEAD WITHOUT CONTRAST TECHNIQUE: Multiplanar, multiecho pulse sequences of the brain and surrounding structures were obtained without intravenous contrast. COMPARISON:  CT angiogram head/neck 04/15/2022. Head CT 04/15/2022. FINDINGS: Mild intermittent motion degradation. Brain: Mild-to-moderate generalized cerebral atrophy. Mild cerebellar atrophy. Small curvilinear focus of T2 FLAIR hyperintense signal abnormality and susceptibility-weighted signal loss along the posterior right frontal lobe, compatible with small-volume acute subarachnoid hemorrhage (for instance as seen on series 9, image 48) (series 10, image 57). 13 x 4 mm dural-based mass overlying the mid right frontal lobe, likely reflecting a meningioma (series 9, image 40). No significant mass effect upon the underlying brain parenchyma. Chronic lacunar infarcts within bilateral cerebral hemispheric white matter. Background mild for age multifocal T2 FLAIR hyperintense signal abnormality within the cerebral white matter and pons, nonspecific but compatible with chronic small vessel ischemic disease. 3-4 mm T1 and T2 FLAIR hyperintense lesion within the anterior right frontal lobe white  matter with associated susceptibility-weighted signal loss (series 9, image 27) (series 10, image 33). This may reflect a cavernoma or chronic microhemorrhage. Several chronic microhemorrhages scattered elsewhere within the supratentorial and infratentorial brain. There is no acute infarct. No evidence of an intracranial mass. No midline shift or evidence of hydrocephalus. Vascular: Maintained flow voids within the proximal large arterial vessels. Skull and upper cervical spine: No focal suspicious marrow lesion.  Incompletely assessed cervical spondylosis. Sinuses/Orbits: No mass or acute finding within the imaged orbits. Prior bilateral ocular lens replacement. Small mucous retention cyst within the left maxillary sinus. IMPRESSION: 1. Small-volume acute subarachnoid hemorrhage overlying the posterior right frontal lobe, unchanged from the prior head CT of 04/15/2022. 2. 13 x 4 mm dural-based mass overlying the right frontal lobe, likely reflecting a meningioma. No significant mass effect upon the underlying brain parenchyma. 3. Chronic lacunar infarcts in the bilateral cerebral hemispheric white matter. 4. Background mild cerebral white matter and pontine chronic small vessel ischemic disease. 5. 3-4 mm lesion within the anterior right frontal lobe, which may reflect a cavernoma or chronic microhemorrhage. 6. Several chronic microhemorrhages scattered elsewhere within the supratentorial and infratentorial brain. 7. Mild-to-moderate generalized cerebral atrophy. 8. Mild cerebellar atrophy. Electronically Signed   By: Kellie Simmering D.O.   On: 04/16/2022 11:45   DG CHEST PORT 1 VIEW  Result Date: 04/16/2022 CLINICAL DATA:  Shortness of breath. EXAM: PORTABLE CHEST 1 VIEW COMPARISON:  Chest x-ray April 15, 2022. FINDINGS: Slightly more dense left basilar consolidation with similar additional patchy airspace opacities throughout the lungs. Possible trace bilateral pleural effusions. No visible pneumothorax. Similar cardiomediastinal silhouette. Median sternotomy. Mitral valve replacement. IMPRESSION: Slightly more dense left basilar consolidation with similar additional patchy airspace opacities throughout the lungs. Electronically Signed   By: Margaretha Sheffield M.D.   On: 04/16/2022 08:15   CT ANGIO HEAD NECK W WO CM (CODE STROKE)  Result Date: 04/15/2022 CLINICAL DATA:  Acute neurologic deficit EXAM: CT ANGIOGRAPHY HEAD AND NECK TECHNIQUE: Multidetector CT imaging of the head and neck was performed using the standard  protocol during bolus administration of intravenous contrast. Multiplanar CT image reconstructions and MIPs were obtained to evaluate the vascular anatomy. Carotid stenosis measurements (when applicable) are obtained utilizing NASCET criteria, using the distal internal carotid diameter as the denominator. RADIATION DOSE REDUCTION: This exam was performed according to the departmental dose-optimization program which includes automated exposure control, adjustment of the mA and/or kV according to patient size and/or use of iterative reconstruction technique. CONTRAST:  129m OMNIPAQUE IOHEXOL 350 MG/ML SOLN COMPARISON:  None Available. FINDINGS: CTA NECK FINDINGS SKELETON: There is no bony spinal canal stenosis. No lytic or blastic lesion. OTHER NECK: Normal pharynx, larynx and major salivary glands. No cervical lymphadenopathy. Unremarkable thyroid gland. UPPER CHEST: Bilateral medium-sized pleural effusions and multiple subcentimeter mediastinal lymph nodes. Moderate pulmonary edema. AORTIC ARCH: There is calcific atherosclerosis of the aortic arch. There is no aneurysm, dissection or hemodynamically significant stenosis of the visualized portion of the aorta. Conventional 3 vessel aortic branching pattern. The visualized proximal subclavian arteries are widely patent. RIGHT CAROTID SYSTEM: Normal without aneurysm, dissection or stenosis. LEFT CAROTID SYSTEM: Normal without aneurysm, dissection or stenosis. VERTEBRAL ARTERIES: Left dominant configuration. Both origins are clearly patent. There is no dissection, occlusion or flow-limiting stenosis to the skull base (V1-V3 segments). CTA HEAD FINDINGS POSTERIOR CIRCULATION: --Vertebral arteries: Left vertebral artery terminates in PICA. Otherwise normal. --Inferior cerebellar arteries: Normal. --Basilar artery: Normal. --Superior cerebellar arteries: Normal. --Posterior cerebral arteries (PCA): Normal.  ANTERIOR CIRCULATION: --Intracranial internal carotid arteries:  Normal. --Anterior cerebral arteries (ACA): Normal. Both A1 segments are present. Patent anterior communicating artery (a-comm). --Middle cerebral arteries (MCA): Normal. VENOUS SINUSES: As permitted by contrast timing, patent. ANATOMIC VARIANTS: None Review of the MIP images confirms the above findings. IMPRESSION: 1. No emergent large vessel occlusion or high-grade stenosis of the intracranial arteries. 2. Bilateral medium-sized pleural effusions and moderate pulmonary edema. 3. Upper mediastinal lymphadenopathy. Electronically Signed   By: Ulyses Jarred M.D.   On: 04/15/2022 20:38   DG CHEST PORT 1 VIEW  Result Date: 04/15/2022 CLINICAL DATA:  [id number] SOB (shortness of breath) [id number] EXAM: PORTABLE CHEST 1 VIEW COMPARISON:  Chest x-ray 02/13/2022 FINDINGS: The heart and mediastinal contours are within normal limits. Mitral valve replacement. Aortic calcification. Interval development of patchy airspace opacities most prominent within the right lower lung zone and retrocardiac airspace opacity. No pulmonary edema. Bilateral trace pleural effusions. No pneumothorax. No acute osseous abnormality.  Sternotomy wires are intact. IMPRESSION: 1. Multifocal pneumonia. Followup PA and lateral chest X-ray is recommended in 3-4 weeks following therapy to ensure resolution and exclude underlying malignancy. 2. Bilateral trace pleural effusions. 3.  Aortic Atherosclerosis (ICD10-I70.0). Electronically Signed   By: Iven Finn M.D.   On: 04/15/2022 19:20   [place] EKG SITE RITE  Result Date: 04/15/2022 If Site Rite image not attached, placement could not be confirmed due to current cardiac rhythm.  CT HEAD WO CONTRAST (5MM)  Result Date: 04/15/2022 CLINICAL DATA:  Mental status change, unknown cause EXAM: CT HEAD WITHOUT CONTRAST TECHNIQUE: Contiguous axial images were obtained from the base of the skull through the vertex without intravenous contrast. RADIATION DOSE REDUCTION: This exam was performed according to the  departmental dose-optimization program which includes automated exposure control, adjustment of the mA and/or kV according to patient size and/or use of iterative reconstruction technique. COMPARISON:  CT head 01/27/2022 BRAIN: BRAIN Cerebral ventricle sizes are concordant with the degree of cerebral volume loss. Patchy and confluent areas of decreased attenuation are noted throughout the deep and periventricular white matter of the cerebral hemispheres bilaterally, compatible with chronic microvascular ischemic disease. No evidence of large-territorial acute infarction. No parenchymal hemorrhage. No mass lesion. Vague right frontal sulcal hyperdensity may represent a trace developing subarachnoid hemorrhage (4:40, 2:28). No mass effect or midline shift. No hydrocephalus. Basilar cisterns are patent. Vascular: No hyperdense vessel. Skull: No acute fracture or focal lesion. Sinuses/Orbits: Paranasal sinuses and mastoid air cells are clear. Bilateral lens replacement. Otherwise the orbits are unremarkable. Other: None. IMPRESSION: Vague right frontal sulcal hyperdensity may represent a trace developing subarachnoid hemorrhage. These results will be called to the ordering clinician or representative by the Radiologist Assistant, and communication documented in the PACS or [employer]. Electronically Signed   By: Iven Finn M.D.   On: 04/15/2022 17:25   CT ABDOMEN PELVIS WO CONTRAST  Result Date: 04/11/2022 CLINICAL DATA:  Abdominal pain, acute, nonlocalized, hematuria. EXAM: CT ABDOMEN AND PELVIS WITHOUT CONTRAST TECHNIQUE: Multidetector CT imaging of the abdomen and pelvis was performed following the standard protocol without IV contrast. RADIATION DOSE REDUCTION: This exam was performed according to the departmental dose-optimization program which includes automated exposure control, adjustment of the mA and/or kV according to patient size and/or use of iterative reconstruction technique. COMPARISON:   None Available. FINDINGS: Lower chest: Small right pleural effusion, new since prior examination. Moderate coronary artery calcification. Mitral valve replacement has been performed. Cardiac size is at the upper limits of normal. Hepatobiliary: No  focal liver abnormality is seen. Status post cholecystectomy. No biliary dilatation. Pancreas: Unremarkable Spleen: Unremarkable Adrenals/Urinary Tract: The adrenal glands are unremarkable. Interval left nephrectomy. Residual right kidney is normal in size and position. No hydronephrosis. No intrarenal or ureteral calculi. The bladder is unremarkable. Stomach/Bowel: Moderate sigmoid and descending colonic diverticulosis without superimposed acute inflammatory change. There is polypoid soft tissue within the rectum, not well assessed on this noncontrast examination but persistent since prior examination suspicious for an underlying rectal mass best seen on axial image # 78/2. No evidence of obstruction or focal inflammation. The stomach, small bowel, and large bowel are otherwise unremarkable. Status post appendectomy. Vascular/Lymphatic: Moderate aortoiliac atherosclerotic calcification. No aortic aneurysm. No pathologic adenopathy within the abdomen and pelvis. Reproductive: Marked prostatic enlargement again noted. Other: No abdominal wall hernia. Musculoskeletal: No acute bone abnormality. No lytic or blastic bone lesion. Osseous structures are age-appropriate. IMPRESSION: 1. No acute intra-abdominal pathology identified. No definite radiographic explanation for the patient's reported symptoms. 2. Interval left nephrectomy. 3. Polypoid soft tissue within the rectum, not well assessed on this noncontrast examination but suspicious for an underlying rectal mass. Correlation with endoscopy is recommended. 4. Moderate descending and sigmoid colonic diverticulosis without superimposed acute inflammatory change. 5. Marked prostatic enlargement. 6. Small right pleural  effusion, new since prior examination. Aortic Atherosclerosis (ICD10-I70.0). Electronically Signed   By: Fidela Salisbury M.D.   On: 04/11/2022 22:01    Microbiology: Results for orders placed or performed during the hospital encounter of 04/11/22  Urine Culture     Status: None   Collection Time: 04/11/22  2:24 PM   Specimen: Urine, Clean Catch  Result Value Ref Range Status   Specimen Description   Final    URINE, CLEAN CATCH Performed at [facility], [place] [address] [place], [place] [address]    Special Requests   Final    NONE Performed at [facility], [place] [address], [place], [place] [address]    Culture   Final    NO GROWTH Performed at [facility], [place] [address], [place], [place] [address]    Report Status 04/12/2022 FINAL  Final  MRSA Next Gen by PCR, Nasal     Status: None   Collection Time: 04/15/22  6:32 PM   Specimen: Nasal Mucosa; Nasal Swab  Result Value Ref Range Status   MRSA by PCR Next Gen NOT DETECTED NOT DETECTED Final    Comment: (NOTE) The GeneXpert MRSA Assay (FDA approved for NASAL specimens only), is one component of a comprehensive MRSA colonization surveillance program. It is not intended to diagnose MRSA infection nor to guide or monitor treatment for MRSA infections. Test performance is not FDA approved in patients less than 86 years old. Performed at [facility], [place] [address], [place], [place] [address]   Respiratory (~20 pathogens) panel by PCR     Status: None   Collection Time: 04/16/22 12:13 PM   Specimen: Nasopharyngeal Swab; Respiratory  Result Value Ref Range Status   Adenovirus NOT DETECTED NOT DETECTED Final   Coronavirus 229E NOT DETECTED NOT DETECTED Final    Comment: (NOTE) The Coronavirus on the Respiratory Panel, DOES NOT test for the novel  Coronavirus (2019 nCoV)    Coronavirus HKU1 NOT DETECTED NOT DETECTED Final   Coronavirus NL63 NOT DETECTED NOT DETECTED  Final   Coronavirus OC43 NOT DETECTED NOT DETECTED Final   Metapneumovirus NOT DETECTED NOT DETECTED Final   Rhinovirus / Enterovirus NOT DETECTED  NOT DETECTED Final   Influenza A NOT DETECTED NOT DETECTED Final   Influenza B NOT DETECTED NOT DETECTED Final   Parainfluenza Virus 1 NOT DETECTED NOT DETECTED Final   Parainfluenza Virus 2 NOT DETECTED NOT DETECTED Final   Parainfluenza Virus 3 NOT DETECTED NOT DETECTED Final   Parainfluenza Virus 4 NOT DETECTED NOT DETECTED Final   Respiratory Syncytial Virus NOT DETECTED NOT DETECTED Final   Bordetella pertussis NOT DETECTED NOT DETECTED Final   Bordetella Parapertussis NOT DETECTED NOT DETECTED Final   Chlamydophila pneumoniae NOT DETECTED NOT DETECTED Final   Mycoplasma pneumoniae NOT DETECTED NOT DETECTED Final    Comment: Performed at [facility], [place] [address], [place], [place] [address]  Culture, blood (Routine X 2) w Reflex to ID Panel     Status: Abnormal   Collection Time: 04/24/22  2:23 PM   Specimen: BLOOD  Result Value Ref Range Status   Specimen Description   Final    BLOOD BLOOD RIGHT ARM Performed at [place] [address] [place], [place] [address]    Special Requests   Final    BOTTLES DRAWN AEROBIC ONLY Blood Culture adequate volume Performed at [place] [address] [place], [place] [address]    Culture  Setup Time   Final    GRAM POSITIVE COCCI IN CLUSTERS IN CHAINS IN PAIRS AEROBIC BOTTLE ONLY CRITICAL RESULT CALLED TO, READ BACK BY AND VERIFIED WITH: PHARMD T GREEN [id number] AT 1356 BY CM Performed at [facility], [place] [address] [place], [place] [address]    Culture ENTEROCOCCUS FAECALIS (A)  Final   Report Status 04/27/2022 FINAL  Final   Organism ID, Bacteria ENTEROCOCCUS FAECALIS  Final      Susceptibility   Enterococcus faecalis - MIC*    AMPICILLIN <=2 SENSITIVE Sensitive     VANCOMYCIN 2 SENSITIVE Sensitive     GENTAMICIN SYNERGY  SENSITIVE Sensitive     * ENTEROCOCCUS FAECALIS  Culture, blood (Routine X 2) w Reflex to ID Panel     Status: Abnormal   Collection Time: 04/24/22  2:23 PM   Specimen: BLOOD  Result Value Ref Range Status   Specimen Description   Final    BLOOD BLOOD RIGHT HAND Performed at [facility], [place] [address], [place], [place] [address]    Special Requests   Final    BOTTLES DRAWN AEROBIC ONLY Blood Culture adequate volume Performed at [place] [address], [place], [place] [address]    Culture  Setup Time   Final    GRAM POSITIVE COCCI IN CLUSTERS IN CHAINS IN PAIRS CRITICAL VALUE NOTED.  VALUE IS CONSISTENT WITH PREVIOUSLY REPORTED AND CALLED VALUE. AEROBIC BOTTLE ONLY    Culture (A)  Final    ENTEROCOCCUS FAECALIS SUSCEPTIBILITIES PERFORMED ON PREVIOUS CULTURE WITHIN THE LAST 5 DAYS. Performed at [facility], [place] [address] [place], [place] [address]    Report Status 04/27/2022 FINAL  Final  Blood Culture ID Panel (Reflexed)     Status: Abnormal   Collection Time: 04/24/22  2:23 PM  Result Value Ref Range Status   Enterococcus faecalis DETECTED (A) NOT DETECTED Final    Comment: CRITICAL RESULT CALLED TO, READ BACK BY AND VERIFIED WITH: PHARMD T GREEN [id number] AT 1353 BY CM    Enterococcus Faecium NOT DETECTED NOT DETECTED Final   Listeria monocytogenes NOT DETECTED NOT DETECTED Final   Staphylococcus species NOT  DETECTED NOT DETECTED Final   Staphylococcus aureus (BCID) NOT DETECTED NOT DETECTED Final   Staphylococcus epidermidis NOT DETECTED NOT DETECTED Final   Staphylococcus lugdunensis NOT DETECTED NOT DETECTED Final   Streptococcus species NOT DETECTED NOT DETECTED Final   Streptococcus agalactiae NOT DETECTED NOT DETECTED Final   Streptococcus pneumoniae NOT DETECTED NOT DETECTED Final   Streptococcus pyogenes NOT DETECTED NOT DETECTED Final   A.calcoaceticus-baumannii NOT DETECTED NOT DETECTED Final   Bacteroides  fragilis NOT DETECTED NOT DETECTED Final   Enterobacterales NOT DETECTED NOT DETECTED Final   Enterobacter cloacae complex NOT DETECTED NOT DETECTED Final   Escherichia coli NOT DETECTED NOT DETECTED Final   Klebsiella aerogenes NOT DETECTED NOT DETECTED Final   Klebsiella oxytoca NOT DETECTED NOT DETECTED Final   Klebsiella pneumoniae NOT DETECTED NOT DETECTED Final   Proteus species NOT DETECTED NOT DETECTED Final   Salmonella species NOT DETECTED NOT DETECTED Final   Serratia marcescens NOT DETECTED NOT DETECTED Final   Haemophilus influenzae NOT DETECTED NOT DETECTED Final   Neisseria meningitidis NOT DETECTED NOT DETECTED Final   Pseudomonas aeruginosa NOT DETECTED NOT DETECTED Final   Stenotrophomonas maltophilia NOT DETECTED NOT DETECTED Final   Candida albicans NOT DETECTED NOT DETECTED Final   Candida auris NOT DETECTED NOT DETECTED Final   Candida glabrata NOT DETECTED NOT DETECTED Final   Candida krusei NOT DETECTED NOT DETECTED Final   Candida parapsilosis NOT DETECTED NOT DETECTED Final   Candida tropicalis NOT DETECTED NOT DETECTED Final   Cryptococcus neoformans/gattii NOT DETECTED NOT DETECTED Final   Vancomycin resistance NOT DETECTED NOT DETECTED Final    Comment: Performed at [facility], [address], [place], [place] [address]  Urine Culture     Status: None   Collection Time: 04/25/22  2:55 AM   Specimen: Urine, Clean Catch  Result Value Ref Range Status   Specimen Description   Final    URINE, CLEAN CATCH Performed at [facility], [place] [address] [place], [place] [address]    Special Requests   Final    Immunocompromised Performed at [facility], [place] [address] [place], [place] [address]    Culture   Final    NO GROWTH Performed at [facility], [place] [address], [place], [place] [address]    Report Status 04/26/2022 FINAL  Final  Culture, blood (Routine X 2) w Reflex to ID Panel     Status: None  (Preliminary result)   Collection Time: 04/26/22  5:01 AM   Specimen: BLOOD LEFT ARM  Result Value Ref Range Status   Specimen Description   Final    BLOOD LEFT ARM Performed at [place] [address], [place], [place] [address]    Special Requests   Final    BOTTLES DRAWN AEROBIC AND ANAEROBIC Blood Culture adequate volume Performed at [place] [address], [place], [place] [address]    Culture   Final    NO GROWTH 4 DAYS Performed at [facility], [place] [address], [place], [place] [address]    Report Status PENDING  Incomplete  Culture, blood (Routine X 2) w Reflex to ID Panel     Status: None (Preliminary result)   Collection Time: 04/26/22  5:07 AM   Specimen: BLOOD RIGHT HAND  Result Value Ref Range Status   Specimen Description   Final    BLOOD RIGHT HAND Performed at [place]  [address], [place], [place] [address]    Special Requests   Final    BOTTLES DRAWN AEROBIC AND ANAEROBIC Blood Culture adequate volume Performed at [place] [address], [place], [place] [address]    Culture   Final    NO GROWTH 4 DAYS Performed at [facility], [place] [address] [place], [place] [address]    Report Status PENDING  Incomplete    Labs: CBC: Recent Labs  Lab 04/24/22 0508 04/25/22 0455 04/26/22 0501 04/27/22 0540 04/27/22 1453 04/28/22 0521 04/29/22 0800  WBC 9.6 10.6* 12.2* 11.7*  --   --  13.2*  NEUTROABS 7.8* 8.3* 9.6* 9.0*  --   --  11.0*  HGB 7.4* 7.4* 7.4* 7.4* 7.7* 8.5* 8.3*  HCT 23.9* 24.1* 23.6* 23.7* 25.0* 26.9* 27.7*  MCV 94.1 93.4 94.0 94.0  --   --  96.9  PLT 163 162 192 188  --   --  111   Basic Metabolic Panel: Recent Labs  Lab 04/25/22 0455 04/26/22 0501 04/27/22 0540 04/28/22 0521 04/29/22 0435  NA 137 136 135 136 136  K 4.0 3.9 3.8 3.9 4.0  CL 102 103 103 103 103  CO2 _0 GLUCOSE 96 103* 93 94 108*  BUN 45* 49*  50* 51* 53*  CREATININE 1.15 1.13 1.21 1.25* 1.34*  CALCIUM 8.8* 8.8* 8.6* 8.7* 9.1  MG 2.0 1.8 1.7 1.8 2.0   Liver Function Tests: No results for input(s): "AST", "ALT", "ALKPHOS", "BILITOT", "PROT", "ALBUMIN" in the last 168 hours. CBG: No results for input(s): "GLUCAP" in the last 168 hours.  Discharge time spent: 52 minutes.   Signed: Hosie Poisson, MD [facility] 04/30/2022

## 2022-04-30 NOTE — Care Management Important Message (Signed)
Important Message  Patient Details IM Letter given Name: Daniel Sharp MRN: [id number] Date of Birth: Dec 07, 1932   Medicare Important Message Given:  Yes     [name] 04/30/2022, 1:48 PM

## 2022-04-30 NOTE — Progress Notes (Signed)
NIF-24 VC 1.1L

## 2022-04-30 NOTE — Progress Notes (Addendum)
Nutrition Follow-up  DOCUMENTATION CODES:   Not applicable  INTERVENTION:  -Continue heart healthy diet as tolerated -Continue Ensure Plus HP q day (350kcal, 20g protein)  NUTRITION DIAGNOSIS:  Inadequate oral intake related to other (see comment) (difficulty breathing and AMS) as evidenced by NPO status.  GOAL:  Patient will meet greater than or equal to 90% of their needs  MONITOR:  Diet advancement  REASON FOR ASSESSMENT:  Malnutrition Screening Tool    ASSESSMENT:  Pt is an 88yo M with PMH of HTN, A fib, chronic blood loss anemia, kidney cancer s/p L nephrectomy, chronic right sided heart failure, BPH, CKD 3, ocular myasthenia gravis, and arthritis who presents with micro hematuria and dark colored stool.  Pt continues on heart healthy diet with adequate acceptance. Po documentation shows 95% intake of lunch tray. Pt is accepting Ensure Plus HP q day (350kcal, 20g protein). Reviewed menus from 10/24, pt is meeting >90% estimated nutrient needs with current diet and supplements. Weight fluctuating throughout admission, likely related to fluid shifts with lasix. Nutrition related labs from 10/23 reviewed and stable. Continue current nutrition interventions.   Diet Order:   Diet Order             Diet Heart Room service appropriate? Yes; Fluid consistency: Thin  Diet effective now                   EDUCATION NEEDS:  Not appropriate for education at this time  Skin:  Skin Assessment: Skin Integrity Issues: Skin Integrity Issues:: DTI, Stage I DTI: coccyx Stage I: right heel  Last BM:  10/22  Height: Ht Readings from Last 1 Encounters:  04/11/22 6' 0.5" (1.842 m)   Weight:  Wt Readings from Last 1 Encounters:  04/29/22 86 kg    BMI:  Body mass index is 25.36 kg/m.  Estimated Nutritional Needs:  Kcal:  [id number]  Protein:  105-130g  Fluid:  193m  KCandise Bowens MS, RD, LDN, CNSC See AMiON for contact information

## 2022-04-30 NOTE — TOC Transition Note (Signed)
Transition of Care [facility]) - CM/SW Discharge Note   Patient Details  Name: Kayden Hutmacher MRN: [id number] Date of Birth: Nov 21, 1932  Transition [facility] CM/SW Contact:  Dessa Phi, RN Phone Number: 04/30/2022, 12:07 PM   Clinical Narrative:   d/c today [facility] rep [facility] [place] accepted,has a bed.Going to [facility] rm#[id number], tel report [phone].PTAR called. No further CM needs.    Final next level of care: Skilled Nursing Facility Barriers to Discharge: No Barriers Identified   Patient Goals and CMS Choice Patient states their goals for this hospitalization and ongoing recovery are:: To get better CMS Medicare.gov Compare Post Acute Care list provided to:: Patient Choice offered to / list presented to : Patient, Adult Children  Discharge Placement PASRR number recieved: 04/23/22            Patient chooses bed at: [facility] Patient to be transferred to facility by:  Corey Harold) Name of family member notified:  (Dirk(Son)) Patient and family notified of of transfer: 04/30/22  Discharge Plan and Services                                     Social Determinants of Health (SDOH) Interventions     Readmission Risk Interventions     No data to display

## 2022-05-01 ENCOUNTER — Telehealth: Payer: Self-pay

## 2022-05-01 LAB — CULTURE, BLOOD (ROUTINE X 2)
Culture: NO GROWTH
Culture: NO GROWTH
Special Requests: ADEQUATE
Special Requests: ADEQUATE

## 2022-05-01 SURGERY — ECHOCARDIOGRAM, TRANSESOPHAGEAL
Anesthesia: Monitor Anesthesia Care

## 2022-05-01 NOTE — Telephone Encounter (Signed)
Transition Care Management Follow-up Telephone Call Date of discharge and from where: [facility] 04/30/2022 How have you been since you were released from the hospital? weak Any questions or concerns? No  Items Reviewed: Did the pt receive and understand the discharge instructions provided? Yes  Medications obtained and verified? Yes  Other? No  Any new allergies since your discharge? No  Dietary orders reviewed? Yes Do you have support at home? Yes   Home Care and Equipment/Supplies: Were home health services ordered? no If so, what is the name of the agency? N/a  Has the agency set up a time to come to the patient's home? no Were any new equipment or medical supplies ordered?  No What is the name of the medical supply agency? N/a Were you able to get the supplies/equipment? not applicable Do you have any questions related to the use of the equipment or supplies? No  Functional Questionnaire: (I = Independent and D = Dependent) ADLs: D  Bathing/Dressing- D  Meal Prep- D  Eating- I  Maintaining continence- I  Transferring/Ambulation- D  Managing Meds- D  Follow up appointments reviewed:  PCP Hospital f/u appt confirmed? Yes  Scheduled to see Dr Cherlynn Kaiser on 05/07/2022 @ 11:00. [facility] f/u appt confirmed? Yes  Scheduled to see Dr Andreas Ohm surgeon on 05/06/2022 @ 9:00. Are transportation arrangements needed? No  If their condition worsens, is the pt aware to call PCP or go to the Emergency Dept.? Yes Was the patient provided with contact information for the PCP's office or ED? Yes Was to pt encouraged to call back with questions or concerns? Yes  Juanda Crumble, LPN [place] Direct Dial [phone]

## 2022-05-06 ENCOUNTER — Inpatient Hospital Stay: Payer: Medicare HMO | Admitting: Family

## 2022-05-06 ENCOUNTER — Inpatient Hospital Stay: Payer: Medicare HMO

## 2022-05-06 NOTE — Telephone Encounter (Signed)
Settings in Hospital: Inspiratory Pressure 18 Expiratory Pressure 8 Full Face mask, extra large  Ok to order. These have not been validated on sleep study so no way to know if adequate or not. Needs sleep study done to verify settings or change settings or to even see if sleep apnea is present or not.

## 2022-05-06 NOTE — Telephone Encounter (Signed)
Spoke with the pt's son  Pt seen in the hospital by Dr Silas Flood He is requesting that we order BIPAP for the pt  He is currently at a rehab and needs order for BIPAP  Dr Silas Flood, please advise if we can order and if so, what settings? Thanks!

## 2022-05-06 NOTE — Telephone Encounter (Signed)
I called and spoke with the pt and notified of response per Dr Silas Flood. He verbalized understanding. He agreed to have the HST done, and then I advised about the wait time for this and he became upset and states will pay out of pocket for BIPAP and wants order for this now. He said he will do the HST later, but wants BIPAP ordered now. Please advise, thanks!

## 2022-05-06 NOTE — Telephone Encounter (Signed)
Per the discharge summary, the patient did not wear CPAP or BiPAP for 3 days prior to discharge.  Ideally, this would have been addressed prior to discharge by the discharging physician or a member of our pulmonary team.  I last evaluated the patient on 10/16 and he was discharged on 10/24.  I discussed in the hospital that in order to obtain CPAP or BiPAP we need to do a sleep study as an outpatient. Even if he has it in the rehab facility, he will not be able to get it when discharged home without a sleep study. I am happy to order a sleep study now to try to get the ball rolling for this to be performed.  From documentation, he is not on oxygen therapy, this was weaned off in the hospital prior to discharge.  Therefore, we can do a home sleep study.  If patient and son are agreeable to this, please order home sleep study, will need to schedule a follow-up appointment with me in the next few weeks.  Or if they prefer, we can offer them a consultation with one of our sleep doctors here in clinic.  Thank you.

## 2022-05-07 ENCOUNTER — Ambulatory Visit (INDEPENDENT_AMBULATORY_CARE_PROVIDER_SITE_OTHER): Payer: Medicare HMO | Admitting: Family Medicine

## 2022-05-07 ENCOUNTER — Encounter: Payer: Self-pay | Admitting: Family Medicine

## 2022-05-07 VITALS — BP 117/66 | HR 100 | Temp 97.9°F | Ht 73.0 in

## 2022-05-07 DIAGNOSIS — K625 Hemorrhage of anus and rectum: Secondary | ICD-10-CM

## 2022-05-07 DIAGNOSIS — C20 Malignant neoplasm of rectum: Secondary | ICD-10-CM | POA: Diagnosis not present

## 2022-05-07 DIAGNOSIS — R531 Weakness: Secondary | ICD-10-CM | POA: Diagnosis not present

## 2022-05-07 DIAGNOSIS — J9601 Acute respiratory failure with hypoxia: Secondary | ICD-10-CM

## 2022-05-07 DIAGNOSIS — I4819 Other persistent atrial fibrillation: Secondary | ICD-10-CM

## 2022-05-07 DIAGNOSIS — J9602 Acute respiratory failure with hypercapnia: Secondary | ICD-10-CM

## 2022-05-07 DIAGNOSIS — D500 Iron deficiency anemia secondary to blood loss (chronic): Secondary | ICD-10-CM | POA: Diagnosis not present

## 2022-05-07 NOTE — Progress Notes (Signed)
Subjective:     Patient ID: Daniel Sharp, male    DOB: 05/10/1933, 88 y.o.   MRN: [id number]  Chief Complaint  Patient presents with   [facility] follow-up     HPI-here w/son [facility] f/u. Admitted 04/11/22-10/24.  Then to rehab. Records reviewed.  Gross hematuria, GIB,resp failure, sepsis, rectal adenoca.  D/t testing, w/u, was NPO freq and bed bound.  Pt is now unable to ambulate so sent to rehab.   Rectal cancer-saw [facility] onc doc video. Reviewed note. Needs to build strength.  No bleeding.  Surg on hold till stable  Weakness-unable to stand now-since hosp. In rehab still.  Hadn't been eating much d/t tests/procedures.    Not eating well-getting full easily.   On O2 for sleep. [facility] labs [facility].  Was on bipap in hosp-needs sleep study.  Pulm-ordering bipap.  Sleep study sch for 2 mo.   Anemia from rectal ca bleeding.  Labs being followed at [facility].  Seeing heme tomorrow. Has gotten sev xfusions and iron infusions.  No major complaints today x weakness.  Health Maintenance Due  Topic Date Due   Medicare Annual Wellness (AWV)  Never done    Past Medical History:  Diagnosis Date   A-fib [facility])    history of   Arthritis    Dyspnea    H/O mitral valve repair 07/2003   Hypertension    Ocular myasthenia gravis ([place])    takes pred   Pneumonia     Past Surgical History:  Procedure Laterality Date   APPENDECTOMY     BACK SURGERY     cyst removed L3 and L4   BIOPSY  04/19/2022   Procedure: BIOPSY;  Surgeon: Irving Copas., MD;  Location: [facility];  Service: Gastroenterology;;   CHOLECYSTECTOMY     COLONOSCOPY WITH PROPOFOL N/A 04/19/2022   Procedure: COLONOSCOPY WITH PROPOFOL;  Surgeon: Irving Copas., MD;  Location: [facility];  Service: Gastroenterology;  Laterality: N/A;   ENDOSCOPIC MUCOSAL RESECTION  04/19/2022   Procedure: ENDOSCOPIC MUCOSAL RESECTION;  Surgeon: Rush Landmark Telford Nab., MD;  Location: [facility];  Service:  Gastroenterology;;   ESOPHAGOGASTRODUODENOSCOPY (EGD) WITH PROPOFOL N/A 04/19/2022   Procedure: ESOPHAGOGASTRODUODENOSCOPY (EGD) WITH PROPOFOL;  Surgeon: Irving Copas., MD;  Location: [facility];  Service: Gastroenterology;  Laterality: N/A;   FINGER SURGERY Right    R hand   HEMOSTASIS CLIP PLACEMENT  04/19/2022   Procedure: HEMOSTASIS CLIP PLACEMENT;  Surgeon: Irving Copas., MD;  Location: [facility];  Service: Gastroenterology;;   MITRAL VALVE REPAIR     on coumadin   NOSE SURGERY     POLYPECTOMY  04/19/2022   Procedure: POLYPECTOMY;  Surgeon: Rush Landmark Telford Nab., MD;  Location: [facility];  Service: Gastroenterology;;   ROBOTIC ASSITED PARTIAL NEPHRECTOMY Left 01/22/2022   Procedure: XI ROBOTIC ASSITED NEPHRECTOMY;  Surgeon: Ceasar Mons, MD;  Location: [facility];  Service: Urology;  Laterality: Left;   SUBMUCOSAL LIFTING INJECTION  04/19/2022   Procedure: SUBMUCOSAL LIFTING INJECTION;  Surgeon: Rush Landmark Telford Nab., MD;  Location: [facility];  Service: Gastroenterology;;   SUBMUCOSAL TATTOO INJECTION  04/19/2022   Procedure: SUBMUCOSAL TATTOO INJECTION;  Surgeon: Irving Copas., MD;  Location: [facility];  Service: Gastroenterology;;    Outpatient Medications Prior to Visit  Medication Sig Dispense Refill   carboxymethylcellulose (REFRESH PLUS) 0.5 % SOLN 1 drop daily as needed (dry eyes).     Cholecalciferol (VITAMIN D3 PO) Take 1 tablet by mouth daily.  docusate sodium (COLACE) 100 MG capsule Take 1 capsule (100 mg total) by mouth 2 (two) times daily. (Patient taking differently: Take 100 mg by mouth daily.)     feeding supplement (ENSURE ENLIVE / ENSURE PLUS) LIQD Take 237 mLs by mouth daily. 237 mL 12   ferrous sulfate 325 (65 FE) MG tablet Take 325 mg by mouth daily with breakfast.     finasteride (PROSCAR) 5 MG tablet Take 1 tablet (5 mg total) by mouth daily. 30 tablet 1   furosemide (LASIX) 20 MG tablet Take 20 mg by  mouth daily.     glucosamine-chondroitin 500-400 MG tablet Take 1 tablet by mouth daily.     linezolid (ZYVOX) 600 MG tablet Take 600 mg by mouth 2 (two) times daily. 2 times daily for 10 days     pantoprazole (PROTONIX) 40 MG tablet Take 1 tablet (40 mg total) by mouth 2 (two) times daily. 60 tablet 2   predniSONE (DELTASONE) 5 MG tablet Take 2 tablets (10 mg total) by mouth daily. (Patient taking differently: Take 10 mg by mouth daily. Taking 2 tabs by mouth daily) 180 tablet 1   pyridostigmine (MESTINON) 60 MG tablet Take 1 tablet (60 mg total) by mouth every 8 (eight) hours.     tamsulosin (FLOMAX) 0.4 MG CAPS capsule Take 0.4 mg by mouth daily.     Cholecalciferol (D3-1000) 25 MCG (1000 UT) capsule 1 capsule DAILY (route: oral)     HYDROcodone-acetaminophen (NORCO/VICODIN) 5-325 MG tablet Take 1-2 tablets by mouth every 6 (six) hours as needed for moderate pain or severe pain. 15 tablet 0   linezolid (ZYVOX) 600 MG tablet Take 1 tablet (600 mg total) by mouth 2 (two) times daily for 10 days. 20 tablet 0   No facility-administered medications prior to visit.    Allergies  Allergen Reactions   Aminoglycosides     Hx of MG    Botox [Onabotulinumtoxina]     Hx of MG    Corticosteroids     Caution with high doses as Hx of MG    Hydroxychloroquine     Hx of MG    Levaquin [Levofloxacin]     Hx of MG  - avoid all Fluoroquinolones   Macrolides And Ketolides     Hx of MG    Magnesium-Containing Compounds     Use with caution as pt with Hx of MG    Neuromuscular Blocking Agents     Hx of MG    Penicillamine     Hx of MG    Procainamide     Hx of MG    Quinidine     Hx of MG    ROS neg/noncontributory except as noted HPI/below      Objective:     BP 117/66   Pulse 100   Temp 97.9 F (36.6 C) (Temporal)   Ht '6\' 1"'$  (1.854 m)   SpO2 93%   BMI 25.01 kg/m  Wt Readings from Last 3 Encounters:  04/29/22 189 lb 9.5 oz (86 kg)  04/04/22 196 lb 8 oz (89.1 kg)  04/03/22 194 lb  1.3 oz (88 kg)    Physical Exam   Gen: WDWN NAD, pale, elderly wm HEENT: NCAT, conjunctiva not injected, sclera nonicteric NECK:  supple, no thyromegaly, no nodes, no carotid bruits CARDIAC: irRRR, S1S2+ LUNGS: CTAB. No wheezes ABDOMEN:  BS+, soft, NTND, No HSM, no masses EXT:  1+ edema MSK: in w/c.  Can't lift legs.  NEURO: A&O x3.  CN II-XII intact.  PSYCH: normal mood. Good eye contact     Assessment & Plan:   Problem List Items Addressed This Visit       Cardiovascular and Mediastinum   Persistent atrial fibrillation (HCC)     Respiratory   Acute respiratory failure with hypoxia and hypercapnia (HCC)     Digestive   GIB (gastrointestinal bleeding) - Primary   Rectal adenocarcinoma (HCC)   Relevant Medications   linezolid (ZYVOX) 600 MG tablet     Other   Generalized weakness   IDA (iron deficiency anemia)  1.  Atrial fibrillation-chronic.  Well-controlled on medications.  Anticoagulation is being held due to [facility] and GI bleed.  Son is aware that it is difficult situation-risk of CVA from not being anticoagulated, however, due to active bleeding, cannot be on anticoagulants. 2.  Respiratory failure with hypoxia and hypercapnia-more when hospitalized, however he still desats overnight.  Currently on O2 at [facility] the [facility].  Pulmonary is working on getting BiPAP. 3.  GI bleed-found to be due to rectal adenocarcinoma-patient was stabilized in the hospital.  This is going to be an ongoing problem.  He is to week for surgery, not felt to be a great candidate for chemo/radiation type treatments.  Due to his multiple comorbidities, he has seen the oncologist at [facility].  Notes were reviewed.  Appreciate their input.  It looks like the current plan is to try to improve his ambulatory status and then consider surgical resection.  He may need recurrent transfusions. 4.  Iron deficiency anemia-from chronic GI bleed due to rectal adenocarcinoma.  He has had several blood transfusions  and iron infusions.  He is being followed by hematology.  Has an appointment tomorrow 5.  Generalized weakness-patient had an left nephrectomy due to renal cell carcinoma in July.  Recovery has been slow, but he was ambulatory.  This past hospitalization, patient was kept n.p.o. several times for testing and frequently on bedrest.  His mobility severely declined.  He cannot support himself and barely move his legs.  He is currently in an ECF getting PT, OT.  Son's goal is to get patient back to where he was and come back home living with the son.  This may or may not be accomplished, but time will tell  Follow-up in 1 month  No orders of the defined types were placed in this encounter.   Wellington Hampshire, MD

## 2022-05-07 NOTE — Patient Instructions (Signed)

## 2022-05-07 NOTE — Telephone Encounter (Signed)
Ok with me. thanks 

## 2022-05-07 NOTE — Telephone Encounter (Signed)
Called and spoke to pt's son, [name]. Order has been placed for Bipap. Informed him of the recs per Dr. Silas Flood. [name] was very thankful for the bipap order preceding the sleep study. [name] states the patient would like whatever sleep study that can be done the soonest. Per [facility]'s an in-lab study is about 4-5 weeks out whereas the HST is about 12 weeks out to schedule. Called sleep center [phone]) and spoke with Lynnae Sandhoff to confirm the wait, Lynnae Sandhoff also mentioned for pt to be placed on bipap with no prior sleep study or documented OSA history then the best order to start would be a Split night and an order for CPAP with the note "transition to bilevel with low threshold". Lynnae Sandhoff states this would be the quickest way for pt to get his bipap even if he is paying out of pocket.   Dr. Silas Flood, please advise if ok to order an in-lab study and if you would like a split night or npsg. Thanks.

## 2022-05-08 ENCOUNTER — Inpatient Hospital Stay: Payer: Medicare HMO | Attending: Hematology & Oncology

## 2022-05-08 ENCOUNTER — Encounter: Payer: Self-pay | Admitting: Family

## 2022-05-08 ENCOUNTER — Inpatient Hospital Stay (HOSPITAL_BASED_OUTPATIENT_CLINIC_OR_DEPARTMENT_OTHER): Payer: Medicare HMO | Admitting: Family

## 2022-05-08 VITALS — BP 114/60 | HR 108 | Temp 96.3°F | Resp 20

## 2022-05-08 DIAGNOSIS — C20 Malignant neoplasm of rectum: Secondary | ICD-10-CM | POA: Insufficient documentation

## 2022-05-08 DIAGNOSIS — D500 Iron deficiency anemia secondary to blood loss (chronic): Secondary | ICD-10-CM | POA: Insufficient documentation

## 2022-05-08 DIAGNOSIS — D649 Anemia, unspecified: Secondary | ICD-10-CM

## 2022-05-08 DIAGNOSIS — K922 Gastrointestinal hemorrhage, unspecified: Secondary | ICD-10-CM | POA: Insufficient documentation

## 2022-05-08 LAB — CBC WITH DIFFERENTIAL (CANCER CENTER ONLY)
Abs Immature Granulocytes: 0.09 10*3/uL — ABNORMAL HIGH (ref 0.00–0.07)
Basophils Absolute: 0 10*3/uL (ref 0.0–0.1)
Basophils Relative: 0 %
Eosinophils Absolute: 0.1 10*3/uL (ref 0.0–0.5)
Eosinophils Relative: 1 %
HCT: 28 % — ABNORMAL LOW (ref 39.0–52.0)
Hemoglobin: 7.8 g/dL — ABNORMAL LOW (ref 13.0–17.0)
Immature Granulocytes: 1 %
Lymphocytes Relative: 7 %
Lymphs Abs: 0.7 10*3/uL (ref 0.7–4.0)
MCH: 28.3 pg (ref 26.0–34.0)
MCHC: 27.9 g/dL — ABNORMAL LOW (ref 30.0–36.0)
MCV: 101.4 fL — ABNORMAL HIGH (ref 80.0–100.0)
Monocytes Absolute: 0.4 10*3/uL (ref 0.1–1.0)
Monocytes Relative: 4 %
Neutro Abs: 9.1 10*3/uL — ABNORMAL HIGH (ref 1.7–7.7)
Neutrophils Relative %: 87 %
Platelet Count: 210 10*3/uL (ref 150–400)
RBC: 2.76 MIL/uL — ABNORMAL LOW (ref 4.22–5.81)
RDW: 15.2 % (ref 11.5–15.5)
WBC Count: 10.4 10*3/uL (ref 4.0–10.5)
nRBC: 0 % (ref 0.0–0.2)

## 2022-05-08 LAB — CMP (CANCER CENTER ONLY)
ALT: 20 U/L (ref 0–44)
AST: 13 U/L — ABNORMAL LOW (ref 15–41)
Albumin: 3.1 g/dL — ABNORMAL LOW (ref 3.5–5.0)
Alkaline Phosphatase: 118 U/L (ref 38–126)
Anion gap: 3 — ABNORMAL LOW (ref 5–15)
BUN: 66 mg/dL — ABNORMAL HIGH (ref 8–23)
CO2: 32 mmol/L (ref 22–32)
Calcium: 11.1 mg/dL — ABNORMAL HIGH (ref 8.9–10.3)
Chloride: 108 mmol/L (ref 98–111)
Creatinine: 1.23 mg/dL (ref 0.61–1.24)
GFR, Estimated: 57 mL/min — ABNORMAL LOW (ref >60)
Glucose, Bld: 127 mg/dL — ABNORMAL HIGH (ref 70–99)
Potassium: 4.7 mmol/L (ref 3.5–5.1)
Sodium: 143 mmol/L (ref 135–145)
Total Bilirubin: 0.5 mg/dL (ref 0.3–1.2)
Total Protein: 7 g/dL (ref 6.5–8.1)

## 2022-05-08 LAB — RETICULOCYTES
Immature Retic Fract: 15.2 % (ref 2.3–15.9)
RBC.: 2.76 MIL/uL — ABNORMAL LOW (ref 4.22–5.81)
Retic Count, Absolute: 79.2 10*3/uL (ref 19.0–186.0)
Retic Ct Pct: 2.9 % (ref 0.4–3.1)

## 2022-05-08 LAB — IRON AND IRON BINDING CAPACITY (CC-WL,HP ONLY)
Iron: 23 ug/dL — ABNORMAL LOW (ref 45–182)
Saturation Ratios: 13 % — ABNORMAL LOW (ref 17.9–39.5)
TIBC: 176 ug/dL — ABNORMAL LOW (ref 250–450)
UIBC: 153 ug/dL (ref 117–376)

## 2022-05-08 LAB — SAMPLE TO BLOOD BANK

## 2022-05-08 LAB — FERRITIN: Ferritin: 1134 ng/mL — ABNORMAL HIGH (ref 24–336)

## 2022-05-08 LAB — PREPARE RBC (CROSSMATCH)

## 2022-05-08 NOTE — Progress Notes (Signed)
Hematology and Oncology Follow Up Visit  Daniel Sharp [id number] Jun 22, 1933 88 y.o. 05/08/2022   Principle Diagnosis:  Iron deficiency anemia secondary to GI blood loss  Rectal adenocarcinoma ([id number]) diagnosed with MRI, followed by [facility] Dr. Lucrezia Europe Ang Maxie Better, MD  Current Therapy:   IV iron as indicated  Transfusional support as needed.    Interim History:  Daniel Sharp is here today with his son for follow-up. He is quite fatigued and weak. He was recently diagnosed with rectal adenocarcinoma by [facility] oncology and was given several options for treatment per his son. At this time they are opting to hold off on surgery or chemo due to his feeling poorly.  Hgb at this time is 7.8, MCV 101, platelets 210 and WBC count 10.4.  The rectal mass is known to be oozing blood. He has stopped Coumadin to help reduce blood loss.  No other blood loss noted. No petechiae.  They hope that with the correction of his anemia he will then be able to reconsider treatment.  No fever, chills, n/v, cough, rash, dizziness, SOB, chest pain, palpitations, abdominal pain or changes in bladder habits.  He has mild swelling in his feet and ankles. Pedal pulses are 1+.  He has generalized aches and pains all over.  No falls or syncope reported.  Appetite and hydration are fair. He was unable to stand for a weight today.   ECOG Performance Status: 2 - Symptomatic, <50% confined to bed  Medications:  Allergies as of 05/08/2022       Reactions   Aminoglycosides    Hx of MG    Botox [onabotulinumtoxina]    Hx of MG    Corticosteroids    Caution with high doses as Hx of MG    Hydroxychloroquine    Hx of MG    Levaquin [levofloxacin]    Hx of MG  - avoid all Fluoroquinolones   Macrolides And Ketolides    Hx of MG    Magnesium-containing Compounds    Use with caution as pt with Hx of MG    Neuromuscular Blocking Agents    Hx of MG    Penicillamine    Hx of MG    Procainamide    Hx of MG    Quinidine    Hx  of MG         Medication List        Accurate as of May 08, 2022  2:01 PM. If you have any questions, ask your nurse or doctor.          carboxymethylcellulose 0.5 % Soln Commonly known as: REFRESH PLUS 1 drop daily as needed (dry eyes).   docusate sodium 100 MG capsule Commonly known as: COLACE Take 1 capsule (100 mg total) by mouth 2 (two) times daily. What changed: when to take this   feeding supplement Liqd Take 237 mLs by mouth daily.   ferrous sulfate 325 (65 FE) MG tablet Take 325 mg by mouth daily with breakfast.   finasteride 5 MG tablet Commonly known as: PROSCAR Take 1 tablet (5 mg total) by mouth daily.   furosemide 20 MG tablet Commonly known as: LASIX Take 20 mg by mouth daily.   glucosamine-chondroitin 500-400 MG tablet Take 1 tablet by mouth daily.   linezolid 600 MG tablet Commonly known as: ZYVOX Take 600 mg by mouth 2 (two) times daily. 2 times daily for 10 days   pantoprazole 40 MG tablet Commonly known as: PROTONIX Take 1  tablet (40 mg total) by mouth 2 (two) times daily.   predniSONE 5 MG tablet Commonly known as: DELTASONE Take 2 tablets (10 mg total) by mouth daily. What changed: additional instructions   pyridostigmine 60 MG tablet Commonly known as: Mestinon Take 1 tablet (60 mg total) by mouth every 8 (eight) hours.   tamsulosin 0.4 MG Caps capsule Commonly known as: FLOMAX Take 0.4 mg by mouth daily.   VITAMIN D3 PO Take 1 tablet by mouth daily.        Allergies:  Allergies  Allergen Reactions   Aminoglycosides     Hx of MG    Botox [Onabotulinumtoxina]     Hx of MG    Corticosteroids     Caution with high doses as Hx of MG    Hydroxychloroquine     Hx of MG    Levaquin [Levofloxacin]     Hx of MG  - avoid all Fluoroquinolones   Macrolides And Ketolides     Hx of MG    Magnesium-Containing Compounds     Use with caution as pt with Hx of MG    Neuromuscular Blocking Agents     Hx of MG     Penicillamine     Hx of MG    Procainamide     Hx of MG    Quinidine     Hx of MG     Past Medical History, Surgical history, Social history, and Family History were reviewed and updated.  Review of Systems: All other 10 point review of systems is negative.   Physical Exam:  axillary temperature is 96.3 F (35.7 C) (abnormal). His blood pressure is 114/60 and his pulse is 108 (abnormal). His respiration is 20 and oxygen saturation is 97%.   Wt Readings from Last 3 Encounters:  04/29/22 189 lb 9.5 oz (86 kg)  04/04/22 196 lb 8 oz (89.1 kg)  04/03/22 194 lb 1.3 oz (88 kg)    Ocular: Sclerae unicteric, pupils equal, round and reactive to light Ear-nose-throat: Oropharynx clear, dentition fair Lymphatic: No cervical or supraclavicular adenopathy Lungs no rales or rhonchi, good excursion bilaterally Heart regular rate and rhythm, no murmur appreciated Abd soft, nontender, positive bowel sounds MSK no focal spinal tenderness, no joint edema Neuro: non-focal, well-oriented, appropriate affect Breasts: Deferred   Lab Results  Component Value Date   WBC 10.4 05/08/2022   HGB 7.8 (L) 05/08/2022   HCT 28.0 (L) 05/08/2022   MCV 101.4 (H) 05/08/2022   PLT 210 05/08/2022   Lab Results  Component Value Date   FERRITIN 757 (H) 04/12/2022   IRON 23 (L) 05/08/2022   TIBC 176 (L) 05/08/2022   UIBC 153 05/08/2022   IRONPCTSAT 13 (L) 05/08/2022   Lab Results  Component Value Date   RETICCTPCT 2.9 05/08/2022   RBC 2.76 (L) 05/08/2022   No results found for: "KPAFRELGTCHN", "LAMBDASER", "KAPLAMBRATIO" No results found for: "IGGSERUM", "IGA", "IGMSERUM" No results found for: "TOTALPROTELP", "ALBUMINELP", "A1GS", "A2GS", "BETS", "BETA2SER", "GAMS", "MSPIKE", "SPEI"   Chemistry      Component Value Date/Time   NA 143 05/08/2022 1005   K 4.7 05/08/2022 1005   CL 108 05/08/2022 1005   CO2 32 05/08/2022 1005   BUN 66 (H) 05/08/2022 1005   CREATININE 1.23 05/08/2022 1005       Component Value Date/Time   CALCIUM 11.1 (H) 05/08/2022 1005   CALCIUM 11.9 (H) 04/18/2022 1026   ALKPHOS 118 05/08/2022 1005   AST 13 (L) 05/08/2022  1005   ALT 20 05/08/2022 1005   BILITOT 0.5 05/08/2022 1005       Impression and Plan: Daniel Sharp is a very pleasant 88 yo caucasian gentleman with recent anemia secondary to GI blood loss. He has now been diagnosed with rectal adenocarcinoma but wants to address the anemia and feel better prior to starting any type of treatment.  We will give him 2 units of blood and the first of 3 doses of IV iron tomorrow.  Follow-up in 3 weeks.   Lottie Dawson, NP 11/1/20232:01 PM

## 2022-05-09 ENCOUNTER — Inpatient Hospital Stay: Payer: Medicare HMO

## 2022-05-09 DIAGNOSIS — D500 Iron deficiency anemia secondary to blood loss (chronic): Secondary | ICD-10-CM

## 2022-05-09 DIAGNOSIS — C20 Malignant neoplasm of rectum: Secondary | ICD-10-CM | POA: Diagnosis not present

## 2022-05-09 DIAGNOSIS — D649 Anemia, unspecified: Secondary | ICD-10-CM

## 2022-05-09 MED ORDER — ACETAMINOPHEN 325 MG PO TABS
650.0000 mg | ORAL_TABLET | Freq: Once | ORAL | Status: AC
  Administered 2022-05-09: 650 mg via ORAL
  Filled 2022-05-09: qty 2

## 2022-05-09 MED ORDER — DIPHENHYDRAMINE HCL 25 MG PO CAPS
25.0000 mg | ORAL_CAPSULE | Freq: Once | ORAL | Status: AC
  Administered 2022-05-09: 25 mg via ORAL
  Filled 2022-05-09: qty 1

## 2022-05-09 MED ORDER — SODIUM CHLORIDE 0.9% IV SOLUTION
250.0000 mL | Freq: Once | INTRAVENOUS | Status: AC
  Administered 2022-05-09: 250 mL via INTRAVENOUS

## 2022-05-09 MED ORDER — FUROSEMIDE 10 MG/ML IJ SOLN
20.0000 mg | Freq: Once | INTRAMUSCULAR | Status: AC
  Administered 2022-05-09: 20 mg via INTRAVENOUS
  Filled 2022-05-09: qty 4

## 2022-05-09 NOTE — Patient Instructions (Signed)
Blood Transfusion, Adult A blood transfusion is a procedure in which you receive blood or a type of blood cell (blood component) through an IV. You may need a blood transfusion when you have a low blood count, which is a low number of any blood cell. This may result from a bleeding disorder, illness, injury, or surgery. The blood may come from a donor, or you may be able to have your own blood collected and stored (autologous blood donation) before a planned surgery. The blood given in a transfusion may be made up of different blood components. You may receive: Red blood cells. These carry oxygen to the cells in the body. Platelets. These help your blood to clot. Plasma. This is the liquid part of your blood. It carries proteins and other substances throughout the body. White blood cells. These help you fight infections. If you have hemophilia or another clotting disorder, you may also receive other types of blood products. Depending on the type of blood product, this procedure may take 1-4 hours to complete. Tell a health care provider about: Any bleeding problems you have. Any previous reactions you have had during a blood transfusion. Any allergies you have. All medicines you are taking, including vitamins, herbs, eye drops, creams, and over-the-counter medicines. Any surgeries you have had. Any medical conditions you have. Whether you are pregnant or may be pregnant. What are the risks? Talk with your health care provider about risks. The most common problems include: A mild allergic reaction, such as red, swollen areas of skin (hives) and itching. Fever or chills. This may be the body's response to new blood cells received. This may occur during or up to 4 hours after the transfusion. More serious problems may include: A serious allergic reaction that causes difficulty breathing or swelling around the face and lips. Transfusion-associated circulatory overload (TACO), or too much fluid in  the lungs. This may cause breathing problems. Transfusion-related acute lung injury (TRALI), which causes breathing difficulty and low oxygen in the blood. This can occur within hours of the transfusion or several days later. Iron overload. This can happen after receiving many blood transfusions over a period of time. Infection or virus being transmitted. This is rare because donated blood is carefully tested before it is given. Hemolytic transfusion reaction. This is rare. It happens when the body's defense system (immune system)tries to attack the new blood cells. Symptoms may include fever, chills, nausea, low blood pressure, and low back or chest pain. Transfusion-associated graft-versus-host disease (TAGVHD). This is rare. It happens when donated cells attack the body's healthy tissues. What happens before the procedure? You will have a blood test to check your blood type. This test is done to know what kind of blood your body will accept and to match it to the donor blood. If you are going to have a planned surgery, you may be able to do an autologous blood donation. This may be done in case you need to have a transfusion. You will have your temperature, blood pressure, and pulse checked before the transfusion. If you have had an allergic reaction to a transfusion in the past, you may be given medicine to help prevent a reaction. This medicine may be given to you by mouth (orally) or through an IV. What happens during the procedure?  An IV will be inserted into one of your veins. The bag of blood will be attached to your IV. The blood will then enter through your vein. Your temperature, blood pressure,   and pulse will be monitored during the transfusion. This monitoring is done to detect early signs of a transfusion reaction. Tell your nurse right away if you have any of these symptoms during the transfusion: Shortness of breath or trouble breathing. Chest or back pain. Fever or  chills. Itching or hives. If you have any signs or symptoms of a reaction, your transfusion will be stopped and you may be given medicine. When the transfusion is complete, your IV will be removed. Pressure may be applied to the IV site for a few minutes. A bandage (dressing)will be applied. The procedure may vary among health care providers and hospitals. What happens after the procedure? Your temperature, blood pressure, pulse, breathing rate, and blood oxygen level will be monitored until you leave the hospital or clinic. Your blood may be tested to see how you have responded to the transfusion. You may be warmed with fluids or blankets to maintain a normal body temperature. If you receive your blood transfusion in an outpatient setting, you will be told whom to contact to report any reactions. Where to find more information Visit the [employer]: redcross.org Summary A blood transfusion is a procedure in which you receive blood or a type of blood cell (blood component) through an IV. The blood given in a transfusion may be made up of different blood components. You may receive red blood cells, platelets, plasma, or white blood cells depending on the condition treated. Your temperature, blood pressure, and pulse will be monitored before, during, and after the transfusion. After the transfusion, your blood may be tested to see how your body has responded. This information is not intended to replace advice given to you by your health care provider. Make sure you discuss any questions you have with your health care provider. Document Revised: 09/21/2021 Document Reviewed: 09/21/2021 Elsevier Patient Education  2023 [employer]  

## 2022-05-10 ENCOUNTER — Other Ambulatory Visit: Payer: Self-pay | Admitting: Family

## 2022-05-10 LAB — TYPE AND SCREEN
ABO/RH(D): A POS
Antibody Screen: NEGATIVE
Unit division: 0
Unit division: 0

## 2022-05-10 LAB — BPAM RBC
Blood Product Expiration Date: 202311242359
Blood Product Expiration Date: 202311242359
ISSUE DATE / TIME: 202311020741
ISSUE DATE / TIME: 202311020741
Unit Type and Rh: 6200
Unit Type and Rh: 6200

## 2022-05-15 ENCOUNTER — Emergency Department (HOSPITAL_COMMUNITY): Payer: Medicare HMO

## 2022-05-15 ENCOUNTER — Inpatient Hospital Stay (HOSPITAL_COMMUNITY)
Admission: EM | Admit: 2022-05-15 | Discharge: 2022-06-07 | DRG: 177 | Disposition: E | Payer: Medicare HMO | Source: Skilled Nursing Facility | Attending: Internal Medicine | Admitting: Internal Medicine

## 2022-05-15 ENCOUNTER — Encounter: Payer: Self-pay | Admitting: Internal Medicine

## 2022-05-15 ENCOUNTER — Non-Acute Institutional Stay: Payer: Medicare HMO | Admitting: Internal Medicine

## 2022-05-15 DIAGNOSIS — G4700 Insomnia, unspecified: Secondary | ICD-10-CM | POA: Diagnosis present

## 2022-05-15 DIAGNOSIS — D631 Anemia in chronic kidney disease: Secondary | ICD-10-CM | POA: Diagnosis present

## 2022-05-15 DIAGNOSIS — E870 Hyperosmolality and hypernatremia: Secondary | ICD-10-CM | POA: Diagnosis not present

## 2022-05-15 DIAGNOSIS — Z888 Allergy status to other drugs, medicaments and biological substances status: Secondary | ICD-10-CM

## 2022-05-15 DIAGNOSIS — Z515 Encounter for palliative care: Secondary | ICD-10-CM

## 2022-05-15 DIAGNOSIS — J9621 Acute and chronic respiratory failure with hypoxia: Secondary | ICD-10-CM | POA: Diagnosis not present

## 2022-05-15 DIAGNOSIS — G9341 Metabolic encephalopathy: Secondary | ICD-10-CM | POA: Diagnosis present

## 2022-05-15 DIAGNOSIS — Z880 Allergy status to penicillin: Secondary | ICD-10-CM

## 2022-05-15 DIAGNOSIS — F05 Delirium due to known physiological condition: Secondary | ICD-10-CM

## 2022-05-15 DIAGNOSIS — I5033 Acute on chronic diastolic (congestive) heart failure: Secondary | ICD-10-CM | POA: Diagnosis present

## 2022-05-15 DIAGNOSIS — L89611 Pressure ulcer of right heel, stage 1: Secondary | ICD-10-CM | POA: Diagnosis present

## 2022-05-15 DIAGNOSIS — L89153 Pressure ulcer of sacral region, stage 3: Secondary | ICD-10-CM | POA: Diagnosis present

## 2022-05-15 DIAGNOSIS — I480 Paroxysmal atrial fibrillation: Secondary | ICD-10-CM | POA: Diagnosis present

## 2022-05-15 DIAGNOSIS — N1831 Chronic kidney disease, stage 3a: Secondary | ICD-10-CM | POA: Diagnosis present

## 2022-05-15 DIAGNOSIS — G7000 Myasthenia gravis without (acute) exacerbation: Secondary | ICD-10-CM | POA: Diagnosis present

## 2022-05-15 DIAGNOSIS — Z6825 Body mass index (BMI) 25.0-25.9, adult: Secondary | ICD-10-CM

## 2022-05-15 DIAGNOSIS — Z79899 Other long term (current) drug therapy: Secondary | ICD-10-CM | POA: Diagnosis not present

## 2022-05-15 DIAGNOSIS — D6959 Other secondary thrombocytopenia: Secondary | ICD-10-CM | POA: Diagnosis present

## 2022-05-15 DIAGNOSIS — J189 Pneumonia, unspecified organism: Secondary | ICD-10-CM

## 2022-05-15 DIAGNOSIS — R451 Restlessness and agitation: Secondary | ICD-10-CM

## 2022-05-15 DIAGNOSIS — J9622 Acute and chronic respiratory failure with hypercapnia: Secondary | ICD-10-CM | POA: Diagnosis present

## 2022-05-15 DIAGNOSIS — I110 Hypertensive heart disease with heart failure: Secondary | ICD-10-CM | POA: Diagnosis not present

## 2022-05-15 DIAGNOSIS — C20 Malignant neoplasm of rectum: Secondary | ICD-10-CM

## 2022-05-15 DIAGNOSIS — Z952 Presence of prosthetic heart valve: Secondary | ICD-10-CM | POA: Diagnosis not present

## 2022-05-15 DIAGNOSIS — R040 Epistaxis: Secondary | ICD-10-CM | POA: Diagnosis not present

## 2022-05-15 DIAGNOSIS — J159 Unspecified bacterial pneumonia: Secondary | ICD-10-CM | POA: Diagnosis present

## 2022-05-15 DIAGNOSIS — I083 Combined rheumatic disorders of mitral, aortic and tricuspid valves: Secondary | ICD-10-CM | POA: Diagnosis present

## 2022-05-15 DIAGNOSIS — R627 Adult failure to thrive: Secondary | ICD-10-CM | POA: Diagnosis not present

## 2022-05-15 DIAGNOSIS — I340 Nonrheumatic mitral (valve) insufficiency: Secondary | ICD-10-CM | POA: Diagnosis not present

## 2022-05-15 DIAGNOSIS — R54 Age-related physical debility: Secondary | ICD-10-CM | POA: Diagnosis present

## 2022-05-15 DIAGNOSIS — J1282 Pneumonia due to coronavirus disease 2019: Secondary | ICD-10-CM | POA: Diagnosis present

## 2022-05-15 DIAGNOSIS — I482 Chronic atrial fibrillation, unspecified: Secondary | ICD-10-CM | POA: Diagnosis present

## 2022-05-15 DIAGNOSIS — Z905 Acquired absence of kidney: Secondary | ICD-10-CM

## 2022-05-15 DIAGNOSIS — R0600 Dyspnea, unspecified: Secondary | ICD-10-CM

## 2022-05-15 DIAGNOSIS — Z87891 Personal history of nicotine dependence: Secondary | ICD-10-CM | POA: Diagnosis not present

## 2022-05-15 DIAGNOSIS — C642 Malignant neoplasm of left kidney, except renal pelvis: Secondary | ICD-10-CM

## 2022-05-15 DIAGNOSIS — B952 Enterococcus as the cause of diseases classified elsewhere: Secondary | ICD-10-CM | POA: Diagnosis present

## 2022-05-15 DIAGNOSIS — Z66 Do not resuscitate: Secondary | ICD-10-CM | POA: Diagnosis present

## 2022-05-15 DIAGNOSIS — E875 Hyperkalemia: Secondary | ICD-10-CM | POA: Diagnosis present

## 2022-05-15 DIAGNOSIS — I700 Atherosclerosis of aorta: Secondary | ICD-10-CM | POA: Diagnosis present

## 2022-05-15 DIAGNOSIS — A498 Other bacterial infections of unspecified site: Secondary | ICD-10-CM | POA: Diagnosis not present

## 2022-05-15 DIAGNOSIS — Z7189 Other specified counseling: Secondary | ICD-10-CM

## 2022-05-15 DIAGNOSIS — Z7952 Long term (current) use of systemic steroids: Secondary | ICD-10-CM

## 2022-05-15 DIAGNOSIS — M1990 Unspecified osteoarthritis, unspecified site: Secondary | ICD-10-CM | POA: Diagnosis present

## 2022-05-15 DIAGNOSIS — I517 Cardiomegaly: Secondary | ICD-10-CM | POA: Diagnosis not present

## 2022-05-15 DIAGNOSIS — U071 COVID-19: Principal | ICD-10-CM | POA: Diagnosis present

## 2022-05-15 DIAGNOSIS — R7881 Bacteremia: Secondary | ICD-10-CM | POA: Diagnosis present

## 2022-05-15 DIAGNOSIS — Z9049 Acquired absence of other specified parts of digestive tract: Secondary | ICD-10-CM

## 2022-05-15 DIAGNOSIS — Z8701 Personal history of pneumonia (recurrent): Secondary | ICD-10-CM

## 2022-05-15 DIAGNOSIS — N400 Enlarged prostate without lower urinary tract symptoms: Secondary | ICD-10-CM | POA: Diagnosis present

## 2022-05-15 DIAGNOSIS — K117 Disturbances of salivary secretion: Secondary | ICD-10-CM

## 2022-05-15 DIAGNOSIS — F419 Anxiety disorder, unspecified: Secondary | ICD-10-CM | POA: Diagnosis present

## 2022-05-15 DIAGNOSIS — E8809 Other disorders of plasma-protein metabolism, not elsewhere classified: Secondary | ICD-10-CM | POA: Diagnosis present

## 2022-05-15 DIAGNOSIS — R531 Weakness: Secondary | ICD-10-CM | POA: Diagnosis not present

## 2022-05-15 DIAGNOSIS — Z823 Family history of stroke: Secondary | ICD-10-CM

## 2022-05-15 DIAGNOSIS — I130 Hypertensive heart and chronic kidney disease with heart failure and stage 1 through stage 4 chronic kidney disease, or unspecified chronic kidney disease: Secondary | ICD-10-CM | POA: Diagnosis present

## 2022-05-15 DIAGNOSIS — E43 Unspecified severe protein-calorie malnutrition: Secondary | ICD-10-CM | POA: Diagnosis not present

## 2022-05-15 DIAGNOSIS — I509 Heart failure, unspecified: Secondary | ICD-10-CM | POA: Diagnosis not present

## 2022-05-15 DIAGNOSIS — Z881 Allergy status to other antibiotic agents status: Secondary | ICD-10-CM

## 2022-05-15 DIAGNOSIS — R52 Pain, unspecified: Secondary | ICD-10-CM

## 2022-05-15 DIAGNOSIS — Z85528 Personal history of other malignant neoplasm of kidney: Secondary | ICD-10-CM

## 2022-05-15 LAB — CBC WITH DIFFERENTIAL/PLATELET
Abs Immature Granulocytes: 0.06 10*3/uL (ref 0.00–0.07)
Basophils Absolute: 0 10*3/uL (ref 0.0–0.1)
Basophils Relative: 0 %
Eosinophils Absolute: 0 10*3/uL (ref 0.0–0.5)
Eosinophils Relative: 0 %
HCT: 34.7 % — ABNORMAL LOW (ref 39.0–52.0)
Hemoglobin: 9.9 g/dL — ABNORMAL LOW (ref 13.0–17.0)
Immature Granulocytes: 1 %
Lymphocytes Relative: 6 %
Lymphs Abs: 0.6 10*3/uL — ABNORMAL LOW (ref 0.7–4.0)
MCH: 29.4 pg (ref 26.0–34.0)
MCHC: 28.5 g/dL — ABNORMAL LOW (ref 30.0–36.0)
MCV: 103 fL — ABNORMAL HIGH (ref 80.0–100.0)
Monocytes Absolute: 0.3 10*3/uL (ref 0.1–1.0)
Monocytes Relative: 3 %
Neutro Abs: 9.9 10*3/uL — ABNORMAL HIGH (ref 1.7–7.7)
Neutrophils Relative %: 90 %
Platelets: 164 10*3/uL (ref 150–400)
RBC: 3.37 MIL/uL — ABNORMAL LOW (ref 4.22–5.81)
RDW: 15.7 % — ABNORMAL HIGH (ref 11.5–15.5)
WBC: 10.9 10*3/uL — ABNORMAL HIGH (ref 4.0–10.5)
nRBC: 0 % (ref 0.0–0.2)

## 2022-05-15 LAB — RESPIRATORY PANEL BY PCR

## 2022-05-15 LAB — BLOOD GAS, VENOUS
Acid-Base Excess: 6.6 mmol/L — ABNORMAL HIGH (ref 0.0–2.0)
Bicarbonate: 36.2 mmol/L — ABNORMAL HIGH (ref 20.0–28.0)
O2 Saturation: 95 %
Patient temperature: 37
pCO2, Ven: 77 mmHg (ref 44–60)
pH, Ven: 7.28 (ref 7.25–7.43)
pO2, Ven: 67 mmHg — ABNORMAL HIGH (ref 32–45)

## 2022-05-15 LAB — COMPREHENSIVE METABOLIC PANEL
ALT: 15 U/L (ref 0–44)
AST: 12 U/L — ABNORMAL LOW (ref 15–41)
Albumin: 2.9 g/dL — ABNORMAL LOW (ref 3.5–5.0)
Alkaline Phosphatase: 97 U/L (ref 38–126)
Anion gap: 6 (ref 5–15)
BUN: 56 mg/dL — ABNORMAL HIGH (ref 8–23)
CO2: 32 mmol/L (ref 22–32)
Calcium: 10.8 mg/dL — ABNORMAL HIGH (ref 8.9–10.3)
Chloride: 107 mmol/L (ref 98–111)
Creatinine, Ser: 1.21 mg/dL (ref 0.61–1.24)
GFR, Estimated: 58 mL/min — ABNORMAL LOW (ref >60)
Glucose, Bld: 111 mg/dL — ABNORMAL HIGH (ref 70–99)
Potassium: 5.3 mmol/L — ABNORMAL HIGH (ref 3.5–5.1)
Sodium: 145 mmol/L (ref 135–145)
Total Bilirubin: 0.8 mg/dL (ref 0.3–1.2)
Total Protein: 7 g/dL (ref 6.5–8.1)

## 2022-05-15 LAB — CBC
HCT: 31.5 % — ABNORMAL LOW (ref 39.0–52.0)
Hemoglobin: 8.9 g/dL — ABNORMAL LOW (ref 13.0–17.0)
MCH: 29.1 pg (ref 26.0–34.0)
MCHC: 28.3 g/dL — ABNORMAL LOW (ref 30.0–36.0)
MCV: 102.9 fL — ABNORMAL HIGH (ref 80.0–100.0)
Platelets: 158 10*3/uL (ref 150–400)
RBC: 3.06 MIL/uL — ABNORMAL LOW (ref 4.22–5.81)
RDW: 16 % — ABNORMAL HIGH (ref 11.5–15.5)
WBC: 8.1 10*3/uL (ref 4.0–10.5)
nRBC: 0 % (ref 0.0–0.2)

## 2022-05-15 LAB — CREATININE, SERUM
Creatinine, Ser: 0.99 mg/dL (ref 0.61–1.24)
GFR, Estimated: >60 mL/min (ref >60)

## 2022-05-15 LAB — RESP PANEL BY RT-PCR (FLU A&B, COVID) ARPGX2
Influenza A by PCR: NEGATIVE
Influenza B by PCR: NEGATIVE
SARS Coronavirus 2 by RT PCR: POSITIVE — AB

## 2022-05-15 LAB — URINALYSIS, ROUTINE W REFLEX MICROSCOPIC
Bilirubin Urine: NEGATIVE
Glucose, UA: NEGATIVE mg/dL
Hgb urine dipstick: NEGATIVE
Ketones, ur: NEGATIVE mg/dL
Leukocytes,Ua: NEGATIVE
Nitrite: NEGATIVE
Protein, ur: NEGATIVE mg/dL
Specific Gravity, Urine: 1.013 (ref 1.005–1.030)
pH: 5 (ref 5.0–8.0)

## 2022-05-15 LAB — BRAIN NATRIURETIC PEPTIDE
B Natriuretic Peptide: 602.4 pg/mL — ABNORMAL HIGH (ref 0.0–100.0)
B Natriuretic Peptide: 393.7 pg/mL — ABNORMAL HIGH (ref 0.0–100.0)

## 2022-05-15 LAB — LACTIC ACID, PLASMA
Lactic Acid, Venous: 1 mmol/L (ref 0.5–1.9)
Lactic Acid, Venous: 1.1 mmol/L (ref 0.5–1.9)

## 2022-05-15 LAB — D-DIMER, QUANTITATIVE: D-Dimer, Quant: 1.05 ug/mL-FEU — ABNORMAL HIGH (ref 0.00–0.50)

## 2022-05-15 LAB — FERRITIN: Ferritin: 602 ng/mL — ABNORMAL HIGH (ref 24–336)

## 2022-05-15 LAB — MRSA NEXT GEN BY PCR, NASAL: MRSA by PCR Next Gen: DETECTED — AB

## 2022-05-15 MED ORDER — ORAL CARE MOUTH RINSE: 15.0000 mL | OROMUCOSAL | Status: DC | PRN

## 2022-05-15 MED ORDER — ONDANSETRON HCL 4 MG PO TABS
4.0000 mg | ORAL_TABLET | Freq: Four times a day (QID) | ORAL | Status: DC | PRN
  Administered 2022-05-20: 4 mg via ORAL
  Filled 2022-05-15: qty 1

## 2022-05-15 MED ORDER — CHLORHEXIDINE GLUCONATE CLOTH 2 % EX PADS
6.0000 | MEDICATED_PAD | Freq: Every day | CUTANEOUS | Status: DC
  Administered 2022-05-15 – 2022-05-17 (×3): 6 via TOPICAL

## 2022-05-15 MED ORDER — TAMSULOSIN HCL 0.4 MG PO CAPS
0.4000 mg | ORAL_CAPSULE | Freq: Every day | ORAL | Status: DC
  Administered 2022-05-17 – 2022-05-29 (×13): 0.4 mg via ORAL
  Filled 2022-05-15 (×15): qty 1

## 2022-05-15 MED ORDER — ONDANSETRON HCL 4 MG/2ML IJ SOLN
4.0000 mg | Freq: Four times a day (QID) | INTRAMUSCULAR | Status: DC | PRN
  Administered 2022-05-21 – 2022-05-24 (×2): 4 mg via INTRAVENOUS
  Filled 2022-05-15 (×3): qty 2

## 2022-05-15 MED ORDER — SODIUM CHLORIDE 0.9 % IV SOLN
1.0000 g | INTRAVENOUS | Status: DC
  Administered 2022-05-16: 1 g via INTRAVENOUS
  Filled 2022-05-15 (×2): qty 10

## 2022-05-15 MED ORDER — FINASTERIDE 5 MG PO TABS
5.0000 mg | ORAL_TABLET | Freq: Every day | ORAL | Status: DC
  Administered 2022-05-17 – 2022-05-29 (×13): 5 mg via ORAL
  Filled 2022-05-15 (×15): qty 1

## 2022-05-15 MED ORDER — SODIUM CHLORIDE 0.9 % IV SOLN
1.0000 g | Freq: Once | INTRAVENOUS | Status: AC
  Administered 2022-05-15: 1 g via INTRAVENOUS
  Filled 2022-05-15: qty 10

## 2022-05-15 MED ORDER — FUROSEMIDE 10 MG/ML IJ SOLN
80.0000 mg | Freq: Once | INTRAMUSCULAR | Status: AC
  Administered 2022-05-15: 80 mg via INTRAVENOUS
  Filled 2022-05-15: qty 8

## 2022-05-15 MED ORDER — SODIUM CHLORIDE 0.9 % IV SOLN
200.0000 mg | Freq: Once | INTRAVENOUS | Status: AC
  Administered 2022-05-15: 200 mg via INTRAVENOUS
  Filled 2022-05-15: qty 40

## 2022-05-15 MED ORDER — ENOXAPARIN SODIUM 40 MG/0.4ML IJ SOSY
40.0000 mg | PREFILLED_SYRINGE | INTRAMUSCULAR | Status: DC
  Administered 2022-05-15 – 2022-05-19 (×5): 40 mg via SUBCUTANEOUS
  Filled 2022-05-15 (×5): qty 0.4

## 2022-05-15 MED ORDER — SODIUM CHLORIDE 0.9 % IV SOLN
100.0000 mg | Freq: Every day | INTRAVENOUS | Status: AC
  Administered 2022-05-16 – 2022-05-17 (×2): 100 mg via INTRAVENOUS
  Filled 2022-05-15 (×2): qty 20

## 2022-05-15 MED ORDER — ORAL CARE MOUTH RINSE
15.0000 mL | OROMUCOSAL | Status: DC
  Administered 2022-05-15 – 2022-05-17 (×8): 15 mL via OROMUCOSAL

## 2022-05-15 MED ORDER — FUROSEMIDE 10 MG/ML IJ SOLN
40.0000 mg | Freq: Every day | INTRAMUSCULAR | Status: DC
  Administered 2022-05-16 – 2022-05-22 (×7): 40 mg via INTRAVENOUS
  Filled 2022-05-15 (×8): qty 4

## 2022-05-15 MED ORDER — ACETAMINOPHEN 325 MG PO TABS
650.0000 mg | ORAL_TABLET | Freq: Four times a day (QID) | ORAL | Status: DC | PRN
  Administered 2022-05-19 – 2022-05-24 (×6): 650 mg via ORAL
  Filled 2022-05-15 (×7): qty 2

## 2022-05-15 MED ORDER — FAMOTIDINE IN NACL 20-0.9 MG/50ML-% IV SOLN
20.0000 mg | Freq: Two times a day (BID) | INTRAVENOUS | Status: DC
  Administered 2022-05-15 – 2022-05-19 (×8): 20 mg via INTRAVENOUS
  Filled 2022-05-15 (×8): qty 50

## 2022-05-15 NOTE — H&P (Signed)
History and Physical    Daniel Sharp YQI:[id number] DOB: 07-07-1933 DOA: 06/02/2022  PCP: [name], MD  Patient coming from: SNF  I have personally briefly reviewed patient's old medical records in [place]  Chief Complaint: Shortness of breath and altered mental status  HPI: Daniel Sharp is a 88 y.o. male with medical history significant of hypertension, paroxysmal atrial fibrillation, chronic hypercapnic respiratory failure, BiPAP use at night, history of mitral valve repair, myasthenia gravis and many other comorbidities was brought into the emergency department from nursing home due to worsening shortness of breath and altered mental status.  Reportedly patient was tested positive for COVID-pneumonia 2 days ago on Monday.  He was not started on any medications.  Patient himself is on BiPAP since he arrived to the emergency department.  Unable to provide me any history.  History is obtained from the ED physician as well as from the son.  ED Course: Upon arrival to ED, he was hypoxic and hypercapnic, started on BiPAP.  BNP doubled from 4 weeks ago.  He received Rocephin as well as Lasix in the ED.  Initially PCCM was called for admission.  They evaluated the patient and had a discussion with the son and son decided to choose DNR for the patient and thus admission was deferred to hospitalist service.  PCCM will continue to follow.  Review of Systems: As per HPI otherwise negative.    Past Medical History:  Diagnosis Date   A-fib [facility])    history of   Arthritis    Dyspnea    H/O mitral valve repair 07/2003   Hypertension    Ocular myasthenia gravis ([place])    takes pred   Pneumonia     Past Surgical History:  Procedure Laterality Date   APPENDECTOMY     BACK SURGERY     cyst removed L3 and L4   BIOPSY  04/19/2022   Procedure: BIOPSY;  Surgeon: Irving Copas., MD;  Location: [facility];  Service: Gastroenterology;;   CHOLECYSTECTOMY     COLONOSCOPY WITH  PROPOFOL N/A 04/19/2022   Procedure: COLONOSCOPY WITH PROPOFOL;  Surgeon: Irving Copas., MD;  Location: [facility];  Service: Gastroenterology;  Laterality: N/A;   ENDOSCOPIC MUCOSAL RESECTION  04/19/2022   Procedure: ENDOSCOPIC MUCOSAL RESECTION;  Surgeon: Rush Landmark Telford Nab., MD;  Location: [facility];  Service: Gastroenterology;;   ESOPHAGOGASTRODUODENOSCOPY (EGD) WITH PROPOFOL N/A 04/19/2022   Procedure: ESOPHAGOGASTRODUODENOSCOPY (EGD) WITH PROPOFOL;  Surgeon: Irving Copas., MD;  Location: [facility];  Service: Gastroenterology;  Laterality: N/A;   FINGER SURGERY Right    R hand   HEMOSTASIS CLIP PLACEMENT  04/19/2022   Procedure: HEMOSTASIS CLIP PLACEMENT;  Surgeon: Irving Copas., MD;  [facility];  Service: Gastroenterology;;   MITRAL VALVE REPAIR     on coumadin   NOSE SURGERY     POLYPECTOMY  04/19/2022   Procedure: POLYPECTOMY;  Surgeon: Rush Landmark Telford Nab., MD;  Location: [facility];  Service: Gastroenterology;;   ROBOTIC ASSITED PARTIAL NEPHRECTOMY Left 01/22/2022   Procedure: XI ROBOTIC ASSITED NEPHRECTOMY;  Surgeon: Ceasar Mons, MD;  [facility];  Service: Urology;  Laterality: Left;   SUBMUCOSAL LIFTING INJECTION  04/19/2022   Procedure: SUBMUCOSAL LIFTING INJECTION;  Surgeon: Rush Landmark Telford Nab., MD;  Location: [facility];  Service: Gastroenterology;;   SUBMUCOSAL TATTOO INJECTION  04/19/2022   Procedure: SUBMUCOSAL TATTOO INJECTION;  Surgeon: Irving Copas., MD;  [facility];  Service: Gastroenterology;;     reports  that he has quit smoking. His smoking use included cigarettes, pipe, and cigars. He has quit using smokeless tobacco.  His smokeless tobacco use included chew. He reports current alcohol use of about 7.0 standard drinks of alcohol per week. He reports that he does not use drugs.  Allergies  Allergen Reactions   Aminoglycosides     Hx of MG    Botox  [Onabotulinumtoxina]     Hx of MG    Corticosteroids     Caution with high doses as Hx of MG    Hydroxychloroquine     Hx of MG    Levaquin [Levofloxacin]     Hx of MG  - avoid all Fluoroquinolones   Macrolides And Ketolides     Hx of MG    Magnesium-Containing Compounds     Use with caution as pt with Hx of MG    Neuromuscular Blocking Agents     Hx of MG    Penicillamine     Hx of MG    Procainamide     Hx of MG    Quinidine     Hx of MG     Family History  Problem Relation Age of Onset   Other Mother        Gallbladder removed   Stroke Father        intracranial stroke   Stroke Brother    Colon cancer Neg Hx    Rectal cancer Neg Hx     Prior to Admission medications   Medication Sig Start Date End Date Taking? Authorizing Provider  carboxymethylcellulose (REFRESH PLUS) 0.5 % SOLN 1 drop daily as needed (dry eyes).    [provider]  Cholecalciferol (VITAMIN D3 PO) Take 1 tablet by mouth daily.    [provider]  docusate sodium (COLACE) 100 MG capsule Take 1 capsule (100 mg total) by mouth 2 (two) times daily. Patient taking differently: Take 100 mg by mouth daily. 01/22/22   Debbrah Alar, PA-C  feeding supplement (ENSURE ENLIVE / ENSURE PLUS) LIQD Take 237 mLs by mouth daily. 05/01/22   Hosie Poisson, MD  ferrous sulfate 325 (65 FE) MG tablet Take 325 mg by mouth daily with breakfast.    [provider]  finasteride (PROSCAR) 5 MG tablet Take 1 tablet (5 mg total) by mouth daily. 05/01/22   Hosie Poisson, MD  furosemide (LASIX) 20 MG tablet Take 20 mg by mouth daily.    [provider]  glucosamine-chondroitin 500-400 MG tablet Take 1 tablet by mouth daily.    [provider]  linezolid (ZYVOX) 600 MG tablet Take 600 mg by mouth 2 (two) times daily. 2 times daily for 10 days    [provider]  pantoprazole (PROTONIX) 40 MG tablet Take 1 tablet (40 mg total) by mouth 2 (two) times daily. 04/30/22   Hosie Poisson,  MD  predniSONE (DELTASONE) 5 MG tablet Take 2 tablets (10 mg total) by mouth daily. Patient taking differently: Take 10 mg by mouth daily. Taking 2 tabs by mouth daily 02/11/22   Narda Amber K, DO  pyridostigmine (MESTINON) 60 MG tablet Take 1 tablet (60 mg total) by mouth every 8 (eight) hours. 02/19/22   Shelly Coss, MD  tamsulosin (FLOMAX) 0.4 MG CAPS capsule Take 0.4 mg by mouth daily.    [provider]    Physical Exam: Vitals:   05/11/2022 1648 06/04/2022 1700 05/28/2022 1715 05/17/2022 1736  BP: 108/62  107/88 107/88  Pulse: 62  70  67  Resp: (!) 26 (!) 23 (!) 22 20  Temp:      TempSrc:      SpO2: 100%  100%   Height:        Constitutional: NAD, calm, comfortable Vitals:   05/28/2022 1648 05/10/2022 1700 06/03/2022 1715 05/18/2022 1736  BP: 108/62  107/88 107/88  Pulse: 62  70 67  Resp: (!) 26 (!) 23 (!) 22 20  Temp:      TempSrc:      SpO2: 100%  100%   Height:       Eyes: PERRL, lids and conjunctivae normal ENMT: Mucous membranes are moist. Posterior pharynx clear of any exudate or lesions.Normal dentition.  Neck: normal, supple, no masses, no thyromegaly Respiratory: clear to auscultation bilaterally, no wheezing, no crackles. Normal respiratory effort. No accessory muscle use.  Cardiovascular: Regular rate and rhythm, no murmurs / rubs / gallops. No extremity edema. 2+ pedal pulses. No carotid bruits.  Abdomen: no tenderness, no masses palpated. No hepatosplenomegaly. Bowel sounds positive.  Musculoskeletal: no clubbing / cyanosis. No joint deformity upper and lower extremities. Good ROM, no contractures. Normal muscle tone.  Skin: no rashes, lesions, ulcers. No induration Neurologic: CN 2-12 grossly intact. Sensation intact, DTR normal. Strength 5/5 in all 4.  Psychiatric: Normal judgment and insight. Alert and oriented x 3. Normal mood.    Labs on Admission: I have personally reviewed following labs and imaging studies  CBC: Recent Labs  Lab 05/23/2022 1415  WBC  10.9*  NEUTROABS 9.9*  HGB 9.9*  HCT 34.7*  MCV 103.0*  PLT 010   Basic Metabolic Panel: Recent Labs  Lab 05/20/2022 1415  NA 145  K 5.3*  CL 107  CO2 32  GLUCOSE 111*  BUN 56*  CREATININE 1.21  CALCIUM 10.8*   GFR: CrCl cannot be calculated (Unknown ideal weight.). Liver Function Tests: Recent Labs  Lab 05/14/2022 1415  AST 12*  ALT 15  ALKPHOS 97  BILITOT 0.8  PROT 7.0  ALBUMIN 2.9*   No results for input(s): "LIPASE", "AMYLASE" in the last 168 hours. No results for input(s): "AMMONIA" in the last 168 hours. Coagulation Profile: No results for input(s): "INR", "PROTIME" in the last 168 hours. Cardiac Enzymes: No results for input(s): "CKTOTAL", "CKMB", "CKMBINDEX", "TROPONINI" in the last 168 hours. BNP (last 3 results) No results for input(s): "PROBNP" in the last 8760 hours. HbA1C: No results for input(s): "HGBA1C" in the last 72 hours. CBG: No results for input(s): "GLUCAP" in the last 168 hours. Lipid Profile: No results for input(s): "CHOL", "HDL", "LDLCALC", "TRIG", "CHOLHDL", "LDLDIRECT" in the last 72 hours. Thyroid Function Tests: No results for input(s): "TSH", "T4TOTAL", "FREET4", "T3FREE", "THYROIDAB" in the last 72 hours. Anemia Panel: No results for input(s): "VITAMINB12", "FOLATE", "FERRITIN", "TIBC", "IRON", "RETICCTPCT" in the last 72 hours. Urine analysis:    Component Value Date/Time   COLORURINE YELLOW 06/04/2022 1553   APPEARANCEUR CLEAR 05/25/2022 1553   LABSPEC 1.013 05/10/2022 1553   PHURINE 5.0 05/23/2022 1553   GLUCOSEU NEGATIVE 05/08/2022 1553   HGBUR NEGATIVE 06/02/2022 1553   BILIRUBINUR NEGATIVE 06/04/2022 1553   KETONESUR NEGATIVE 05/22/2022 1553   PROTEINUR NEGATIVE 06/04/2022 1553   NITRITE NEGATIVE 05/12/2022 1553   LEUKOCYTESUR NEGATIVE 05/18/2022 1553    Radiological Exams on Admission: DG Chest Portable 1 View  Result Date: 05/10/2022 CLINICAL DATA:  Recent diagnosis of COVID pneumonia 2 days ago. Cough. EXAM:  PORTABLE CHEST 1 VIEW COMPARISON:  04/24/2022 FINDINGS: Previous median sternotomy for mitral  valve repair and CABG. Stable cardiomediastinal contours. Small right pleural effusion is suspected. Diffuse hazy opacities are noted throughout both lungs. More focal airspace consolidation within the right midlung and right lower lung is identified which appears increased compared with the exam from 04/17/2022. IMPRESSION: 1. Diffuse hazy opacities throughout both lungs compatible with more focal airspace opacification within the right midlung and right lower lung compatible with pneumonia. 2. Suspect small right pleural effusion. Electronically Signed   By: Kerby Moors M.D.   On: 05/31/2022 14:22     Assessment/Plan Principal Problem:   COVID-19 Active Problems:   Acute metabolic encephalopathy   Acute on chronic respiratory failure with hypoxia and hypercapnia (HCC)   Acute on chronic respiratory failure with hypoxia and hypercapnia secondary to COVID-pneumonia as well as possible superimposed bacterial pneumonia/acute on chronic diastolic congestive heart failure: Patient does not meet sepsis cardiac sepsis ruled out.  Patient seems to have combination of COVID-pneumonia, bacterial pneumonia and acute on chronic diastolic congestive heart failure due to elevated BNP.  He received 1 dose of Lasix in the ED.  He has been evaluated by PCCM, they recommend continuing Lasix.  I will also start him on remdesivir.  We will check CRP, ferritin, procalcitonin as well as D-dimer.  Son has elected for the patient to be DNR.  He understands that patient is at high risk of worsening and possibly dying due to his history of myasthenia gravis.  I personally confirmed all of this with the patient's son as well.  I will also continue Rocephin.  BPH: Resume Flomax.  History of paroxysmal atrial fibrillation: Patient not on any rate control medications or any anticoagulation, EKG today shows accelerated junctional  rhythm.  Monitor.  Hyperkalemia: 5.3.  Hopefully this will resolve with Lasix.  CKD stage IIIa: At baseline.  DVT prophylaxis: enoxaparin (LOVENOX) injection 40 mg Start: 05/10/2022 2200 Code Status: DNR Family Communication: Son present at bedside.  Plan of care discussed with patient in length and he verbalized understanding and agreed with it. Disposition Plan: Unknown Consults called: PCCM  Darliss Cheney MD [facility]  *Please note that this is a verbal dictation therefore any spelling or grammatical errors are due to the "[place] [employer]" system interpretation.  Please page via [employer] and do not message via secure chat for urgent patient care matters. Secure chat can be used for non urgent patient care matters. 05/14/2022, 5:45 PM  To contact the attending provider between 7A-7P or the covering provider during after hours 7P-7A, please log into the web site [email or web address]

## 2022-05-15 NOTE — Consult Note (Signed)
NAME:  Daniel Sharp, MRN:  [id number], DOB:  Nov 12, 1932, LOS: 0 ADMISSION DATE:  05/25/2022, CONSULTATION DATE:  06/04/2022 REFERRING MD:  Dr. Laverta Baltimore ED, CHIEF COMPLAINT:  AMS   History of Present Illness:  88 year old male myasthenia gravis on chronic steroids presents with altered mental status.  Resides at skilled nursing facility.  Chest positive COVID with the last 48 to 72 hours.  Was doing okay yesterday.  Relatively unresponsive today.  Prompted ED evaluation.  Chest x-ray reveals diffuse hazy opacities, edema versus atypical infection versus viral infection on my review and interpretation.  Labs showed creatinine at relative baseline.  Mild elevation of potassium.  CBC okay.  BNP doubled from most recent value, over 600 at this time.  He was placed on BiPAP.  Unclear exactly why.  VBG obtained prior to BiPAP indicates a pH of 7.28 with PCO2 of 77.  Well compensated.  Pertinent  Medical History  Myasthenia gravis, chronic hypercarbic respiratory failure, BiPAP use at night  Significant Hospital Events: Including procedures, antibiotic start and stop dates in addition to other pertinent events   11/8 admitted from skilled nursing facility with altered mental status with recent COVID-positive test there  Interim History / Subjective:  As above  Objective   Blood pressure 108/62, pulse 62, temperature (!) 97.4 F (36.3 C), temperature source Oral, resp. rate (!) 23, height '6\' 1"'$  (1.854 m), SpO2 100 %.       No intake or output data in the 24 hours ending 05/28/2022 1717 There were no vitals filed for this visit.  Examination: General: Chronically ill-appearing, on BiPAP HENT: Atraumatic, normocephalic, no icterus Lungs: Diffuse crackles, coarse BiPAP sounds Cardiovascular: Warm, 2+ edema to thigh Abdomen: Nondistended, bowel sounds present Neuro: Arouses to voice when son speaks, otherwise relatively obtunded   Resolved Hospital Problem list   N/a  Assessment & Plan:  Toxic  Metabolic Encephalopathy: Suspect multifactorial, related to COVID encephalopathy, delirium.  While PCO2 is elevated in 70s, he is compensated with a normal pH.  I do not feel carbon dioxide is the primary driver of his encephalopathy. -- Supportive care, avoid centrally acting medications  Acute on chronic hypercarbic respiratory failure: PCO2 mildly elevated from baseline likely in the setting of volume overload.  Well compensated with pH that likely corrects arterial greater than 7.3. -- Okay for BiPAP although this is not for CO2 retention but for work of breathing, I did not see him prior to BiPAP administration so not sure how pertinent it is to continue this --  switch to AVAPS mode to increase minute ventilation of off more CO2, changes made in the room by me -- Would decrease FiO2 to target sat 88% or greater in effort to avoid hyperoxygenation leading to decreased respiratory drive and encephalopathy -- Continue ceftriaxone, plan 5 days for possible bacterial pneumonia although I feel this is less likely at this time  Pulmonary edema: BNP elevated, bilateral infiltrates.  Most likely reflective of edema given diffuse lower extremity swelling. -- Lasix in the milligrams IV x1, assess response  COVID-19 infection: Felt to be less of a contributor to current pulmonary issues.  Possibly contributing to encephalopathy. -- Consider addition of steroids  Goals of care, discussed with son at bedside.  Along with Montey Hora, PA.  We discussed that given his chronic comorbid conditions including neuromuscular weakness from myasthenia gravis as well as recent clinical decompensation with hospitalization that he is likely to need prolonged life support where he to worsen.  Son  clearly states he would not want prolonged life support.  Shared decision was made to make the patient DNR/DNI.  We will aggressively treat him at this time, we will draw the line for  additional treatment at no life  support.  PCCM will continue to follow  Best Practice (right click and "Reselect all SmartList Selections" daily)   Per Primary  Labs   CBC: Recent Labs  Lab 05/10/2022 1415  WBC 10.9*  NEUTROABS 9.9*  HGB 9.9*  HCT 34.7*  MCV 103.0*  PLT 956    Basic Metabolic Panel: Recent Labs  Lab 05/28/2022 1415  NA 145  K 5.3*  CL 107  CO2 32  GLUCOSE 111*  BUN 56*  CREATININE 1.21  CALCIUM 10.8*   GFR: CrCl cannot be calculated (Unknown ideal weight.). Recent Labs  Lab 05/27/2022 1415 05/10/2022 1545  WBC 10.9*  --   LATICACIDVEN 1.0 1.1    Liver Function Tests: Recent Labs  Lab 05/31/2022 1415  AST 12*  ALT 15  ALKPHOS 97  BILITOT 0.8  PROT 7.0  ALBUMIN 2.9*   No results for input(s): "LIPASE", "AMYLASE" in the last 168 hours. No results for input(s): "AMMONIA" in the last 168 hours.  ABG    Component Value Date/Time   PHART 7.44 04/19/2022 0440   PCO2ART 57 (H) 04/19/2022 0440   PO2ART 108 04/19/2022 0440   HCO3 36.2 (H) 06/02/2022 1430   TCO2 30 01/27/2022 1439   O2SAT 95 05/20/2022 1430     Coagulation Profile: No results for input(s): "INR", "PROTIME" in the last 168 hours.  Cardiac Enzymes: No results for input(s): "CKTOTAL", "CKMB", "CKMBINDEX", "TROPONINI" in the last 168 hours.  HbA1C: No results found for: "HGBA1C"  CBG: No results for input(s): "GLUCAP" in the last 168 hours.  Review of Systems:   unobtainable due to encephalopathy   Past Medical History:  He,  has a past medical history of A-fib ([place]), Arthritis, Dyspnea, H/O mitral valve repair (07/2003), Hypertension, Ocular myasthenia gravis ([place]), and Pneumonia.   Surgical History:   Past Surgical History:  Procedure Laterality Date   APPENDECTOMY     BACK SURGERY     cyst removed L3 and L4   BIOPSY  04/19/2022   Procedure: BIOPSY;  Surgeon: Rush Landmark Telford Nab., MD;  Location: [facility];  Service: Gastroenterology;;   CHOLECYSTECTOMY     COLONOSCOPY WITH PROPOFOL N/A  04/19/2022   Procedure: COLONOSCOPY WITH PROPOFOL;  Surgeon: Irving Copas., MD;  Location: [facility];  Service: Gastroenterology;  Laterality: N/A;   ENDOSCOPIC MUCOSAL RESECTION  04/19/2022   Procedure: ENDOSCOPIC MUCOSAL RESECTION;  Surgeon: Rush Landmark Telford Nab., MD;  Location: [facility];  Service: Gastroenterology;;   ESOPHAGOGASTRODUODENOSCOPY (EGD) WITH PROPOFOL N/A 04/19/2022   Procedure: ESOPHAGOGASTRODUODENOSCOPY (EGD) WITH PROPOFOL;  Surgeon: Irving Copas., MD;  Location: [facility];  Service: Gastroenterology;  Laterality: N/A;   FINGER SURGERY Right    R hand   HEMOSTASIS CLIP PLACEMENT  04/19/2022   Procedure: HEMOSTASIS CLIP PLACEMENT;  Surgeon: Irving Copas., MD;  Location: [facility];  Service: Gastroenterology;;   MITRAL VALVE REPAIR     on coumadin   NOSE SURGERY     POLYPECTOMY  04/19/2022   Procedure: POLYPECTOMY;  Surgeon: Rush Landmark Telford Nab., MD;  Location: [facility];  Service: Gastroenterology;;   ROBOTIC ASSITED PARTIAL NEPHRECTOMY Left 01/22/2022   Procedure: XI ROBOTIC ASSITED NEPHRECTOMY;  Surgeon: Ceasar Mons, MD;  Location: [facility];  Service: Urology;  Laterality: Left;  SUBMUCOSAL LIFTING INJECTION  04/19/2022   Procedure: SUBMUCOSAL LIFTING INJECTION;  Surgeon: Rush Landmark Telford Nab., MD;  Location: [facility];  Service: Gastroenterology;;   SUBMUCOSAL TATTOO INJECTION  04/19/2022   Procedure: SUBMUCOSAL TATTOO INJECTION;  Surgeon: Irving Copas., MD;  Location: [facility];  Service: Gastroenterology;;     Social History:   reports that he has quit smoking. His smoking use included cigarettes, pipe, and cigars. He has quit using smokeless tobacco.  His smokeless tobacco use included chew. He reports current alcohol use of about 7.0 standard drinks of alcohol per week. He reports that he does not use drugs.   Family History:  His family history includes Other in his mother; Stroke in  his brother and father. There is no history of Colon cancer or Rectal cancer.   Allergies Allergies  Allergen Reactions   Aminoglycosides     Hx of MG    Botox [Onabotulinumtoxina]     Hx of MG    Corticosteroids     Caution with high doses as Hx of MG    Hydroxychloroquine     Hx of MG    Levaquin [Levofloxacin]     Hx of MG  - avoid all Fluoroquinolones   Macrolides And Ketolides     Hx of MG    Magnesium-Containing Compounds     Use with caution as pt with Hx of MG    Neuromuscular Blocking Agents     Hx of MG    Penicillamine     Hx of MG    Procainamide     Hx of MG    Quinidine     Hx of MG      Home Medications  Prior to Admission medications   Medication Sig Start Date End Date Taking? Authorizing Provider  carboxymethylcellulose (REFRESH PLUS) 0.5 % SOLN 1 drop daily as needed (dry eyes).    [provider]  Cholecalciferol (VITAMIN D3 PO) Take 1 tablet by mouth daily.    [provider]  docusate sodium (COLACE) 100 MG capsule Take 1 capsule (100 mg total) by mouth 2 (two) times daily. Patient taking differently: Take 100 mg by mouth daily. 01/22/22   Debbrah Alar, PA-C  feeding supplement (ENSURE ENLIVE / ENSURE PLUS) LIQD Take 237 mLs by mouth daily. 05/01/22   Hosie Poisson, MD  ferrous sulfate 325 (65 FE) MG tablet Take 325 mg by mouth daily with breakfast.    [provider]  finasteride (PROSCAR) 5 MG tablet Take 1 tablet (5 mg total) by mouth daily. 05/01/22   Hosie Poisson, MD  furosemide (LASIX) 20 MG tablet Take 20 mg by mouth daily.    [provider]  glucosamine-chondroitin 500-400 MG tablet Take 1 tablet by mouth daily.    [provider]  linezolid (ZYVOX) 600 MG tablet Take 600 mg by mouth 2 (two) times daily. 2 times daily for 10 days    [provider]  pantoprazole (PROTONIX) 40 MG tablet Take 1 tablet (40 mg total) by mouth 2 (two) times daily. 04/30/22   Hosie Poisson, MD  predniSONE  (DELTASONE) 5 MG tablet Take 2 tablets (10 mg total) by mouth daily. Patient taking differently: Take 10 mg by mouth daily. Taking 2 tabs by mouth daily 02/11/22   Narda Amber K, DO  pyridostigmine (MESTINON) 60 MG tablet Take 1 tablet (60 mg total) by mouth every 8 (eight) hours. 02/19/22   Shelly Coss, MD  tamsulosin (FLOMAX) 0.4 MG CAPS capsule Take  0.4 mg by mouth daily.    [provider]     Critical care time:     CRITICAL CARE Performed by: Lanier Clam   Total critical care time: 40 minutes  Critical care time was exclusive of separately billable procedures and treating other patients.  Critical care was necessary to treat or prevent imminent or life-threatening deterioration.  Critical care was time spent personally by me on the following activities: development of treatment plan with patient and/or surrogate as well as nursing, discussions with consultants, evaluation of patient's response to treatment, examination of patient, obtaining history from patient or surrogate, ordering and performing treatments and interventions, ordering and review of laboratory studies, ordering and review of radiographic studies, pulse oximetry and re-evaluation of patient's condition.  Lanier Clam, MD See Shea Evans for contact info

## 2022-05-15 NOTE — Progress Notes (Signed)
[facility] [employer] [facility]) Hospital Liaison note:  This patient is currently enrolled in [facility] outpatient-based Palliative Care. Will continue to follow for disposition.  Please call with any outpatient palliative questions or concerns.  Thank you, Lorelee Market, LPN [facility] Liaison [phone]

## 2022-05-15 NOTE — ED Triage Notes (Signed)
BIB [facility] from [facility]. Was diagnosed with COVID Pneumonia two days ago. No new complaints or issues. Per EMS, the rounding MD at facility wanted patient sent to ER due to COVID Pneumonia. Patient is alert and talkative. He is aware that he is at the ER . States he has had a cough, but denies shortness of breath. Complains of a pain in upper back/neck area. Unable to rate.     EMS : 118/76 100% 2 liters nasal cannula 16 84 CBS 115

## 2022-05-15 NOTE — Progress Notes (Signed)
[facility] Care Consult Note Telephone: [phone]  Fax: [phone]   Date of encounter: 05/31/2022 8:57 AM PATIENT NAME: Daniel Sharp [address] [place] [address]   [phone] (home)  DOB: [birth year] MRN: [id number] PRIMARY CARE PROVIDER:    [name], MD,  [place] [address] [phone]  REFERRING PROVIDER:   Nilda Simmer, NP  RESPONSIBLE PARTY:    Contact Information     Name Relation Home Work [place] Son   [phone]   Daniel Sharp   [phone]   Daniel Sharp   [phone]   Daniel Sharp Niece   [phone]        I met face to face with patient and family in [place] rehab facility. Palliative Care was asked to follow this patient by consultation request of  Daniel Simmer, NP to address advance care planning and complex medical decision making. This is the initial visit.                                     ASSESSMENT AND PLAN / RECOMMENDATIONS:   Advance Care Planning/Goals of Care: Goals include to maximize quality of life and symptom management. Patient/health care surrogate gave his/her permission to discuss.Our advance care planning conversation included a discussion about:    The value and importance of advance care planning  Experiences with loved ones who have been seriously ill or have died  Exploration of personal, cultural or spiritual beliefs that might influence medical decisions  Exploration of goals of care in the event of a sudden injury or illness  Identification  of a healthcare agent  Review and updating or creation of an  advance directive document . Decision not to resuscitate or to de-escalate disease focused treatments due to poor prognosis. CODE STATUS:  DNR, but requesting temporary use of ventilator if needed Met with his son and DIL for 2 plus hours today.  Discussed overall condition including his rectal  cancer not amenable to surgery, chronic diastolic heart failure, hypercapnea for which bipap was used in hospital due to suspected OSA, afib, CKD3a, renal cell s/p left nephrectomy, and anemia all contributing to poor prognosis of estimated less than 6 months (son requested to know my impression).  Pt had made some progress with therapy since admission and was able to ambulate with walker short distances though intake remained poor, he was weak and fatigued.  He then developed covid and became considerably delirious, weak, and had developed pneumonia.  Primary NP came in amid visit and immediately recommended sending him out to the hospital due to his acute decline so he could be treated for his covid, given fluids and potentially put back on bipap.  Family agreed with this request and pt was sent back.  I then met further with son and DIL and explained suspected trajectory based on clinical geriatric and palliative experience and options for further palliative and hospice care including hospice at home or in facility (and how that works financially).  Also discussed need for additional in home care with caregivers should he return home with hospice.   Overall conclusion was go back to hospital then hopefully return to [facility] for a little rehab to improve and see where he is then.   Notified hospital liaison team of hospital transfer so he can be followed there.    Symptom Management/Plan: 1. Rectal adenocarcinoma (  [place]) -has mass in rectum for which 3 different surgical options were offered; however, I don't foresee Daniel Sharp tolerating any of these in view of his comorbidities--explained this to family who did express understanding though they plan to f/u with [facility] specialist  2. Renal cell carcinoma of left kidney (HCC) -s/p nephrectomy, reportedly no metastatic disease related to this   3. H/O left nephrectomy -for cancer so now had CKD3a with just the one kidney at advanced age; has anemia  4.  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 Acute on chronic diastolic CHF (congestive heart failure) (HCC) -appears hypervolemic after received IVFs yesterday due to covid infection--3+ pitting edema of feet (though he's been in bed), rales present at bases, sats wnl -primary team sending back to hospital upon discussion with family  6. Pneumonia due to COVID-19 virus -per CXR obtained in facility, being sent back to hospital  7. Acute on chronic respiratory failure with hypercapnia (HCC) -some suspicion of this as cause for his hypoactive delirium though multifactorial in view of covid, chf, underlying malignancy, advanced age -has not had bipap at facility though used in hospital -son was purchasing one to have when he leaves hospital  8. Acute hypoactive delirium due to multiple etiologies -as above, indicative of poor overall prognosis, discussed with family, they requested hospital transfer (see section about ACP above)  9. Palliative care encounter -hospital liaison to follow at hospital, then NP will f/u if he returns to a SNF or RN/SW at home as he is eligible to be followed by palliative, also would be eligible for hospice if he and family decide they are ready for this    This visit was coded based on medical decision making (MDM).90 mins spent on ACP.  PPS: 30% at the time of this visit  HOSPICE ELIGIBILITY/DIAGNOSIS: yes/rectal cell ca or chf or covid potentially  Chief Complaint: Initial palliative care consult at [facility]:  Daniel Sharp is a 88 y.o. year old male  with a long and complicated history of recent medical decline and complications.  See a/p section for details.   History obtained from review of EMR, discussion with primary team, and interview with family, facility staff/caregiver and/or Daniel Sharp.   I reviewed available labs, medications, imaging, studies and related documents from the EMR.   Records reviewed and summarized above.   ROS  Review of Systemssee a/p  Physical Exam:  Wt Readings from Last 500 Encounters:  04/29/22 189 lb 9.5 oz (86 kg)  04/04/22 196 lb 8 oz (89.1 kg)  04/03/22 194 lb 1.3 oz (88 kg)  03/27/22 195 lb 8 oz (88.7 kg)  03/04/22 192 lb (87.1 kg)  02/13/22 190 lb 14.7 oz (86.6 kg)  02/11/22 198 lb (89.8 kg)  01/30/22 231 lb (104.8 kg)  01/27/22 225 lb (102.1 kg)  01/23/22 219 lb 11 oz (99.6 kg)  01/09/22 227 lb 1.2 oz (103 kg)  01/04/22 229 lb (103.9 kg)  11/21/21 225 lb 3.2 oz (102.2 kg)  08/15/21 221 lb 2 oz (100.3 kg)  08/01/21 216 lb (98 kg)  07/05/21 225 lb (102.1 kg)   Physical Exam Constitutional:      Comments: Will open eyes for a few seconds then close them, speaks a few words with garbled speech, much of it not making sense  HENT:     Head: Normocephalic and atraumatic.  Cardiovascular:     Rate and Rhythm: Rhythm irregular.  Pulmonary:  Comments: Coarse rhonchi, rales at bases, sats mid 90s on 2L Abdominal:     General: Bowel sounds are normal.     Palpations: Abdomen is soft.     Tenderness: There is no abdominal tenderness.  Musculoskeletal:        General: Normal range of motion.     Right lower leg: Edema present.     Left lower leg: Edema present.     Comments: 3+ pitting edema bilaterally  Neurological:     Motor: Weakness present.     Comments: Not responsive enough to assess orientation  Psychiatric:        Mood and Affect: Mood normal.     CURRENT PROBLEM LIST:  Patient Active Problem List   Diagnosis Date Noted   Bacteremia 04/29/2022   Rectal adenocarcinoma ([place]) 04/23/2022   Tubular adenoma of colon 04/23/2022   Rectal mass 04/20/2022   Acute respiratory failure with hypoxia and hypercapnia (HCC) 57/32/2025   Acute metabolic encephalopathy 42/70/6237   BPH (benign prostatic hyperplasia) 04/17/2022   Acute on chronic diastolic CHF (congestive heart failure) ([place]) 04/17/2022   SAH (subarachnoid  hemorrhage) ([place]) 04/17/2022   Meningioma ([place]) 04/17/2022   NSVT (nonsustained ventricular tachycardia) ([place]) 04/17/2022   Chronic kidney disease, stage 3a ([place]) 04/17/2022   Abnormal CT scan, colon    Pressure injury of skin 04/15/2022   Microscopic hematuria 04/12/2022   GIB (gastrointestinal bleeding) 04/12/2022   Sepsis ([place]) 04/12/2022   Hemorrhagic cystitis 04/12/2022   Dehydration 04/12/2022   Acute prerenal azotemia 04/12/2022   Gross hematuria 04/11/2022   IDA (iron deficiency anemia) 04/03/2022   Elevated troponin 02/13/2022   H/O mitral valve repair 02/13/2022   Generalized weakness 02/13/2022   Hypercalcemia 02/13/2022   Hyperbilirubinemia 02/13/2022   Renal cell carcinoma of left kidney ([place]) 01/30/2022   Myasthenia gravis without acute exacerbation ([place]) 01/30/2022   Renal mass 01/22/2022   H/O mitral valve replacement 07/11/2021   Localized edema 07/11/2021   Overweight with body mass index (BMI) 25.0-29.9 11/23/2018   Persistent atrial fibrillation ([place]) 03/28/2012   Hypertension 03/09/2009   Hypercholesterolemia 03/09/2009   PAST MEDICAL HISTORY:  Active Ambulatory Problems    Diagnosis Date Noted   H/O mitral valve replacement 07/11/2021   Localized edema 07/11/2021   Renal mass 01/22/2022   Renal cell carcinoma of left kidney ([place]) 01/30/2022   Myasthenia gravis without acute exacerbation ([place]) 01/30/2022   Elevated troponin 02/13/2022   Hypertension 03/09/2009   Hypercholesterolemia 03/09/2009   H/O mitral valve repair 02/13/2022   Persistent atrial fibrillation ([place]) 03/28/2012   Overweight with body mass index (BMI) 25.0-29.9 11/23/2018   Generalized weakness 02/13/2022   Hypercalcemia 02/13/2022   Hyperbilirubinemia 02/13/2022   IDA (iron deficiency anemia) 04/03/2022   Gross hematuria 04/11/2022   Microscopic hematuria 04/12/2022   GIB (gastrointestinal bleeding) 04/12/2022   Sepsis ([place]) 04/12/2022   Hemorrhagic cystitis 04/12/2022    Dehydration 04/12/2022   Acute prerenal azotemia 04/12/2022   Pressure injury of skin 04/15/2022   Abnormal CT scan, colon    Acute respiratory failure with hypoxia and hypercapnia ([place]) 62/83/1517   Acute metabolic encephalopathy 61/60/7371   BPH (benign prostatic hyperplasia) 04/17/2022   Acute on chronic diastolic CHF (congestive heart failure) ([place]) 04/17/2022   SAH (subarachnoid hemorrhage) ([place]) 04/17/2022   Meningioma ([employer]) 04/17/2022   NSVT (nonsustained ventricular tachycardia) ([place]) 04/17/2022   Chronic kidney disease, stage 3a ([place]) 04/17/2022   Rectal mass 04/20/2022   Rectal adenocarcinoma ([place]) 04/23/2022   Tubular  adenoma of colon 04/23/2022   Bacteremia 04/29/2022   Resolved Ambulatory Problems    Diagnosis Date Noted   No Resolved Ambulatory Problems   Past Medical History:  Diagnosis Date   A-fib ([place])    Arthritis    Dyspnea    Ocular myasthenia gravis ([place])    Pneumonia    SOCIAL HX:  Social History   Tobacco Use   Smoking status: Former    Types: Cigarettes, Pipe, Cigars   Smokeless tobacco: Former    Types: Chew   Tobacco comments:    Uses nicotine gum   Substance Use Topics   Alcohol use: Yes    Alcohol/week: 7.0 standard drinks of alcohol    Types: 7 Glasses of wine per week    Comment: Drinks wine when sons will let him   FAMILY HX:  Family History  Problem Relation Age of Onset   Other Mother        Gallbladder removed   Stroke Father        intracranial stroke   Stroke Brother    Colon cancer Neg Hx    Rectal cancer Neg Hx       ALLERGIES:  Allergies  Allergen Reactions   Aminoglycosides     Hx of MG    Botox [Onabotulinumtoxina]     Hx of MG    Corticosteroids     Caution with high doses as Hx of MG    Hydroxychloroquine     Hx of MG    Levaquin [Levofloxacin]     Hx of MG  - avoid all Fluoroquinolones   Macrolides And Ketolides     Hx of MG    Magnesium-Containing Compounds     Use with caution as pt with Hx of MG     Neuromuscular Blocking Agents     Hx of MG    Penicillamine     Hx of MG    Procainamide     Hx of MG    Quinidine     Hx of MG       PERTINENT MEDICATIONS:  Outpatient Encounter Medications as of 05/10/2022  Medication Sig   carboxymethylcellulose (REFRESH PLUS) 0.5 % SOLN 1 drop daily as needed (dry eyes).   Cholecalciferol (VITAMIN D3 PO) Take 1 tablet by mouth daily.   docusate sodium (COLACE) 100 MG capsule Take 1 capsule (100 mg total) by mouth 2 (two) times daily. (Patient taking differently: Take 100 mg by mouth daily.)   feeding supplement (ENSURE ENLIVE / ENSURE PLUS) LIQD Take 237 mLs by mouth daily.   ferrous sulfate 325 (65 FE) MG tablet Take 325 mg by mouth daily with breakfast.   finasteride (PROSCAR) 5 MG tablet Take 1 tablet (5 mg total) by mouth daily.   furosemide (LASIX) 20 MG tablet Take 20 mg by mouth daily.   glucosamine-chondroitin 500-400 MG tablet Take 1 tablet by mouth daily.   linezolid (ZYVOX) 600 MG tablet Take 600 mg by mouth 2 (two) times daily. 2 times daily for 10 days   pantoprazole (PROTONIX) 40 MG tablet Take 1 tablet (40 mg total) by mouth 2 (two) times daily.   predniSONE (DELTASONE) 5 MG tablet Take 2 tablets (10 mg total) by mouth daily. (Patient taking differently: Take 10 mg by mouth daily. Taking 2 tabs by mouth daily)   pyridostigmine (MESTINON) 60 MG tablet Take 1 tablet (60 mg total) by mouth every 8 (eight) hours.   tamsulosin (FLOMAX) 0.4 MG CAPS capsule Take  0.4 mg by mouth daily.   No facility-administered encounter medications on file as of 05/11/2022.    Thank you for the opportunity to participate in the care of Daniel Sharp.  The palliative care team will continue to follow. Please call our office at [phone] if we can be of additional assistance.   Hollace Kinnier, DO  COVID-19 PATIENT SCREENING TOOL Asked and negative response unless otherwise noted:  Have you had symptoms of covid, tested positive or been in contact with  someone with symptoms/positive test in the past 5-10 days?  NO

## 2022-05-15 NOTE — ED Provider Notes (Signed)
[facility] DEPT Provider Note   CSN: [id number] Arrival date & time: 06/06/2022  1240     History  No chief complaint on file.   Daniel Sharp is a 88 y.o. male with a past medical history significant for myasthenia gravis, atrial fibrillation, MV repair, hypertension, left renal mass status post left nephrectomy, CKD stage III, BPH, and history of colorectal cancer who presents to the ED due to worsening shortness of breath and confusion.  Patient diagnosed with COVID on Monday.  Patient had a chest x-ray at his rehab center which demonstrated COVID-pneumonia.  Son at bedside provided history. Son notes patient has drastically declined over the past few days. Difficult to arousal patient. He notes patient has been more confused recently.  Chart reviewed.  Patient was recently admitted to the hospital on 10/5 to 10/24 due to respiratory failure secondary to hypercarbic respiratory failure.  Patient placed on BiPAP at that time with improvement in mentation and respiratory status.  Patient also fluid overloaded during his admission and was diuresed with improvement.  History obtained from patient and past medical records. No interpreter used during encounter.       Home Medications Prior to Admission medications   Medication Sig Start Date End Date Taking? Authorizing Provider  carboxymethylcellulose (REFRESH PLUS) 0.5 % SOLN 1 drop daily as needed (dry eyes).    [provider]  Cholecalciferol (VITAMIN D3 PO) Take 1 tablet by mouth daily.    [provider]  docusate sodium (COLACE) 100 MG capsule Take 1 capsule (100 mg total) by mouth 2 (two) times daily. Patient taking differently: Take 100 mg by mouth daily. 01/22/22   Debbrah Alar, PA-C  feeding supplement (ENSURE ENLIVE / ENSURE PLUS) LIQD Take 237 mLs by mouth daily. 05/01/22   Hosie Poisson, MD  ferrous sulfate 325 (65 FE) MG tablet Take 325 mg by mouth daily with breakfast.     [provider]  finasteride (PROSCAR) 5 MG tablet Take 1 tablet (5 mg total) by mouth daily. 05/01/22   Hosie Poisson, MD  furosemide (LASIX) 20 MG tablet Take 20 mg by mouth daily.    [provider]  glucosamine-chondroitin 500-400 MG tablet Take 1 tablet by mouth daily.    [provider]  linezolid (ZYVOX) 600 MG tablet Take 600 mg by mouth 2 (two) times daily. 2 times daily for 10 days    [provider]  pantoprazole (PROTONIX) 40 MG tablet Take 1 tablet (40 mg total) by mouth 2 (two) times daily. 04/30/22   Hosie Poisson, MD  predniSONE (DELTASONE) 5 MG tablet Take 2 tablets (10 mg total) by mouth daily. Patient taking differently: Take 10 mg by mouth daily. Taking 2 tabs by mouth daily 02/11/22   Narda Amber K, DO  pyridostigmine (MESTINON) 60 MG tablet Take 1 tablet (60 mg total) by mouth every 8 (eight) hours. 02/19/22   Shelly Coss, MD  tamsulosin (FLOMAX) 0.4 MG CAPS capsule Take 0.4 mg by mouth daily.    [provider]      Allergies    Aminoglycosides, Botox [onabotulinumtoxina], Corticosteroids, Hydroxychloroquine, Levaquin [levofloxacin], Macrolides and ketolides, Magnesium-containing compounds, Neuromuscular blocking agents, Penicillamine, Procainamide, and Quinidine    Review of Systems   Review of Systems  Unable to perform ROS: Acuity of condition    Physical Exam Updated Vital Signs BP 128/65   Pulse 80   Temp (!) 97.4 F (36.3 C) (Oral)   Resp 20   Ht '6\' 1"'$  (  1.854 m)   SpO2 100%   BMI 25.01 kg/m  Physical Exam Vitals and nursing note reviewed.  Constitutional:      General: He is not in acute distress.    Appearance: He is not ill-appearing.     Comments: Somnolent.  Difficult to arouse.  HENT:     Head: Normocephalic.  Eyes:     Pupils: Pupils are equal, round, and reactive to light.  Cardiovascular:     Rate and Rhythm: Normal rate and regular rhythm.     Pulses: Normal pulses.     Heart sounds:  Normal heart sounds. No murmur heard.    No friction rub. No gallop.  Pulmonary:     Effort: Pulmonary effort is normal.     Breath sounds: Normal breath sounds.     Comments: On 2L Nasal cannula. Abdominal:     General: Abdomen is flat. There is no distension.     Palpations: Abdomen is soft.     Tenderness: There is no abdominal tenderness. There is no guarding or rebound.  Musculoskeletal:        General: Normal range of motion.     Cervical back: Neck supple.     Comments: 2+ pitting edema bilaterally  Skin:    General: Skin is warm and dry.  Neurological:     General: No focal deficit present.     Mental Status: He is alert.  Psychiatric:        Mood and Affect: Mood normal.        Behavior: Behavior normal.     ED Results / Procedures / Treatments   Labs (all labs ordered are listed, but only abnormal results are displayed) Labs Reviewed  CBC WITH DIFFERENTIAL/PLATELET - Abnormal; Notable for the following components:      Result Value   WBC 10.9 (*)    RBC 3.37 (*)    Hemoglobin 9.9 (*)    HCT 34.7 (*)    MCV 103.0 (*)    MCHC 28.5 (*)    RDW 15.7 (*)    Neutro Abs 9.9 (*)    Lymphs Abs 0.6 (*)    All other components within normal limits  COMPREHENSIVE METABOLIC PANEL - Abnormal; Notable for the following components:   Potassium 5.3 (*)    Glucose, Bld 111 (*)    BUN 56 (*)    Calcium 10.8 (*)    Albumin 2.9 (*)    AST 12 (*)    GFR, Estimated 58 (*)    All other components within normal limits  BLOOD GAS, VENOUS - Abnormal; Notable for the following components:   pCO2, Ven 77 (*)    pO2, Ven 67 (*)    Bicarbonate 36.2 (*)    Acid-Base Excess 6.6 (*)    All other components within normal limits  CULTURE, BLOOD (ROUTINE X 2)  CULTURE, BLOOD (ROUTINE X 2)  RESP PANEL BY RT-PCR (FLU A&B, COVID) ARPGX2  LACTIC ACID, PLASMA  LACTIC ACID, PLASMA  URINALYSIS, ROUTINE W REFLEX MICROSCOPIC  BRAIN NATRIURETIC PEPTIDE    EKG None  Radiology DG Chest  Portable 1 View  Result Date: 06/06/2022 CLINICAL DATA:  Recent diagnosis of COVID pneumonia 2 days ago. Cough. 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 Stable cardiomediastinal contours. Small right pleural effusion is suspected. Diffuse hazy opacities are noted throughout both lungs. More focal airspace consolidation within the right midlung and right lower lung  is identified which appears increased compared with the exam from 04/17/2022. IMPRESSION: 1. Diffuse hazy opacities throughout both lungs compatible with more focal airspace opacification within the right midlung and right lower lung compatible with pneumonia. 2. Suspect small right pleural effusion. Electronically Signed   By: Kerby Moors M.D.   On: 05/12/2022 14:22    Procedures .Critical Care  Performed by: Suzy Bouchard, PA-C Authorized by: Suzy Bouchard, PA-C   Critical care provider statement:    Critical care time (minutes):  35   Critical care was necessary to treat or prevent imminent or life-threatening deterioration of the following conditions:  Respiratory failure   Critical care was time spent personally by me on the following activities:  Development of treatment plan with patient or surrogate, discussions with consultants, evaluation of patient's response to treatment, examination of patient, ordering and review of laboratory studies, ordering and review of radiographic studies, ordering and performing treatments and interventions, pulse oximetry, re-evaluation of patient's condition and review of old charts   I assumed direction of critical care for this patient from another provider in my specialty: no       Medications Ordered in ED Medications  cefTRIAXone (ROCEPHIN) 1 g in sodium chloride 0.9 % 100 mL IVPB (has no administration in time range)    ED Course/ Medical Decision Making/ A&P                            Medical Decision Making Amount and/or Complexity of Data Reviewed Independent Historian: caregiver    Details: Son at bedside provided history External Data Reviewed: notes.    Details: Recent admisison Labs: ordered. Decision-making details documented in ED Course. Radiology: ordered and independent interpretation performed. Decision-making details documented in ED Course.  Risk Decision regarding hospitalization.   88 year old male presents to the ED due to worsening shortness of breath and confusion in the setting of COVID-19. Patient tested positive on Monday. Recently admitted for respiratory failure, GI bleed, and CHF exacerbation.  Patient has a history of myasthenia gravis.  Upon arrival, patient somnolent; however, opens eyes to voice and then falls back asleep.  Patient protecting airway.  Dr. Laverta Baltimore had discussion with son about CODE STATUS at bedside who notes patient would want to be briefly intubated; however would not want CPR. During patient's most recent admission, it seems like he presented similarly and improved with BiPap. Will try bipap and reassess patient. Routine labs ordered. BNP to rule out CHF given fluid overload status on exam. Discussed with Dr. Laverta Baltimore who evaluated patient and agrees with assessment and plan.   Chest x-ray personally reviewed and interpreted demonstrates diffuse hazy opacities compatible with pneumonia.  Small pleural effusion.  Will cover with Rocephin for pneumonia however, possible COVID-pneumonia?  Patient unable to receive azithromycin due to myasthenia gravis.  CBC significant for leukocytosis at 10.9 and anemia at 9.9 which improved from 7 days ago.  VBG significant for elevated CO2 at 77.  Normal pH.  3:02 PM Reassessed patient at bedside, patient still no on Bipap, Called RT. RT coming to bedside.   Patient handed off to Suella Broad, PA-C at shift change pending admission.        Final Clinical Impression(s) / ED Diagnoses Final  diagnoses:  [id number] virus infection  Pneumonia due to infectious organism, unspecified laterality, unspecified part of lung    Rx / DC Orders ED Discharge Orders     None  Karie Kirks 05/28/2022 1535    Margette Fast, MD 05/18/2022 [phone]

## 2022-05-15 NOTE — ED Provider Notes (Cosign Needed Addendum)
88 yo male with history of myasthenia gravis among many complex health issues. COVID +, worsening of condition. Plan is for bipap, needs consult to CC for admission. If not improving with bipap- not likely a good candidate for intubation.  Physical Exam  BP 127/67   Pulse 69   Temp (!) 97.4 F (36.3 C) (Oral)   Resp 11   Ht '6\' 1"'$  (1.854 m)   SpO2 100%   BMI 25.01 kg/m   Physical Exam  Procedures  Procedures  ED Course / MDM    Medical Decision Making Amount and/or Complexity of Data Reviewed Labs: ordered. Radiology: ordered.  Risk Decision regarding hospitalization.   Discussed case with critical care who will see the patient. Dr. Alvino Chapel, ER attending, aware of patient's condition in the ER.   Patient seen by critical care, critical care to follow, request hospitalist admit.   Discussed with hospitalist who will discuss treatment plans with critical care.       Tacy Learn, PA-C 06/05/2022 1604    Davonna Belling, MD 06/05/2022 1643    Tacy Learn, PA-C 05/14/2022 Rogelio Seen, MD 05/16/22 [phone]

## 2022-05-15 NOTE — Progress Notes (Signed)
Transported on BiPAP to ICU without incident.  

## 2022-05-16 ENCOUNTER — Other Ambulatory Visit: Payer: Self-pay

## 2022-05-16 ENCOUNTER — Inpatient Hospital Stay: Payer: Medicare HMO

## 2022-05-16 DIAGNOSIS — U071 COVID-19: Secondary | ICD-10-CM | POA: Diagnosis not present

## 2022-05-16 DIAGNOSIS — E43 Unspecified severe protein-calorie malnutrition: Secondary | ICD-10-CM | POA: Insufficient documentation

## 2022-05-16 LAB — COMPREHENSIVE METABOLIC PANEL
ALT: 13 U/L (ref 0–44)
AST: 11 U/L — ABNORMAL LOW (ref 15–41)
Albumin: 2.6 g/dL — ABNORMAL LOW (ref 3.5–5.0)
Alkaline Phosphatase: 81 U/L (ref 38–126)
Anion gap: 7 (ref 5–15)
BUN: 63 mg/dL — ABNORMAL HIGH (ref 8–23)
CO2: 33 mmol/L — ABNORMAL HIGH (ref 22–32)
Calcium: 10.6 mg/dL — ABNORMAL HIGH (ref 8.9–10.3)
Chloride: 108 mmol/L (ref 98–111)
Creatinine, Ser: 1.27 mg/dL — ABNORMAL HIGH (ref 0.61–1.24)
GFR, Estimated: 55 mL/min — ABNORMAL LOW (ref >60)
Glucose, Bld: 80 mg/dL (ref 70–99)
Potassium: 4.2 mmol/L (ref 3.5–5.1)
Sodium: 148 mmol/L — ABNORMAL HIGH (ref 135–145)
Total Bilirubin: 0.8 mg/dL (ref 0.3–1.2)
Total Protein: 6.1 g/dL — ABNORMAL LOW (ref 6.5–8.1)

## 2022-05-16 LAB — PROCALCITONIN: Procalcitonin: <0.1 ng/mL

## 2022-05-16 LAB — PHOSPHORUS: Phosphorus: 1.7 mg/dL — ABNORMAL LOW (ref 2.5–4.6)

## 2022-05-16 LAB — FERRITIN: Ferritin: 719 ng/mL — ABNORMAL HIGH (ref 24–336)

## 2022-05-16 LAB — MAGNESIUM: Magnesium: 2.2 mg/dL (ref 1.7–2.4)

## 2022-05-16 LAB — C-REACTIVE PROTEIN
CRP: 1.2 mg/dL — ABNORMAL HIGH (ref <1.0)
CRP: 2.1 mg/dL — ABNORMAL HIGH (ref <1.0)

## 2022-05-16 LAB — GLUCOSE, CAPILLARY: Glucose-Capillary: 84 mg/dL (ref 70–99)

## 2022-05-16 MED ORDER — ADULT MULTIVITAMIN W/MINERALS CH
1.0000 | ORAL_TABLET | Freq: Every day | ORAL | Status: DC
  Administered 2022-05-16 – 2022-05-29 (×14): 1 via ORAL
  Filled 2022-05-16 (×15): qty 1

## 2022-05-16 MED ORDER — IPRATROPIUM-ALBUTEROL 0.5-2.5 (3) MG/3ML IN SOLN: 3.0000 mL | Freq: Four times a day (QID) | RESPIRATORY_TRACT | Status: DC | PRN

## 2022-05-16 MED ORDER — SODIUM PHOSPHATES 45 MMOLE/15ML IV SOLN
30.0000 mmol | Freq: Once | INTRAVENOUS | Status: AC
  Administered 2022-05-16: 30 mmol via INTRAVENOUS
  Filled 2022-05-16: qty 10

## 2022-05-16 MED ORDER — ENSURE ENLIVE PO LIQD
237.0000 mL | Freq: Two times a day (BID) | ORAL | Status: DC
  Administered 2022-05-16 – 2022-05-29 (×21): 237 mL via ORAL

## 2022-05-16 NOTE — Progress Notes (Signed)
Found PT back on BiPAP at 1150.

## 2022-05-16 NOTE — Progress Notes (Signed)
Per RN, PT removed from BiPAP at approximately 0845 and placed on 3 LPM nasal cannula. Order has been changed to PRN BiPAP.

## 2022-05-16 NOTE — Progress Notes (Addendum)
PROGRESS NOTE    Daniel Sharp  URK:[id number] DOB: 1933-04-13 DOA: 05/13/2022 PCP: [name], MD   Brief Narrative:  HPI: Daniel Sharp is a 88 y.o. male with medical history significant of hypertension, paroxysmal atrial fibrillation, chronic hypercapnic respiratory failure, BiPAP use at night, history of mitral valve repair, myasthenia gravis and many other comorbidities was brought into the emergency department from nursing home due to worsening shortness of breath and altered mental status.  Reportedly patient was tested positive for COVID-pneumonia 2 days ago on Monday.  He was not started on any medications.  Patient himself is on BiPAP since he arrived to the emergency department.  Unable to provide me any history.  History is obtained from the ED physician as well as from the son.   ED Course: Upon arrival to ED, he was hypoxic and hypercapnic, started on BiPAP.  BNP doubled from 4 weeks ago.  He received Rocephin as well as Lasix in the ED.  Initially PCCM was called for admission.  They evaluated the patient and had a discussion with the son and son decided to choose DNR for the patient and thus admission was deferred to hospitalist service.  PCCM will continue to follow.  Assessment & Plan:   Principal Problem:   COVID-19 Active Problems:   Acute metabolic encephalopathy   Acute on chronic respiratory failure with hypoxia and hypercapnia (HCC)  Acute on chronic respiratory failure with hypoxia and hypercapnia secondary to COVID-pneumonia as well as possible superimposed bacterial pneumonia/acute on chronic diastolic congestive heart failure: Patient does not meet sepsis cardiac sepsis ruled out.  Patient seems to have combination of COVID-pneumonia, bacterial pneumonia and acute on chronic diastolic congestive heart failure due to elevated BNP.  Much better today, I will have BiPAP.  We will continue remdesivir, antibiotics as well as IV Lasix.  PCCM also following and will defer  to them for further management.  Acute metabolic encephalopathy: He was very confused and unable to speak yesterday and he was on BiPAP due to all of the above.  This morning, he is fully alert and oriented.   BPH: Continue Flomax.   History of paroxysmal atrial fibrillation: Patient not on any rate control medications or any anticoagulation.    Hyperkalemia: Resolved.   CKD stage IIIa: At baseline.  DVT prophylaxis: enoxaparin (LOVENOX) injection 40 mg Start: 05/20/2022 2200   Code Status: DNR  Family Communication: Daughter-in-law present at bedside.  Plan of care discussed with patient in length and he/she verbalized understanding and agreed with it.  Status is: Inpatient Remains inpatient appropriate because: Still very sick.   Estimated body mass index is 25.01 kg/m as calculated from the following:   Height as of this encounter: '6\' 1"'$  (1.854 m).   Weight as of 04/29/22: 86 kg.  Pressure Injury Coccyx Mid Deep Tissue Pressure Injury - Purple or maroon localized area of discolored intact skin or blood-filled blister due to damage of underlying soft tissue from pressure and/or shear. (Active)     Location: Coccyx  Location Orientation: Mid  Staging: Deep Tissue Pressure Injury - Purple or maroon localized area of discolored intact skin or blood-filled blister due to damage of underlying soft tissue from pressure and/or shear.  Wound Description (Comments):   Present on Admission: Yes  Dressing Type Foam - Lift dressing to assess site every shift 05/16/22 0800     Pressure Injury 04/15/22 Heel Right Stage 1 -  Intact skin with non-blanchable redness of a localized area usually  over a bony prominence. (Active)  04/15/22 1105  Location: Heel  Location Orientation: Right  Staging: Stage 1 -  Intact skin with non-blanchable redness of a localized area usually over a bony prominence.  Wound Description (Comments):   Present on Admission: No  Dressing Type Foam - Lift dressing to  assess site every shift 04/30/22 0841     Pressure Injury 05/17/2022 Sacrum Mid Stage 3 -  Full thickness tissue loss. Subcutaneous fat may be visible but bone, tendon or muscle are NOT exposed. (Active)  05/14/2022 2100  Location: Sacrum  Location Orientation: Mid  Staging: Stage 3 -  Full thickness tissue loss. Subcutaneous fat may be visible but bone, tendon or muscle are NOT exposed.  Wound Description (Comments):   Present on Admission: Yes  Dressing Type Foam - Lift dressing to assess site every shift 05/16/22 0800   Nutritional Assessment: Body mass index is 25.01 kg/m.Marland Kitchen Seen by dietician.  I agree with the assessment and plan as outlined below: Nutrition Status:        . Skin Assessment: I have examined the patient's skin and I agree with the wound assessment as performed by the wound care RN as outlined below: Pressure Injury Coccyx Mid Deep Tissue Pressure Injury - Purple or maroon localized area of discolored intact skin or blood-filled blister due to damage of underlying soft tissue from pressure and/or shear. (Active)     Location: Coccyx  Location Orientation: Mid  Staging: Deep Tissue Pressure Injury - Purple or maroon localized area of discolored intact skin or blood-filled blister due to damage of underlying soft tissue from pressure and/or shear.  Wound Description (Comments):   Present on Admission: Yes  Dressing Type Foam - Lift dressing to assess site every shift 05/16/22 0800     Pressure Injury 04/15/22 Heel Right Stage 1 -  Intact skin with non-blanchable redness of a localized area usually over a bony prominence. (Active)  04/15/22 1105  Location: Heel  Location Orientation: Right  Staging: Stage 1 -  Intact skin with non-blanchable redness of a localized area usually over a bony prominence.  Wound Description (Comments):   Present on Admission: No  Dressing Type Foam - Lift dressing to assess site every shift 04/30/22 0841     Pressure Injury 06/04/2022  Sacrum Mid Stage 3 -  Full thickness tissue loss. Subcutaneous fat may be visible but bone, tendon or muscle are NOT exposed. (Active)  05/13/2022 2100  Location: Sacrum  Location Orientation: Mid  Staging: Stage 3 -  Full thickness tissue loss. Subcutaneous fat may be visible but bone, tendon or muscle are NOT exposed.  Wound Description (Comments):   Present on Admission: Yes  Dressing Type Foam - Lift dressing to assess site every shift 05/16/22 0800    Consultants:  PCCM  Procedures:  None  Antimicrobials:  Anti-infectives (From admission, onward)    Start     Dose/Rate Route Frequency Ordered Stop   05/16/22 1500  cefTRIAXone (ROCEPHIN) 1 g in sodium chloride 0.9 % 100 mL IVPB        1 g 200 mL/hr over 30 Minutes Intravenous Every 24 hours 05/28/2022 1719 05/20/22 1459   05/16/22 1000  remdesivir 100 mg in sodium chloride 0.9 % 100 mL IVPB       See Hyperspace for full Linked Orders Report.   100 mg 200 mL/hr over 30 Minutes Intravenous Daily 05/22/2022 1741 05/18/22 0959   05/17/2022 1915  remdesivir 200 mg in sodium chloride 0.9%  250 mL IVPB       See Hyperspace for full Linked Orders Report.   200 mg 580 mL/hr over 30 Minutes Intravenous Once 05/13/2022 1741 05/12/2022 2300   05/16/2022 1500  cefTRIAXone (ROCEPHIN) 1 g in sodium chloride 0.9 % 100 mL IVPB        1 g 200 mL/hr over 30 Minutes Intravenous  Once 05/14/2022 1457 05/17/2022 1836         Subjective: Patient seen and examined.  Daughter-in-law at the bedside.  Patient feels better.  He is off of BiPAP.  He is fully alert and oriented.  Objective: Vitals:   05/16/22 1000 05/16/22 1100 05/16/22 1152 05/16/22 1200  BP: (!) 147/79 131/73 131/73   Pulse: (!) 117 (!) 107 (!) 116 98  Resp: 20 (!) '24 20 20  '$ Temp:      TempSrc:      SpO2: 100% 100% 100% 100%  Height:        Intake/Output Summary (Last 24 hours) at 05/16/2022 1304 Last data filed at 05/16/2022 1000 Gross per 24 hour  Intake 525.13 ml  Output 1700 ml   Net -1174.87 ml   There were no vitals filed for this visit.  Examination:  General exam: Appears calm and comfortable  Respiratory system: Diminished breath sounds bilaterally. Respiratory effort normal. Cardiovascular system: S1 & S2 heard, RRR. No JVD, murmurs, rubs, gallops or clicks.  +3 pitting edema bilateral lower extremity. Gastrointestinal system: Abdomen is nondistended, soft and nontender. No organomegaly or masses felt. Normal bowel sounds heard. Central nervous system: Alert and oriented. No focal neurological deficits. Extremities: Symmetric 5 x 5 power. Skin: No rashes, lesions or ulcers  Data Reviewed: I have personally reviewed following labs and imaging studies  CBC: Recent Labs  Lab 05/12/2022 1415 05/28/2022 1850  WBC 10.9* 8.1  NEUTROABS 9.9*  --   HGB 9.9* 8.9*  HCT 34.7* 31.5*  MCV 103.0* 102.9*  PLT 164 270   Basic Metabolic Panel: Recent Labs  Lab 05/12/2022 1415 05/22/2022 1850 05/16/22 0323  NA 145  --  148*  K 5.3*  --  4.2  CL 107  --  108  CO2 32  --  33*  GLUCOSE 111*  --  80  BUN 56*  --  63*  CREATININE 1.21 0.99 1.27*  CALCIUM 10.8*  --  10.6*  MG  --   --  2.2  PHOS  --   --  1.7*   GFR: CrCl cannot be calculated (Unknown ideal weight.). Liver Function Tests: Recent Labs  Lab 05/13/2022 1415 05/16/22 0323  AST 12* 11*  ALT 15 13  ALKPHOS 97 81  BILITOT 0.8 0.8  PROT 7.0 6.1*  ALBUMIN 2.9* 2.6*   No results for input(s): "LIPASE", "AMYLASE" in the last 168 hours. No results for input(s): "AMMONIA" in the last 168 hours. Coagulation Profile: No results for input(s): "INR", "PROTIME" in the last 168 hours. Cardiac Enzymes: No results for input(s): "CKTOTAL", "CKMB", "CKMBINDEX", "TROPONINI" in the last 168 hours. BNP (last 3 results) No results for input(s): "PROBNP" in the last 8760 hours. HbA1C: No results for input(s): "HGBA1C" in the last 72 hours. CBG: No results for input(s): "GLUCAP" in the last 168 hours. Lipid  Profile: No results for input(s): "CHOL", "HDL", "LDLCALC", "TRIG", "CHOLHDL", "LDLDIRECT" in the last 72 hours. Thyroid Function Tests: No results for input(s): "TSH", "T4TOTAL", "FREET4", "T3FREE", "THYROIDAB" in the last 72 hours. Anemia Panel: Recent Labs    05/13/2022 1830 05/16/22 0323  FERRITIN 602* 719*   Sepsis Labs: Recent Labs  Lab 05/18/2022 1415 05/13/2022 1545 05/18/2022 1850  PROCALCITON  --   --  <0.10  LATICACIDVEN 1.0 1.1  --     Recent Results (from the past 240 hour(s))  Blood culture (routine x 2)     Status: None (Preliminary result)   Collection Time: 05/29/2022  2:15 PM   Specimen: BLOOD  Result Value Ref Range Status   Specimen Description   Final    BLOOD BLOOD RIGHT HAND Performed at [facility] [address] [place], [place] [address]    Special Requests   Final    BOTTLES DRAWN AEROBIC AND ANAEROBIC Blood Culture results may not be optimal due to an inadequate volume of blood received in culture bottles Performed at [facility] [address] [place] [address] [place], [place] [address]    Culture   Final    NO GROWTH < 24 HOURS Performed at [facility] [address] [place], [place] [address]    Report Status PENDING  Incomplete  Blood culture (routine x 2)     Status: None (Preliminary result)   Collection Time: 05/16/2022  2:15 PM   Specimen: BLOOD  Result Value Ref Range Status   Specimen Description   Final    BLOOD BLOOD RIGHT ARM Performed at [facility] [address], [place], [place] [address]    Special Requests   Final    BOTTLES DRAWN AEROBIC AND ANAEROBIC Blood Culture results may not be optimal due to an inadequate volume of blood received in culture bottles Performed at [facility] [address], [place], [place] [address]    Culture   Final    NO GROWTH < 24 HOURS Performed at [facility] [address], [place], [place] [address]    Report  Status PENDING  Incomplete  Resp Panel by RT-PCR (Flu A&B, Covid) Peripheral     Status: Abnormal   Collection Time: 05/28/2022  2:15 PM   Specimen: Peripheral; Nasal Swab  Result Value Ref Range Status   SARS Coronavirus 2 by RT PCR POSITIVE (A) NEGATIVE Final    Comment: (NOTE) SARS-CoV-2 target nucleic acids are DETECTED.  The SARS-CoV-2 RNA is generally detectable in upper respiratory specimens during the acute phase of infection. Positive results are indicative of the presence of the identified virus, but do not rule out bacterial infection or co-infection with other pathogens not detected by the test. Clinical correlation with patient history and other diagnostic information is necessary to determine patient infection status. The expected result is Negative.  Fact Sheet for Patients: EntrepreneurPulse.com.au  Fact Sheet for Healthcare Providers: IncredibleEmployment.be  This test is not yet approved or cleared by the [place] FDA and  has been authorized for detection and/or diagnosis of SARS-CoV-2 by FDA under an Emergency Use Authorization (EUA).  This EUA will remain in effect (meaning this test can be used) for the duration of  the COVID-19 declaration under Section 564(b)(1) of the A ct, 21 U.S.C. section 360bbb-3(b)(1), unless the authorization is terminated or revoked sooner.     Influenza A by PCR NEGATIVE NEGATIVE Final   Influenza B by PCR NEGATIVE NEGATIVE Final    Comment: (NOTE) The Xpert Xpress SARS-CoV-2/FLU/RSV plus assay is intended as an aid in the diagnosis of influenza from Nasopharyngeal swab specimens and should not be used as a sole basis for treatment. Nasal washings and aspirates are unacceptable  for Xpert Xpress SARS-CoV-2/FLU/RSV testing.  Fact Sheet for Patients: EntrepreneurPulse.com.au  Fact Sheet for Healthcare Providers: IncredibleEmployment.be  This test is  not yet approved or cleared by the [place] FDA and has been authorized for detection and/or diagnosis of SARS-CoV-2 by FDA under an Emergency Use Authorization (EUA). This EUA will remain in effect (meaning this test can be used) for the duration of the COVID-19 declaration under Section 564(b)(1) of the Act, 21 U.S.C. section 360bbb-3(b)(1), unless the authorization is terminated or revoked.  Performed at [facility], [place] [address] [place], [place] [address]   Respiratory (~20 pathogens) panel by PCR     Status: None   Collection Time: 05/17/2022  2:50 PM   Specimen: Nasopharyngeal Swab; Respiratory  Result Value Ref Range Status   Adenovirus NOT DETECTED NOT DETECTED Final   Coronavirus 229E NOT DETECTED NOT DETECTED Final    Comment: (NOTE) The Coronavirus on the Respiratory Panel, DOES NOT test for the novel  Coronavirus (2019 nCoV)    Coronavirus HKU1 NOT DETECTED NOT DETECTED Final   Coronavirus NL63 NOT DETECTED NOT DETECTED Final   Coronavirus OC43 NOT DETECTED NOT DETECTED Final   Metapneumovirus NOT DETECTED NOT DETECTED Final   Rhinovirus / Enterovirus NOT DETECTED NOT DETECTED Final   Influenza A NOT DETECTED NOT DETECTED Final   Influenza B NOT DETECTED NOT DETECTED Final   Parainfluenza Virus 1 NOT DETECTED NOT DETECTED Final   Parainfluenza Virus 2 NOT DETECTED NOT DETECTED Final   Parainfluenza Virus 3 NOT DETECTED NOT DETECTED Final   Parainfluenza Virus 4 NOT DETECTED NOT DETECTED Final   Respiratory Syncytial Virus NOT DETECTED NOT DETECTED Final   Bordetella pertussis NOT DETECTED NOT DETECTED Final   Bordetella Parapertussis NOT DETECTED NOT DETECTED Final   Chlamydophila pneumoniae NOT DETECTED NOT DETECTED Final   Mycoplasma pneumoniae NOT DETECTED NOT DETECTED Final    Comment: Performed at [facility], [place]. [address] [place], [place] [address]  MRSA Next Gen by PCR, Nasal     Status: Abnormal   Collection Time: 05/14/2022   8:26 PM   Specimen: Nasal Mucosa; Nasal Swab  Result Value Ref Range Status   MRSA by PCR Next Gen DETECTED (A) NOT DETECTED Final    Comment: (NOTE) The GeneXpert MRSA Assay (FDA approved for NASAL specimens only), is one component of a comprehensive MRSA colonization surveillance program. It is not intended to diagnose MRSA infection nor to guide or monitor treatment for MRSA infections. Test performance is not FDA approved in patients less than 60 years old. Performed at [facility], [place] [address] [place], [place] [address]      Radiology Studies: DG Chest Portable 1 View  Result Date: 05/16/2022 CLINICAL DATA:  Recent diagnosis of COVID pneumonia 2 days ago. Cough. 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 Stable cardiomediastinal contours. Small right pleural effusion is suspected. Diffuse hazy opacities are noted throughout both lungs. More focal airspace consolidation within the right midlung and right lower lung is identified which appears increased compared with the exam from 04/17/2022. IMPRESSION: 1. Diffuse hazy opacities throughout both lungs compatible with more focal airspace opacification within the right midlung and right lower lung compatible with pneumonia. 2. Suspect small right pleural effusion. Electronically Signed   By: Kerby Moors M.D.   On: 05/09/2022 14:22    Scheduled Meds:  Chlorhexidine Gluconate Cloth  6 each Topical Daily  enoxaparin (LOVENOX) injection  40 mg Subcutaneous Q24H   finasteride  5 mg Oral Daily   furosemide  40 mg Intravenous Daily   mouth rinse  15 mL Mouth Rinse 4 times per day   tamsulosin  0.4 mg Oral Daily   Continuous Infusions:  cefTRIAXone (ROCEPHIN)  IV     famotidine (PEPCID) IV Stopped (05/16/22 1155)   remdesivir 100 mg in sodium chloride 0.9 % 100 mL IVPB Stopped (05/16/22 1247)     LOS: 1 day   Darliss Cheney,  MD [facility]  05/16/2022, 1:04 PM   *Please note that this is a verbal dictation therefore any spelling or grammatical errors are due to the "[place] [facility]" system interpretation.  Please page via [place] and do not message via secure chat for urgent patient care matters. Secure chat can be used for non urgent patient care matters.  How to contact the [facility] Attending or Consulting provider [place] or covering provider during after hours [place], for this patient?  Check the care team in [facility] and look for a) attending/consulting TRH provider listed and b) the [facility] team listed. Page or secure chat [phone]. Log into [email or web address] and use [facility]'s universal password to access. If you do not have the password, please contact the hospital operator. Locate the [facility] provider you are looking for under [facility] and page to a number that you can be directly reached. If you still have difficulty reaching the provider, please page the [facility] (Director on Call) for the Hospitalists listed on amion for assistance.

## 2022-05-16 NOTE — TOC Progression Note (Addendum)
Transition of Care [facility]) - Progression Note    Patient Details  Name: Daniel Sharp MRN: [id number] Date of Birth: 1932-11-21  [facility] CM/SW Contact  Henrietta Dine, RN Phone Number: 05/16/2022, 3:15 PM  Clinical Narrative:    Pt from [facility]; attempted to verify residency and level of care; LVM for Toma Copier at [phone]; pt enrolled in [facility] outpatient PC; they are following; attempted to contact POC Sharla Kidney [phone]); LVM; TOC will follow for d/c needs.  - 1623 - pt's son, [name] called back and verified that he would like for his father to return to [facility] at d/c; he also says that he has spoken with Toma Copier at [facility] regarding this.    Barriers to Discharge: Continued Medical Work up  Expected Discharge Plan and Services                                                 Social Determinants of Health (SDOH) Interventions    Readmission Risk Interventions     No data to display

## 2022-05-16 NOTE — Progress Notes (Addendum)
Initial Nutrition Assessment  DOCUMENTATION CODES:  Severe malnutrition in context of acute illness/injury  INTERVENTION:  -Advance diet as clinically appropriate -Provide diet compliant ONS TID (Boost Breeze vs Ensure Plus HP) -Provide daily MVI-increased needs for wound healing, provides only 25% DV magnesium -If pt unable to take po diet, or requires continuous bipap, consider nutrition support.  NUTRITION DIAGNOSIS:  Severe Malnutrition related to acute illness (covid) as evidenced by moderate fat depletion, moderate muscle depletion, edema (decreased intake but unable to provide details).  GOAL:  Patient will meet greater than or equal to 90% of their needs  MONITOR:  PO intake, Diet advancement, Supplement acceptance  REASON FOR ASSESSMENT:  Consult Enteral/tube feeding initiation and management  ASSESSMENT:  Pt is an 88yo M with PMH of HTN, A fib, chronic blood loss anemia, kidney cancer s/p L nephrectomy, chronic right sided heart failure, BPH, CKD 3, ocular myasthenia gravis, and arthritis who was recently admitted with hematuria and gi bleed. Presents this admission for worsening shortness of breath and altered mental status found to be covid positive.  Visited pt at bedside this afternoon. He was off Bipap and able to answer questions appropriately. No weight available this admission. Pt reports weight loss but notes some of the weight loss was related to fluid. He reports decreased po intake over the last several days. NFPE shows moderate muscle wasting, moderate fat loss and moderate to severe edema. Pt meets ASPEN criteria for severe protein calorie malnutrition r/t acute illness (covid).  Per report, plan to start pt on liquid diet today. Discussed with pt and offered ONS. Pt is agreeable to 3 shakes/day. He would like 1 available late at night because some times he gets hungry at midnight. Pt c/o taste changes, and has multiple pressure injuries. Recommend providing daily  MVI. As pt becomes more stable, may also benefit from [facility] but will hold at this time due to L-arginine content.  Of note, pt is able to come off bipap periodically and would like to start po intake. No NGT in place at this time. Recommend trial of po diet as appropriate, prior to considering nutrition support.  Medications reviewed and include: lasix, pepcid, remdesivir, sodium phosphate  Labs reviewed: Na:148, BUN:63, Cr:1.27, Phos:1.7, GFR:55, BNP:393.7, ferritin:719, CRP:2.1, albumin: 2.6   NUTRITION - FOCUSED PHYSICAL EXAM:  Flowsheet Row Most Recent Value  Orbital Region Moderate depletion  Upper Arm Region Moderate depletion  Thoracic and Lumbar Region Mild depletion  Buccal Region Moderate depletion  Temple Region Moderate depletion  Clavicle Bone Region Moderate depletion  Clavicle and Acromion Bone Region Moderate depletion  Scapular Bone Region Unable to assess  Dorsal Hand Moderate depletion  Patellar Region Mild depletion  Anterior Thigh Region Moderate depletion  Posterior Calf Region Unable to assess  [edema in LE]  Edema (RD Assessment) Severe  Hair Reviewed  Eyes Reviewed  Mouth Reviewed  Skin Reviewed  Nails Reviewed       Diet Order:   Diet Order             Diet NPO time specified  Diet effective now                   EDUCATION NEEDS:  Education needs have been addressed  Skin:  Skin Assessment: Skin Integrity Issues: Skin Integrity Issues:: Stage III DTI: coccyx Stage I: heel Stage III: sacrum  Last BM:  11/8  Height:  Ht Readings from Last 1 Encounters:  05/22/2022 '6\' 1"'$  (1.854 m)  Weight:  Wt Readings from Last 1 Encounters:  04/29/22 86 kg   BMI:  Body mass index is 25.01 kg/m.  Estimated Nutritional Needs:  Kcal:  [id number] Protein:  100-130g Fluid:  2141m  KCandise Bowens MS, RD, LDN, CNSC See AMiON for contact information

## 2022-05-16 NOTE — Progress Notes (Signed)
[facility] ADULT ICU REPLACEMENT PROTOCOL   The patient does apply for the [facility] Adult ICU Electrolyte Replacment Protocol based on the criteria listed below:   1.Exclusion criteria: TCTS, ECMO, Dialysis, and Myasthenia Gravis patients 2. Is GFR >/= 30 ml/min? Yes.    Patient's GFR today is 55 3. Is SCr </= 2? No. Patient's SCr is 1.27 mg/dL 4. Did SCr increase >/= 0.5 in 24 hours? No. 5.Pt's weight >40kg  Yes.   6. Abnormal electrolyte(s): phos 1.7  7. Electrolytes replaced per protocol 8.  Call MD STAT for K+ </= 2.5, Phos </= 1, or Mag </= 1 Physician:  n/a  Daniel Sharp 05/16/2022 5:55 AM

## 2022-05-16 NOTE — Progress Notes (Signed)
NAME:  Daniel Sharp, MRN:  [id number], DOB:  Jan 08, 1933, LOS: 1 ADMISSION DATE:  05/14/2022, CONSULTATION DATE:  05/16/22 REFERRING MD:  Dr. Laverta Baltimore ED, CHIEF COMPLAINT:  AMS   History of Present Illness:  88 year old male myasthenia gravis on chronic steroids presents with altered mental status.  Resides at skilled nursing facility.  Chest positive COVID with the last 48 to 72 hours.  Was doing okay yesterday.  Relatively unresponsive today.  Prompted ED evaluation.  Chest x-ray reveals diffuse hazy opacities, edema versus atypical infection versus viral infection on my review and interpretation.  Labs showed creatinine at relative baseline.  Mild elevation of potassium.  CBC okay.  BNP doubled from most recent value, over 600 at this time.  He was placed on BiPAP.  Unclear exactly why.  VBG obtained prior to BiPAP indicates a pH of 7.28 with PCO2 of 77.  Well compensated.  Pertinent  Medical History  Myasthenia gravis, chronic hypercarbic respiratory failure, BiPAP use at night  Significant Hospital Events: Including procedures, antibiotic start and stop dates in addition to other pertinent events   11/8 admitted from skilled nursing facility with altered mental status with recent COVID-positive test there  Interim History / Subjective:  Afebrile  UOP 1.2L Per RN- woke up this morning and asking what happened to him. Has been oriented and asking for water.  Wearing BiPAP now qHS and with naps  Objective   Blood pressure (!) 113/55, pulse 98, temperature (!) 97.2 F (36.2 C), temperature source Axillary, resp. rate 20, height '6\' 1"'$  (1.854 m), SpO2 100 %.    FiO2 (%):  [35 %] 35 % Set Rate:  [20 bmp] 20 bmp Vt Set:  [600 mL] 600 mL   Intake/Output Summary (Last 24 hours) at 05/16/2022 1307 Last data filed at 05/16/2022 1200 Gross per 24 hour  Intake 661.74 ml  Output 1700 ml  Net -1038.26 ml   There were no vitals filed for this visit.  Examination: General:  Chronically ill appearing  elderly male resting in bed in NAD on BiPAP for a nap> BiPAP removed HEENT: MM pink/dry, no JVP Neuro:  oriented to person place and year, moves all extremities, generally weak but able to shrug shoulders, speech comprehensible CV: irir with occ ectopy PULM:  non labored, getting good volumes ~700 on AVAPS, very diminished, no wheeze.  NP cough.   Taken off bipap> no resp distress.  Brief desaturation into the 80s without increased WOB but quickly recovered and doing well on 3L GI: soft, bs+, ND, NT, male purwick Extremities: warm/dry, +2-3 LE edema   sCr 1.21> 1.27, Na 148, phos 1.7, BNP 602> 393, PCT yes < 0.1  Resolved Hospital Problem list   N/a  Assessment & Plan:  Toxic Metabolic Encephalopathy: Suspect multifactorial, related to COVID encephalopathy, delirium.  While PCO2 is elevated in 70s, he is compensated with a normal pH.  Felt that hypercarbia was not primary driver of his encephalopathy. Plan:  - mental status is much improved today - delirium precautions - supportive care, avoid centrally acting medications - BiPAP prn if progressively altered   Acute on chronic hypercarbic respiratory failure:  multifactorial> HF exacerbation/ volume overload/ pulmonary edema,  COVID, and possible PNA - neg RVP Plan - much improved clinically today - remains DNR/ DNI - continue BiPAP, AVAPs ok vs back to his home settings and qHS and prn with naps (uses BiPAP at baseline) - assess swallow function and then ok to advance diet as tolerated if  ok with primary team, avoid over eating then using BiPAP  - wean O2 for goal sat 88-93%.  Expect some brief desaturations with exertion  - add prn BD - cont empiric ceftriaxone for 5 days.  MRSA PCR is positive but has made clinical improvement.  Hold on adding MRSA coverage.  Check strep urine  - continue diuresis per primary team - if further respiratory decompensation> consider transition to comfort measures  - given improvement, hold on  steroids  - cont remdesivir  - cont airborne precautions   Remainder per primary team.  PCCM will signoff at this time.  Please call us back if we can be of any further assistance.   Best Practice (right click and "Reselect all SmartList Selections" daily)   Per Primary  Labs   CBC: Recent Labs  Lab 05/28/2022 1415 05/26/2022 1850  WBC 10.9* 8.1  NEUTROABS 9.9*  --   HGB 9.9* 8.9*  HCT 34.7* 31.5*  MCV 103.0* 102.9*  PLT 164 983    Basic Metabolic Panel: Recent Labs  Lab 05/09/2022 1415 06/01/2022 1850 05/16/22 0323  NA 145  --  148*  K 5.3*  --  4.2  CL 107  --  108  CO2 32  --  33*  GLUCOSE 111*  --  80  BUN 56*  --  63*  CREATININE 1.21 0.99 1.27*  CALCIUM 10.8*  --  10.6*  MG  --   --  2.2  PHOS  --   --  1.7*   GFR: CrCl cannot be calculated (Unknown ideal weight.). Recent Labs  Lab 06/06/2022 1415 05/23/2022 1545 05/13/2022 1850  PROCALCITON  --   --  <0.10  WBC 10.9*  --  8.1  LATICACIDVEN 1.0 1.1  --     Liver Function Tests: Recent Labs  Lab 05/22/2022 1415 05/16/22 0323  AST 12* 11*  ALT 15 13  ALKPHOS 97 81  BILITOT 0.8 0.8  PROT 7.0 6.1*  ALBUMIN 2.9* 2.6*   No results for input(s): "LIPASE", "AMYLASE" in the last 168 hours. No results for input(s): "AMMONIA" in the last 168 hours.  ABG    Component Value Date/Time   PHART 7.44 04/19/2022 0440   PCO2ART 57 (H) 04/19/2022 0440   PO2ART 108 04/19/2022 0440   HCO3 36.2 (H) 05/18/2022 1430   TCO2 30 01/27/2022 1439   O2SAT 95 06/01/2022 1430     Coagulation Profile: No results for input(s): "INR", "PROTIME" in the last 168 hours.  Cardiac Enzymes: No results for input(s): "CKTOTAL", "CKMB", "CKMBINDEX", "TROPONINI" in the last 168 hours.  HbA1C: No results found for: "HGBA1C"  CBG: No results for input(s): "GLUCAP" in the last 168 hours.    Kennieth Rad, MSN, AG-ACNP-BC [facility] 05/16/2022, 1:07 PM  See Amion for pager If no response to pager, please  call PCCM consult pager After 7:00 pm call Elink

## 2022-05-16 NOTE — Progress Notes (Signed)
Per RN, PT removed from BiPAP approximately 1400.

## 2022-05-17 ENCOUNTER — Other Ambulatory Visit (HOSPITAL_COMMUNITY): Payer: Medicare HMO

## 2022-05-17 ENCOUNTER — Inpatient Hospital Stay (HOSPITAL_COMMUNITY): Payer: Medicare HMO

## 2022-05-17 DIAGNOSIS — A498 Other bacterial infections of unspecified site: Secondary | ICD-10-CM

## 2022-05-17 DIAGNOSIS — U071 COVID-19: Secondary | ICD-10-CM | POA: Diagnosis not present

## 2022-05-17 LAB — BLOOD CULTURE ID PANEL (REFLEXED) - BCID2

## 2022-05-17 LAB — COMPREHENSIVE METABOLIC PANEL
ALT: 13 U/L (ref 0–44)
AST: 15 U/L (ref 15–41)
Albumin: 2.5 g/dL — ABNORMAL LOW (ref 3.5–5.0)
Alkaline Phosphatase: 88 U/L (ref 38–126)
Anion gap: 6 (ref 5–15)
BUN: 58 mg/dL — ABNORMAL HIGH (ref 8–23)
CO2: 36 mmol/L — ABNORMAL HIGH (ref 22–32)
Calcium: 9.9 mg/dL (ref 8.9–10.3)
Chloride: 106 mmol/L (ref 98–111)
Creatinine, Ser: 1.34 mg/dL — ABNORMAL HIGH (ref 0.61–1.24)
GFR, Estimated: 51 mL/min — ABNORMAL LOW (ref >60)
Glucose, Bld: 102 mg/dL — ABNORMAL HIGH (ref 70–99)
Potassium: 4 mmol/L (ref 3.5–5.1)
Sodium: 148 mmol/L — ABNORMAL HIGH (ref 135–145)
Total Bilirubin: 0.6 mg/dL (ref 0.3–1.2)
Total Protein: 6.1 g/dL — ABNORMAL LOW (ref 6.5–8.1)

## 2022-05-17 LAB — STREP PNEUMONIAE URINARY ANTIGEN: Strep Pneumo Urinary Antigen: NEGATIVE

## 2022-05-17 LAB — FERRITIN: Ferritin: 854 ng/mL — ABNORMAL HIGH (ref 24–336)

## 2022-05-17 LAB — PHOSPHORUS: Phosphorus: 3.6 mg/dL (ref 2.5–4.6)

## 2022-05-17 LAB — MAGNESIUM: Magnesium: 2.2 mg/dL (ref 1.7–2.4)

## 2022-05-17 LAB — C-REACTIVE PROTEIN: CRP: 4.4 mg/dL — ABNORMAL HIGH (ref <1.0)

## 2022-05-17 MED ORDER — SODIUM CHLORIDE 0.9 % IV SOLN
2.0000 g | Freq: Four times a day (QID) | INTRAVENOUS | Status: DC
  Administered 2022-05-17 – 2022-05-30 (×51): 2 g via INTRAVENOUS
  Filled 2022-05-17 (×54): qty 2000

## 2022-05-17 MED ORDER — PANTOPRAZOLE SODIUM 40 MG PO TBEC
40.0000 mg | DELAYED_RELEASE_TABLET | Freq: Two times a day (BID) | ORAL | Status: DC
  Administered 2022-05-17 – 2022-05-29 (×23): 40 mg via ORAL
  Filled 2022-05-17 (×24): qty 1

## 2022-05-17 MED ORDER — SODIUM CHLORIDE 0.9 % IV SOLN
2.0000 g | Freq: Two times a day (BID) | INTRAVENOUS | Status: DC
  Administered 2022-05-17 – 2022-05-29 (×26): 2 g via INTRAVENOUS
  Filled 2022-05-17 (×25): qty 20

## 2022-05-17 MED ORDER — PREDNISONE 10 MG PO TABS
10.0000 mg | ORAL_TABLET | Freq: Every day | ORAL | Status: DC
  Administered 2022-05-17 – 2022-05-29 (×13): 10 mg via ORAL
  Filled 2022-05-17 (×14): qty 1

## 2022-05-17 MED ORDER — ORAL CARE MOUTH RINSE
15.0000 mL | OROMUCOSAL | Status: DC
  Administered 2022-05-17 – 2022-05-30 (×42): 15 mL via OROMUCOSAL

## 2022-05-17 MED ORDER — DOCUSATE SODIUM 100 MG PO CAPS
100.0000 mg | ORAL_CAPSULE | Freq: Two times a day (BID) | ORAL | Status: DC
  Administered 2022-05-17 – 2022-05-28 (×11): 100 mg via ORAL
  Filled 2022-05-17 (×17): qty 1

## 2022-05-17 MED ORDER — CHLORHEXIDINE GLUCONATE CLOTH 2 % EX PADS
6.0000 | MEDICATED_PAD | Freq: Every day | CUTANEOUS | Status: DC
  Administered 2022-05-18: 6 via TOPICAL

## 2022-05-17 MED ORDER — PYRIDOSTIGMINE BROMIDE 60 MG PO TABS
60.0000 mg | ORAL_TABLET | Freq: Three times a day (TID) | ORAL | Status: DC
  Administered 2022-05-17 – 2022-05-29 (×32): 60 mg via ORAL
  Filled 2022-05-17 (×41): qty 1

## 2022-05-17 MED ORDER — FERROUS SULFATE 325 (65 FE) MG PO TABS
325.0000 mg | ORAL_TABLET | Freq: Every day | ORAL | Status: DC
  Administered 2022-05-18 – 2022-05-29 (×11): 325 mg via ORAL
  Filled 2022-05-17 (×12): qty 1

## 2022-05-17 MED ORDER — MUPIROCIN 2 % EX OINT
1.0000 | TOPICAL_OINTMENT | Freq: Two times a day (BID) | CUTANEOUS | Status: AC
  Administered 2022-05-17 – 2022-05-21 (×10): 1 via NASAL
  Filled 2022-05-17 (×2): qty 22

## 2022-05-17 MED ORDER — ORAL CARE MOUTH RINSE
15.0000 mL | OROMUCOSAL | Status: DC | PRN
  Administered 2022-05-24: 15 mL via OROMUCOSAL

## 2022-05-17 NOTE — Progress Notes (Addendum)
PHARMACY - PHYSICIAN COMMUNICATION CRITICAL VALUE ALERT - BLOOD CULTURE IDENTIFICATION (BCID)  Daniel Sharp is an 88 y.o. male who presented to [facility] on 05/18/2022 with a chief complaint of SOB/AMS  Assessment: 15 YOM being treated for COVID/CAP - recently admitted 10/5-10/24 with E faecalis bacteremia (in 2 of 4 bottles) thought to be seeded from newly diagnosed rectal/colon CA. TTE at that time did not note vegetations, hx MV repair from prior episode of IE from dental source. Now with recurrent BCx growing GPC in chains in 1 of 4 bottles with BCID again detecting Enterococcus faecalis.   Name of physician (or Provider) Contacted: [facility] & ID Juleen China)  Current antibiotics: Rocephin 1g IV every 24 hours  Changes to prescribed antibiotics recommended:  Per discussion with ID - will treat empirically for concern for endocarditis given recurrent bacteremia until ruled out with TEE Start Ampicillin 2g IV every 6 hours (renal adjustment for CrCl 30-50 ml/min) Start Rocephin 2g IV every 12 hours  Results for orders placed or performed during the hospital encounter of 06/03/2022  Blood Culture ID Panel (Reflexed) (Collected: 05/26/2022  2:15 PM)  Result Value Ref Range   Enterococcus faecalis DETECTED (A) NOT DETECTED   Enterococcus Faecium NOT DETECTED NOT DETECTED   Listeria monocytogenes NOT DETECTED NOT DETECTED   Staphylococcus species NOT DETECTED NOT DETECTED   Staphylococcus aureus (BCID) NOT DETECTED NOT DETECTED   Staphylococcus epidermidis NOT DETECTED NOT DETECTED   Staphylococcus lugdunensis NOT DETECTED NOT DETECTED   Streptococcus species NOT DETECTED NOT DETECTED   Streptococcus agalactiae NOT DETECTED NOT DETECTED   Streptococcus pneumoniae NOT DETECTED NOT DETECTED   Streptococcus pyogenes NOT DETECTED NOT DETECTED   A.calcoaceticus-baumannii NOT DETECTED NOT DETECTED   Bacteroides fragilis NOT DETECTED NOT DETECTED   Enterobacterales NOT DETECTED NOT DETECTED    Enterobacter cloacae complex NOT DETECTED NOT DETECTED   Escherichia coli NOT DETECTED NOT DETECTED   Klebsiella aerogenes NOT DETECTED NOT DETECTED   Klebsiella oxytoca NOT DETECTED NOT DETECTED   Klebsiella pneumoniae NOT DETECTED NOT DETECTED   Proteus species NOT DETECTED NOT DETECTED   Salmonella species NOT DETECTED NOT DETECTED   Serratia marcescens NOT DETECTED NOT DETECTED   Haemophilus influenzae NOT DETECTED NOT DETECTED   Neisseria meningitidis NOT DETECTED NOT DETECTED   Pseudomonas aeruginosa NOT DETECTED NOT DETECTED   Stenotrophomonas maltophilia NOT DETECTED NOT DETECTED   Candida albicans NOT DETECTED NOT DETECTED   Candida auris NOT DETECTED NOT DETECTED   Candida glabrata NOT DETECTED NOT DETECTED   Candida krusei NOT DETECTED NOT DETECTED   Candida parapsilosis NOT DETECTED NOT DETECTED   Candida tropicalis NOT DETECTED NOT DETECTED   Cryptococcus neoformans/gattii NOT DETECTED NOT DETECTED   Vancomycin resistance NOT DETECTED NOT DETECTED    Thank you for allowing pharmacy to be a part of this patient's care.  Alycia Rossetti, PharmD, BCPS Infectious Diseases Clinical Pharmacist 05/17/2022 1:07 PM   **Pharmacist phone directory can now be found on [email or web address] ([email or web address]).  Listed under [place].

## 2022-05-17 NOTE — Progress Notes (Signed)
Patient is more lethargic than yesterday, sleeping intermittently. Was not able to stay awake to eat breakfast.  Placed back on BiPAP.

## 2022-05-17 NOTE — Consult Note (Addendum)
[place] for Infectious Disease    Date of Admission:  05/17/2022     Reason for Consult: E faecalis bacteremia     Referring Physician: CHAMP [facility] Consult  Current antibiotics: Ceftriaxone Remdesivir  ASSESSMENT:    88 y.o. male admitted with:  # Recurrent E faecalis bacteremia Patient admitted in October where he was diagnosed with rectal adenocarcinoma.  Developed bacteremia during that admit shortly after colonoscopy with E faecalis that cleared quickly.  Completed a 14-day course of antibiotics ~ 05/10/22 but now admitted with COVID and found to have recurrent bacteremia with same organism raising concern for IE in the setting of prior MV surgery.  # COVID 19 pneumonia Diagnosed two days prior to admission.  Currently requiring supplemental oxygen.  Managed by [facility].  # Hx of mitral valve repair.  ? 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  # Recently diagnosed rectal adenocarcinoma This was diagnosed during admission last month.  Following with oncology as outpatient but yet to undergo treatment.  # Hx of renal cell carcinoma Status post left nephrectomy July 2023.  RECOMMENDATIONS:    Continue ceftriaxone but increase the dose Add ampicillin for dual therapy given concern for endocarditis at this time Repeat blood cultures tomorrow morning Would recommend TEE to exclude endocarditis given the recurrence and prior MV surgery.  Will discuss with TR[facility] regarding utility of repeating limited TTE first Will follow.  Dr Baxter Flattery is available as needed this weekend.  I'll be back Monday.   Principal Problem:   COVID-19 Active Problems:   Acute metabolic encephalopathy   Acute on chronic respiratory failure with hypoxia and hypercapnia (HCC)   Protein-calorie malnutrition, severe   MEDICATIONS:    Scheduled Meds:  Chlorhexidine Gluconate Cloth  6 each Topical Daily   Chlorhexidine  Gluconate Cloth  6 each Topical Q0600   docusate sodium  100 mg Oral BID   enoxaparin (LOVENOX) injection  40 mg Subcutaneous Q24H   feeding supplement  237 mL Oral BID BM   [START ON 05/18/2022] ferrous sulfate  325 mg Oral Q breakfast   finasteride  5 mg Oral Daily   furosemide  40 mg Intravenous Daily   multivitamin with minerals  1 tablet Oral Daily   mupirocin ointment  1 Application Nasal BID   mouth rinse  15 mL Mouth Rinse 4 times per day   pantoprazole  40 mg Oral BID   predniSONE  10 mg Oral QAC breakfast   pyridostigmine  60 mg Oral Q8H   tamsulosin  0.4 mg Oral Daily   Continuous Infusions:  cefTRIAXone (ROCEPHIN)  IV Stopped (05/16/22 1652)   famotidine (PEPCID) IV Stopped (05/17/22 0948)   PRN Meds:.acetaminophen, ipratropium-albuterol, ondansetron **OR** ondansetron (ZOFRAN) IV, mouth rinse  HPI:    Daniel Sharp is a 88 y.o. male with complicated PMHx here with respiratory failure from his nursing home in the setting of COVID diagnosis 2 days prior to admission.  He is admitted here 05/20/2022 due to this process.  There was also concern for superimposed bacterial pneumonia.  He has been managed with ceftriaxone and remdesivir. He is on chronic prednisone for his MG.  Patient had blood cultures obtained at admission.  These are growing E faecalis in 1 of 4 bottles.  During a recent admission, patient also had E faecalis bacteremia from 10/18 blood cultures in 2 of 2 bottles from separate sites.  Repeat blood cx 04/26/22 were negative.  Patient was seen by ID during that admission.  TTE did not show vegetation but patient does have history of MV repair in 2006.  At that time, it was suspected the bacteremia was due to rectal adenocarcinoma that had been diagnosed during the admission (admitted 10/5 with melanotic stool; colonoscopy 10/13 with rectal mass; bacteremia 10/18).  TEE was deferred.  He was discharged on 14 day course of antibiotics to finish 05/10/22.  As noted above, he is  now back with Covid and recurrent bacteremia.  Possibly due to GI source but raises concerns for endocarditis.  He has been afebrile here and remains on nasal cannula with BiPap charted intermitently as well.   Patient saw Oncology 05/08/22.  He also was seen at [facility].  They were given several tx options per the note and at the time were holding off on surgery or chemo due to his feeling poorly.       Past Medical History:  Diagnosis Date   A-fib [facility])    history of   Arthritis    Dyspnea    H/O mitral valve repair 07/2003   Hypertension    Ocular myasthenia gravis ([place])    takes pred   Pneumonia     Social History   Tobacco Use   Smoking status: Former    Types: Cigarettes, Pipe, Cigars   Smokeless tobacco: Former    Types: Chew   Tobacco comments:    Uses nicotine gum   Vaping Use   Vaping Use: Never used  Substance Use Topics   Alcohol use: Yes    Alcohol/week: 7.0 standard drinks of alcohol    Types: 7 Glasses of wine per week    Comment: Drinks wine when sons will let him   Drug use: Never    Family History  Problem Relation Age of Onset   Other Mother        Gallbladder removed   Stroke Father        intracranial stroke   Stroke Brother    Colon cancer Neg Hx    Rectal cancer Neg Hx     Allergies  Allergen Reactions   Aminoglycosides     Hx of MG    Botox [Onabotulinumtoxina]     Hx of MG    Corticosteroids     Caution with high doses as Hx of MG    Hydroxychloroquine     Hx of MG    Levaquin [Levofloxacin]     Hx of MG  - avoid all Fluoroquinolones   Macrolides And Ketolides     Hx of MG    Magnesium-Containing Compounds     Use with caution as pt with Hx of MG    Neuromuscular Blocking Agents     Hx of MG    Penicillamine     Hx of MG    Procainamide     Hx of MG    Quinidine     Hx of MG      OBJECTIVE:   Blood pressure (!) 109/58, pulse (!) 58, temperature 98 F (36.7 C), temperature source Oral, resp. rate (!) 21, height '6\' 1"'$   (1.854 m), weight 88.9 kg, SpO2 100 %. Body mass index is 25.86 kg/m.   Lab Results: Lab Results  Component Value Date   WBC 8.1 06/05/2022   HGB 8.9 (L) 05/20/2022   HCT 31.5 (L) 05/14/2022   MCV 102.9 (H) 05/12/2022   PLT 158 06/02/2022    Lab Results  Component  Value Date   NA 148 (H) 05/17/2022   K 4.0 05/17/2022   CO2 36 (H) 05/17/2022   GLUCOSE 102 (H) 05/17/2022   BUN 58 (H) 05/17/2022   CREATININE 1.34 (H) 05/17/2022   CALCIUM 9.9 05/17/2022   GFRNONAA 51 (L) 05/17/2022    Lab Results  Component Value Date   ALT 13 05/17/2022   AST 15 05/17/2022   ALKPHOS 88 05/17/2022   BILITOT 0.6 05/17/2022       Component Value Date/Time   CRP 4.4 (H) 05/17/2022 0253    No results found for: "ESRSEDRATE"  I have reviewed the micro and lab results in Epic.  Imaging: DG CHEST PORT 1 VIEW  Result Date: 05/17/2022 CLINICAL DATA:  Productive cough.  COVID positive. EXAM: PORTABLE CHEST 1 VIEW COMPARISON:  Chest x-ray dated May 15, 2022. FINDINGS: Unchanged mild cardiomegaly status post CABG and mitral annulus repair. Consolidation in the right mid and lower lung is not significantly changed. Similar patchy airspace opacities in the left mid lung. Unchanged small bilateral pleural effusions. No pneumothorax. No acute osseous abnormality. IMPRESSION: 1. Unchanged multifocal pneumonia and small bilateral pleural effusions. Electronically Signed   By: Titus Dubin M.D.   On: 05/17/2022 09:35   DG Chest Portable 1 View  Result Date: 05/20/2022 CLINICAL DATA:  Recent diagnosis of COVID pneumonia 2 days ago. Cough. 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 Stable cardiomediastinal contours. Small right pleural effusion is suspected. Diffuse hazy opacities are noted throughout both lungs. More focal airspace consolidation within the right midlung and right lower lung is identified which appears  increased compared with the exam from 04/17/2022. IMPRESSION: 1. Diffuse hazy opacities throughout both lungs compatible with more focal airspace opacification within the right midlung and right lower lung compatible with pneumonia. 2. Suspect small right pleural effusion. Electronically Signed   By: Kerby Moors M.D.   On: 05/12/2022 14:22     Imaging independently reviewed in Epic.  Raynelle Highland for Infectious Disease [facility] [phone] pager 05/17/2022, 12:43 PM

## 2022-05-17 NOTE — Progress Notes (Addendum)
PROGRESS NOTE    Geoff Dacanay  YBF:[id number] DOB: 1932/08/30 DOA: 05/09/2022 PCP: [name], MD   Brief Narrative:  HPI: Daniel Sharp is a 88 y.o. male with medical history significant of hypertension, paroxysmal atrial fibrillation, chronic hypercapnic respiratory failure, BiPAP use at night, history of mitral valve repair, myasthenia gravis and many other comorbidities was brought into the emergency department from nursing home due to worsening shortness of breath and altered mental status.  Reportedly patient was tested positive for COVID-pneumonia 2 days ago on Monday.  He was not started on any medications.  Patient himself is on BiPAP since he arrived to the emergency department.  Unable to provide me any history.  History is obtained from the ED physician as well as from the son.   ED Course: Upon arrival to ED, he was hypoxic and hypercapnic, started on BiPAP.  BNP doubled from 4 weeks ago.  He received Rocephin as well as Lasix in the ED.  Initially PCCM was called for admission.  They evaluated the patient and had a discussion with the son and son decided to choose DNR for the patient and thus admission was deferred to hospitalist service.  PCCM will continue to follow.  Assessment & Plan:   Principal Problem:   COVID-19 Active Problems:   Acute metabolic encephalopathy   Acute on chronic respiratory failure with hypoxia and hypercapnia (HCC)   Protein-calorie malnutrition, severe  Acute on chronic respiratory failure with hypoxia and hypercapnia secondary to COVID-pneumonia as well as possible superimposed bacterial pneumonia/acute on chronic diastolic congestive heart failure: Patient does not meet sepsis cardiac sepsis ruled out.  Patient seems to have combination of COVID-pneumonia, bacterial pneumonia and acute on chronic diastolic congestive heart failure due to elevated BNP.  Awake and remains on 4 L oxygen, repeat chest x-ray per radiology read shows unchanged multifocal  pneumonia but per my review, the consolidation and infiltrates appear to be improved on the right side.  Continue remdesivir, antibiotics as well as IV Lasix.  Patient was encouraged to prone, out of bed to chair, to use incentive spirometry and flutter valve.  Acute metabolic encephalopathy: He was very confused and unable to speak yesterday and he was on BiPAP due to all of the above.  Remained alert and oriented since the morning of 05/16/2022.   BPH: Continue Flomax.   History of paroxysmal atrial fibrillation: Patient not on any rate control medications or any anticoagulation.    Hyperkalemia: Resolved.   CKD stage IIIa: At baseline.  Hypernatremia: Very mild and patient asymptomatic, monitor for now, hopefully this will improve her management.  DVT prophylaxis: enoxaparin (LOVENOX) injection 40 mg Start: 06/03/2022 2200   Code Status: DNR  Family Communication: son present at bedside.  Plan of care discussed with patient in length and he/she verbalized understanding and agreed with it.  Status is: Inpatient Remains inpatient appropriate because: Still very sick.   Estimated body mass index is 25.86 kg/m as calculated from the following:   Height as of this encounter: '6\' 1"'$  (1.854 m).   Weight as of this encounter: 88.9 kg.  Pressure Injury Coccyx Mid Deep Tissue Pressure Injury - Purple or maroon localized area of discolored intact skin or blood-filled blister due to damage of underlying soft tissue from pressure and/or shear. (Active)     Location: Coccyx  Location Orientation: Mid  Staging: Deep Tissue Pressure Injury - Purple or maroon localized area of discolored intact skin or blood-filled blister due to damage of underlying  soft tissue from pressure and/or shear.  Wound Description (Comments):   Present on Admission: Yes  Dressing Type Foam - Lift dressing to assess site every shift 05/16/22 0800     Pressure Injury 04/15/22 Heel Right Stage 1 -  Intact skin with  non-blanchable redness of a localized area usually over a bony prominence. (Active)  04/15/22 1105  Location: Heel  Location Orientation: Right  Staging: Stage 1 -  Intact skin with non-blanchable redness of a localized area usually over a bony prominence.  Wound Description (Comments):   Present on Admission: No  Dressing Type Foam - Lift dressing to assess site every shift 04/30/22 0841     Pressure Injury 05/28/2022 Sacrum Mid Stage 3 -  Full thickness tissue loss. Subcutaneous fat may be visible but bone, tendon or muscle are NOT exposed. (Active)  06/01/2022 2100  Location: Sacrum  Location Orientation: Mid  Staging: Stage 3 -  Full thickness tissue loss. Subcutaneous fat may be visible but bone, tendon or muscle are NOT exposed.  Wound Description (Comments):   Present on Admission: Yes  Dressing Type Foam - Lift dressing to assess site every shift 05/16/22 2000   Nutritional Assessment: Body mass index is 25.86 kg/m.Marland Kitchen Seen by dietician.  I agree with the assessment and plan as outlined below: Nutrition Status: Nutrition Problem: Severe Malnutrition Etiology: acute illness (covid) Signs/Symptoms: moderate fat depletion, moderate muscle depletion, edema (decreased intake but unable to provide details) Percent weight loss:  (in 3 months) Interventions: Boost Breeze, Ensure Enlive (each supplement provides 350kcal and 20 grams of protein), Liberalize Diet, Other (Comment) (diet advancement)  . Skin Assessment: I have examined the patient's skin and I agree with the wound assessment as performed by the wound care RN as outlined below: Pressure Injury Coccyx Mid Deep Tissue Pressure Injury - Purple or maroon localized area of discolored intact skin or blood-filled blister due to damage of underlying soft tissue from pressure and/or shear. (Active)     Location: Coccyx  Location Orientation: Mid  Staging: Deep Tissue Pressure Injury - Purple or maroon localized area of discolored  intact skin or blood-filled blister due to damage of underlying soft tissue from pressure and/or shear.  Wound Description (Comments):   Present on Admission: Yes  Dressing Type Foam - Lift dressing to assess site every shift 05/16/22 0800     Pressure Injury 04/15/22 Heel Right Stage 1 -  Intact skin with non-blanchable redness of a localized area usually over a bony prominence. (Active)  04/15/22 1105  Location: Heel  Location Orientation: Right  Staging: Stage 1 -  Intact skin with non-blanchable redness of a localized area usually over a bony prominence.  Wound Description (Comments):   Present on Admission: No  Dressing Type Foam - Lift dressing to assess site every shift 04/30/22 0841     Pressure Injury 06/06/2022 Sacrum Mid Stage 3 -  Full thickness tissue loss. Subcutaneous fat may be visible but bone, tendon or muscle are NOT exposed. (Active)  05/12/2022 2100  Location: Sacrum  Location Orientation: Mid  Staging: Stage 3 -  Full thickness tissue loss. Subcutaneous fat may be visible but bone, tendon or muscle are NOT exposed.  Wound Description (Comments):   Present on Admission: Yes  Dressing Type Foam - Lift dressing to assess site every shift 05/16/22 2000    Consultants:  PCCM  Procedures:  None  Antimicrobials:  Anti-infectives (From admission, onward)    Start     Dose/Rate Route Frequency  Ordered Stop   05/16/22 1500  cefTRIAXone (ROCEPHIN) 1 g in sodium chloride 0.9 % 100 mL IVPB        1 g 200 mL/hr over 30 Minutes Intravenous Every 24 hours 05/22/2022 1719 05/20/22 1459   05/16/22 1000  remdesivir 100 mg in sodium chloride 0.9 % 100 mL IVPB       See Hyperspace for full Linked Orders Report.   100 mg 200 mL/hr over 30 Minutes Intravenous Daily 05/29/2022 1741 05/18/22 0959   06/03/2022 1915  remdesivir 200 mg in sodium chloride 0.9% 250 mL IVPB       See Hyperspace for full Linked Orders Report.   200 mg 580 mL/hr over 30 Minutes Intravenous Once 06/01/2022 1741  05/20/2022 2300   06/05/2022 1500  cefTRIAXone (ROCEPHIN) 1 g in sodium chloride 0.9 % 100 mL IVPB        1 g 200 mL/hr over 30 Minutes Intravenous  Once 05/14/2022 1457 05/23/2022 1836         Subjective:  Patient seen and examined.  He is eating his breakfast.  He appears comfortable.  He denies any complaint.  Objective: Vitals:   05/17/22 0600 05/17/22 0700 05/17/22 0800 05/17/22 0821  BP: (!) 128/49  (!) 126/59   Pulse: (!) 110 (!) 101 (!) 105   Resp: 20 (!) 23 17   Temp:   98.2 F (36.8 C) 98 F (36.7 C)  TempSrc:   Oral   SpO2: 100% 100% 100%   Weight:      Height:        Intake/Output Summary (Last 24 hours) at 05/17/2022 0943 Last data filed at 05/17/2022 0437 Gross per 24 hour  Intake 1066.72 ml  Output 2300 ml  Net -1233.28 ml    Filed Weights   05/17/22 0436  Weight: 88.9 kg    Examination:  General exam: Appears calm and comfortable  Respiratory system: Rhonchi and basal crackles bilaterally. Respiratory effort normal. Cardiovascular system: S1 & S2 heard, RRR. No JVD, murmurs, rubs, gallops or clicks.  +3 pitting edema bilateral lower extremity. Gastrointestinal system: Abdomen is nondistended, soft and nontender. No organomegaly or masses felt. Normal bowel sounds heard. Central nervous system: Alert and oriented. No focal neurological deficits. Extremities: Symmetric 5 x 5 power. Skin: No rashes, lesions or ulcers.   Data Reviewed: I have personally reviewed following labs and imaging studies  CBC: Recent Labs  Lab 05/26/2022 1415 05/25/2022 1850  WBC 10.9* 8.1  NEUTROABS 9.9*  --   HGB 9.9* 8.9*  HCT 34.7* 31.5*  MCV 103.0* 102.9*  PLT 164 937    Basic Metabolic Panel: Recent Labs  Lab 05/20/2022 1415 06/04/2022 1850 05/16/22 0323 05/17/22 0253  NA 145  --  148* 148*  K 5.3*  --  4.2 4.0  CL 107  --  108 106  CO2 32  --  33* 36*  GLUCOSE 111*  --  80 102*  BUN 56*  --  63* 58*  CREATININE 1.21 0.99 1.27* 1.34*  CALCIUM 10.8*  --  10.6*  9.9  MG  --   --  2.2 2.2  PHOS  --   --  1.7* 3.6    GFR: Estimated Creatinine Clearance: 43.9 mL/min (A) (by C-G formula based on SCr of 1.34 mg/dL (H)). Liver Function Tests: Recent Labs  Lab 05/16/2022 1415 05/16/22 0323 05/17/22 0253  AST 12* 11* 15  ALT '15 13 13  '$ ALKPHOS 97 81 88  BILITOT 0.8 0.8 0.6  PROT 7.0 6.1* 6.1*  ALBUMIN 2.9* 2.6* 2.5*    No results for input(s): "LIPASE", "AMYLASE" in the last 168 hours. No results for input(s): "AMMONIA" in the last 168 hours. Coagulation Profile: No results for input(s): "INR", "PROTIME" in the last 168 hours. Cardiac Enzymes: No results for input(s): "CKTOTAL", "CKMB", "CKMBINDEX", "TROPONINI" in the last 168 hours. BNP (last 3 results) No results for input(s): "PROBNP" in the last 8760 hours. HbA1C: No results for input(s): "HGBA1C" in the last 72 hours. CBG: Recent Labs  Lab 05/16/22 1339  GLUCAP 84   Lipid Profile: No results for input(s): "CHOL", "HDL", "LDLCALC", "TRIG", "CHOLHDL", "LDLDIRECT" in the last 72 hours. Thyroid Function Tests: No results for input(s): "TSH", "T4TOTAL", "FREET4", "T3FREE", "THYROIDAB" in the last 72 hours. Anemia Panel: Recent Labs    05/16/22 0323 05/17/22 0253  FERRITIN 719* 854*    Sepsis Labs: Recent Labs  Lab 05/23/2022 1415 06/02/2022 1545 06/03/2022 1850  PROCALCITON  --   --  <0.10  LATICACIDVEN 1.0 1.1  --      Recent Results (from the past 240 hour(s))  Blood culture (routine x 2)     Status: None (Preliminary result)   Collection Time: 05/23/2022  2:15 PM   Specimen: BLOOD  Result Value Ref Range Status   Specimen Description   Final    BLOOD BLOOD RIGHT HAND Performed at [facility] [address], [place], [place] [address]    Special Requests   Final    BOTTLES DRAWN AEROBIC AND ANAEROBIC Blood Culture results may not be optimal due to an inadequate volume of blood received in culture bottles Performed at [facility] [address] [place], [place] [address]    Culture   Final    NO GROWTH 2 DAYS Performed at [facility] [address], [place], [place] [address]    Report Status PENDING  Incomplete  Blood culture (routine x 2)     Status: None (Preliminary result)   Collection Time: 05/12/2022  2:15 PM   Specimen: BLOOD  Result Value Ref Range Status   Specimen Description   Final    BLOOD BLOOD RIGHT ARM Performed at [facility] [address] [place], [place] [address]    Special Requests   Final    BOTTLES DRAWN AEROBIC AND ANAEROBIC Blood Culture results may not be optimal due to an inadequate volume of blood received in culture bottles Performed at [facility] [address], [place], [place] [address]    Culture   Final    NO GROWTH 2 DAYS Performed at [facility] [address] [place], [place] [address]    Report Status PENDING  Incomplete  Resp Panel by RT-PCR (Flu A&B, Covid) Peripheral     Status: Abnormal   Collection Time: 06/02/2022  2:15 PM   Specimen: Peripheral; Nasal Swab  Result Value Ref Range Status   SARS Coronavirus 2 by RT PCR POSITIVE (A) NEGATIVE Final    Comment: (NOTE) SARS-CoV-2 target nucleic acids are DETECTED.  The SARS-CoV-2 RNA is generally detectable in upper respiratory specimens during the acute phase of infection. Positive results are indicative of the presence of the identified virus, but do not rule out bacterial infection or co-infection with other pathogens not detected by the test. Clinical correlation with patient history and other diagnostic information is necessary to determine patient infection status. The expected result is Negative.  Fact Sheet for  Patients: EntrepreneurPulse.com.au  Fact Sheet for Healthcare Providers: IncredibleEmployment.be  This test is not yet approved or cleared by the [place] FDA and  has been authorized for  detection and/or diagnosis of SARS-CoV-2 by FDA under an Emergency Use Authorization (EUA).  This EUA will remain in effect (meaning this test can be used) for the duration of  the COVID-19 declaration under Section 564(b)(1) of the A ct, 21 U.S.C. section 360bbb-3(b)(1), unless the authorization is terminated or revoked sooner.     Influenza A by PCR NEGATIVE NEGATIVE Final   Influenza B by PCR NEGATIVE NEGATIVE Final    Comment: (NOTE) The Xpert Xpress SARS-CoV-2/FLU/RSV plus assay is intended as an aid in the diagnosis of influenza from Nasopharyngeal swab specimens and should not be used as a sole basis for treatment. Nasal washings and aspirates are unacceptable for Xpert Xpress SARS-CoV-2/FLU/RSV testing.  Fact Sheet for Patients: EntrepreneurPulse.com.au  Fact Sheet for Healthcare Providers: IncredibleEmployment.be  This test is not yet approved or cleared by the [place] FDA and has been authorized for detection and/or diagnosis of SARS-CoV-2 by FDA under an Emergency Use Authorization (EUA). This EUA will remain in effect (meaning this test can be used) for the duration of the COVID-19 declaration under Section 564(b)(1) of the Act, 21 U.S.C. section 360bbb-3(b)(1), unless the authorization is terminated or revoked.  Performed at [facility], [place] [address], [place], [place] [address]   Respiratory (~20 pathogens) panel by PCR     Status: None   Collection Time: 05/31/2022  2:50 PM   Specimen: Nasopharyngeal Swab; Respiratory  Result Value Ref Range Status   Adenovirus NOT DETECTED NOT DETECTED Final   Coronavirus 229E NOT DETECTED NOT DETECTED Final    Comment: (NOTE) The Coronavirus on the Respiratory Panel, DOES NOT test for the novel  Coronavirus (2019 nCoV)    Coronavirus HKU1 NOT DETECTED NOT DETECTED Final   Coronavirus NL63 NOT DETECTED NOT DETECTED Final   Coronavirus OC43 NOT DETECTED NOT  DETECTED Final   Metapneumovirus NOT DETECTED NOT DETECTED Final   Rhinovirus / Enterovirus NOT DETECTED NOT DETECTED Final   Influenza A NOT DETECTED NOT DETECTED Final   Influenza B NOT DETECTED NOT DETECTED Final   Parainfluenza Virus 1 NOT DETECTED NOT DETECTED Final   Parainfluenza Virus 2 NOT DETECTED NOT DETECTED Final   Parainfluenza Virus 3 NOT DETECTED NOT DETECTED Final   Parainfluenza Virus 4 NOT DETECTED NOT DETECTED Final   Respiratory Syncytial Virus NOT DETECTED NOT DETECTED Final   Bordetella pertussis NOT DETECTED NOT DETECTED Final   Bordetella Parapertussis NOT DETECTED NOT DETECTED Final   Chlamydophila pneumoniae NOT DETECTED NOT DETECTED Final   Mycoplasma pneumoniae NOT DETECTED NOT DETECTED Final    Comment: Performed at [facility], [place]. [address] [place], [place] [address]  MRSA Next Gen by PCR, Nasal     Status: Abnormal   Collection Time: 05/29/2022  8:26 PM   Specimen: Nasal Mucosa; Nasal Swab  Result Value Ref Range Status   MRSA by PCR Next Gen DETECTED (A) NOT DETECTED Final    Comment: (NOTE) The GeneXpert MRSA Assay (FDA approved for NASAL specimens only), is one component of a comprehensive MRSA colonization surveillance program. It is not intended to diagnose MRSA infection nor to guide or monitor treatment for MRSA infections. Test performance is not FDA approved in patients less than 8 years old. Performed at [facility], [place] [address], [place], [place] [address]  Radiology Studies: DG CHEST PORT 1 VIEW  Result Date: 05/17/2022 CLINICAL DATA:  Productive cough.  COVID positive. EXAM: PORTABLE CHEST 1 VIEW COMPARISON:  Chest x-ray dated May 15, 2022. FINDINGS: Unchanged mild cardiomegaly status post CABG and mitral annulus repair. Consolidation in the right mid and lower lung is not significantly changed. Similar patchy airspace opacities in the left mid lung. Unchanged small bilateral pleural effusions. No  pneumothorax. No acute osseous abnormality. IMPRESSION: 1. Unchanged multifocal pneumonia and small bilateral pleural effusions. Electronically Signed   By: Titus Dubin M.D.   On: 05/17/2022 09:35   DG Chest Portable 1 View  Result Date: 05/29/2022 CLINICAL DATA:  Recent diagnosis of COVID pneumonia 2 days ago. Cough. 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 Stable cardiomediastinal contours. Small right pleural effusion is suspected. Diffuse hazy opacities are noted throughout both lungs. More focal airspace consolidation within the right midlung and right lower lung is identified which appears increased compared with the exam from 04/17/2022. IMPRESSION: 1. Diffuse hazy opacities throughout both lungs compatible with more focal airspace opacification within the right midlung and right lower lung compatible with pneumonia. 2. Suspect small right pleural effusion. Electronically Signed   By: Kerby Moors M.D.   On: 05/14/2022 14:22    Scheduled Meds:  Chlorhexidine Gluconate Cloth  6 each Topical Daily   enoxaparin (LOVENOX) injection  40 mg Subcutaneous Q24H   feeding supplement  237 mL Oral BID BM   finasteride  5 mg Oral Daily   furosemide  40 mg Intravenous Daily   multivitamin with minerals  1 tablet Oral Daily   mouth rinse  15 mL Mouth Rinse 4 times per day   tamsulosin  0.4 mg Oral Daily   Continuous Infusions:  cefTRIAXone (ROCEPHIN)  IV Stopped (05/16/22 1652)   famotidine (PEPCID) IV 20 mg (05/17/22 0917)   remdesivir 100 mg in sodium chloride 0.9 % 100 mL IVPB Stopped (05/16/22 1247)     LOS: 2 days   Darliss Cheney, MD [facility]  05/17/2022, 9:43 AM   *Please note that this is a verbal dictation therefore any spelling or grammatical errors are due to the "[place] [facility]" system interpretation.  Please page via [facility] [place] and do not message via secure chat for urgent patient care  matters. Secure chat can be used for non urgent patient care matters.  How to contact the [facility] Attending or Consulting provider [place] or covering provider during after hours [place], for this patient?  Check the care team in [facility] and look for a) attending/consulting TRH provider listed and b) the [facility] team listed. Page or secure chat [phone]. Log into [email or web address] and use [place] universal password to access. If you do not have the password, please contact the hospital operator. Locate the [facility] provider you are looking for under [facility] and page to a number that you can be directly reached. If you still have difficulty reaching the provider, please page the [facility] (Director on Call) for the Hospitalists listed on amion for assistance.

## 2022-05-17 NOTE — TOC Progression Note (Addendum)
Transition of Care [facility]) - Progression Note    Patient Details  Name: Randol Zumstein MRN: [id number] Date of Birth: [birth year]  [facility] CM/SW Contact  Henrietta Dine, RN Phone Number: 05/17/2022, 9:42 AM  Clinical Narrative:    Contacted by Candi Leash from [facility]; she says the agency is contracted with [facility];and they were scheduled to see the pt; however he was hospitalized before a formal meeting could be held; Hoyle Sauer says they will con't to follow the pt; she can be contacted at [phone].     Barriers to Discharge: Continued Medical Work up  Expected Discharge Plan and Services                                                 Social Determinants of Health (SDOH) Interventions    Readmission Risk Interventions     No data to display

## 2022-05-18 ENCOUNTER — Inpatient Hospital Stay (HOSPITAL_COMMUNITY): Payer: Medicare HMO

## 2022-05-18 DIAGNOSIS — R7881 Bacteremia: Secondary | ICD-10-CM

## 2022-05-18 DIAGNOSIS — A498 Other bacterial infections of unspecified site: Secondary | ICD-10-CM | POA: Diagnosis not present

## 2022-05-18 LAB — ECHOCARDIOGRAM LIMITED
Calc EF: 43.9 %
Height: 73 in
MV M vel: 4.44 m/s
MV Peak grad: 78.9 mmHg
Single Plane A2C EF: 45.8 %
Single Plane A4C EF: 45.6 %
Weight: 3082.91 oz

## 2022-05-18 LAB — C-REACTIVE PROTEIN: CRP: 6.4 mg/dL — ABNORMAL HIGH (ref <1.0)

## 2022-05-18 LAB — CBC WITH DIFFERENTIAL/PLATELET
Abs Immature Granulocytes: 0.02 10*3/uL (ref 0.00–0.07)
Basophils Absolute: 0 10*3/uL (ref 0.0–0.1)
Basophils Relative: 0 %
Eosinophils Absolute: 0 10*3/uL (ref 0.0–0.5)
Eosinophils Relative: 0 %
HCT: 28.2 % — ABNORMAL LOW (ref 39.0–52.0)
Hemoglobin: 8.2 g/dL — ABNORMAL LOW (ref 13.0–17.0)
Immature Granulocytes: 0 %
Lymphocytes Relative: 12 %
Lymphs Abs: 0.8 10*3/uL (ref 0.7–4.0)
MCH: 29.4 pg (ref 26.0–34.0)
MCHC: 29.1 g/dL — ABNORMAL LOW (ref 30.0–36.0)
MCV: 101.1 fL — ABNORMAL HIGH (ref 80.0–100.0)
Monocytes Absolute: 0.5 10*3/uL (ref 0.1–1.0)
Monocytes Relative: 7 %
Neutro Abs: 5.5 10*3/uL (ref 1.7–7.7)
Neutrophils Relative %: 81 %
Platelets: 107 10*3/uL — ABNORMAL LOW (ref 150–400)
RBC: 2.79 MIL/uL — ABNORMAL LOW (ref 4.22–5.81)
RDW: 16 % — ABNORMAL HIGH (ref 11.5–15.5)
WBC: 6.8 10*3/uL (ref 4.0–10.5)
nRBC: 0 % (ref 0.0–0.2)

## 2022-05-18 LAB — BLOOD GAS, ARTERIAL
Acid-Base Excess: 10.5 mmol/L — ABNORMAL HIGH (ref 0.0–2.0)
Bicarbonate: 37.8 mmol/L — ABNORMAL HIGH (ref 20.0–28.0)
Drawn by: 331471
O2 Saturation: 99.9 %
Patient temperature: 36
pCO2 arterial: 58 mmHg — ABNORMAL HIGH (ref 32–48)
pH, Arterial: 7.41 (ref 7.35–7.45)
pO2, Arterial: 148 mmHg — ABNORMAL HIGH (ref 83–108)

## 2022-05-18 LAB — COMPREHENSIVE METABOLIC PANEL
ALT: 11 U/L (ref 0–44)
AST: 11 U/L — ABNORMAL LOW (ref 15–41)
Albumin: 2.3 g/dL — ABNORMAL LOW (ref 3.5–5.0)
Alkaline Phosphatase: 70 U/L (ref 38–126)
Anion gap: 8 (ref 5–15)
BUN: 46 mg/dL — ABNORMAL HIGH (ref 8–23)
CO2: 33 mmol/L — ABNORMAL HIGH (ref 22–32)
Calcium: 9.7 mg/dL (ref 8.9–10.3)
Chloride: 108 mmol/L (ref 98–111)
Creatinine, Ser: 1.16 mg/dL (ref 0.61–1.24)
GFR, Estimated: >60 mL/min (ref >60)
Glucose, Bld: 100 mg/dL — ABNORMAL HIGH (ref 70–99)
Potassium: 4.1 mmol/L (ref 3.5–5.1)
Sodium: 149 mmol/L — ABNORMAL HIGH (ref 135–145)
Total Bilirubin: 0.5 mg/dL (ref 0.3–1.2)
Total Protein: 5.8 g/dL — ABNORMAL LOW (ref 6.5–8.1)

## 2022-05-18 LAB — MAGNESIUM: Magnesium: 2 mg/dL (ref 1.7–2.4)

## 2022-05-18 LAB — FERRITIN: Ferritin: 878 ng/mL — ABNORMAL HIGH (ref 24–336)

## 2022-05-18 LAB — PHOSPHORUS: Phosphorus: 2.1 mg/dL — ABNORMAL LOW (ref 2.5–4.6)

## 2022-05-18 MED ORDER — CHLORHEXIDINE GLUCONATE CLOTH 2 % EX PADS
6.0000 | MEDICATED_PAD | Freq: Every day | CUTANEOUS | Status: DC
  Administered 2022-05-19 – 2022-05-29 (×11): 6 via TOPICAL

## 2022-05-18 MED ORDER — ALPRAZOLAM 0.25 MG PO TABS
0.2500 mg | ORAL_TABLET | ORAL | Status: AC
  Administered 2022-05-18: 0.25 mg via ORAL
  Filled 2022-05-18: qty 1

## 2022-05-18 NOTE — Progress Notes (Signed)
Patient was anxious and kept taking off his BIPAP. This RN for several times placed the BIPAP back on and explained to patient the importance of him to wear it. Patient still insisted to take it off and said he is uncomfortable and can't sleep. A.Chavez NP/[facility] was made aware.

## 2022-05-18 NOTE — Progress Notes (Signed)
Patient was given Xanax 0.25 mg and was able to tolerate BIPAP since 02:00.

## 2022-05-18 NOTE — Progress Notes (Signed)
  Echocardiogram 2D Echocardiogram has been performed.  Eartha Inch 05/18/2022, 8:51 AM

## 2022-05-18 NOTE — Progress Notes (Signed)
PROGRESS NOTE    Daniel Sharp  WUJ:[id number] DOB: 16-Oct-1932 DOA: 05/08/2022 PCP: [name], MD   Brief Narrative:  HPI: Daniel Sharp is a 88 y.o. male with medical history significant of hypertension, paroxysmal atrial fibrillation, chronic hypercapnic respiratory failure, BiPAP use at night, history of mitral valve repair, myasthenia gravis and many other comorbidities was brought into the emergency department from nursing home due to worsening shortness of breath and altered mental status.  Reportedly patient was tested positive for COVID-pneumonia 2 days ago on Monday.  He was not started on any medications.  Patient himself is on BiPAP since he arrived to the emergency department.  Unable to provide me any history.  History is obtained from the ED physician as well as from the son.   ED Course: Upon arrival to ED, he was hypoxic and hypercapnic, started on BiPAP.  BNP doubled from 4 weeks ago.  He received Rocephin as well as Lasix in the ED.  Initially PCCM was called for admission.  They evaluated the patient and had a discussion with the son and son decided to choose DNR for the patient and thus admission was deferred to hospitalist service.  PCCM will continue to follow.  Assessment & Plan:   Principal Problem:   Enterococcus faecalis infection Active Problems:   H/O mitral valve replacement   Acute metabolic encephalopathy   COVID-19   Acute on chronic respiratory failure with hypoxia and hypercapnia (HCC)   Protein-calorie malnutrition, severe  Acute on chronic respiratory failure with hypoxia and hypercapnia secondary to COVID-pneumonia as well as possible superimposed bacterial pneumonia/acute on chronic diastolic congestive heart failure: Patient does not meet sepsis criteria, sepsis ruled out.  Patient seems to have combination of COVID-pneumonia, bacterial pneumonia and acute on chronic diastolic congestive heart failure due to elevated BNP.  Continue remdesivir,  antibiotics as well as IV Lasix.  Patient currently requiring 2 L of oxygen. Patient was encouraged to prone, out of bed to chair, to use incentive spirometry and flutter valve.  Acute metabolic encephalopathy: He was very confused and unable to speak yesterday and he was on BiPAP due to all of the above. He has remained alert and oriented since the morning of 05/16/2022.  Recurrent E faecalis bacteremia: Patient was admitted in October when he was diagnosed with rectal adenocarcinoma and double bacteremia shortly after colonoscopy.  He completed 14 days of antibiotics.  His blood cultures growing Enterococcus fecaliths again.  ID has been consulted.  Rocephin was continued and ampicillin added on 05/17/2022.  ID recommends ruling out endocarditis.  TTE is ordered.  Based on that, patient might need TEE which will not happen over the weekend.  Family aware.   BPH: Continue Flomax.   History of paroxysmal atrial fibrillation: Patient not on any rate control medications or any anticoagulation.    Hyperkalemia: Resolved.   CKD stage IIIa: At baseline.  Hypernatremia: Very mild and patient asymptomatic, monitor for now, hopefully this will improve her management.  DVT prophylaxis: enoxaparin (LOVENOX) injection 40 mg Start: 05/09/2022 2200   Code Status: DNR  Family Communication: son and daughter-in-law present at bedside.  Plan of care discussed family.  Status is: Inpatient Remains inpatient appropriate because: Still very sick.   Estimated body mass index is 25.42 kg/m as calculated from the following:   Height as of this encounter: '6\' 1"'$  (1.854 m).   Weight as of this encounter: 87.4 kg.  Pressure Injury Coccyx Mid Deep Tissue Pressure Injury - Purple or  maroon localized area of discolored intact skin or blood-filled blister due to damage of underlying soft tissue from pressure and/or shear. (Active)     Location: Coccyx  Location Orientation: Mid  Staging: Deep Tissue Pressure Injury  - Purple or maroon localized area of discolored intact skin or blood-filled blister due to damage of underlying soft tissue from pressure and/or shear.  Wound Description (Comments):   Present on Admission: Yes  Dressing Type Foam - Lift dressing to assess site every shift 05/18/22 0215     Pressure Injury 04/15/22 Heel Right Stage 1 -  Intact skin with non-blanchable redness of a localized area usually over a bony prominence. (Active)  04/15/22 1105  Location: Heel  Location Orientation: Right  Staging: Stage 1 -  Intact skin with non-blanchable redness of a localized area usually over a bony prominence.  Wound Description (Comments):   Present on Admission: No  Dressing Type Foam - Lift dressing to assess site every shift 04/30/22 0841     Pressure Injury 05/25/2022 Sacrum Mid Stage 3 -  Full thickness tissue loss. Subcutaneous fat may be visible but bone, tendon or muscle are NOT exposed. (Active)  05/23/2022 2100  Location: Sacrum  Location Orientation: Mid  Staging: Stage 3 -  Full thickness tissue loss. Subcutaneous fat may be visible but bone, tendon or muscle are NOT exposed.  Wound Description (Comments):   Present on Admission: Yes  Dressing Type Foam - Lift dressing to assess site every shift 05/18/22 0215   Nutritional Assessment: Body mass index is 25.42 kg/m.Marland Kitchen Seen by dietician.  I agree with the assessment and plan as outlined below: Nutrition Status: Nutrition Problem: Severe Malnutrition Etiology: acute illness (covid) Signs/Symptoms: moderate fat depletion, moderate muscle depletion, edema (decreased intake but unable to provide details) Percent weight loss:  (in 3 months) Interventions: Boost Breeze, Ensure Enlive (each supplement provides 350kcal and 20 grams of protein), Liberalize Diet, Other (Comment) (diet advancement)  . Skin Assessment: I have examined the patient's skin and I agree with the wound assessment as performed by the wound care RN as outlined  below: Pressure Injury Coccyx Mid Deep Tissue Pressure Injury - Purple or maroon localized area of discolored intact skin or blood-filled blister due to damage of underlying soft tissue from pressure and/or shear. (Active)     Location: Coccyx  Location Orientation: Mid  Staging: Deep Tissue Pressure Injury - Purple or maroon localized area of discolored intact skin or blood-filled blister due to damage of underlying soft tissue from pressure and/or shear.  Wound Description (Comments):   Present on Admission: Yes  Dressing Type Foam - Lift dressing to assess site every shift 05/18/22 0215     Pressure Injury 04/15/22 Heel Right Stage 1 -  Intact skin with non-blanchable redness of a localized area usually over a bony prominence. (Active)  04/15/22 1105  Location: Heel  Location Orientation: Right  Staging: Stage 1 -  Intact skin with non-blanchable redness of a localized area usually over a bony prominence.  Wound Description (Comments):   Present on Admission: No  Dressing Type Foam - Lift dressing to assess site every shift 04/30/22 0841     Pressure Injury 06/04/2022 Sacrum Mid Stage 3 -  Full thickness tissue loss. Subcutaneous fat may be visible but bone, tendon or muscle are NOT exposed. (Active)  06/06/2022 2100  Location: Sacrum  Location Orientation: Mid  Staging: Stage 3 -  Full thickness tissue loss. Subcutaneous fat may be visible but bone, tendon or  muscle are NOT exposed.  Wound Description (Comments):   Present on Admission: Yes  Dressing Type Foam - Lift dressing to assess site every shift 05/18/22 0215    Consultants:  PCCM ID Procedures:  None  Antimicrobials:  Anti-infectives (From admission, onward)    Start     Dose/Rate Route Frequency Ordered Stop   05/17/22 1345  ampicillin (OMNIPEN) 2 g in sodium chloride 0.9 % 100 mL IVPB        2 g 300 mL/hr over 20 Minutes Intravenous Every 6 hours 05/17/22 1251     05/17/22 1345  cefTRIAXone (ROCEPHIN) 2 g in sodium  chloride 0.9 % 100 mL IVPB        2 g 200 mL/hr over 30 Minutes Intravenous Every 12 hours 05/17/22 1251     05/16/22 1500  cefTRIAXone (ROCEPHIN) 1 g in sodium chloride 0.9 % 100 mL IVPB  Status:  Discontinued        1 g 200 mL/hr over 30 Minutes Intravenous Every 24 hours 05/16/2022 1719 05/17/22 1251   05/16/22 1000  remdesivir 100 mg in sodium chloride 0.9 % 100 mL IVPB       See Hyperspace for full Linked Orders Report.   100 mg 200 mL/hr over 30 Minutes Intravenous Daily 06/06/2022 1741 05/17/22 1043   05/13/2022 1915  remdesivir 200 mg in sodium chloride 0.9% 250 mL IVPB       See Hyperspace for full Linked Orders Report.   200 mg 580 mL/hr over 30 Minutes Intravenous Once 05/26/2022 1741 06/06/2022 2300   05/11/2022 1500  cefTRIAXone (ROCEPHIN) 1 g in sodium chloride 0.9 % 100 mL IVPB        1 g 200 mL/hr over 30 Minutes Intravenous  Once 06/02/2022 1457 06/06/2022 1836         Subjective:  Patient seen and examined.  Patient was alert and oriented.  He had no complaints.  Family at the bedside.  Lengthy discussion with the family about plan of care.  Objective: Vitals:   05/18/22 0700 05/18/22 0800 05/18/22 0900 05/18/22 1000  BP: 102/62 (!) 115/53 134/60 129/60  Pulse: (!) 53 74 81 76  Resp: (!) 23 20 (!) 25 (!) 23  Temp:  97.9 F (36.6 C)    TempSrc:  Axillary    SpO2: 100% 97% 100% 100%  Weight:      Height:        Intake/Output Summary (Last 24 hours) at 05/18/2022 1058 Last data filed at 05/18/2022 2620 Gross per 24 hour  Intake 1245.2 ml  Output 850 ml  Net 395.2 ml    Filed Weights   05/17/22 0436 05/18/22 0500  Weight: 88.9 kg 87.4 kg    Examination:  General exam: Appears calm and comfortable  Respiratory system: Mild rhonchi at the bases, much better than yesterday, respiratory effort normal. Cardiovascular system: S1 & S2 heard, RRR. No JVD, murmurs, rubs, gallops or clicks.  +3 pitting edema bilateral lower extremity. Gastrointestinal system: Abdomen is  nondistended, soft and nontender. No organomegaly or masses felt. Normal bowel sounds heard. Central nervous system: Alert and oriented. No focal neurological deficits. Extremities: Symmetric 5 x 5 power. Skin: No rashes, lesions or ulcers.    Data Reviewed: I have personally reviewed following labs and imaging studies  CBC: Recent Labs  Lab 05/16/2022 1415 06/01/2022 1850 05/18/22 0711  WBC 10.9* 8.1 6.8  NEUTROABS 9.9*  --  5.5  HGB 9.9* 8.9* 8.2*  HCT 34.7* 31.5* 28.2*  MCV 103.0* 102.9* 101.1*  PLT 164 158 107*    Basic Metabolic Panel: Recent Labs  Lab 05/12/2022 1415 05/18/2022 1850 05/16/22 0323 05/17/22 0253  NA 145  --  148* 148*  K 5.3*  --  4.2 4.0  CL 107  --  108 106  CO2 32  --  33* 36*  GLUCOSE 111*  --  80 102*  BUN 56*  --  63* 58*  CREATININE 1.21 0.99 1.27* 1.34*  CALCIUM 10.8*  --  10.6* 9.9  MG  --   --  2.2 2.2  PHOS  --   --  1.7* 3.6    GFR: Estimated Creatinine Clearance: 43.9 mL/min (A) (by C-G formula based on SCr of 1.34 mg/dL (H)). Liver Function Tests: Recent Labs  Lab 05/18/2022 1415 05/16/22 0323 05/17/22 0253  AST 12* 11* 15  ALT '15 13 13  '$ ALKPHOS 97 81 88  BILITOT 0.8 0.8 0.6  PROT 7.0 6.1* 6.1*  ALBUMIN 2.9* 2.6* 2.5*    No results for input(s): "LIPASE", "AMYLASE" in the last 168 hours. No results for input(s): "AMMONIA" in the last 168 hours. Coagulation Profile: No results for input(s): "INR", "PROTIME" in the last 168 hours. Cardiac Enzymes: No results for input(s): "CKTOTAL", "CKMB", "CKMBINDEX", "TROPONINI" in the last 168 hours. BNP (last 3 results) No results for input(s): "PROBNP" in the last 8760 hours. HbA1C: No results for input(s): "HGBA1C" in the last 72 hours. CBG: Recent Labs  Lab 05/16/22 1339  GLUCAP 84    Lipid Profile: No results for input(s): "CHOL", "HDL", "LDLCALC", "TRIG", "CHOLHDL", "LDLDIRECT" in the last 72 hours. Thyroid Function Tests: No results for input(s): "TSH", "T4TOTAL", "FREET4",  "T3FREE", "THYROIDAB" in the last 72 hours. Anemia Panel: Recent Labs    05/17/22 0253 05/18/22 0711  FERRITIN 854* 878*    Sepsis Labs: Recent Labs  Lab 05/14/2022 1415 05/11/2022 1545 06/02/2022 1850  PROCALCITON  --   --  <0.10  LATICACIDVEN 1.0 1.1  --      Recent Results (from the past 240 hour(s))  Blood culture (routine x 2)     Status: None (Preliminary result)   Collection Time: 06/05/2022  2:15 PM   Specimen: BLOOD  Result Value Ref Range Status   Specimen Description   Final    BLOOD BLOOD RIGHT HAND Performed at [facility] [address], [place], [place] [address]    Special Requests   Final    BOTTLES DRAWN AEROBIC AND ANAEROBIC Blood Culture results may not be optimal due to an inadequate volume of blood received in culture bottles Performed at [facility] [address], [place], [place] [address]    Culture   Final    NO GROWTH 3 DAYS Performed at [facility], [place] [address], [place], [place] [address]    Report Status PENDING  Incomplete  Blood culture (routine x 2)     Status: Abnormal (Preliminary result)   Collection Time: 06/03/2022  2:15 PM   Specimen: BLOOD  Result Value Ref Range Status   Specimen Description   Final    BLOOD BLOOD RIGHT ARM Performed at [facility] [address], [place], [place] [address]    Special Requests   Final    BOTTLES DRAWN AEROBIC AND ANAEROBIC Blood Culture results may not be optimal due to an inadequate volume of blood received in culture bottles Performed at [facility] [address] [place], [place]  [address]    Culture  Setup Time   Final    GRAM POSITIVE COCCI IN CHAINS ANAEROBIC BOTTLE ONLY CRITICAL RESULT CALLED TO, READ BACK BY AND VERIFIED WITH: PHARMD CHRISTY [id number] AT 1107 BY EC CRITICAL RESULT CALLED TO, READ BACK BY AND VERIFIED WITH: PHARMD J LEGGE [id number] AT 34 AM B Y CM    Culture (A)  Final     ENTEROCOCCUS FAECALIS SUSCEPTIBILITIES TO FOLLOW Performed at [facility], [place] [address] [place], [place] [address]    Report Status PENDING  Incomplete  Resp Panel by RT-PCR (Flu A&B, Covid) Peripheral     Status: Abnormal   Collection Time: 05/31/2022  2:15 PM   Specimen: Peripheral; Nasal Swab  Result Value Ref Range Status   SARS Coronavirus 2 by RT PCR POSITIVE (A) NEGATIVE Final    Comment: (NOTE) SARS-CoV-2 target nucleic acids are DETECTED.  The SARS-CoV-2 RNA is generally detectable in upper respiratory specimens during the acute phase of infection. Positive results are indicative of the presence of the identified virus, but do not rule out bacterial infection or co-infection with other pathogens not detected by the test. Clinical correlation with patient history and other diagnostic information is necessary to determine patient infection status. The expected result is Negative.  Fact Sheet for Patients: EntrepreneurPulse.com.au  Fact Sheet for Healthcare Providers: IncredibleEmployment.be  This test is not yet approved or cleared by the [place] FDA and  has been authorized for detection and/or diagnosis of SARS-CoV-2 by FDA under an Emergency Use Authorization (EUA).  This EUA will remain in effect (meaning this test can be used) for the duration of  the COVID-19 declaration under Section 564(b)(1) of the A ct, 21 U.S.C. section 360bbb-3(b)(1), unless the authorization is terminated or revoked sooner.     Influenza A by PCR NEGATIVE NEGATIVE Final   Influenza B by PCR NEGATIVE NEGATIVE Final    Comment: (NOTE) The Xpert Xpress SARS-CoV-2/FLU/RSV plus assay is intended as an aid in the diagnosis of influenza from Nasopharyngeal swab specimens and should not be used as a sole basis for treatment. Nasal washings and aspirates are unacceptable for Xpert Xpress SARS-CoV-2/FLU/RSV testing.  Fact Sheet for  Patients: EntrepreneurPulse.com.au  Fact Sheet for Healthcare Providers: IncredibleEmployment.be  This test is not yet approved or cleared by the [place] FDA and has been authorized for detection and/or diagnosis of SARS-CoV-2 by FDA under an Emergency Use Authorization (EUA). This EUA will remain in effect (meaning this test can be used) for the duration of the COVID-19 declaration under Section 564(b)(1) of the Act, 21 U.S.C. section 360bbb-3(b)(1), unless the authorization is terminated or revoked.  Performed at [facility], [place] [address] [place], [place] [address]   Blood Culture ID Panel (Reflexed)     Status: Abnormal   Collection Time: 06/04/2022  2:15 PM  Result Value Ref Range Status   Enterococcus faecalis DETECTED (A) NOT DETECTED Final    Comment: CRITICAL RESULT CALLED TO, READ BACK BY AND VERIFIED WITH: PHARMD J LEGGE [id number] AT 1153 AM  BY CM    Enterococcus Faecium NOT DETECTED NOT DETECTED Final   Listeria monocytogenes NOT DETECTED NOT DETECTED Final   Staphylococcus species NOT DETECTED NOT DETECTED Final   Staphylococcus aureus (BCID) NOT DETECTED NOT DETECTED Final   Staphylococcus epidermidis NOT DETECTED NOT DETECTED Final   Staphylococcus lugdunensis NOT DETECTED NOT DETECTED Final   Streptococcus species NOT DETECTED NOT DETECTED Final   Streptococcus agalactiae NOT DETECTED  NOT DETECTED Final   Streptococcus pneumoniae NOT DETECTED NOT DETECTED Final   Streptococcus pyogenes NOT DETECTED NOT DETECTED Final   A.calcoaceticus-baumannii NOT DETECTED NOT DETECTED Final   Bacteroides fragilis NOT DETECTED NOT DETECTED Final   Enterobacterales NOT DETECTED NOT DETECTED Final   Enterobacter cloacae complex NOT DETECTED NOT DETECTED Final   Escherichia coli NOT DETECTED NOT DETECTED Final   Klebsiella aerogenes NOT DETECTED NOT DETECTED Final   Klebsiella oxytoca NOT DETECTED NOT DETECTED Final    Klebsiella pneumoniae NOT DETECTED NOT DETECTED Final   Proteus species NOT DETECTED NOT DETECTED Final   Salmonella species NOT DETECTED NOT DETECTED Final   Serratia marcescens NOT DETECTED NOT DETECTED Final   Haemophilus influenzae NOT DETECTED NOT DETECTED Final   Neisseria meningitidis NOT DETECTED NOT DETECTED Final   Pseudomonas aeruginosa NOT DETECTED NOT DETECTED Final   Stenotrophomonas maltophilia NOT DETECTED NOT DETECTED Final   Candida albicans NOT DETECTED NOT DETECTED Final   Candida auris NOT DETECTED NOT DETECTED Final   Candida glabrata NOT DETECTED NOT DETECTED Final   Candida krusei NOT DETECTED NOT DETECTED Final   Candida parapsilosis NOT DETECTED NOT DETECTED Final   Candida tropicalis NOT DETECTED NOT DETECTED Final   Cryptococcus neoformans/gattii NOT DETECTED NOT DETECTED Final   Vancomycin resistance NOT DETECTED NOT DETECTED Final    Comment: Performed at [facility], [address] [place], [place] [address]  Respiratory (~20 pathogens) panel by PCR     Status: None   Collection Time: 05/16/2022  2:50 PM   Specimen: Nasopharyngeal Swab; Respiratory  Result Value Ref Range Status   Adenovirus NOT DETECTED NOT DETECTED Final   Coronavirus 229E NOT DETECTED NOT DETECTED Final    Comment: (NOTE) The Coronavirus on the Respiratory Panel, DOES NOT test for the novel  Coronavirus (2019 nCoV)    Coronavirus HKU1 NOT DETECTED NOT DETECTED Final   Coronavirus NL63 NOT DETECTED NOT DETECTED Final   Coronavirus OC43 NOT DETECTED NOT DETECTED Final   Metapneumovirus NOT DETECTED NOT DETECTED Final   Rhinovirus / Enterovirus NOT DETECTED NOT DETECTED Final   Influenza A NOT DETECTED NOT DETECTED Final   Influenza B NOT DETECTED NOT DETECTED Final   Parainfluenza Virus 1 NOT DETECTED NOT DETECTED Final   Parainfluenza Virus 2 NOT DETECTED NOT DETECTED Final   Parainfluenza Virus 3 NOT DETECTED NOT DETECTED Final   Parainfluenza Virus 4 NOT DETECTED NOT  DETECTED Final   Respiratory Syncytial Virus NOT DETECTED NOT DETECTED Final   Bordetella pertussis NOT DETECTED NOT DETECTED Final   Bordetella Parapertussis NOT DETECTED NOT DETECTED Final   Chlamydophila pneumoniae NOT DETECTED NOT DETECTED Final   Mycoplasma pneumoniae NOT DETECTED NOT DETECTED Final    Comment: Performed at [facility], [place]. [address] [place], [place] [address]  MRSA Next Gen by PCR, Nasal     Status: Abnormal   Collection Time: 05/27/2022  8:26 PM   Specimen: Nasal Mucosa; Nasal Swab  Result Value Ref Range Status   MRSA by PCR Next Gen DETECTED (A) NOT DETECTED Final    Comment: (NOTE) The GeneXpert MRSA Assay (FDA approved for NASAL specimens only), is one component of a comprehensive MRSA colonization surveillance program. It is not intended to diagnose MRSA infection nor to guide or monitor treatment for MRSA infections. Test performance is not FDA approved in patients less than 40 years old. Performed at [facility], [place] [address], [place], [place] [address]  Culture, blood (Routine X 2) w Reflex to ID Panel     Status: None (Preliminary result)   Collection Time: 05/18/22  7:13 AM   Specimen: BLOOD LEFT ARM  Result Value Ref Range Status   Specimen Description   Final    BLOOD LEFT ARM AEROBIC BOTTLE ONLY Performed at [facility], [address] [place], [place] [address]    Special Requests   Final    NONE Performed at [facility], [place] [address], [place], [place] [address]    Culture PENDING  Incomplete   Report Status PENDING  Incomplete  Culture, blood (Routine X 2) w Reflex to ID Panel     Status: None (Preliminary result)   Collection Time: 05/18/22  7:13 AM   Specimen: BLOOD LEFT ARM  Result Value Ref Range Status   Specimen Description   Final    BLOOD LEFT ARM AEROBIC BOTTLE ONLY Performed at [facility], [place] [address] [place], [place] [address]    Special Requests   Final     NONE Performed at [facility], [place] [address], [place], [place] [address]    Culture PENDING  Incomplete   Report Status PENDING  Incomplete     Radiology Studies: DG CHEST PORT 1 VIEW  Result Date: 05/17/2022 CLINICAL DATA:  Productive cough.  COVID positive. EXAM: PORTABLE CHEST 1 VIEW COMPARISON:  Chest x-ray dated May 15, 2022. FINDINGS: Unchanged mild cardiomegaly status post CABG and mitral annulus repair. Consolidation in the right mid and lower lung is not significantly changed. Similar patchy airspace opacities in the left mid lung. Unchanged small bilateral pleural effusions. No pneumothorax. No acute osseous abnormality. IMPRESSION: 1. Unchanged multifocal pneumonia and small bilateral pleural effusions. Electronically Signed   By: Titus Dubin M.D.   On: 05/17/2022 09:35    Scheduled Meds:  Chlorhexidine Gluconate Cloth  6 each Topical Q0600   docusate sodium  100 mg Oral BID   enoxaparin (LOVENOX) injection  40 mg Subcutaneous Q24H   feeding supplement  237 mL Oral BID BM   ferrous sulfate  325 mg Oral Q breakfast   finasteride  5 mg Oral Daily   furosemide  40 mg Intravenous Daily   multivitamin with minerals  1 tablet Oral Daily   mupirocin ointment  1 Application Nasal BID   mouth rinse  15 mL Mouth Rinse 4 times per day   pantoprazole  40 mg Oral BID   predniSONE  10 mg Oral QAC breakfast   pyridostigmine  60 mg Oral Q8H   tamsulosin  0.4 mg Oral Daily   Continuous Infusions:  ampicillin (OMNIPEN) IV Stopped (05/18/22 3903)   cefTRIAXone (ROCEPHIN)  IV Stopped (05/17/22 2218)   famotidine (PEPCID) IV 20 mg (05/18/22 0914)     LOS: 3 days   Darliss Cheney, MD [facility]  05/18/2022, 10:58 AM   *Please note that this is a verbal dictation therefore any spelling or grammatical errors are due to the "[employer]" system interpretation.  Please page via Pandora and do not message via secure chat for urgent patient care  matters. Secure chat can be used for non urgent patient care matters.  How to contact the [facility] Attending or Consulting provider [place] or covering provider during after hours [place], for this patient?  Check the care team in [facility] and look for a) attending/consulting [facility] provider listed and b) the [facility] team listed. Page or secure  chat [phone]. Log into [email or web address] and use [place]'s universal password to access. If you do not have the password, please contact the hospital operator. Locate the [facility] provider you are looking for under [facility] and page to a number that you can be directly reached. If you still have difficulty reaching the provider, please page the [facility] (Director on Call) for the Hospitalists listed on amion for assistance.

## 2022-05-19 DIAGNOSIS — A498 Other bacterial infections of unspecified site: Secondary | ICD-10-CM | POA: Diagnosis not present

## 2022-05-19 LAB — COMPREHENSIVE METABOLIC PANEL
ALT: 14 U/L (ref 0–44)
AST: 13 U/L — ABNORMAL LOW (ref 15–41)
Albumin: 2.5 g/dL — ABNORMAL LOW (ref 3.5–5.0)
Alkaline Phosphatase: 76 U/L (ref 38–126)
Anion gap: 8 (ref 5–15)
BUN: 43 mg/dL — ABNORMAL HIGH (ref 8–23)
CO2: 35 mmol/L — ABNORMAL HIGH (ref 22–32)
Calcium: 9.7 mg/dL (ref 8.9–10.3)
Chloride: 103 mmol/L (ref 98–111)
Creatinine, Ser: 1.2 mg/dL (ref 0.61–1.24)
GFR, Estimated: 59 mL/min — ABNORMAL LOW (ref >60)
Glucose, Bld: 91 mg/dL (ref 70–99)
Potassium: 4 mmol/L (ref 3.5–5.1)
Sodium: 146 mmol/L — ABNORMAL HIGH (ref 135–145)
Total Bilirubin: 0.6 mg/dL (ref 0.3–1.2)
Total Protein: 6.4 g/dL — ABNORMAL LOW (ref 6.5–8.1)

## 2022-05-19 LAB — FERRITIN: Ferritin: 966 ng/mL — ABNORMAL HIGH (ref 24–336)

## 2022-05-19 LAB — CULTURE, BLOOD (ROUTINE X 2)

## 2022-05-19 LAB — MAGNESIUM: Magnesium: 2.3 mg/dL (ref 1.7–2.4)

## 2022-05-19 LAB — PHOSPHORUS: Phosphorus: 2.6 mg/dL (ref 2.5–4.6)

## 2022-05-19 LAB — C-REACTIVE PROTEIN: CRP: 5.3 mg/dL — ABNORMAL HIGH (ref <1.0)

## 2022-05-19 MED ORDER — FAMOTIDINE IN NACL 20-0.9 MG/50ML-% IV SOLN
20.0000 mg | INTRAVENOUS | Status: DC
  Administered 2022-05-20 – 2022-05-21 (×2): 20 mg via INTRAVENOUS
  Filled 2022-05-19 (×2): qty 50

## 2022-05-19 NOTE — TOC Progression Note (Signed)
Transition of Care [facility]) - Progression Note    Patient Details  Name: Daniel Sharp MRN: [id number] Date of Birth: 07/23/1932  Transition of Care [facility]) CM/SW Contact  Henrietta Dine, RN Phone Number: 05/19/2022, 12:02 PM  Clinical Narrative:    Notified by Toma Copier @ [facility] [place] received start date 05/19/22,  end date 05/22/2022;      Barriers to Discharge: Continued Medical Work up  Expected Discharge Plan and Services                                                 Social Determinants of Health (SDOH) Interventions    Readmission Risk Interventions     No data to display

## 2022-05-19 NOTE — Progress Notes (Signed)
Pt found off of bipap.

## 2022-05-19 NOTE — Progress Notes (Signed)
PROGRESS NOTE    Daniel Sharp  TMA:[id number] DOB: 1932/08/02 DOA: 05/22/2022 PCP: [name], MD   Brief Narrative:  HPI: Daniel Sharp is a 88 y.o. male with medical history significant of hypertension, paroxysmal atrial fibrillation, chronic hypercapnic respiratory failure, BiPAP use at night, history of mitral valve repair, myasthenia gravis and many other comorbidities was brought into the emergency department from nursing home due to worsening shortness of breath and altered mental status.  Reportedly patient was tested positive for COVID-pneumonia 2 days ago on Monday.  He was not started on any medications.  Patient himself is on BiPAP since he arrived to the emergency department.  Unable to provide me any history.  History is obtained from the ED physician as well as from the son.   ED Course: Upon arrival to ED, he was hypoxic and hypercapnic, started on BiPAP.  BNP doubled from 4 weeks ago.  He received Rocephin as well as Lasix in the ED.  Initially PCCM was called for admission.  They evaluated the patient and had a discussion with the son and son decided to choose DNR for the patient and thus admission was deferred to hospitalist service.  PCCM will continue to follow.  Assessment & Plan:   Principal Problem:   Enterococcus faecalis infection Active Problems:   H/O mitral valve replacement   Acute metabolic encephalopathy   COVID-19   Acute on chronic respiratory failure with hypoxia and hypercapnia (HCC)   Protein-calorie malnutrition, severe  Acute on chronic respiratory failure with hypoxia and hypercapnia secondary to COVID-pneumonia as well as possible superimposed bacterial pneumonia/acute on chronic diastolic congestive heart failure: Patient does not meet sepsis criteria, sepsis ruled out.  Patient seems to have combination of COVID-pneumonia, bacterial pneumonia and acute on chronic diastolic congestive heart failure due to elevated BNP.  Patient completed 5 days of  remdesivir.  We will continue current antibiotics as well as IV Lasix.  Patient is improving.  Currently requiring 1 to 2 L oxygen.   Acute metabolic encephalopathy: He was very confused and unable to speak yesterday and he was on BiPAP due to all of the above. He has remained alert and oriented since the morning of 05/16/2022.  Recurrent E faecalis bacteremia: Patient was admitted in October when he was diagnosed with rectal adenocarcinoma and double bacteremia shortly after colonoscopy.  He completed 14 days of antibiotics.  His blood cultures growing Enterococcus fecaliths again.  ID has been consulted.  Rocephin was continued and ampicillin added on 05/17/2022.  ID recommends ruling out endocarditis.  TTE was done which did not show any endocarditis but TEE is required.  I have reached out to cardiology.   BPH: Continue Flomax.   History of paroxysmal atrial fibrillation: Patient not on any rate control medications or any anticoagulation.    Hyperkalemia: Resolved.   CKD stage IIIa: At baseline.  Hypernatremia: Very mild and improving.  DVT prophylaxis: enoxaparin (LOVENOX) injection 40 mg Start: 05/31/2022 2200   Code Status: DNR  Family Communication: Daughter and granddaughter present at bedside.  Plan of care discussed with family.  Status is: Inpatient Remains inpatient appropriate because: Still very sick.   Estimated body mass index is 26.44 kg/m as calculated from the following:   Height as of this encounter: '6\' 1"'$  (1.854 m).   Weight as of this encounter: 90.9 kg.  Pressure Injury Coccyx Mid Deep Tissue Pressure Injury - Purple or maroon localized area of discolored intact skin or blood-filled blister due to damage  of underlying soft tissue from pressure and/or shear. (Active)     Location: Coccyx  Location Orientation: Mid  Staging: Deep Tissue Pressure Injury - Purple or maroon localized area of discolored intact skin or blood-filled blister due to damage of underlying  soft tissue from pressure and/or shear.  Wound Description (Comments):   Present on Admission: Yes  Dressing Type Foam - Lift dressing to assess site every shift 05/18/22 0215     Pressure Injury 04/15/22 Heel Right Stage 1 -  Intact skin with non-blanchable redness of a localized area usually over a bony prominence. (Active)  04/15/22 1105  Location: Heel  Location Orientation: Right  Staging: Stage 1 -  Intact skin with non-blanchable redness of a localized area usually over a bony prominence.  Wound Description (Comments):   Present on Admission: No  Dressing Type Foam - Lift dressing to assess site every shift 04/30/22 0841     Pressure Injury 05/25/2022 Sacrum Mid Stage 3 -  Full thickness tissue loss. Subcutaneous fat may be visible but bone, tendon or muscle are NOT exposed. (Active)  06/06/2022 2100  Location: Sacrum  Location Orientation: Mid  Staging: Stage 3 -  Full thickness tissue loss. Subcutaneous fat may be visible but bone, tendon or muscle are NOT exposed.  Wound Description (Comments):   Present on Admission: Yes  Dressing Type Foam - Lift dressing to assess site every shift 05/18/22 2100   Nutritional Assessment: Body mass index is 26.44 kg/m.Marland Kitchen Seen by dietician.  I agree with the assessment and plan as outlined below: Nutrition Status: Nutrition Problem: Severe Malnutrition Etiology: acute illness (covid) Signs/Symptoms: moderate fat depletion, moderate muscle depletion, edema (decreased intake but unable to provide details) Percent weight loss:  (in 3 months) Interventions: Boost Breeze, Ensure Enlive (each supplement provides 350kcal and 20 grams of protein), Liberalize Diet, Other (Comment) (diet advancement)  . Skin Assessment: I have examined the patient's skin and I agree with the wound assessment as performed by the wound care RN as outlined below: Pressure Injury Coccyx Mid Deep Tissue Pressure Injury - Purple or maroon localized area of discolored intact  skin or blood-filled blister due to damage of underlying soft tissue from pressure and/or shear. (Active)     Location: Coccyx  Location Orientation: Mid  Staging: Deep Tissue Pressure Injury - Purple or maroon localized area of discolored intact skin or blood-filled blister due to damage of underlying soft tissue from pressure and/or shear.  Wound Description (Comments):   Present on Admission: Yes  Dressing Type Foam - Lift dressing to assess site every shift 05/18/22 0215     Pressure Injury 04/15/22 Heel Right Stage 1 -  Intact skin with non-blanchable redness of a localized area usually over a bony prominence. (Active)  04/15/22 1105  Location: Heel  Location Orientation: Right  Staging: Stage 1 -  Intact skin with non-blanchable redness of a localized area usually over a bony prominence.  Wound Description (Comments):   Present on Admission: No  Dressing Type Foam - Lift dressing to assess site every shift 04/30/22 0841     Pressure Injury 06/03/2022 Sacrum Mid Stage 3 -  Full thickness tissue loss. Subcutaneous fat may be visible but bone, tendon or muscle are NOT exposed. (Active)  05/26/2022 2100  Location: Sacrum  Location Orientation: Mid  Staging: Stage 3 -  Full thickness tissue loss. Subcutaneous fat may be visible but bone, tendon or muscle are NOT exposed.  Wound Description (Comments):   Present on Admission:  Yes  Dressing Type Foam - Lift dressing to assess site every shift 05/18/22 2100    Consultants:  PCCM ID Procedures:  None  Antimicrobials:  Anti-infectives (From admission, onward)    Start     Dose/Rate Route Frequency Ordered Stop   05/17/22 1345  ampicillin (OMNIPEN) 2 g in sodium chloride 0.9 % 100 mL IVPB        2 g 300 mL/hr over 20 Minutes Intravenous Every 6 hours 05/17/22 1251     05/17/22 1345  cefTRIAXone (ROCEPHIN) 2 g in sodium chloride 0.9 % 100 mL IVPB        2 g 200 mL/hr over 30 Minutes Intravenous Every 12 hours 05/17/22 1251      05/16/22 1500  cefTRIAXone (ROCEPHIN) 1 g in sodium chloride 0.9 % 100 mL IVPB  Status:  Discontinued        1 g 200 mL/hr over 30 Minutes Intravenous Every 24 hours 05/13/2022 1719 05/17/22 1251   05/16/22 1000  remdesivir 100 mg in sodium chloride 0.9 % 100 mL IVPB       See Hyperspace for full Linked Orders Report.   100 mg 200 mL/hr over 30 Minutes Intravenous Daily 06/01/2022 1741 05/17/22 1043   05/29/2022 1915  remdesivir 200 mg in sodium chloride 0.9% 250 mL IVPB       See Hyperspace for full Linked Orders Report.   200 mg 580 mL/hr over 30 Minutes Intravenous Once 06/01/2022 1741 05/09/2022 2300   05/23/2022 1500  cefTRIAXone (ROCEPHIN) 1 g in sodium chloride 0.9 % 100 mL IVPB        1 g 200 mL/hr over 30 Minutes Intravenous  Once 05/23/2022 1457 05/23/2022 1836         Subjective:  Patient seen and examined.  He is doing well.  He denied any shortness of breath.  He was fully alert and oriented.  Objective: Vitals:   05/19/22 0310 05/19/22 0400 05/19/22 0500 05/19/22 0800  BP:  103/68  (!) 115/47  Pulse:  (!) 32  (!) 119  Resp:  17  19  Temp: (!) 97.4 F (36.3 C)   (!) 97.4 F (36.3 C)  TempSrc: Oral     SpO2:  100%  100%  Weight:   90.9 kg   Height:        Intake/Output Summary (Last 24 hours) at 05/19/2022 1118 Last data filed at 05/19/2022 1308 Gross per 24 hour  Intake 1094.74 ml  Output 400 ml  Net 694.74 ml    Filed Weights   05/17/22 0436 05/18/22 0500 05/19/22 0500  Weight: 88.9 kg 87.4 kg 90.9 kg    Examination:  General exam: Appears calm and comfortable  Respiratory system: Diminished breath sounds and may be faint rhonchi. Respiratory effort normal. Cardiovascular system: S1 & S2 heard, RRR. No JVD, murmurs, rubs, gallops or clicks. No pedal edema. Gastrointestinal system: Abdomen is nondistended, soft and nontender. No organomegaly or masses felt. Normal bowel sounds heard. Central nervous system: Alert and oriented. No focal neurological  deficits. Extremities: Symmetric 5 x 5 power. Skin: No rashes, lesions or ulcers.   Data Reviewed: I have personally reviewed following labs and imaging studies  CBC: Recent Labs  Lab 06/01/2022 1415 05/11/2022 1850 05/18/22 0711  WBC 10.9* 8.1 6.8  NEUTROABS 9.9*  --  5.5  HGB 9.9* 8.9* 8.2*  HCT 34.7* 31.5* 28.2*  MCV 103.0* 102.9* 101.1*  PLT 164 158 107*    Basic Metabolic Panel: Recent Labs  Lab 05/22/2022 1415 05/14/2022 1850 05/16/22 0323 05/17/22 0253 05/18/22 0711 05/19/22 0314  NA 145  --  148* 148* 149* 146*  K 5.3*  --  4.2 4.0 4.1 4.0  CL 107  --  108 106 108 103  CO2 32  --  33* 36* 33* 35*  GLUCOSE 111*  --  80 102* 100* 91  BUN 56*  --  63* 58* 46* 43*  CREATININE 1.21 0.99 1.27* 1.34* 1.16 1.20  CALCIUM 10.8*  --  10.6* 9.9 9.7 9.7  MG  --   --  2.2 2.2 2.0 2.3  PHOS  --   --  1.7* 3.6 2.1* 2.6    GFR: Estimated Creatinine Clearance: 49 mL/min (by C-G formula based on SCr of 1.2 mg/dL). Liver Function Tests: Recent Labs  Lab 05/22/2022 1415 05/16/22 0323 05/17/22 0253 05/18/22 0711 05/19/22 0314  AST 12* 11* 15 11* 13*  ALT '15 13 13 11 14  '$ ALKPHOS 97 81 88 70 76  BILITOT 0.8 0.8 0.6 0.5 0.6  PROT 7.0 6.1* 6.1* 5.8* 6.4*  ALBUMIN 2.9* 2.6* 2.5* 2.3* 2.5*    No results for input(s): "LIPASE", "AMYLASE" in the last 168 hours. No results for input(s): "AMMONIA" in the last 168 hours. Coagulation Profile: No results for input(s): "INR", "PROTIME" in the last 168 hours. Cardiac Enzymes: No results for input(s): "CKTOTAL", "CKMB", "CKMBINDEX", "TROPONINI" in the last 168 hours. BNP (last 3 results) No results for input(s): "PROBNP" in the last 8760 hours. HbA1C: No results for input(s): "HGBA1C" in the last 72 hours. CBG: Recent Labs  Lab 05/16/22 1339  GLUCAP 84    Lipid Profile: No results for input(s): "CHOL", "HDL", "LDLCALC", "TRIG", "CHOLHDL", "LDLDIRECT" in the last 72 hours. Thyroid Function Tests: No results for input(s): "TSH",  "T4TOTAL", "FREET4", "T3FREE", "THYROIDAB" in the last 72 hours. Anemia Panel: Recent Labs    05/18/22 0711 05/19/22 0314  FERRITIN 878* 966*    Sepsis Labs: Recent Labs  Lab 05/23/2022 1415 05/22/2022 1545 05/18/2022 1850  PROCALCITON  --   --  <0.10  LATICACIDVEN 1.0 1.1  --      Recent Results (from the past 240 hour(s))  Blood culture (routine x 2)     Status: None (Preliminary result)   Collection Time: 05/26/2022  2:15 PM   Specimen: BLOOD  Result Value Ref Range Status   Specimen Description   Final    BLOOD BLOOD RIGHT HAND Performed at [facility] [address] [place], [place] [address]    Special Requests   Final    BOTTLES DRAWN AEROBIC AND ANAEROBIC Blood Culture results may not be optimal due to an inadequate volume of blood received in culture bottles Performed at [facility] [address] [place], [place] [address]    Culture   Final    NO GROWTH 4 DAYS Performed at [facility], [place] [address], [place], [place] [address]    Report Status PENDING  Incomplete  Blood culture (routine x 2)     Status: Abnormal   Collection Time: 06/04/2022  2:15 PM   Specimen: BLOOD  Result Value Ref Range Status   Specimen Description   Final    BLOOD BLOOD RIGHT ARM Performed at [facility] [address] [place], [place] [address]    Special Requests   Final    BOTTLES DRAWN AEROBIC AND ANAEROBIC Blood Culture results may not be optimal due to an inadequate volume  of blood received in culture bottles Performed at [facility], [place] [address] [place], [place] [address]    Culture  Setup Time   Final    GRAM POSITIVE COCCI IN CHAINS ANAEROBIC BOTTLE ONLY CRITICAL RESULT CALLED TO, READ BACK BY AND VERIFIED WITH: PHARMD CHRISTY [id number] AT 1107 BY EC CRITICAL RESULT CALLED TO, READ BACK BY AND VERIFIED WITH: PHARMD J LEGGE [id number] AT 34 AM B Y CM Performed at [facility], [place] [address] [place], [place] [address]    Culture ENTEROCOCCUS FAECALIS (A)  Final   Report Status 05/19/2022 FINAL  Final   Organism ID, Bacteria ENTEROCOCCUS FAECALIS  Final      Susceptibility   Enterococcus faecalis - MIC*    AMPICILLIN <=2 SENSITIVE Sensitive     VANCOMYCIN 2 SENSITIVE Sensitive     GENTAMICIN SYNERGY SENSITIVE Sensitive     * ENTEROCOCCUS FAECALIS  Resp Panel by RT-PCR (Flu A&B, Covid) Peripheral     Status: Abnormal   Collection Time: 05/12/2022  2:15 PM   Specimen: Peripheral; Nasal Swab  Result Value Ref Range Status   SARS Coronavirus 2 by RT PCR POSITIVE (A) NEGATIVE Final    Comment: (NOTE) SARS-CoV-2 target nucleic acids are DETECTED.  The SARS-CoV-2 RNA is generally detectable in upper respiratory specimens during the acute phase of infection. Positive results are indicative of the presence of the identified virus, but do not rule out bacterial infection or co-infection with other pathogens not detected by the test. Clinical correlation with patient history and other diagnostic information is necessary to determine patient infection status. The expected result is Negative.  Fact Sheet for Patients: EntrepreneurPulse.com.au  Fact Sheet for Healthcare Providers: IncredibleEmployment.be  This test is not yet approved or cleared by the [place] FDA and  has been authorized for detection and/or diagnosis of SARS-CoV-2 by FDA under an Emergency Use Authorization (EUA).  This EUA will remain in effect (meaning this test can be used) for the duration of  the COVID-19 declaration under Section 564(b)(1) of the A ct, 21 U.S.C. section 360bbb-3(b)(1), unless the authorization is terminated or revoked sooner.     Influenza A by PCR NEGATIVE NEGATIVE Final   Influenza B by PCR NEGATIVE NEGATIVE Final    Comment: (NOTE) The Xpert Xpress SARS-CoV-2/FLU/RSV plus assay is intended as an aid in the diagnosis of  influenza from Nasopharyngeal swab specimens and should not be used as a sole basis for treatment. Nasal washings and aspirates are unacceptable for Xpert Xpress SARS-CoV-2/FLU/RSV testing.  Fact Sheet for Patients: EntrepreneurPulse.com.au  Fact Sheet for Healthcare Providers: IncredibleEmployment.be  This test is not yet approved or cleared by the [place] FDA and has been authorized for detection and/or diagnosis of SARS-CoV-2 by FDA under an Emergency Use Authorization (EUA). This EUA will remain in effect (meaning this test can be used) for the duration of the COVID-19 declaration under Section 564(b)(1) of the Act, 21 U.S.C. section 360bbb-3(b)(1), unless the authorization is terminated or revoked.  Performed at [facility], [place] [address] [place], [place] [address]   Blood Culture ID Panel (Reflexed)     Status: Abnormal   Collection Time: 05/09/2022  2:15 PM  Result Value Ref Range Status   Enterococcus faecalis DETECTED (A) NOT DETECTED Final    Comment: CRITICAL RESULT CALLED TO, READ BACK BY AND VERIFIED WITH: PHARMD J LEGGE [id number] AT 1153 AM  BY CM    Enterococcus Faecium NOT DETECTED NOT  DETECTED Final   Listeria monocytogenes NOT DETECTED NOT DETECTED Final   Staphylococcus species NOT DETECTED NOT DETECTED Final   Staphylococcus aureus (BCID) NOT DETECTED NOT DETECTED Final   Staphylococcus epidermidis NOT DETECTED NOT DETECTED Final   Staphylococcus lugdunensis NOT DETECTED NOT DETECTED Final   Streptococcus species NOT DETECTED NOT DETECTED Final   Streptococcus agalactiae NOT DETECTED NOT DETECTED Final   Streptococcus pneumoniae NOT DETECTED NOT DETECTED Final   Streptococcus pyogenes NOT DETECTED NOT DETECTED Final   A.calcoaceticus-baumannii NOT DETECTED NOT DETECTED Final   Bacteroides fragilis NOT DETECTED NOT DETECTED Final   Enterobacterales NOT DETECTED NOT DETECTED Final   Enterobacter  cloacae complex NOT DETECTED NOT DETECTED Final   Escherichia coli NOT DETECTED NOT DETECTED Final   Klebsiella aerogenes NOT DETECTED NOT DETECTED Final   Klebsiella oxytoca NOT DETECTED NOT DETECTED Final   Klebsiella pneumoniae NOT DETECTED NOT DETECTED Final   Proteus species NOT DETECTED NOT DETECTED Final   Salmonella species NOT DETECTED NOT DETECTED Final   Serratia marcescens NOT DETECTED NOT DETECTED Final   Haemophilus influenzae NOT DETECTED NOT DETECTED Final   Neisseria meningitidis NOT DETECTED NOT DETECTED Final   Pseudomonas aeruginosa NOT DETECTED NOT DETECTED Final   Stenotrophomonas maltophilia NOT DETECTED NOT DETECTED Final   Candida albicans NOT DETECTED NOT DETECTED Final   Candida auris NOT DETECTED NOT DETECTED Final   Candida glabrata NOT DETECTED NOT DETECTED Final   Candida krusei NOT DETECTED NOT DETECTED Final   Candida parapsilosis NOT DETECTED NOT DETECTED Final   Candida tropicalis NOT DETECTED NOT DETECTED Final   Cryptococcus neoformans/gattii NOT DETECTED NOT DETECTED Final   Vancomycin resistance NOT DETECTED NOT DETECTED Final    Comment: Performed at [facility], [address], [place], [place] [address]  Respiratory (~20 pathogens) panel by PCR     Status: None   Collection Time: 05/13/2022  2:50 PM   Specimen: Nasopharyngeal Swab; Respiratory  Result Value Ref Range Status   Adenovirus NOT DETECTED NOT DETECTED Final   Coronavirus 229E NOT DETECTED NOT DETECTED Final    Comment: (NOTE) The Coronavirus on the Respiratory Panel, DOES NOT test for the novel  Coronavirus (2019 nCoV)    Coronavirus HKU1 NOT DETECTED NOT DETECTED Final   Coronavirus NL63 NOT DETECTED NOT DETECTED Final   Coronavirus OC43 NOT DETECTED NOT DETECTED Final   Metapneumovirus NOT DETECTED NOT DETECTED Final   Rhinovirus / Enterovirus NOT DETECTED NOT DETECTED Final   Influenza A NOT DETECTED NOT DETECTED Final   Influenza B NOT DETECTED NOT DETECTED Final    Parainfluenza Virus 1 NOT DETECTED NOT DETECTED Final   Parainfluenza Virus 2 NOT DETECTED NOT DETECTED Final   Parainfluenza Virus 3 NOT DETECTED NOT DETECTED Final   Parainfluenza Virus 4 NOT DETECTED NOT DETECTED Final   Respiratory Syncytial Virus NOT DETECTED NOT DETECTED Final   Bordetella pertussis NOT DETECTED NOT DETECTED Final   Bordetella Parapertussis NOT DETECTED NOT DETECTED Final   Chlamydophila pneumoniae NOT DETECTED NOT DETECTED Final   Mycoplasma pneumoniae NOT DETECTED NOT DETECTED Final    Comment: Performed at [facility], [place]. [address] [place], [place] [address]  MRSA Next Gen by PCR, Nasal     Status: Abnormal   Collection Time: 05/22/2022  8:26 PM   Specimen: Nasal Mucosa; Nasal Swab  Result Value Ref Range Status   MRSA by PCR Next Gen DETECTED (A) NOT DETECTED Final    Comment: (NOTE) The GeneXpert  MRSA Assay (FDA approved for NASAL specimens only), is one component of a comprehensive MRSA colonization surveillance program. It is not intended to diagnose MRSA infection nor to guide or monitor treatment for MRSA infections. Test performance is not FDA approved in patients less than 73 years old. Performed at [facility], [place] [address] [place], [place] [address]   Culture, blood (Routine X 2) w Reflex to ID Panel     Status: None (Preliminary result)   Collection Time: 05/18/22  7:13 AM   Specimen: BLOOD LEFT ARM  Result Value Ref Range Status   Specimen Description   Final    BLOOD LEFT ARM AEROBIC BOTTLE ONLY Performed at [facility] [address] [place], [place] [address]    Special Requests   Final    NONE Performed at [facility], [place] [address] [place], [place] [address]    Culture   Final    NO GROWTH < 24 HOURS Performed at [facility] [address] [place], [place] [address]    Report Status PENDING  Incomplete  Culture, blood (Routine X 2) w Reflex to ID Panel     Status:  None (Preliminary result)   Collection Time: 05/18/22  7:13 AM   Specimen: BLOOD LEFT ARM  Result Value Ref Range Status   Specimen Description   Final    BLOOD LEFT ARM AEROBIC BOTTLE ONLY Performed at [facility], [place] [address], [place], [place] [address]    Special Requests   Final    NONE Performed at [facility], [place] [address] [place], [place] [address]    Culture   Final    NO GROWTH < 24 HOURS Performed at [facility] [address] [place], [place] [address]    Report Status PENDING  Incomplete     Radiology Studies: ECHOCARDIOGRAM LIMITED  Result Date: 05/18/2022    ECHOCARDIOGRAM LIMITED REPORT   Patient Name:   Daniel Sharp Date of Exam: 05/18/2022 Medical Rec #:  [id number]   Height:       73.0 in Accession #:    [id number]  Weight:       192.7 lb Date of Birth:  Aug 22, 1932   BSA:          2.118 m Patient Age:    89 years    BP:           115/53 mmHg Patient Gender: M           HR:           92 bpm. Exam Location:  Inpatient Procedure: Limited Echo, Cardiac Doppler and Color Doppler Indications:    Bacteremia  History:        Patient has prior history of Echocardiogram examinations, most                 recent 04/25/2022. Arrythmias:Atrial Fibrillation,                 Signs/Symptoms:Dyspnea; Risk Factors:Hypertension.                  Mitral Valve: prosthetic annuloplasty ring valve is present in                 the mitral position. Procedure Date: 2005.  Sonographer:    Eartha Inch Referring Phys: [id number] Mignon Pine  Sonographer Comments: Image acquisition challenging due to patient body habitus. IMPRESSIONS  1. Limited  Echo to evaluate for infective endocarditis  2. Mitral valve repair s/p annuloplasty ring in 2005  3. No evidence of vegetations on mitral valve or aortic valve. Consider TEE if clinical suspicion is high.  4. Left ventricular ejection fraction, by estimation, is 55 to 60%. The left ventricle has normal function.  5.  The mitral valve is degenerative. Mild mitral regurgitation. No mitral stenosis.  6. The aortic valve is normal in structure. Aortic valve regurgitation is trivial. No aortic stenosis is present.  7. The inferior vena cava is dilated in size with <50% respiratory variability, suggesting right atrial pressure of 15 mmHg. Comparison(s): No significant change from prior study. FINDINGS  Left Ventricle: Left ventricular ejection fraction, by estimation, is 55 to 60%. The left ventricle has normal function. Mitral Valve: The mitral valve is degenerative in appearance. Mild to moderate mitral annular calcification. Mild mitral valve regurgitation. There is a prosthetic annuloplasty ring present in the mitral position. Procedure Date: 2005. No evidence of mitral valve stenosis. MV peak gradient, 12.8 mmHg. The mean mitral valve gradient is 5.0 mmHg. There is no evidence of mitral valve vegetation. Aortic Valve: The aortic valve is normal in structure. Aortic valve regurgitation is trivial. No aortic stenosis is present. There is no evidence of aortic valve vegetation. Venous: The inferior vena cava is dilated in size with less than 50% respiratory variability, suggesting right atrial pressure of 15 mmHg. Additional Comments: Spectral Doppler performed. Color Doppler performed.   LV Volumes (MOD) LV vol d, MOD A2C: 203.0 ml Diastology LV vol d, MOD A4C: 163.0 ml LV e' medial:  7.73 cm/s LV vol s, MOD A2C: 110.0 ml LV e' lateral: 8.93 cm/s LV vol s, MOD A4C: 88.6 ml LV SV MOD A2C:     93.0 ml LV SV MOD A4C:     163.0 ml LV SV MOD BP:      79.7 ml IVC IVC diam: 2.80 cm MITRAL VALVE MV Peak grad: 12.8 mmHg MV Mean grad: 5.0 mmHg MV Vmax:      1.79 m/s MV Vmean:     106.0 cm/s MR Peak grad: 78.9 mmHg MR Mean grad: 53.0 mmHg MR Vmax:      444.00 cm/s MR Vmean:     346.0 cm/s Vishnu Priya Mallipeddi Electronically signed by Lorelee Cover Mallipeddi Signature Date/Time: 05/18/2022/1:37:14 PM    Final     Scheduled Meds:   Chlorhexidine Gluconate Cloth  6 each Topical Daily   docusate sodium  100 mg Oral BID   enoxaparin (LOVENOX) injection  40 mg Subcutaneous Q24H   feeding supplement  237 mL Oral BID BM   ferrous sulfate  325 mg Oral Q breakfast   finasteride  5 mg Oral Daily   furosemide  40 mg Intravenous Daily   multivitamin with minerals  1 tablet Oral Daily   mupirocin ointment  1 Application Nasal BID   mouth rinse  15 mL Mouth Rinse 4 times per day   pantoprazole  40 mg Oral BID   predniSONE  10 mg Oral QAC breakfast   pyridostigmine  60 mg Oral Q8H   tamsulosin  0.4 mg Oral Daily   Continuous Infusions:  ampicillin (OMNIPEN) IV Stopped (05/19/22 3762)   cefTRIAXone (ROCEPHIN)  IV 2 g (05/19/22 8315)   [START ON 05/20/2022] famotidine (PEPCID) IV       LOS: 4 days   Darliss Cheney, MD [facility]  05/19/2022, 11:18 AM   *Please note that this is a verbal dictation  therefore any spelling or grammatical errors are due to the "[employer] [employer]" system interpretation.  Please page via [place] and do not message via secure chat for urgent patient care matters. Secure chat can be used for non urgent patient care matters.  How to contact the [facility] Attending or Consulting provider [place] or covering provider during after hours [place], for this patient?  Check the care team in [facility] and look for a) attending/consulting TR[facility] provider listed and b) the [facility] team listed. Page or secure chat [phone]. Log into [email or web address] and use [facility]'s universal password to access. If you do not have the password, please contact the hospital operator. Locate the [facility] provider you are looking for under [facility] and page to a number that you can be directly reached. If you still have difficulty reaching the provider, please page the [facility] (Director on Call) for the Hospitalists listed on amion for assistance.

## 2022-05-20 ENCOUNTER — Other Ambulatory Visit: Payer: Self-pay

## 2022-05-20 DIAGNOSIS — U071 COVID-19: Secondary | ICD-10-CM | POA: Diagnosis not present

## 2022-05-20 DIAGNOSIS — Z952 Presence of prosthetic heart valve: Secondary | ICD-10-CM | POA: Diagnosis not present

## 2022-05-20 DIAGNOSIS — C20 Malignant neoplasm of rectum: Secondary | ICD-10-CM

## 2022-05-20 DIAGNOSIS — A498 Other bacterial infections of unspecified site: Secondary | ICD-10-CM | POA: Diagnosis not present

## 2022-05-20 LAB — CBC WITH DIFFERENTIAL/PLATELET
Abs Immature Granulocytes: 0.04 10*3/uL (ref 0.00–0.07)
Basophils Absolute: 0 10*3/uL (ref 0.0–0.1)
Basophils Relative: 0 %
Eosinophils Absolute: 0 10*3/uL (ref 0.0–0.5)
Eosinophils Relative: 0 %
HCT: 30.2 % — ABNORMAL LOW (ref 39.0–52.0)
Hemoglobin: 8.7 g/dL — ABNORMAL LOW (ref 13.0–17.0)
Immature Granulocytes: 1 %
Lymphocytes Relative: 8 %
Lymphs Abs: 0.5 10*3/uL — ABNORMAL LOW (ref 0.7–4.0)
MCH: 29.5 pg (ref 26.0–34.0)
MCHC: 28.8 g/dL — ABNORMAL LOW (ref 30.0–36.0)
MCV: 102.4 fL — ABNORMAL HIGH (ref 80.0–100.0)
Monocytes Absolute: 0.4 10*3/uL (ref 0.1–1.0)
Monocytes Relative: 5 %
Neutro Abs: 5.8 10*3/uL (ref 1.7–7.7)
Neutrophils Relative %: 86 %
Platelets: 134 10*3/uL — ABNORMAL LOW (ref 150–400)
RBC: 2.95 MIL/uL — ABNORMAL LOW (ref 4.22–5.81)
RDW: 16 % — ABNORMAL HIGH (ref 11.5–15.5)
WBC: 6.7 10*3/uL (ref 4.0–10.5)
nRBC: 0 % (ref 0.0–0.2)

## 2022-05-20 LAB — COMPREHENSIVE METABOLIC PANEL
ALT: 11 U/L (ref 0–44)
AST: 15 U/L (ref 15–41)
Albumin: 2.4 g/dL — ABNORMAL LOW (ref 3.5–5.0)
Alkaline Phosphatase: 71 U/L (ref 38–126)
Anion gap: 5 (ref 5–15)
BUN: 46 mg/dL — ABNORMAL HIGH (ref 8–23)
CO2: 38 mmol/L — ABNORMAL HIGH (ref 22–32)
Calcium: 9.5 mg/dL (ref 8.9–10.3)
Chloride: 103 mmol/L (ref 98–111)
Creatinine, Ser: 1.22 mg/dL (ref 0.61–1.24)
GFR, Estimated: 57 mL/min — ABNORMAL LOW (ref >60)
Glucose, Bld: 89 mg/dL (ref 70–99)
Potassium: 3.7 mmol/L (ref 3.5–5.1)
Sodium: 146 mmol/L — ABNORMAL HIGH (ref 135–145)
Total Bilirubin: 0.6 mg/dL (ref 0.3–1.2)
Total Protein: 6 g/dL — ABNORMAL LOW (ref 6.5–8.1)

## 2022-05-20 LAB — CULTURE, BLOOD (ROUTINE X 2): Culture: NO GROWTH

## 2022-05-20 LAB — MAGNESIUM: Magnesium: 2.1 mg/dL (ref 1.7–2.4)

## 2022-05-20 LAB — C-REACTIVE PROTEIN: CRP: 4.2 mg/dL — ABNORMAL HIGH (ref <1.0)

## 2022-05-20 NOTE — Progress Notes (Signed)
Pt is off of bipap at this time. RN said pt requested to be off the machine. No resp distress noted.

## 2022-05-20 NOTE — Progress Notes (Signed)
[place] [facility] for Infectious Disease  Date of Admission:  05/28/2022           Reason for visit: Follow up on E faecalis bacteremia  Current antibiotics: Ceftriaxone Ampicillin   ASSESSMENT:    88 y.o. male admitted with:  # Recurrent E faecalis bacteremia -- Patient admitted in October where he was diagnosed with rectal adenocarcinoma.  Developed bacteremia during that admit shortly after colonoscopy with E faecalis that cleared quickly.  He was recommended to complete a 14-day course of antibiotics ~ 05/10/22 but apparently there was a miscommunication with SNF at discharge and he did not receive antibiotics for about 7 days after discharge (10/24 - 10/31).  When the error was recognized, he was then started back on antibiotics and remained on them leading up to this readmission but is bacteremic again with blood cx positive 05/14/2022.  Repeat cultures are NGTD from 05/18/22. TEE planned for tomorrow.  # COVID 19 pneumonia -- Diagnosed two days prior to admission.  Currently requiring supplemental oxygen.  Managed by [facility].   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   # Recently diagnosed rectal adenocarcinoma -- This was diagnosed during admission last month.  Following with oncology as outpatient but yet to undergo treatment.   # Hx of renal cell carcinoma -- Status post left nephrectomy July 2023.  RECOMMENDATIONS:    Continue ceftriaxone and ampicillin Await TEE Follow up repeat cultures Concerned for endocarditis given bacteremia despite having been restarted on antibiotics several days prior to admission Will follow   Principal Problem:   Enterococcus faecalis infection Active Problems:   H/O mitral valve replacement   Acute metabolic encephalopathy   COVID-19   Acute on chronic respiratory failure with hypoxia and hypercapnia (HCC)   Protein-calorie  malnutrition, severe    MEDICATIONS:    Scheduled Meds:  Chlorhexidine Gluconate Cloth  6 each Topical Daily   docusate sodium  100 mg Oral BID   enoxaparin (LOVENOX) injection  40 mg Subcutaneous Q24H   feeding supplement  237 mL Oral BID BM   ferrous sulfate  325 mg Oral Q breakfast   finasteride  5 mg Oral Daily   furosemide  40 mg Intravenous Daily   multivitamin with minerals  1 tablet Oral Daily   mupirocin ointment  1 Application Nasal BID   mouth rinse  15 mL Mouth Rinse 4 times per day   pantoprazole  40 mg Oral BID   predniSONE  10 mg Oral QAC breakfast   pyridostigmine  60 mg Oral Q8H   tamsulosin  0.4 mg Oral Daily   Continuous Infusions:  ampicillin (OMNIPEN) IV Stopped (05/20/22 1252)   cefTRIAXone (ROCEPHIN)  IV Stopped (05/20/22 1048)   famotidine (PEPCID) IV Stopped (05/20/22 1003)   PRN Meds:.acetaminophen, ipratropium-albuterol, ondansetron **OR** ondansetron (ZOFRAN) IV, mouth rinse  SUBJECTIVE:   24 hour events:  No acute events  Afebrile Blood cx NGTD Son and family at the bedside provided insight into lapse in antibiotic coverage at last discharge    Review of Systems  All other systems reviewed and are negative.     OBJECTIVE:   Blood pressure 133/70, pulse (!) 106, temperature 97.6 F (36.4 C), temperature source Oral, resp. rate (!) 31, height '6\' 1"'$  (1.854 m), weight 90.1 kg, SpO2 93 %. Body mass index is 26.21 kg/m.  Physical Exam Constitutional:  General: He is not in acute distress. HENT:     Head: Normocephalic and atraumatic.     Mouth/Throat:     Mouth: Mucous membranes are moist.     Pharynx: Oropharynx is clear.  Eyes:     Extraocular Movements: Extraocular movements intact.     Conjunctiva/sclera: Conjunctivae normal.  Pulmonary:     Effort: Pulmonary effort is normal. No respiratory distress.  Abdominal:     General: There is no distension.     Palpations: Abdomen is soft.  Musculoskeletal:     Cervical back:  Normal range of motion and neck supple.  Skin:    General: Skin is warm and dry.     Findings: No rash.  Neurological:     General: No focal deficit present.     Mental Status: He is alert and oriented to person, place, and time.  Psychiatric:        Mood and Affect: Mood normal.        Behavior: Behavior normal.      Lab Results: Lab Results  Component Value Date   WBC 6.8 05/18/2022   HGB 8.2 (L) 05/18/2022   HCT 28.2 (L) 05/18/2022   MCV 101.1 (H) 05/18/2022   PLT 107 (L) 05/18/2022    Lab Results  Component Value Date   NA 146 (H) 05/20/2022   K 3.7 05/20/2022   CO2 38 (H) 05/20/2022   GLUCOSE 89 05/20/2022   BUN 46 (H) 05/20/2022   CREATININE 1.22 05/20/2022   CALCIUM 9.5 05/20/2022   GFRNONAA 57 (L) 05/20/2022    Lab Results  Component Value Date   ALT 11 05/20/2022   AST 15 05/20/2022   ALKPHOS 71 05/20/2022   BILITOT 0.6 05/20/2022       Component Value Date/Time   CRP 4.2 (H) 05/20/2022 0306    No results found for: "ESRSEDRATE"   I have reviewed the micro and lab results in Epic.  Imaging: No results found.   Imaging independently reviewed in Epic.    Raynelle Highland for Infectious Disease [facility] [phone] pager 05/20/2022, 1:01 PM

## 2022-05-20 NOTE — Progress Notes (Signed)
PROGRESS NOTE    Daniel Sharp  GDJ:[id number] DOB: 02/14/1933 DOA: 05/23/2022 PCP: [name], MD   Brief Narrative:  HPI: Daniel Sharp is a 88 y.o. male with medical history significant of hypertension, paroxysmal atrial fibrillation, chronic hypercapnic respiratory failure, BiPAP use at night, history of mitral valve repair, myasthenia gravis and many other comorbidities was brought into the emergency department from nursing home due to worsening shortness of breath and altered mental status.  Reportedly patient was tested positive for COVID-pneumonia 2 days ago on Monday.  He was not started on any medications.  Patient himself is on BiPAP since he arrived to the emergency department.  Unable to provide me any history.  History is obtained from the ED physician as well as from the son.   ED Course: Upon arrival to ED, he was hypoxic and hypercapnic, started on BiPAP.  BNP doubled from 4 weeks ago.  He received Rocephin as well as Lasix in the ED.  Initially PCCM was called for admission.  They evaluated the patient and had a discussion with the son and son decided to choose DNR for the patient and thus admission was deferred to hospitalist service.  PCCM will continue to follow.  Assessment & Plan:   Principal Problem:   Enterococcus faecalis infection Active Problems:   H/O mitral valve replacement   Acute metabolic encephalopathy   COVID-19   Acute on chronic respiratory failure with hypoxia and hypercapnia (HCC)   Protein-calorie malnutrition, severe  Acute on chronic respiratory failure with hypoxia and hypercapnia secondary to COVID-pneumonia as well as possible superimposed bacterial pneumonia/acute on chronic diastolic congestive heart failure: Patient does not meet sepsis criteria, sepsis ruled out.  Patient seems to have combination of COVID-pneumonia, bacterial pneumonia and acute on chronic diastolic congestive heart failure due to elevated BNP.  Patient completed 5 days of  remdesivir.  Patient on Rocephin and ampicillin.  Continue current antibiotics.  He is improving.  Currently only requiring 1 L of oxygen.   Acute metabolic encephalopathy: He was very confused and unable to speak yesterday and he was on BiPAP due to all of the above. He has remained alert and oriented since the morning of 05/16/2022.  Recurrent E faecalis bacteremia: Patient was admitted in October when he was diagnosed with rectal adenocarcinoma and double bacteremia shortly after colonoscopy.  He completed 14 days of antibiotics.  His blood cultures growing Enterococcus fecaliths again.  ID has been consulted.  Rocephin was continued and ampicillin added on 05/17/2022.  ID recommends ruling out endocarditis.  TTE was done which did not show any endocarditis but TEE is required.  Cardiology has been consulted for this.  BPH: Continue Flomax.   History of paroxysmal atrial fibrillation: Patient not on any rate control medications or any anticoagulation.    Hyperkalemia: Resolved.   CKD stage IIIa: At baseline.  Hypernatremia: Very mild and improving.  DVT prophylaxis: enoxaparin (LOVENOX) injection 40 mg Start: 05/10/2022 2200   Code Status: DNR  Family Communication: Son and grandson present at bedside.  Plan of care discussed with family.  Status is: Inpatient Remains inpatient appropriate because: Still very sick.   Estimated body mass index is 26.21 kg/m as calculated from the following:   Height as of this encounter: '6\' 1"'$  (1.854 m).   Weight as of this encounter: 90.1 kg.  Pressure Injury Coccyx Mid Deep Tissue Pressure Injury - Purple or maroon localized area of discolored intact skin or blood-filled blister due to damage of underlying  soft tissue from pressure and/or shear. (Active)     Location: Coccyx  Location Orientation: Mid  Staging: Deep Tissue Pressure Injury - Purple or maroon localized area of discolored intact skin or blood-filled blister due to damage of underlying  soft tissue from pressure and/or shear.  Wound Description (Comments):   Present on Admission: Yes  Dressing Type Foam - Lift dressing to assess site every shift 05/20/22 1100     Pressure Injury 04/15/22 Heel Right Stage 1 -  Intact skin with non-blanchable redness of a localized area usually over a bony prominence. (Active)  04/15/22 1105  Location: Heel  Location Orientation: Right  Staging: Stage 1 -  Intact skin with non-blanchable redness of a localized area usually over a bony prominence.  Wound Description (Comments):   Present on Admission: No  Dressing Type Foam - Lift dressing to assess site every shift 05/19/22 2020     Pressure Injury 06/02/2022 Sacrum Mid Stage 3 -  Full thickness tissue loss. Subcutaneous fat may be visible but bone, tendon or muscle are NOT exposed. (Active)  05/12/2022 2100  Location: Sacrum  Location Orientation: Mid  Staging: Stage 3 -  Full thickness tissue loss. Subcutaneous fat may be visible but bone, tendon or muscle are NOT exposed.  Wound Description (Comments):   Present on Admission: Yes  Dressing Type Foam - Lift dressing to assess site every shift 05/20/22 1100   Nutritional Assessment: Body mass index is 26.21 kg/m.Marland Kitchen Seen by dietician.  I agree with the assessment and plan as outlined below: Nutrition Status: Nutrition Problem: Severe Malnutrition Etiology: acute illness (covid) Signs/Symptoms: moderate fat depletion, moderate muscle depletion, edema (decreased intake but unable to provide details) Percent weight loss:  (in 3 months) Interventions: Boost Breeze, Ensure Enlive (each supplement provides 350kcal and 20 grams of protein), Liberalize Diet, Other (Comment) (diet advancement)  . Skin Assessment: I have examined the patient's skin and I agree with the wound assessment as performed by the wound care RN as outlined below: Pressure Injury Coccyx Mid Deep Tissue Pressure Injury - Purple or maroon localized area of discolored intact  skin or blood-filled blister due to damage of underlying soft tissue from pressure and/or shear. (Active)     Location: Coccyx  Location Orientation: Mid  Staging: Deep Tissue Pressure Injury - Purple or maroon localized area of discolored intact skin or blood-filled blister due to damage of underlying soft tissue from pressure and/or shear.  Wound Description (Comments):   Present on Admission: Yes  Dressing Type Foam - Lift dressing to assess site every shift 05/20/22 1100     Pressure Injury 04/15/22 Heel Right Stage 1 -  Intact skin with non-blanchable redness of a localized area usually over a bony prominence. (Active)  04/15/22 1105  Location: Heel  Location Orientation: Right  Staging: Stage 1 -  Intact skin with non-blanchable redness of a localized area usually over a bony prominence.  Wound Description (Comments):   Present on Admission: No  Dressing Type Foam - Lift dressing to assess site every shift 05/19/22 2020     Pressure Injury 05/08/2022 Sacrum Mid Stage 3 -  Full thickness tissue loss. Subcutaneous fat may be visible but bone, tendon or muscle are NOT exposed. (Active)  05/20/2022 2100  Location: Sacrum  Location Orientation: Mid  Staging: Stage 3 -  Full thickness tissue loss. Subcutaneous fat may be visible but bone, tendon or muscle are NOT exposed.  Wound Description (Comments):   Present on Admission: Yes  Dressing Type Foam - Lift dressing to assess site every shift 05/20/22 1100    Consultants:  PCCM ID Procedures:  None  Antimicrobials:  Anti-infectives (From admission, onward)    Start     Dose/Rate Route Frequency Ordered Stop   05/17/22 1345  ampicillin (OMNIPEN) 2 g in sodium chloride 0.9 % 100 mL IVPB        2 g 300 mL/hr over 20 Minutes Intravenous Every 6 hours 05/17/22 1251     05/17/22 1345  cefTRIAXone (ROCEPHIN) 2 g in sodium chloride 0.9 % 100 mL IVPB        2 g 200 mL/hr over 30 Minutes Intravenous Every 12 hours 05/17/22 1251      05/16/22 1500  cefTRIAXone (ROCEPHIN) 1 g in sodium chloride 0.9 % 100 mL IVPB  Status:  Discontinued        1 g 200 mL/hr over 30 Minutes Intravenous Every 24 hours 05/31/2022 1719 05/17/22 1251   05/16/22 1000  remdesivir 100 mg in sodium chloride 0.9 % 100 mL IVPB       See Hyperspace for full Linked Orders Report.   100 mg 200 mL/hr over 30 Minutes Intravenous Daily 05/13/2022 1741 05/17/22 1043   05/16/2022 1915  remdesivir 200 mg in sodium chloride 0.9% 250 mL IVPB       See Hyperspace for full Linked Orders Report.   200 mg 580 mL/hr over 30 Minutes Intravenous Once 05/17/2022 1741 05/25/2022 2300   05/11/2022 1500  cefTRIAXone (ROCEPHIN) 1 g in sodium chloride 0.9 % 100 mL IVPB        1 g 200 mL/hr over 30 Minutes Intravenous  Once 05/28/2022 1457 05/31/2022 1836         Subjective:  Seen and examined.  He is much more alert and oriented.  He feels a lot better today.  No complaints.  Objective: Vitals:   05/20/22 0400 05/20/22 0500 05/20/22 0800 05/20/22 1020  BP: 114/61  120/65 (!) 142/61  Pulse: 99  (!) 131 (!) 105  Resp: 20  (!) 28 (!) 24  Temp:   97.6 F (36.4 C)   TempSrc:   Oral   SpO2: 100%  100% 96%  Weight:  90.1 kg    Height:        Intake/Output Summary (Last 24 hours) at 05/20/2022 1200 Last data filed at 05/20/2022 1100 Gross per 24 hour  Intake 1100 ml  Output 150 ml  Net 950 ml    Filed Weights   05/18/22 0500 05/19/22 0500 05/20/22 0500  Weight: 87.4 kg 90.9 kg 90.1 kg    Examination:  General exam: Appears calm and comfortable  Respiratory system: Scattered rhonchi bilaterally. Respiratory effort normal. Cardiovascular system: S1 & S2 heard, RRR. No JVD, murmurs, rubs, gallops or clicks. No pedal edema. Gastrointestinal system: Abdomen is nondistended, soft and nontender. No organomegaly or masses felt. Normal bowel sounds heard. Central nervous system: Alert and oriented. No focal neurological deficits. Extremities: Symmetric 5 x 5 power. Skin: No  rashes, lesions or ulcers.  Psychiatry: Judgement and insight appear normal. Mood & affect appropriate.   Data Reviewed: I have personally reviewed following labs and imaging studies  CBC: Recent Labs  Lab 05/19/2022 1415 05/18/2022 1850 05/18/22 0711  WBC 10.9* 8.1 6.8  NEUTROABS 9.9*  --  5.5  HGB 9.9* 8.9* 8.2*  HCT 34.7* 31.5* 28.2*  MCV 103.0* 102.9* 101.1*  PLT 164 158 107*    Basic Metabolic Panel: Recent Labs  Lab  05/16/22 0323 05/17/22 0253 05/18/22 0711 05/19/22 0314 05/20/22 0306  NA 148* 148* 149* 146* 146*  K 4.2 4.0 4.1 4.0 3.7  CL 108 106 108 103 103  CO2 33* 36* 33* 35* 38*  GLUCOSE 80 102* 100* 91 89  BUN 63* 58* 46* 43* 46*  CREATININE 1.27* 1.34* 1.16 1.20 1.22  CALCIUM 10.6* 9.9 9.7 9.7 9.5  MG 2.2 2.2 2.0 2.3 2.1  PHOS 1.7* 3.6 2.1* 2.6  --     GFR: Estimated Creatinine Clearance: 48.2 mL/min (by C-G formula based on SCr of 1.22 mg/dL). Liver Function Tests: Recent Labs  Lab 05/16/22 0323 05/17/22 0253 05/18/22 0711 05/19/22 0314 05/20/22 0306  AST 11* 15 11* 13* 15  ALT '13 13 11 14 11  '$ ALKPHOS 81 88 70 76 71  BILITOT 0.8 0.6 0.5 0.6 0.6  PROT 6.1* 6.1* 5.8* 6.4* 6.0*  ALBUMIN 2.6* 2.5* 2.3* 2.5* 2.4*    No results for input(s): "LIPASE", "AMYLASE" in the last 168 hours. No results for input(s): "AMMONIA" in the last 168 hours. Coagulation Profile: No results for input(s): "INR", "PROTIME" in the last 168 hours. Cardiac Enzymes: No results for input(s): "CKTOTAL", "CKMB", "CKMBINDEX", "TROPONINI" in the last 168 hours. BNP (last 3 results) No results for input(s): "PROBNP" in the last 8760 hours. HbA1C: No results for input(s): "HGBA1C" in the last 72 hours. CBG: Recent Labs  Lab 05/16/22 1339  GLUCAP 84    Lipid Profile: No results for input(s): "CHOL", "HDL", "LDLCALC", "TRIG", "CHOLHDL", "LDLDIRECT" in the last 72 hours. Thyroid Function Tests: No results for input(s): "TSH", "T4TOTAL", "FREET4", "T3FREE", "THYROIDAB" in  the last 72 hours. Anemia Panel: Recent Labs    05/18/22 0711 05/19/22 0314  FERRITIN 878* 966*    Sepsis Labs: Recent Labs  Lab 05/12/2022 1415 06/02/2022 1545 05/23/2022 1850  PROCALCITON  --   --  <0.10  LATICACIDVEN 1.0 1.1  --      Recent Results (from the past 240 hour(s))  Blood culture (routine x 2)     Status: None   Collection Time: 05/08/2022  2:15 PM   Specimen: BLOOD  Result Value Ref Range Status   Specimen Description   Final    BLOOD BLOOD RIGHT HAND Performed at [facility] [address] [place], [place] [address]    Special Requests   Final    BOTTLES DRAWN AEROBIC AND ANAEROBIC Blood Culture results may not be optimal due to an inadequate volume of blood received in culture bottles Performed at [facility] [address] [place], [place] [address]    Culture   Final    NO GROWTH 5 DAYS Performed at [facility], [place] [address], [place], [place] [address]    Report Status 05/20/2022 FINAL  Final  Blood culture (routine x 2)     Status: Abnormal   Collection Time: 05/13/2022  2:15 PM   Specimen: BLOOD  Result Value Ref Range Status   Specimen Description   Final    BLOOD BLOOD RIGHT ARM Performed at [facility] [address] [place], [place] [address]    Special Requests   Final    BOTTLES DRAWN AEROBIC AND ANAEROBIC Blood Culture results may not be optimal due to an inadequate volume of blood received in culture bottles Performed at [facility] [address] [place], [place] [address]    Culture  Setup Time   Final    GRAM POSITIVE  COCCI IN CHAINS ANAEROBIC BOTTLE ONLY CRITICAL RESULT CALLED TO, READ BACK BY AND VERIFIED WITH: PHARMD CHRISTY [id number] AT 1107 BY EC CRITICAL RESULT CALLED TO, READ BACK BY AND VERIFIED WITH: PHARMD J LEGGE [id number] AT 23 AM B Y CM Performed at [facility], [place] [address] [place], [place] [address]    Culture  ENTEROCOCCUS FAECALIS (A)  Final   Report Status 05/19/2022 FINAL  Final   Organism ID, Bacteria ENTEROCOCCUS FAECALIS  Final      Susceptibility   Enterococcus faecalis - MIC*    AMPICILLIN <=2 SENSITIVE Sensitive     VANCOMYCIN 2 SENSITIVE Sensitive     GENTAMICIN SYNERGY SENSITIVE Sensitive     * ENTEROCOCCUS FAECALIS  Resp Panel by RT-PCR (Flu A&B, Covid) Peripheral     Status: Abnormal   Collection Time: 05/11/2022  2:15 PM   Specimen: Peripheral; Nasal Swab  Result Value Ref Range Status   SARS Coronavirus 2 by RT PCR POSITIVE (A) NEGATIVE Final    Comment: (NOTE) SARS-CoV-2 target nucleic acids are DETECTED.  The SARS-CoV-2 RNA is generally detectable in upper respiratory specimens during the acute phase of infection. Positive results are indicative of the presence of the identified virus, but do not rule out bacterial infection or co-infection with other pathogens not detected by the test. Clinical correlation with patient history and other diagnostic information is necessary to determine patient infection status. The expected result is Negative.  Fact Sheet for Patients: EntrepreneurPulse.com.au  Fact Sheet for Healthcare Providers: IncredibleEmployment.be  This test is not yet approved or cleared by the [place] FDA and  has been authorized for detection and/or diagnosis of SARS-CoV-2 by FDA under an Emergency Use Authorization (EUA).  This EUA will remain in effect (meaning this test can be used) for the duration of  the COVID-19 declaration under Section 564(b)(1) of the A ct, 21 U.S.C. section 360bbb-3(b)(1), unless the authorization is terminated or revoked sooner.     Influenza A by PCR NEGATIVE NEGATIVE Final   Influenza B by PCR NEGATIVE NEGATIVE Final    Comment: (NOTE) The Xpert Xpress SARS-CoV-2/FLU/RSV plus assay is intended as an aid in the diagnosis of influenza from Nasopharyngeal swab specimens and should not  be used as a sole basis for treatment. Nasal washings and aspirates are unacceptable for Xpert Xpress SARS-CoV-2/FLU/RSV testing.  Fact Sheet for Patients: EntrepreneurPulse.com.au  Fact Sheet for Healthcare Providers: IncredibleEmployment.be  This test is not yet approved or cleared by the [place] FDA and has been authorized for detection and/or diagnosis of SARS-CoV-2 by FDA under an Emergency Use Authorization (EUA). This EUA will remain in effect (meaning this test can be used) for the duration of the COVID-19 declaration under Section 564(b)(1) of the Act, 21 U.S.C. section 360bbb-3(b)(1), unless the authorization is terminated or revoked.  Performed at [facility], [place] [address], [place],  [address]   Blood Culture ID Panel (Reflexed)     Status: Abnormal   Collection Time: 06/06/2022  2:15 PM  Result Value Ref Range Status   Enterococcus faecalis DETECTED (A) NOT DETECTED Final    Comment: CRITICAL RESULT CALLED TO, READ BACK BY AND VERIFIED WITH: PHARMD J LEGGE [id number] AT 1153 AM  BY CM    Enterococcus Faecium NOT DETECTED NOT DETECTED Final   Listeria monocytogenes NOT DETECTED NOT DETECTED Final   Staphylococcus species NOT DETECTED NOT DETECTED Final   Staphylococcus aureus (BCID) NOT DETECTED NOT DETECTED Final   Staphylococcus epidermidis  NOT DETECTED NOT DETECTED Final   Staphylococcus lugdunensis NOT DETECTED NOT DETECTED Final   Streptococcus species NOT DETECTED NOT DETECTED Final   Streptococcus agalactiae NOT DETECTED NOT DETECTED Final   Streptococcus pneumoniae NOT DETECTED NOT DETECTED Final   Streptococcus pyogenes NOT DETECTED NOT DETECTED Final   A.calcoaceticus-baumannii NOT DETECTED NOT DETECTED Final   Bacteroides fragilis NOT DETECTED NOT DETECTED Final   Enterobacterales NOT DETECTED NOT DETECTED Final   Enterobacter cloacae complex NOT DETECTED NOT DETECTED Final   Escherichia  coli NOT DETECTED NOT DETECTED Final   Klebsiella aerogenes NOT DETECTED NOT DETECTED Final   Klebsiella oxytoca NOT DETECTED NOT DETECTED Final   Klebsiella pneumoniae NOT DETECTED NOT DETECTED Final   Proteus species NOT DETECTED NOT DETECTED Final   Salmonella species NOT DETECTED NOT DETECTED Final   Serratia marcescens NOT DETECTED NOT DETECTED Final   Haemophilus influenzae NOT DETECTED NOT DETECTED Final   Neisseria meningitidis NOT DETECTED NOT DETECTED Final   Pseudomonas aeruginosa NOT DETECTED NOT DETECTED Final   Stenotrophomonas maltophilia NOT DETECTED NOT DETECTED Final   Candida albicans NOT DETECTED NOT DETECTED Final   Candida auris NOT DETECTED NOT DETECTED Final   Candida glabrata NOT DETECTED NOT DETECTED Final   Candida krusei NOT DETECTED NOT DETECTED Final   Candida parapsilosis NOT DETECTED NOT DETECTED Final   Candida tropicalis NOT DETECTED NOT DETECTED Final   Cryptococcus neoformans/gattii NOT DETECTED NOT DETECTED Final   Vancomycin resistance NOT DETECTED NOT DETECTED Final    Comment: Performed at [facility], [address] [place], [place] [address]  Respiratory (~20 pathogens) panel by PCR     Status: None   Collection Time: 05/23/2022  2:50 PM   Specimen: Nasopharyngeal Swab; Respiratory  Result Value Ref Range Status   Adenovirus NOT DETECTED NOT DETECTED Final   Coronavirus 229E NOT DETECTED NOT DETECTED Final    Comment: (NOTE) The Coronavirus on the Respiratory Panel, DOES NOT test for the novel  Coronavirus (2019 nCoV)    Coronavirus HKU1 NOT DETECTED NOT DETECTED Final   Coronavirus NL63 NOT DETECTED NOT DETECTED Final   Coronavirus OC43 NOT DETECTED NOT DETECTED Final   Metapneumovirus NOT DETECTED NOT DETECTED Final   Rhinovirus / Enterovirus NOT DETECTED NOT DETECTED Final   Influenza A NOT DETECTED NOT DETECTED Final   Influenza B NOT DETECTED NOT DETECTED Final   Parainfluenza Virus 1 NOT DETECTED NOT DETECTED Final    Parainfluenza Virus 2 NOT DETECTED NOT DETECTED Final   Parainfluenza Virus 3 NOT DETECTED NOT DETECTED Final   Parainfluenza Virus 4 NOT DETECTED NOT DETECTED Final   Respiratory Syncytial Virus NOT DETECTED NOT DETECTED Final   Bordetella pertussis NOT DETECTED NOT DETECTED Final   Bordetella Parapertussis NOT DETECTED NOT DETECTED Final   Chlamydophila pneumoniae NOT DETECTED NOT DETECTED Final   Mycoplasma pneumoniae NOT DETECTED NOT DETECTED Final    Comment: Performed at [facility], [place] [address], [place], [place] [address]  MRSA Next Gen by PCR, Nasal     Status: Abnormal   Collection Time: 05/27/2022  8:26 PM   Specimen: Nasal Mucosa; Nasal Swab  Result Value Ref Range Status   MRSA by PCR Next Gen DETECTED (A) NOT DETECTED Final    Comment: (NOTE) The GeneXpert MRSA Assay (FDA approved for NASAL specimens only), is one component of a comprehensive MRSA colonization surveillance program. It is not intended to diagnose MRSA infection nor to guide or monitor treatment for MRSA  infections. Test performance is not FDA approved in patients less than 18 years old. Performed at [facility], [place] [address], [place], [place] [address]   Culture, blood (Routine X 2) w Reflex to ID Panel     Status: None (Preliminary result)   Collection Time: 05/18/22  7:13 AM   Specimen: BLOOD LEFT ARM  Result Value Ref Range Status   Specimen Description   Final    BLOOD LEFT ARM AEROBIC BOTTLE ONLY Performed at [facility] [address] [place], [place] [address]    Special Requests   Final    NONE Performed at [facility], [place] [address], [place], [place] [address]    Culture   Final    NO GROWTH 2 DAYS Performed at [facility] [address], [place], [place] [address]    Report Status PENDING  Incomplete  Culture, blood (Routine X 2) w Reflex to ID Panel     Status: None (Preliminary result)   Collection Time: 05/18/22  7:13  AM   Specimen: BLOOD LEFT ARM  Result Value Ref Range Status   Specimen Description   Final    BLOOD LEFT ARM AEROBIC BOTTLE ONLY Performed at [facility], [place] [address], [place], [place] [address]    Special Requests   Final    NONE Performed at [facility], [place] [address], [place], [place] [address]    Culture   Final    NO GROWTH 2 DAYS Performed at [facility] [address] [place], [place] [address]    Report Status PENDING  Incomplete     Radiology Studies: No results found.  Scheduled Meds:  Chlorhexidine Gluconate Cloth  6 each Topical Daily   docusate sodium  100 mg Oral BID   enoxaparin (LOVENOX) injection  40 mg Subcutaneous Q24H   feeding supplement  237 mL Oral BID BM   ferrous sulfate  325 mg Oral Q breakfast   finasteride  5 mg Oral Daily   furosemide  40 mg Intravenous Daily   multivitamin with minerals  1 tablet Oral Daily   mupirocin ointment  1 Application Nasal BID   mouth rinse  15 mL Mouth Rinse 4 times per day   pantoprazole  40 mg Oral BID   predniSONE  10 mg Oral QAC breakfast   pyridostigmine  60 mg Oral Q8H   tamsulosin  0.4 mg Oral Daily   Continuous Infusions:  ampicillin (OMNIPEN) IV Stopped (05/20/22 8937)   cefTRIAXone (ROCEPHIN)  IV Stopped (05/20/22 1050)   famotidine (PEPCID) IV Stopped (05/20/22 1015)     LOS: 5 days   Darliss Cheney, MD [facility]  05/20/2022, 12:00 PM   *Please note that this is a verbal dictation therefore any spelling or grammatical errors are due to the "Person One" system interpretation.  Please page via [place] and do not message via secure chat for urgent patient care matters. Secure chat can be used for non urgent patient care matters.  How to contact the [facility] Attending or Consulting provider [place] or covering provider during after hours [place], for this patient?  Check the care team in [facility] and look for a) attending/consulting [facility] provider listed and  b) the [facility] team listed. Page or secure chat [phone]. Log into [email or web address] and use [place]'s universal password to access. If you do not have the password, please contact the hospital operator. Locate  the [facility] provider you are looking for under [facility] and page to a number that you can be directly reached. If you still have difficulty reaching the provider, please page the [facility] (Director on Call) for the Hospitalists listed on amion for assistance.

## 2022-05-21 ENCOUNTER — Inpatient Hospital Stay (HOSPITAL_COMMUNITY): Payer: Medicare HMO

## 2022-05-21 ENCOUNTER — Encounter (HOSPITAL_COMMUNITY): Payer: Self-pay | Admitting: Family Medicine

## 2022-05-21 ENCOUNTER — Encounter (HOSPITAL_COMMUNITY): Admission: EM | Disposition: E | Payer: Self-pay | Source: Skilled Nursing Facility | Attending: Internal Medicine

## 2022-05-21 DIAGNOSIS — U071 COVID-19: Secondary | ICD-10-CM | POA: Diagnosis not present

## 2022-05-21 DIAGNOSIS — A498 Other bacterial infections of unspecified site: Secondary | ICD-10-CM | POA: Diagnosis not present

## 2022-05-21 DIAGNOSIS — Z952 Presence of prosthetic heart valve: Secondary | ICD-10-CM | POA: Diagnosis not present

## 2022-05-21 DIAGNOSIS — C20 Malignant neoplasm of rectum: Secondary | ICD-10-CM | POA: Diagnosis not present

## 2022-05-21 LAB — COMPREHENSIVE METABOLIC PANEL
ALT: 15 U/L (ref 0–44)
AST: 17 U/L (ref 15–41)
Albumin: 2.4 g/dL — ABNORMAL LOW (ref 3.5–5.0)
Alkaline Phosphatase: 78 U/L (ref 38–126)
Anion gap: 4 — ABNORMAL LOW (ref 5–15)
BUN: 43 mg/dL — ABNORMAL HIGH (ref 8–23)
CO2: 38 mmol/L — ABNORMAL HIGH (ref 22–32)
Calcium: 9.7 mg/dL (ref 8.9–10.3)
Chloride: 102 mmol/L (ref 98–111)
Creatinine, Ser: 1.28 mg/dL — ABNORMAL HIGH (ref 0.61–1.24)
GFR, Estimated: 54 mL/min — ABNORMAL LOW (ref >60)
Glucose, Bld: 99 mg/dL (ref 70–99)
Potassium: 3.8 mmol/L (ref 3.5–5.1)
Sodium: 144 mmol/L (ref 135–145)
Total Bilirubin: 0.6 mg/dL (ref 0.3–1.2)
Total Protein: 5.6 g/dL — ABNORMAL LOW (ref 6.5–8.1)

## 2022-05-21 LAB — CBC
HCT: 27.6 % — ABNORMAL LOW (ref 39.0–52.0)
Hemoglobin: 8 g/dL — ABNORMAL LOW (ref 13.0–17.0)
MCH: 29.6 pg (ref 26.0–34.0)
MCHC: 29 g/dL — ABNORMAL LOW (ref 30.0–36.0)
MCV: 102.2 fL — ABNORMAL HIGH (ref 80.0–100.0)
Platelets: 123 10*3/uL — ABNORMAL LOW (ref 150–400)
RBC: 2.7 MIL/uL — ABNORMAL LOW (ref 4.22–5.81)
RDW: 16.1 % — ABNORMAL HIGH (ref 11.5–15.5)
WBC: 7.1 10*3/uL (ref 4.0–10.5)
nRBC: 0 % (ref 0.0–0.2)

## 2022-05-21 SURGERY — ECHOCARDIOGRAM, TRANSESOPHAGEAL
Anesthesia: Monitor Anesthesia Care

## 2022-05-21 NOTE — Progress Notes (Signed)
[facility]  Date of Admission:  05/22/2022           Reason for visit: Follow up on E faecalis bacteremia   Current antibiotics: Ceftriaxone Ampicillin  ASSESSMENT:    88 y.o. male admitted with:  # Recurrent E faecalis bacteremia -- Patient admitted in October when he was diagnosed with rectal adenocarcinoma.  Developed bacteremia during that admit shortly after colonoscopy with E faecalis that cleared quickly.  He was recommended to complete a 14-day course of antibiotics ~ 05/10/22 but apparently there was a miscommunication with SNF at discharge and he did not receive antibiotics for about 7 days after discharge (10/24 - 10/31).  When the error was recognized, he was then started back on antibiotics and remained on them leading up to this readmission but is bacteremic again with blood cx positive 05/25/2022.  Repeat cultures are NGTD from 05/18/22. TEE pending.  # COVID 19 pneumonia -- Diagnosed two days prior to admission.  Currently requiring about 2 L via [place] for supplemental oxygen.  Managed by [facility].   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   # Recently diagnosed rectal adenocarcinoma -- This was diagnosed during admission last month.  Following with oncology as outpatient but yet to undergo treatment.   # Hx of renal cell carcinoma -- Status post left nephrectomy July 2023.  RECOMMENDATIONS:    Continue with ceftriaxone and ampicillin as is Await TEE.  Discussed today with cardiology  Follow up repeat cultures Results of TEE will help guide antibiotic selection, route, and duration of therapy Following   Principal Problem:   Enterococcus faecalis infection Active Problems:   H/O mitral valve replacement   Acute metabolic encephalopathy   Rectal cancer (HCC)   COVID-19 virus infection   Acute on chronic respiratory failure with hypoxia  and hypercapnia (HCC)   Protein-calorie malnutrition, severe    MEDICATIONS:    Scheduled Meds:  Chlorhexidine Gluconate Cloth  6 each Topical Daily   docusate sodium  100 mg Oral BID   feeding supplement  237 mL Oral BID BM   ferrous sulfate  325 mg Oral Q breakfast   finasteride  5 mg Oral Daily   furosemide  40 mg Intravenous Daily   multivitamin with minerals  1 tablet Oral Daily   mupirocin ointment  1 Application Nasal BID   mouth rinse  15 mL Mouth Rinse 4 times per day   pantoprazole  40 mg Oral BID   predniSONE  10 mg Oral QAC breakfast   pyridostigmine  60 mg Oral Q8H   tamsulosin  0.4 mg Oral Daily   Continuous Infusions:  ampicillin (OMNIPEN) IV Stopped (05/25/2022 1146)   cefTRIAXone (ROCEPHIN)  IV Stopped (05/13/2022 1004)   famotidine (PEPCID) IV Stopped (05/20/2022 1124)   PRN Meds:.acetaminophen, ipratropium-albuterol, ondansetron **OR** ondansetron (ZOFRAN) IV, mouth rinse  SUBJECTIVE:     No events noted Afebrile Repeat cx NGTD No new complaints  Review of Systems  All other systems reviewed and are negative.     OBJECTIVE:   Blood pressure (!) 118/49, pulse 78, temperature 98.1 F (36.7 C), temperature source Oral, resp. rate (!) 22, height '6\' 1"'$  (1.854 m), weight 90.1 kg, SpO2 (!) 88 %. Body mass index is 26.21 kg/m.  Physical Exam Constitutional:      General: He is not in acute distress. HENT:  Head: Normocephalic and atraumatic.  Eyes:     Extraocular Movements: Extraocular movements intact.     Conjunctiva/sclera: Conjunctivae normal.  Pulmonary:     Effort: Pulmonary effort is normal. No respiratory distress.  Abdominal:     General: There is no distension.     Palpations: Abdomen is soft.  Musculoskeletal:     Cervical back: Normal range of motion and neck supple.  Skin:    General: Skin is warm and dry.  Neurological:     General: No focal deficit present.     Mental Status: He is alert. Mental status is at baseline.   Psychiatric:        Mood and Affect: Mood normal.        Behavior: Behavior normal.      Lab Results: Lab Results  Component Value Date   WBC 7.1 05/10/2022   HGB 8.0 (L) 06/05/2022   HCT 27.6 (L) 05/18/2022   MCV 102.2 (H) 05/14/2022   PLT 123 (L) 06/02/2022    Lab Results  Component Value Date   NA 144 05/19/2022   K 3.8 06/01/2022   CO2 38 (H) 05/16/2022   GLUCOSE 99 05/11/2022   BUN 43 (H) 05/28/2022   CREATININE 1.28 (H) 05/31/2022   CALCIUM 9.7 06/02/2022   GFRNONAA 54 (L) 05/31/2022    Lab Results  Component Value Date   ALT 15 05/25/2022   AST 17 05/23/2022   ALKPHOS 78 05/26/2022   BILITOT 0.6 05/31/2022       Component Value Date/Time   CRP 4.2 (H) 05/20/2022 0306    No results found for: "ESRSEDRATE"   I have reviewed the micro and lab results in Epic.  Imaging: No results found.   Imaging independently reviewed in Epic.    Raynelle [facility] [phone] pager 05/10/2022, 1:28 PM

## 2022-05-21 NOTE — Progress Notes (Signed)
PROGRESS NOTE    Daniel Sharp  JSE:[id number] DOB: 1932/07/12 DOA: 05/27/2022 PCP: [name], MD   Brief Narrative:  HPI: Daniel Sharp is a 88 y.o. male with medical history significant of hypertension, paroxysmal atrial fibrillation, chronic hypercapnic respiratory failure, BiPAP use at night, history of mitral valve repair, myasthenia gravis and many other comorbidities was brought into the emergency department from nursing home due to worsening shortness of breath and altered mental status.  Reportedly patient was tested positive for COVID-pneumonia 2 days ago on Monday.  He was not started on any medications.  Patient himself is on BiPAP since he arrived to the emergency department.  Unable to provide me any history.  History is obtained from the ED physician as well as from the son.   ED Course: Upon arrival to ED, he was hypoxic and hypercapnic, started on BiPAP.  BNP doubled from 4 weeks ago.  He received Rocephin as well as Lasix in the ED.  Initially PCCM was called for admission.  They evaluated the patient and had a discussion with the son and son decided to choose DNR for the patient and thus admission was deferred to hospitalist service.  PCCM will continue to follow.  Assessment & Plan:   Principal Problem:   Enterococcus faecalis infection Active Problems:   Rectal cancer ([place])   H/O mitral valve replacement   Acute metabolic encephalopathy   COVID-19 virus infection   Acute on chronic respiratory failure with hypoxia and hypercapnia (HCC)   Protein-calorie malnutrition, severe  Acute on chronic respiratory failure with hypoxia and hypercapnia secondary to COVID-pneumonia as well as possible superimposed bacterial pneumonia/acute on chronic diastolic congestive heart failure: Patient does not meet sepsis criteria, sepsis ruled out.  Patient seems to have combination of COVID-pneumonia, bacterial pneumonia and acute on chronic diastolic congestive heart failure due to  elevated BNP.  Patient completed 5 days of remdesivir.  Patient on Rocephin and ampicillin.  Continue current antibiotics.  Continue IV Lasix 40 g daily.  Clinically he is improving but he is requiring 3 L oxygen today while he was requiring only 1 L yesterday so I will repeat his chest x-ray.  Most of this is likely secondary to atelectasis as patient is mostly bedbound and does not use incentive spirometry as advised.   Acute metabolic encephalopathy: He was very confused and unable to speak yesterday and he was on BiPAP due to all of the above. He has remained alert and oriented since the morning of 05/16/2022.  Recurrent E faecalis bacteremia: Patient was admitted in October when he was diagnosed with rectal adenocarcinoma and double bacteremia shortly after colonoscopy.  He completed 14 days of antibiotics.  His blood cultures growing Enterococcus fecaliths again.  ID has been consulted.  Rocephin was continued and ampicillin added on 05/17/2022.  ID recommends ruling out endocarditis.  TTE was done which did not show any endocarditis but TEE is required.  Cardiology has been consulted for this.  Waiting for cardiology to schedule this.  BPH: Continue Flomax.   History of paroxysmal atrial fibrillation: Patient not on any rate control medications or any anticoagulation.    Hyperkalemia: Resolved.   CKD stage IIIa: At baseline.  An episode of nosebleed/blood at the penile meatus: Per nursing report, patient was noted to have some nosebleed and some blood was noted at the meatus of the penis yesterday.  Both bleedings have been stopped.  I discontinued his Lovenox.  We will watch for now.  Hemoglobin stable.  Hypernatremia: Very mild and improving.  Anemia of chronic disease: Hemoglobin stable around 8.  Acute thrombocytopenia: Likely secondary to acute illness.  Lovenox discontinued for now.  DVT prophylaxis:    Code Status: DNR  Family Communication: Sister present at bedside.  Plan of  care discussed with family.  Status is: Inpatient Remains inpatient appropriate because: Still very sick.   Estimated body mass index is 26.21 kg/m as calculated from the following:   Height as of this encounter: '6\' 1"'$  (1.854 m).   Weight as of this encounter: 90.1 kg.  Pressure Injury Coccyx Mid Deep Tissue Pressure Injury - Purple or maroon localized area of discolored intact skin or blood-filled blister due to damage of underlying soft tissue from pressure and/or shear. (Active)     Location: Coccyx  Location Orientation: Mid  Staging: Deep Tissue Pressure Injury - Purple or maroon localized area of discolored intact skin or blood-filled blister due to damage of underlying soft tissue from pressure and/or shear.  Wound Description (Comments):   Present on Admission: Yes  Dressing Type Foam - Lift dressing to assess site every shift 05/20/22 2000     Pressure Injury 04/15/22 Heel Right Stage 1 -  Intact skin with non-blanchable redness of a localized area usually over a bony prominence. (Active)  04/15/22 1105  Location: Heel  Location Orientation: Right  Staging: Stage 1 -  Intact skin with non-blanchable redness of a localized area usually over a bony prominence.  Wound Description (Comments):   Present on Admission: No  Dressing Type Foam - Lift dressing to assess site every shift 05/14/2022 0800     Pressure Injury 06/03/2022 Sacrum Mid Stage 3 -  Full thickness tissue loss. Subcutaneous fat may be visible but bone, tendon or muscle are NOT exposed. (Active)  05/22/2022 2100  Location: Sacrum  Location Orientation: Mid  Staging: Stage 3 -  Full thickness tissue loss. Subcutaneous fat may be visible but bone, tendon or muscle are NOT exposed.  Wound Description (Comments):   Present on Admission: Yes  Dressing Type Foam - Lift dressing to assess site every shift 06/06/2022 0800   Nutritional Assessment: Body mass index is 26.21 kg/m.Marland Kitchen Seen by dietician.  I agree with the  assessment and plan as outlined below: Nutrition Status: Nutrition Problem: Severe Malnutrition Etiology: acute illness (covid) Signs/Symptoms: moderate fat depletion, moderate muscle depletion, edema (decreased intake but unable to provide details) Percent weight loss:  (in 3 months) Interventions: Boost Breeze, Ensure Enlive (each supplement provides 350kcal and 20 grams of protein), Liberalize Diet, Other (Comment) (diet advancement)  . Skin Assessment: I have examined the patient's skin and I agree with the wound assessment as performed by the wound care RN as outlined below: Pressure Injury Coccyx Mid Deep Tissue Pressure Injury - Purple or maroon localized area of discolored intact skin or blood-filled blister due to damage of underlying soft tissue from pressure and/or shear. (Active)     Location: Coccyx  Location Orientation: Mid  Staging: Deep Tissue Pressure Injury - Purple or maroon localized area of discolored intact skin or blood-filled blister due to damage of underlying soft tissue from pressure and/or shear.  Wound Description (Comments):   Present on Admission: Yes  Dressing Type Foam - Lift dressing to assess site every shift 05/20/22 2000     Pressure Injury 04/15/22 Heel Right Stage 1 -  Intact skin with non-blanchable redness of a localized area usually over a bony prominence. (Active)  04/15/22 1105  Location: Heel  Location Orientation: Right  Staging: Stage 1 -  Intact skin with non-blanchable redness of a localized area usually over a bony prominence.  Wound Description (Comments):   Present on Admission: No  Dressing Type Foam - Lift dressing to assess site every shift 05/23/2022 0800     Pressure Injury 05/23/2022 Sacrum Mid Stage 3 -  Full thickness tissue loss. Subcutaneous fat may be visible but bone, tendon or muscle are NOT exposed. (Active)  05/08/2022 2100  Location: Sacrum  Location Orientation: Mid  Staging: Stage 3 -  Full thickness tissue loss.  Subcutaneous fat may be visible but bone, tendon or muscle are NOT exposed.  Wound Description (Comments):   Present on Admission: Yes  Dressing Type Foam - Lift dressing to assess site every shift 05/16/2022 0800    Consultants:  PCCM ID Procedures:  None  Antimicrobials:  Anti-infectives (From admission, onward)    Start     Dose/Rate Route Frequency Ordered Stop   05/17/22 1345  ampicillin (OMNIPEN) 2 g in sodium chloride 0.9 % 100 mL IVPB        2 g 300 mL/hr over 20 Minutes Intravenous Every 6 hours 05/17/22 1251     05/17/22 1345  cefTRIAXone (ROCEPHIN) 2 g in sodium chloride 0.9 % 100 mL IVPB        2 g 200 mL/hr over 30 Minutes Intravenous Every 12 hours 05/17/22 1251     05/16/22 1500  cefTRIAXone (ROCEPHIN) 1 g in sodium chloride 0.9 % 100 mL IVPB  Status:  Discontinued        1 g 200 mL/hr over 30 Minutes Intravenous Every 24 hours 05/23/2022 1719 05/17/22 1251   05/16/22 1000  remdesivir 100 mg in sodium chloride 0.9 % 100 mL IVPB       See Hyperspace for full Linked Orders Report.   100 mg 200 mL/hr over 30 Minutes Intravenous Daily 05/11/2022 1741 05/17/22 1043   05/29/2022 1915  remdesivir 200 mg in sodium chloride 0.9% 250 mL IVPB       See Hyperspace for full Linked Orders Report.   200 mg 580 mL/hr over 30 Minutes Intravenous Once 05/10/2022 1741 05/27/2022 2300   05/14/2022 1500  cefTRIAXone (ROCEPHIN) 1 g in sodium chloride 0.9 % 100 mL IVPB        1 g 200 mL/hr over 30 Minutes Intravenous  Once 05/14/2022 1457 05/25/2022 1836         Subjective:  Patient seen and examined.  He says that he is feeling better.  Sister at the bedside.  He appears physically weak.  Objective: Vitals:   05/08/2022 0300 05/27/2022 0400 05/14/2022 0725 05/14/2022 0800  BP:  (!) 120/53  (!) 146/74  Sharp: 66 82  73  Resp: 20 19  (!) 34  Temp: 97.7 F (36.5 C)  97.7 F (36.5 C)   TempSrc: Oral  Oral   SpO2: 99% 92%  100%  Weight:      Height:        Intake/Output Summary (Last 24 hours)  at 06/01/2022 1030 Last data filed at 06/02/2022 2947 Gross per 24 hour  Intake 691.07 ml  Output 525 ml  Net 166.07 ml    Filed Weights   05/18/22 0500 05/19/22 0500 05/20/22 0500  Weight: 87.4 kg 90.9 kg 90.1 kg    Examination:  General exam: Appears calm and comfortable  Respiratory system: Very and rhonchi bilaterally with diminished breath sounds in the bases bilaterally, respiratory effort normal. Cardiovascular system:  S1 & S2 heard, RRR. No JVD, murmurs, rubs, gallops or clicks. No pedal edema. Gastrointestinal system: Abdomen is nondistended, soft and nontender. No organomegaly or masses felt. Normal bowel sounds heard. Central nervous system: Alert and oriented. No focal neurological deficits. Extremities: Symmetric 5 x 5 power. Skin: No rashes, lesions or ulcers.  Psychiatry: Judgement and insight appear normal. Mood & affect appropriate.   Data Reviewed: I have personally reviewed following labs and imaging studies  CBC: Recent Labs  Lab 05/22/2022 1415 05/29/2022 1850 05/18/22 0711 05/20/22 1733 06/01/2022 0257  WBC 10.9* 8.1 6.8 6.7 7.1  NEUTROABS 9.9*  --  5.5 5.8  --   HGB 9.9* 8.9* 8.2* 8.7* 8.0*  HCT 34.7* 31.5* 28.2* 30.2* 27.6*  MCV 103.0* 102.9* 101.1* 102.4* 102.2*  PLT 164 158 107* 134* 123*    Basic Metabolic Panel: Recent Labs  Lab 05/16/22 0323 05/17/22 0253 05/18/22 0711 05/19/22 0314 05/20/22 0306 05/18/2022 0257  NA 148* 148* 149* 146* 146* 144  K 4.2 4.0 4.1 4.0 3.7 3.8  CL 108 106 108 103 103 102  CO2 33* 36* 33* 35* 38* 38*  GLUCOSE 80 102* 100* 91 89 99  BUN 63* 58* 46* 43* 46* 43*  CREATININE 1.27* 1.34* 1.16 1.20 1.22 1.28*  CALCIUM 10.6* 9.9 9.7 9.7 9.5 9.7  MG 2.2 2.2 2.0 2.3 2.1  --   PHOS 1.7* 3.6 2.1* 2.6  --   --     GFR: Estimated Creatinine Clearance: 45.9 mL/min (A) (by C-G formula based on SCr of 1.28 mg/dL (H)). Liver Function Tests: Recent Labs  Lab 05/17/22 0253 05/18/22 0711 05/19/22 0314 05/20/22 0306  05/12/2022 0257  AST 15 11* 13* 15 17  ALT '13 11 14 11 15  '$ ALKPHOS 88 70 76 71 78  BILITOT 0.6 0.5 0.6 0.6 0.6  PROT 6.1* 5.8* 6.4* 6.0* 5.6*  ALBUMIN 2.5* 2.3* 2.5* 2.4* 2.4*    No results for input(s): "LIPASE", "AMYLASE" in the last 168 hours. No results for input(s): "AMMONIA" in the last 168 hours. Coagulation Profile: No results for input(s): "INR", "PROTIME" in the last 168 hours. Cardiac Enzymes: No results for input(s): "CKTOTAL", "CKMB", "CKMBINDEX", "TROPONINI" in the last 168 hours. BNP (last 3 results) No results for input(s): "PROBNP" in the last 8760 hours. HbA1C: No results for input(s): "HGBA1C" in the last 72 hours. CBG: Recent Labs  Lab 05/16/22 1339  GLUCAP 84    Lipid Profile: No results for input(s): "CHOL", "HDL", "LDLCALC", "TRIG", "CHOLHDL", "LDLDIRECT" in the last 72 hours. Thyroid Function Tests: No results for input(s): "TSH", "T4TOTAL", "FREET4", "T3FREE", "THYROIDAB" in the last 72 hours. Anemia Panel: Recent Labs    05/19/22 0314  FERRITIN 966*    Sepsis Labs: Recent Labs  Lab 05/25/2022 1415 05/29/2022 1545 06/03/2022 1850  PROCALCITON  --   --  <0.10  LATICACIDVEN 1.0 1.1  --      Recent Results (from the past 240 hour(s))  Blood culture (routine x 2)     Status: None   Collection Time: 05/20/2022  2:15 PM   Specimen: BLOOD  Result Value Ref Range Status   Specimen Description   Final    BLOOD BLOOD RIGHT HAND Performed at [facility], [place] [address], [place], [place] [address]    Special Requests   Final    BOTTLES DRAWN AEROBIC AND ANAEROBIC Blood Culture results may not be optimal due to an inadequate volume of blood received in culture bottles Performed at [facility]  [place] [address] [place], [place] [address]    Culture   Final    NO GROWTH 5 DAYS Performed at [facility], [place] [address], [place], [place] [address]    Report Status 05/20/2022 FINAL  Final  Blood culture  (routine x 2)     Status: Abnormal   Collection Time: 05/25/2022  2:15 PM   Specimen: BLOOD  Result Value Ref Range Status   Specimen Description   Final    BLOOD BLOOD RIGHT ARM Performed at [place] [address] [place], [place] [address]    Special Requests   Final    BOTTLES DRAWN AEROBIC AND ANAEROBIC Blood Culture results may not be optimal due to an inadequate volume of blood received in culture bottles Performed at [place] [address] [place], [place] [address]    Culture  Setup Time   Final    GRAM POSITIVE COCCI IN CHAINS ANAEROBIC BOTTLE ONLY CRITICAL RESULT CALLED TO, READ BACK BY AND VERIFIED WITH: PHARMD CHRISTY [id number] AT 1107 BY EC CRITICAL RESULT CALLED TO, READ BACK BY AND VERIFIED WITH: PHARMD J LEGGE [id number] AT 49 AM B Y CM Performed at [facility], [employer] [address], [place],  [address]    Culture ENTEROCOCCUS FAECALIS (A)  Final   Report Status 05/19/2022 FINAL  Final   Organism ID, Bacteria ENTEROCOCCUS FAECALIS  Final      Susceptibility   Enterococcus faecalis - MIC*    AMPICILLIN <=2 SENSITIVE Sensitive     VANCOMYCIN 2 SENSITIVE Sensitive     GENTAMICIN SYNERGY SENSITIVE Sensitive     * ENTEROCOCCUS FAECALIS  Resp Panel by RT-PCR (Flu A&B, Covid) Peripheral     Status: Abnormal   Collection Time: 05/16/2022  2:15 PM   Specimen: Peripheral; Nasal Swab  Result Value Ref Range Status   SARS Coronavirus 2 by RT PCR POSITIVE (A) NEGATIVE Final    Comment: (NOTE) SARS-CoV-2 target nucleic acids are DETECTED.  The SARS-CoV-2 RNA is generally detectable in upper respiratory specimens during the acute phase of infection. Positive results are indicative of the presence of the identified virus, but do not rule out bacterial infection or co-infection with other pathogens not detected by the test. Clinical correlation with patient history and other diagnostic information is necessary to determine  patient infection status. The expected result is Negative.  Fact Sheet for Patients: EntrepreneurPulse.com.au  Fact Sheet for Healthcare Providers: IncredibleEmployment.be  This test is not yet approved or cleared by the [place] FDA and  has been authorized for detection and/or diagnosis of SARS-CoV-2 by FDA under an Emergency Use Authorization (EUA).  This EUA will remain in effect (meaning this test can be used) for the duration of  the COVID-19 declaration under Section 564(b)(1) of the A ct, 21 U.S.C. section 360bbb-3(b)(1), unless the authorization is terminated or revoked sooner.     Influenza A by PCR NEGATIVE NEGATIVE Final   Influenza B by PCR NEGATIVE NEGATIVE Final    Comment: (NOTE) The Xpert Xpress SARS-CoV-2/FLU/RSV plus assay is intended as an aid in the diagnosis of influenza from Nasopharyngeal swab specimens and should not be used as a sole basis for treatment. Nasal washings and aspirates are unacceptable for Xpert Xpress SARS-CoV-2/FLU/RSV testing.  Fact Sheet for Patients: EntrepreneurPulse.com.au  Fact Sheet for Healthcare Providers: IncredibleEmployment.be  This test is not yet approved or cleared by the [place] and has been authorized for  detection and/or diagnosis of SARS-CoV-2 by FDA under an Emergency Use Authorization (EUA). This EUA will remain in effect (meaning this test can be used) for the duration of the COVID-19 declaration under Section 564(b)(1) of the Act, 21 U.S.C. section 360bbb-3(b)(1), unless the authorization is terminated or revoked.  Performed at [facility], [place] [address] [place], [place] [address]   Blood Culture ID Panel (Reflexed)     Status: Abnormal   Collection Time: 05/22/2022  2:15 PM  Result Value Ref Range Status   Enterococcus faecalis DETECTED (A) NOT DETECTED Final    Comment: CRITICAL RESULT CALLED TO,  READ BACK BY AND VERIFIED WITH: PHARMD J LEGGE [id number] AT 1153 AM  BY CM    Enterococcus Faecium NOT DETECTED NOT DETECTED Final   Listeria monocytogenes NOT DETECTED NOT DETECTED Final   Staphylococcus species NOT DETECTED NOT DETECTED Final   Staphylococcus aureus (BCID) NOT DETECTED NOT DETECTED Final   Staphylococcus epidermidis NOT DETECTED NOT DETECTED Final   Staphylococcus lugdunensis NOT DETECTED NOT DETECTED Final   Streptococcus species NOT DETECTED NOT DETECTED Final   Streptococcus agalactiae NOT DETECTED NOT DETECTED Final   Streptococcus pneumoniae NOT DETECTED NOT DETECTED Final   Streptococcus pyogenes NOT DETECTED NOT DETECTED Final   A.calcoaceticus-baumannii NOT DETECTED NOT DETECTED Final   Bacteroides fragilis NOT DETECTED NOT DETECTED Final   Enterobacterales NOT DETECTED NOT DETECTED Final   Enterobacter cloacae complex NOT DETECTED NOT DETECTED Final   Escherichia coli NOT DETECTED NOT DETECTED Final   Klebsiella aerogenes NOT DETECTED NOT DETECTED Final   Klebsiella oxytoca NOT DETECTED NOT DETECTED Final   Klebsiella pneumoniae NOT DETECTED NOT DETECTED Final   Proteus species NOT DETECTED NOT DETECTED Final   Salmonella species NOT DETECTED NOT DETECTED Final   Serratia marcescens NOT DETECTED NOT DETECTED Final   Haemophilus influenzae NOT DETECTED NOT DETECTED Final   Neisseria meningitidis NOT DETECTED NOT DETECTED Final   Pseudomonas aeruginosa NOT DETECTED NOT DETECTED Final   Stenotrophomonas maltophilia NOT DETECTED NOT DETECTED Final   Candida albicans NOT DETECTED NOT DETECTED Final   Candida auris NOT DETECTED NOT DETECTED Final   Candida glabrata NOT DETECTED NOT DETECTED Final   Candida krusei NOT DETECTED NOT DETECTED Final   Candida parapsilosis NOT DETECTED NOT DETECTED Final   Candida tropicalis NOT DETECTED NOT DETECTED Final   Cryptococcus neoformans/gattii NOT DETECTED NOT DETECTED Final   Vancomycin resistance NOT DETECTED NOT DETECTED  Final    Comment: Performed at [facility], [address] [place], [place] [address]  Respiratory (~20 pathogens) panel by PCR     Status: None   Collection Time: 06/05/2022  2:50 PM   Specimen: Nasopharyngeal Swab; Respiratory  Result Value Ref Range Status   Adenovirus NOT DETECTED NOT DETECTED Final   Coronavirus 229E NOT DETECTED NOT DETECTED Final    Comment: (NOTE) The Coronavirus on the Respiratory Panel, DOES NOT test for the novel  Coronavirus (2019 nCoV)    Coronavirus HKU1 NOT DETECTED NOT DETECTED Final   Coronavirus NL63 NOT DETECTED NOT DETECTED Final   Coronavirus OC43 NOT DETECTED NOT DETECTED Final   Metapneumovirus NOT DETECTED NOT DETECTED Final   Rhinovirus / Enterovirus NOT DETECTED NOT DETECTED Final   Influenza A NOT DETECTED NOT DETECTED Final   Influenza B NOT DETECTED NOT DETECTED Final   Parainfluenza Virus 1 NOT DETECTED NOT DETECTED Final   Parainfluenza Virus 2 NOT DETECTED NOT DETECTED Final   Parainfluenza Virus 3  NOT DETECTED NOT DETECTED Final   Parainfluenza Virus 4 NOT DETECTED NOT DETECTED Final   Respiratory Syncytial Virus NOT DETECTED NOT DETECTED Final   Bordetella pertussis NOT DETECTED NOT DETECTED Final   Bordetella Parapertussis NOT DETECTED NOT DETECTED Final   Chlamydophila pneumoniae NOT DETECTED NOT DETECTED Final   Mycoplasma pneumoniae NOT DETECTED NOT DETECTED Final    Comment: Performed at [facility], [place] [address], [place], [place] [address]  MRSA Next Gen by PCR, Nasal     Status: Abnormal   Collection Time: 05/27/2022  8:26 PM   Specimen: Nasal Mucosa; Nasal Swab  Result Value Ref Range Status   MRSA by PCR Next Gen DETECTED (A) NOT DETECTED Final    Comment: (NOTE) The GeneXpert MRSA Assay (FDA approved for NASAL specimens only), is one component of a comprehensive MRSA colonization surveillance program. It is not intended to diagnose MRSA infection nor to guide or monitor treatment for MRSA infections. Test  performance is not FDA approved in patients less than 35 years old. Performed at [facility], [place] [address] [place], [place] [address]   Culture, blood (Routine X 2) w Reflex to ID Panel     Status: None (Preliminary result)   Collection Time: 05/18/22  7:13 AM   Specimen: BLOOD LEFT ARM  Result Value Ref Range Status   Specimen Description   Final    BLOOD LEFT ARM AEROBIC BOTTLE ONLY Performed at [place] [address] [place], [place] [address]    Special Requests   Final    NONE Performed at [facility], [place] [address] [place], [place] [address]    Culture   Final    NO GROWTH 2 DAYS Performed at [facility] [address] [place], [place] [address]    Report Status PENDING  Incomplete  Culture, blood (Routine X 2) w Reflex to ID Panel     Status: None (Preliminary result)   Collection Time: 05/18/22  7:13 AM   Specimen: BLOOD LEFT ARM  Result Value Ref Range Status   Specimen Description   Final    BLOOD LEFT ARM AEROBIC BOTTLE ONLY Performed at [facility], [place] [address] [place], [place] [address]    Special Requests   Final    NONE Performed at [facility], [place] [address], [place], [place] [address]    Culture   Final    NO GROWTH 2 DAYS Performed at [place] [address] [place], [place] [address]    Report Status PENDING  Incomplete     Radiology Studies: No results found.  Scheduled Meds:  Chlorhexidine Gluconate Cloth  6 each Topical Daily   docusate sodium  100 mg Oral BID   feeding supplement  237 mL Oral BID BM   ferrous sulfate  325 mg Oral Q breakfast   finasteride  5 mg Oral Daily   furosemide  40 mg Intravenous Daily   multivitamin with minerals  1 tablet Oral Daily   mupirocin ointment  1 Application Nasal BID   mouth rinse  15 mL Mouth Rinse 4 times per day   pantoprazole  40 mg Oral BID   predniSONE  10 mg Oral QAC breakfast    pyridostigmine  60 mg Oral Q8H   tamsulosin  0.4 mg Oral Daily   Continuous Infusions:  ampicillin (OMNIPEN) IV Stopped (05/13/2022 6644)   cefTRIAXone (ROCEPHIN)  IV Stopped (  06/06/2022 1010)   famotidine (PEPCID) IV Stopped (05/20/22 1003)     LOS: 6 days   Darliss Cheney, MD [facility]  06/05/2022, 10:30 AM   *Please note that this is a verbal dictation therefore any spelling or grammatical errors are due to the "[place] [employer]" system interpretation.  Please page via [place] and do not message via secure chat for urgent patient care matters. Secure chat can be used for non urgent patient care matters.  How to contact the [facility] Attending or Consulting provider [place] or covering provider during after hours [place], for this patient?  Check the care team in [facility] and look for a) attending/consulting [facility] provider listed and b) the [facility] team listed. Page or secure chat [id number]. Log into [email or web address] and use [place]'s universal password to access. If you do not have the password, please contact the hospital operator. Locate the [facility] provider you are looking for under [facility] and page to a number that you can be directly reached. If you still have difficulty reaching the provider, please page the [facility] (Director on Call) for the Hospitalists listed on amion for assistance.

## 2022-05-22 DIAGNOSIS — J189 Pneumonia, unspecified organism: Secondary | ICD-10-CM

## 2022-05-22 DIAGNOSIS — U071 COVID-19: Secondary | ICD-10-CM | POA: Diagnosis not present

## 2022-05-22 DIAGNOSIS — A498 Other bacterial infections of unspecified site: Secondary | ICD-10-CM | POA: Diagnosis not present

## 2022-05-22 MED ORDER — FAMOTIDINE 20 MG PO TABS
20.0000 mg | ORAL_TABLET | Freq: Every day | ORAL | Status: DC
  Administered 2022-05-22 – 2022-05-27 (×6): 20 mg via ORAL
  Filled 2022-05-22 (×7): qty 1

## 2022-05-22 MED ORDER — ALBUMIN HUMAN 25 % IV SOLN
25.0000 g | Freq: Once | INTRAVENOUS | Status: AC
  Administered 2022-05-22: 25 g via INTRAVENOUS
  Filled 2022-05-22: qty 100

## 2022-05-22 NOTE — Hospital Course (Addendum)
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  He has had multiple prolonged hospitalizations since October 2023.  After last hospitalization, he was discharged to rehab and had mild improvement over approximately 2 weeks but then developed shortness of breath and was sent back to the hospital.  He was found to be positive for COVID-19. He has had gradual but progressive decline in overall since initial hospitalizations. He has been followed by palliative care as well.  Due to poor functional status and failure to thrive, GOC discussions were initiated and carried on throughout hospitalization as well. Decision by family was made to transition patient to comfort care with inpatient hospice due to progressive decline. He was placed on a Dilaudid infusion for comfort with improvement in his persistent dyspnea and tachypnea.  He had ongoing expected decline and passed naturally on Jun 18, 2022 at 2014.

## 2022-05-22 NOTE — TOC Progression Note (Signed)
Transition of Care [facility]) - Progression Note    Patient Details  Name: Daniel Sharp MRN: [id number] Date of Birth: 08-25-1932  Transition of Care [facility]) CM/SW [place], RN Phone Number: 05/22/2022, 12:22 PM  Clinical Narrative:   Patient from [facility] with [facility] for home hospice.   TOC following for discharge needs.         Barriers to Discharge: Continued Medical Work up  Expected Discharge Plan and Services                                                 Social Determinants of Health (SDOH) Interventions    Readmission Risk Interventions     No data to display

## 2022-05-22 NOTE — Progress Notes (Signed)
Progress Note   Patient: Daniel Sharp [id number] DOB: 04/10/1933 DOA: 05/18/2022     7 DOS: the patient was seen and examined on 05/22/2022   Brief hospital course: 88 y.o. male with medical history significant of hypertension, paroxysmal atrial fibrillation, chronic hypercapnic respiratory failure, BiPAP use at night, history of mitral valve repair, myasthenia gravis and many other comorbidities was brought into the emergency department from nursing home due to worsening shortness of breath and altered mental status.  Reportedly patient was tested positive for COVID-pneumonia 2 days ago on Monday.  He was not started on any medications Pt arrived needing bipap initially.  Assessment and Plan: Acute on chronic respiratory failure with hypoxia and hypercapnia secondary to COVID-pneumonia as well as possible superimposed bacterial pneumonia/acute on chronic diastolic congestive heart failure: Patient does not meet sepsis criteria, sepsis ruled out.  Patient seems to have combination of COVID-pneumonia, bacterial pneumonia and acute on chronic diastolic congestive heart failure due to elevated BNP.  Patient completed 5 days of remdesivir.  Patient on Rocephin and ampicillin.  Continue current antibiotics.  Continue IV Lasix 40 g daily.   -Clinically seems improving, down to [facility] -BLE markedly edematous. Have ordered dose of albumin   Acute metabolic encephalopathy: He was very confused and unable to speak yesterday and he was on BiPAP due to all of the above.  -Conversing appropriately this AM   Recurrent E faecalis bacteremia: Patient was admitted in October when he was diagnosed with rectal adenocarcinoma and double bacteremia shortly after colonoscopy.  He completed 14 days of antibiotics.  His blood cultures growing Enterococcus fecaliths again.  ID has been consulted.  Rocephin was continued and ampicillin added on 05/17/2022.  ID recommends ruling out endocarditis.  TTE was done which did not  show any endocarditis but TEE is required.   -Cardiology consulted for TEE, planned for tomorrow   BPH: Continue Flomax.   History of paroxysmal atrial fibrillation: Patient not on any rate control medications or any anticoagulation.    Hyperkalemia: Resolved.   CKD stage IIIa -recheck cmp in AM   An episode of nosebleed/blood at the penile meatus:  -recent transient episode of bleeding. Now resolved. Lovenox was d/c'd -On SCD's for dvt prophylaxis   Hypernatremia: Very mild and improving.   Anemia of chronic disease:  Hemodynamically stable   Acute thrombocytopenia: Likely secondary to acute illness.  Lovenox discontinued per above -follow CBC trends      Subjective: Without complaints this AM. Seems mildly confused  Physical Exam: Vitals:   05/22/22 0912 05/22/22 1200 05/22/22 1334 05/22/22 1400  BP:  (!) 107/44  112/64  Pulse:  86    Resp:  (!) 23  (!) 22  Temp: (!) 97.5 F (36.4 C)  97.8 F (36.6 C)   TempSrc: Oral  Oral   SpO2:  95%    Weight:      Height:       General exam: Awake, laying in bed, in nad Respiratory system: Normal respiratory effort, no wheezing Cardiovascular system: regular rate, s1, s2 Gastrointestinal system: Soft, nondistended, positive BS Central nervous system: CN2-12 grossly intact, strength intact Extremities: Perfused, no clubbing Skin: Normal skin turgor, no notable skin lesions seen Psychiatry: Mood normal // no visual hallucinations   Data Reviewed:  There are no new results to review at this time.  Family Communication: Pt in room, family not at bedside  Disposition: Status is: Inpatient Remains inpatient appropriate because: Severity of illness  Planned Discharge Destination:  Pending  PT eval    Author: Marylu Lund, MD 05/22/2022 4:08 PM  For on call review [email or web address].

## 2022-05-22 NOTE — Progress Notes (Signed)
TEE tentatively scheduled for tomorrow. Team D APP will come by tomorrow to review clinical status and do consent/orders if appropriate ([employer] plans to relay this request to team D APP coming on board tomorrow).

## 2022-05-23 ENCOUNTER — Inpatient Hospital Stay: Payer: Medicare HMO

## 2022-05-23 DIAGNOSIS — U071 COVID-19: Secondary | ICD-10-CM | POA: Diagnosis not present

## 2022-05-23 DIAGNOSIS — J189 Pneumonia, unspecified organism: Secondary | ICD-10-CM | POA: Diagnosis not present

## 2022-05-23 DIAGNOSIS — A498 Other bacterial infections of unspecified site: Secondary | ICD-10-CM | POA: Diagnosis not present

## 2022-05-23 LAB — CBC
HCT: 28 % — ABNORMAL LOW (ref 39.0–52.0)
HCT: 28.6 % — ABNORMAL LOW (ref 39.0–52.0)
Hemoglobin: 7.8 g/dL — ABNORMAL LOW (ref 13.0–17.0)
Hemoglobin: 8 g/dL — ABNORMAL LOW (ref 13.0–17.0)
MCH: 29.1 pg (ref 26.0–34.0)
MCH: 29.5 pg (ref 26.0–34.0)
MCHC: 27.9 g/dL — ABNORMAL LOW (ref 30.0–36.0)
MCHC: 28 g/dL — ABNORMAL LOW (ref 30.0–36.0)
MCV: 104.5 fL — ABNORMAL HIGH (ref 80.0–100.0)
MCV: 105.5 fL — ABNORMAL HIGH (ref 80.0–100.0)
Platelets: 113 10*3/uL — ABNORMAL LOW (ref 150–400)
Platelets: 134 10*3/uL — ABNORMAL LOW (ref 150–400)
RBC: 2.68 MIL/uL — ABNORMAL LOW (ref 4.22–5.81)
RBC: 2.71 MIL/uL — ABNORMAL LOW (ref 4.22–5.81)
RDW: 16.3 % — ABNORMAL HIGH (ref 11.5–15.5)
RDW: 16.4 % — ABNORMAL HIGH (ref 11.5–15.5)
WBC: 6.6 10*3/uL (ref 4.0–10.5)
WBC: 7 10*3/uL (ref 4.0–10.5)
nRBC: 0 % (ref 0.0–0.2)
nRBC: 0 % (ref 0.0–0.2)

## 2022-05-23 LAB — CULTURE, BLOOD (ROUTINE X 2)
Culture: NO GROWTH
Culture: NO GROWTH

## 2022-05-23 LAB — COMPREHENSIVE METABOLIC PANEL
ALT: 16 U/L (ref 0–44)
AST: 16 U/L (ref 15–41)
Albumin: 2.9 g/dL — ABNORMAL LOW (ref 3.5–5.0)
Alkaline Phosphatase: 76 U/L (ref 38–126)
Anion gap: 5 (ref 5–15)
BUN: 50 mg/dL — ABNORMAL HIGH (ref 8–23)
CO2: 36 mmol/L — ABNORMAL HIGH (ref 22–32)
Calcium: 10.1 mg/dL (ref 8.9–10.3)
Chloride: 101 mmol/L (ref 98–111)
Creatinine, Ser: 1.63 mg/dL — ABNORMAL HIGH (ref 0.61–1.24)
GFR, Estimated: 41 mL/min — ABNORMAL LOW (ref >60)
Glucose, Bld: 102 mg/dL — ABNORMAL HIGH (ref 70–99)
Potassium: 4.2 mmol/L (ref 3.5–5.1)
Sodium: 142 mmol/L (ref 135–145)
Total Bilirubin: 0.6 mg/dL (ref 0.3–1.2)
Total Protein: 6.3 g/dL — ABNORMAL LOW (ref 6.5–8.1)

## 2022-05-23 MED ORDER — MELATONIN 3 MG PO TABS
3.0000 mg | ORAL_TABLET | Freq: Every day | ORAL | Status: AC
  Administered 2022-05-23 (×2): 3 mg via ORAL
  Filled 2022-05-23 (×2): qty 1

## 2022-05-23 MED ORDER — QUETIAPINE FUMARATE 50 MG PO TABS
25.0000 mg | ORAL_TABLET | Freq: Every day | ORAL | Status: DC
  Administered 2022-05-23: 25 mg via ORAL
  Filled 2022-05-23: qty 1

## 2022-05-23 MED ORDER — ALPRAZOLAM 0.25 MG PO TABS
0.2500 mg | ORAL_TABLET | Freq: Once | ORAL | Status: AC
  Administered 2022-05-23: 0.25 mg via ORAL
  Filled 2022-05-23: qty 1

## 2022-05-23 MED ORDER — SODIUM CHLORIDE 0.9 % IV SOLN: INTRAVENOUS | Status: DC | PRN

## 2022-05-23 NOTE — H&P (View-Only) (Signed)
Progress Note   Patient: Daniel Sharp [id number] DOB: [birth year] DOA: 05/11/2022     8 DOS: the patient was seen and examined on 05/23/2022   Brief hospital course: 88 y.o. male with medical history significant of hypertension, paroxysmal atrial fibrillation, chronic hypercapnic respiratory failure, BiPAP use at night, history of mitral valve repair, myasthenia gravis and many other comorbidities was brought into the emergency department from nursing home due to worsening shortness of breath and altered mental status.  Reportedly patient was tested positive for COVID-pneumonia 2 days ago on Monday.  He was not started on any medications Pt arrived needing bipap initially.  Assessment and Plan: Acute on chronic respiratory failure with hypoxia and hypercapnia secondary to COVID-pneumonia as well as possible superimposed bacterial pneumonia/acute on chronic diastolic congestive heart failure: Patient does not meet sepsis criteria, sepsis ruled out.  Patient seems to have combination of COVID-pneumonia, bacterial pneumonia and acute on chronic diastolic congestive heart failure due to elevated BNP.  Patient completed 5 days of remdesivir.  Patient on Rocephin and ampicillin.  Continue current antibiotics.  Was given IV lasix, now on hold given rising Cr -Currently on 2LNC -BLE markedly edematous despite lasix. Given trial of albumin -will place TEDs   Acute metabolic encephalopathy:  -More tired appearing today. -staff reports pt did not sleep at all last night and finally slept around 5AM -recent EKG reviewed, unremarkable QTc. Will order trial of seroquel at night   Recurrent E faecalis bacteremia: Patient was admitted in October when he was diagnosed with rectal adenocarcinoma and double bacteremia shortly after colonoscopy.  He completed 14 days of antibiotics.  His blood cultures growing Enterococcus fecaliths again.  ID has been consulted.  Rocephin was continued and ampicillin added on  05/17/2022.  ID recommends ruling out endocarditis.  TTE was done which did not show any endocarditis but TEE is required.   -Cardiology consulted for TEE, pending   BPH: Continue Flomax.   History of paroxysmal atrial fibrillation: Patient not on any rate control medications or any anticoagulation.    Hyperkalemia: Resolved.   CKD stage IIIa -recheck cmp in AM   An episode of nosebleed/blood at the penile meatus:  -recent transient episode of bleeding. Now resolved. Lovenox was d/c'd -On SCD's for dvt prophylaxis   Hypernatremia: Very mild and improving.   Anemia of chronic disease:  Hemodynamically stable Has been receiving iron supplementation PTA Last iron level of 23 on 11/1. Will repeat in AM   Acute thrombocytopenia: Likely secondary to acute illness.  Lovenox discontinued per above -follow CBC trends      Subjective: Unable to assess given mentation  Physical Exam: Vitals:   05/23/22 1210 05/23/22 1300 05/23/22 1400 05/23/22 1500  BP:  (!) 115/48  (!) 111/50  Pulse: 75 76 85 (!) 120  Resp: (!) 47 (!) 40 (!) 41 (!) 38  Temp:      TempSrc:      SpO2: 95% 100% 99% 99%  Weight:      Height:       General exam: arousable, appears drowsy, laying in bed, in nad Respiratory system: Normal respiratory effort, no wheezing Cardiovascular system: regular rate, s1, s2 Gastrointestinal system: Soft, nondistended, positive BS Central nervous system: CN2-12 grossly intact, strength intact Extremities: Perfused, no clubbing Skin: Normal skin turgor, no notable skin lesions seen Psychiatry: Mood normal // no visual hallucinations   Data Reviewed:  There are no new results to review at this time.  Family Communication: Pt in room,  family at bedside  Disposition: Status is: Inpatient Remains inpatient appropriate because: Severity of illness  Planned Discharge Destination:  Pending PT eval    Author: Marylu Lund, MD 05/23/2022 4:11 PM  For on call review  [email or web address].

## 2022-05-23 NOTE — Progress Notes (Signed)
Progress Note   Patient: Daniel Sharp [id number] DOB: 07-12-1932 DOA: 05/17/2022     8 DOS: the patient was seen and examined on 05/23/2022   Brief hospital course: 88 y.o. male with medical history significant of hypertension, paroxysmal atrial fibrillation, chronic hypercapnic respiratory failure, BiPAP use at night, history of mitral valve repair, myasthenia gravis and many other comorbidities was brought into the emergency department from nursing home due to worsening shortness of breath and altered mental status.  Reportedly patient was tested positive for COVID-pneumonia 2 days ago on Monday.  He was not started on any medications Pt arrived needing bipap initially.  Assessment and Plan: Acute on chronic respiratory failure with hypoxia and hypercapnia secondary to COVID-pneumonia as well as possible superimposed bacterial pneumonia/acute on chronic diastolic congestive heart failure: Patient does not meet sepsis criteria, sepsis ruled out.  Patient seems to have combination of COVID-pneumonia, bacterial pneumonia and acute on chronic diastolic congestive heart failure due to elevated BNP.  Patient completed 5 days of remdesivir.  Patient on Rocephin and ampicillin.  Continue current antibiotics.  Was given IV lasix, now on hold given rising Cr -Currently on 2LNC -BLE markedly edematous despite lasix. Given trial of albumin -will place TEDs   Acute metabolic encephalopathy:  -More tired appearing today. -staff reports pt did not sleep at all last night and finally slept around 5AM -recent EKG reviewed, unremarkable QTc. Will order trial of seroquel at night   Recurrent E faecalis bacteremia: Patient was admitted in October when he was diagnosed with rectal adenocarcinoma and double bacteremia shortly after colonoscopy.  He completed 14 days of antibiotics.  His blood cultures growing Enterococcus fecaliths again.  ID has been consulted.  Rocephin was continued and ampicillin added on  05/17/2022.  ID recommends ruling out endocarditis.  TTE was done which did not show any endocarditis but TEE is required.   -Cardiology consulted for TEE, pending   BPH: Continue Flomax.   History of paroxysmal atrial fibrillation: Patient not on any rate control medications or any anticoagulation.    Hyperkalemia: Resolved.   CKD stage IIIa -recheck cmp in AM   An episode of nosebleed/blood at the penile meatus:  -recent transient episode of bleeding. Now resolved. Lovenox was d/c'd -On SCD's for dvt prophylaxis   Hypernatremia: Very mild and improving.   Anemia of chronic disease:  Hemodynamically stable Has been receiving iron supplementation PTA Last iron level of 23 on 11/1. Will repeat in AM   Acute thrombocytopenia: Likely secondary to acute illness.  Lovenox discontinued per above -follow CBC trends      Subjective: Unable to assess given mentation  Physical Exam: Vitals:   05/23/22 1210 05/23/22 1300 05/23/22 1400 05/23/22 1500  BP:  (!) 115/48  (!) 111/50  Pulse: 75 76 85 (!) 120  Resp: (!) 47 (!) 40 (!) 41 (!) 38  Temp:      TempSrc:      SpO2: 95% 100% 99% 99%  Weight:      Height:       General exam: arousable, appears drowsy, laying in bed, in nad Respiratory system: Normal respiratory effort, no wheezing Cardiovascular system: regular rate, s1, s2 Gastrointestinal system: Soft, nondistended, positive BS Central nervous system: CN2-12 grossly intact, strength intact Extremities: Perfused, no clubbing Skin: Normal skin turgor, no notable skin lesions seen Psychiatry: Mood normal // no visual hallucinations   Data Reviewed:  There are no new results to review at this time.  Family Communication: Pt in room,  family at bedside  Disposition: Status is: Inpatient Remains inpatient appropriate because: Severity of illness  Planned Discharge Destination:  Pending PT eval    Author: Marylu Lund, MD 05/23/2022 4:11 PM  For on call review  [email or web address].

## 2022-05-23 NOTE — Progress Notes (Signed)
    [facility] for Infectious Disease   Reason for visit: Follow up on bacteremia  Interval History: repeat blood cultures ngtd  Physical Exam: Constitutional:  Vitals:   05/23/22 1210 05/23/22 1300  BP:  (!) 115/48  Pulse: 75 76  Resp: (!) 47 (!) 40  Temp:    SpO2: 95% 100%   No changes.  Will follow up after completion of TEE.

## 2022-05-23 NOTE — Progress Notes (Signed)
PT Cancellation Note  Patient Details Name: Daniel Sharp MRN: [id number] DOB: 1933/04/18   Cancelled Treatment:    Reason Eval/Treat Not Completed: Medical issues which prohibited therapy,  RN reports patient less alert, wants MD to assess before  PT  is  initiated. Will follow. Son in room and expresses concern that  PT would start today. [place] Office [phone] Weekend [phone]    Claretha Cooper 05/23/2022, 11:39 AM

## 2022-05-23 NOTE — TOC Progression Note (Signed)
Transition of Care [facility]) - Progression Note    Patient Details  Name: Daniel Sharp MRN: [id number] Date of Birth: [birth year]  Transition of Care [facility]) CM/SW [place], RN Phone Number: 05/23/2022, 1:12 PM  Clinical Narrative:   This RNCM received call from [facility] with [facility] who reports they are following patient for out patient palliative services. Karena Addison reports an [facility] provider last saw patient on 06/01/2022. Per chart review patient also has [facility]. This RNCM spoke with Hoyle Sauer with [facility] who reports currently [facility] is not following this patient for any HH or hospice services.  This RNCM spoke with patient's son [name] who reports he has received calls from both agencies and has not made a decision. Dirk reports he awaiting status from the MD and plans to have his father return to [place]. This RNCM provided her contact information and will continue to follow for TOC needs.   This RNCM left a voicemail message for [place] with [facility] to confirm level of care, awaiting a call back.     Expected Discharge Plan: [place] Barriers to Discharge: Continued Medical Work up  Expected Discharge Plan and Services Expected Discharge Plan: [place] In-house Referral: NA Discharge Planning Services: CM Consult   Living arrangements for the past 2 months: [place]                 DME Arranged: N/A DME Agency: NA       HH Arranged: NA HH Agency: NA         Social Determinants of Health (SDOH) Interventions    Readmission Risk Interventions     No data to display

## 2022-05-23 NOTE — Progress Notes (Signed)
Nutrition Follow-up  DOCUMENTATION CODES:  Severe malnutrition in context of acute illness/injury  INTERVENTION:  -Continue heart healthy diet as tolerated, consider liberalizing if po intake is poor -Continue Ensure Plus HP or Ensure Enlive BID (350kcal and 20g protein) -Continue MVI  NUTRITION DIAGNOSIS:  Severe Malnutrition related to acute illness (covid) as evidenced by moderate fat depletion, moderate muscle depletion, edema (decreased intake but unable to provide details).  GOAL:  Patient will meet greater than or equal to 90% of their needs  MONITOR:  PO intake, Diet advancement, Supplement acceptance  REASON FOR ASSESSMENT:  Consult Enteral/tube feeding initiation and management  ASSESSMENT:  Pt is an 88yo M with PMH of HTN, A fib, chronic blood loss anemia, kidney cancer s/p L nephrectomy, chronic right sided heart failure, BPH, CKD 3, ocular myasthenia gravis, and arthritis who was recently admitted with hematuria and gi bleed. Presents this admission for worsening shortness of breath and altered mental status found to be covid positive.  Pt's diet advanced to heart healthy on 11/9. Pt with variable po intake throughout length of stay, documented 25-80% of meals. He has adequate acceptance of ONS, but is too lethargic to drink this afternoon. Ensure Plus HP or Ensure Enlive will provide 350kcal and 20g protein/bottle.   Labs reviewed: BG:102, BUN:50, Cr:1.63, GFR:41  Diet Order:   Diet Order             Diet Heart Room service appropriate? Yes with Assist; Fluid consistency: Thin  Diet effective now                   EDUCATION NEEDS:  Education needs have been addressed  Skin:  Skin Assessment: Skin Integrity Issues: Skin Integrity Issues:: Stage III DTI: coccyx Stage I: heel Stage III: sacrum  Last BM:  11/16  Height:  Ht Readings from Last 1 Encounters:  06/06/2022 '6\' 1"'$  (1.854 m)    Weight:  Wt Readings from Last 1 Encounters:  05/22/22 92.8 kg     BMI:  Body mass index is 26.99 kg/m.  Estimated Nutritional Needs:  Kcal:  [id number] Protein:  100-130g Fluid:  2154m  KCandise Bowens MS, RD, LDN, CNSC See AMiON for contact information

## 2022-05-23 NOTE — Progress Notes (Signed)
OT Cancellation Note  Patient Details Name: Daniel Sharp MRN: [id number] DOB: 1933/06/05   Cancelled Treatment:    Reason Eval/Treat Not Completed: Fatigue/lethargy limiting ability to participate. Per pt's nurse, the pt is currently with lethargy & may have difficulty actively participating in therapy. The pt's son was also present and asked for therapy to hold for now.    Leota Sauers, OTR/L 05/23/2022, 11:33 AM

## 2022-05-23 NOTE — Progress Notes (Signed)
    [facility] has been requested to perform a transesophageal echocardiogram on 11/17 for bacteremia.  After careful review of history and examination, the risks and benefits of transesophageal echocardiogram have been explained including risks of esophageal damage, perforation (1:10,000 risk), bleeding, pharyngeal hematoma as well as other potential complications associated with conscious sedation including aspiration, arrhythmia, respiratory failure and death. Alternatives to treatment were discussed, questions were answered. Patient is willing to proceed but is not able to give consent.  The procedure was discussed with his son, [name], and he gave verbal consent.   [name] should be contacted with results, phone number [phone].  Rosaria Ferries, PA-C 05/23/2022 2:33 PM

## 2022-05-23 NOTE — Progress Notes (Signed)
Pt place on AVAPS for rest.

## 2022-05-24 ENCOUNTER — Inpatient Hospital Stay (HOSPITAL_COMMUNITY): Payer: Medicare HMO | Admitting: Anesthesiology

## 2022-05-24 ENCOUNTER — Encounter (HOSPITAL_COMMUNITY): Admission: EM | Disposition: E | Payer: Self-pay | Source: Skilled Nursing Facility | Attending: Internal Medicine

## 2022-05-24 ENCOUNTER — Inpatient Hospital Stay (HOSPITAL_COMMUNITY): Payer: Medicare HMO

## 2022-05-24 ENCOUNTER — Encounter (HOSPITAL_COMMUNITY): Payer: Self-pay | Admitting: Family Medicine

## 2022-05-24 DIAGNOSIS — Z87891 Personal history of nicotine dependence: Secondary | ICD-10-CM

## 2022-05-24 DIAGNOSIS — J189 Pneumonia, unspecified organism: Secondary | ICD-10-CM | POA: Diagnosis not present

## 2022-05-24 DIAGNOSIS — I700 Atherosclerosis of aorta: Secondary | ICD-10-CM | POA: Diagnosis not present

## 2022-05-24 DIAGNOSIS — I517 Cardiomegaly: Secondary | ICD-10-CM

## 2022-05-24 DIAGNOSIS — I083 Combined rheumatic disorders of mitral, aortic and tricuspid valves: Secondary | ICD-10-CM | POA: Diagnosis not present

## 2022-05-24 DIAGNOSIS — R7881 Bacteremia: Secondary | ICD-10-CM

## 2022-05-24 DIAGNOSIS — I509 Heart failure, unspecified: Secondary | ICD-10-CM | POA: Diagnosis not present

## 2022-05-24 DIAGNOSIS — Z952 Presence of prosthetic heart valve: Secondary | ICD-10-CM

## 2022-05-24 DIAGNOSIS — I110 Hypertensive heart disease with heart failure: Secondary | ICD-10-CM

## 2022-05-24 DIAGNOSIS — A498 Other bacterial infections of unspecified site: Secondary | ICD-10-CM | POA: Diagnosis not present

## 2022-05-24 DIAGNOSIS — U071 COVID-19: Secondary | ICD-10-CM | POA: Diagnosis not present

## 2022-05-24 HISTORY — PX: TEE WITHOUT CARDIOVERSION: SHX5443

## 2022-05-24 LAB — COMPREHENSIVE METABOLIC PANEL
ALT: 14 U/L (ref 0–44)
AST: 13 U/L — ABNORMAL LOW (ref 15–41)
Albumin: 2.7 g/dL — ABNORMAL LOW (ref 3.5–5.0)
Alkaline Phosphatase: 74 U/L (ref 38–126)
Anion gap: 6 (ref 5–15)
BUN: 54 mg/dL — ABNORMAL HIGH (ref 8–23)
CO2: 35 mmol/L — ABNORMAL HIGH (ref 22–32)
Calcium: 10.2 mg/dL (ref 8.9–10.3)
Chloride: 98 mmol/L (ref 98–111)
Creatinine, Ser: 1.56 mg/dL — ABNORMAL HIGH (ref 0.61–1.24)
GFR, Estimated: 43 mL/min — ABNORMAL LOW (ref >60)
Glucose, Bld: 90 mg/dL (ref 70–99)
Potassium: 4 mmol/L (ref 3.5–5.1)
Sodium: 139 mmol/L (ref 135–145)
Total Bilirubin: 0.8 mg/dL (ref 0.3–1.2)
Total Protein: 5.9 g/dL — ABNORMAL LOW (ref 6.5–8.1)

## 2022-05-24 LAB — BLOOD GAS, ARTERIAL
Acid-Base Excess: 11.3 mmol/L — ABNORMAL HIGH (ref 0.0–2.0)
Bicarbonate: 40.6 mmol/L — ABNORMAL HIGH (ref 20.0–28.0)
O2 Saturation: 99.4 %
Patient temperature: 37
pCO2 arterial: 77 mmHg (ref 32–48)
pH, Arterial: 7.33 — ABNORMAL LOW (ref 7.35–7.45)
pO2, Arterial: 69 mmHg — ABNORMAL LOW (ref 83–108)

## 2022-05-24 LAB — ECHO TEE
Area-P 1/2: 3.11 cm2
MV M vel: 4.12 m/s
MV Peak grad: 67.8 mmHg
Radius: 0.96 cm

## 2022-05-24 LAB — IRON AND TIBC
Iron: 36 ug/dL — ABNORMAL LOW (ref 45–182)
Saturation Ratios: 25 % (ref 17.9–39.5)
TIBC: 146 ug/dL — ABNORMAL LOW (ref 250–450)
UIBC: 110 ug/dL

## 2022-05-24 LAB — CBC
HCT: 25.6 % — ABNORMAL LOW (ref 39.0–52.0)
Hemoglobin: 7.3 g/dL — ABNORMAL LOW (ref 13.0–17.0)
MCH: 29.4 pg (ref 26.0–34.0)
MCHC: 28.5 g/dL — ABNORMAL LOW (ref 30.0–36.0)
MCV: 103.2 fL — ABNORMAL HIGH (ref 80.0–100.0)
Platelets: 125 10*3/uL — ABNORMAL LOW (ref 150–400)
RBC: 2.48 MIL/uL — ABNORMAL LOW (ref 4.22–5.81)
RDW: 16.5 % — ABNORMAL HIGH (ref 11.5–15.5)
WBC: 6.5 10*3/uL (ref 4.0–10.5)
nRBC: 0 % (ref 0.0–0.2)

## 2022-05-24 LAB — AMMONIA: Ammonia: 42 umol/L — ABNORMAL HIGH (ref 9–35)

## 2022-05-24 SURGERY — ECHOCARDIOGRAM, TRANSESOPHAGEAL
Anesthesia: Monitor Anesthesia Care

## 2022-05-24 MED ORDER — PHENYLEPHRINE 80 MCG/ML (10ML) SYRINGE FOR IV PUSH (FOR BLOOD PRESSURE SUPPORT)
PREFILLED_SYRINGE | INTRAVENOUS | Status: DC | PRN
  Administered 2022-05-24 (×2): 40 ug via INTRAVENOUS

## 2022-05-24 MED ORDER — FUROSEMIDE 10 MG/ML IJ SOLN
20.0000 mg | Freq: Once | INTRAMUSCULAR | Status: AC
  Administered 2022-05-24: 20 mg via INTRAVENOUS
  Filled 2022-05-24: qty 2

## 2022-05-24 MED ORDER — PROPOFOL 500 MG/50ML IV EMUL
INTRAVENOUS | Status: DC | PRN
  Administered 2022-05-24: 50 ug/kg/min via INTRAVENOUS

## 2022-05-24 MED ORDER — QUETIAPINE FUMARATE 50 MG PO TABS
25.0000 mg | ORAL_TABLET | Freq: Every day | ORAL | Status: DC
  Administered 2022-05-24 – 2022-05-28 (×5): 25 mg via ORAL
  Filled 2022-05-24 (×5): qty 1

## 2022-05-24 MED ORDER — SODIUM CHLORIDE 0.9 % IV SOLN: INTRAVENOUS | Status: DC

## 2022-05-24 NOTE — Progress Notes (Signed)
  Echocardiogram 2D Echocardiogram has been performed.  Daniel Sharp 05/17/2022, 2:03 PM

## 2022-05-24 NOTE — Anesthesia Preprocedure Evaluation (Addendum)
Anesthesia Evaluation  Patient identified by MRN, date of birth, ID band Patient awake    Reviewed: Allergy & Precautions, NPO status , Patient's Chart, lab work & pertinent test results  History of Anesthesia Complications Negative for: history of anesthetic complications  Airway Mallampati: II  TM Distance: >3 FB Neck ROM: Full    Dental  (+) Poor Dentition, Loose, Chipped,    Pulmonary shortness of breath, pneumonia, former smoker   breath sounds clear to auscultation + decreased breath sounds      Cardiovascular hypertension, Pt. on medications (-) angina +CHF  + dysrhythmias Atrial Fibrillation + Valvular Problems/Murmurs (s/p MV repair) MR  Rhythm:Irregular Rate:Tachycardia + Systolic murmurs Echo 33/5456  1. Limited Echo to evaluate for infective endocarditis   2. Mitral valve repair s/p annuloplasty ring in 2005   3. No evidence of vegetations on mitral valve or aortic valve. Consider TEE if clinical suspicion is high.   4. Left ventricular ejection fraction, by estimation, is 55 to 60%. The left ventricle has normal function.   5. The mitral valve is degenerative. Mild mitral regurgitation. No mitral stenosis.   6. The aortic valve is normal in structure. Aortic valve regurgitation is trivial. No aortic stenosis is present.   7. The inferior vena cava is dilated in size with <50% respiratory variability, suggesting right atrial pressure of 15 mmHg.   Comparison(s): No significant change from prior study.    Echo 04/2022 1. Limited study without doppler; s/p MV repair and MAC/subvalvular calcification noted but no obvious vegetations.   2. Left ventricular ejection fraction, by estimation, is 65 to 70%. The left ventricle has normal function. The left ventricle has no regional wall motion abnormalities.   3. Right ventricular systolic function is normal. The right ventricular size is normal.   4. Left atrial size was  moderately dilated.   5. Right atrial size was mildly dilated.   6. The mitral valve has been repaired/replaced. Procedure Date: 2005.   7. The aortic valve is tricuspid.    Echo 04/2022  1. Left ventricular ejection fraction, by estimation, is 55%. The left ventricle has low normal function. The left ventricle has no regional wall motion abnormalities. There is severe concentric left ventricular hypertrophy. Left ventricular diastolic parameters are indeterminate.   2. Right ventricular systolic function is normal. The right ventricular size is mildly enlarged. There is normal pulmonary artery systolic pressure.   3. Left atrial size was moderately dilated.   4. Large pleural effusion in the left lateral region.   5. The mitral valve has been repaired/replaced. Mild to moderate mitral valve regurgitation and eccentric, best seen in subcostal views   6. The aortic valve is tricuspid. Aortic valve regurgitation is mild.   Comparison(s): Similar MR; has had intermittent atrial fibrillation through this study.      09/2021 ECHO: LVEF 53%, mild LV dilation, mod decreased RVF, trace MR s/p MV repair and annuloplasty ring   Neuro/Psych Ocular myasthenia: prednisone (last dose yesterday)  Neuromuscular disease    GI/Hepatic negative GI ROS, Neg liver ROS,,,  Endo/Other  negative endocrine ROS    Renal/GU Renal disease     Musculoskeletal  (+) Arthritis ,    Abdominal   Peds  Hematology  (+) Blood dyscrasia, anemia Coumadin   Anesthesia Other Findings   Reproductive/Obstetrics                             Anesthesia Physical  Anesthesia Plan  ASA: 4  Anesthesia Plan: MAC   Post-op Pain Management: Minimal or no pain anticipated   Induction: Intravenous  PONV Risk Score and Plan: 2 and Propofol infusion  Airway Management Planned:   Additional Equipment: None  Intra-op Plan:   Post-operative Plan:   Informed Consent: I have reviewed the  patients History and Physical, chart, labs and discussed the procedure including the risks, benefits and alternatives for the proposed anesthesia with the patient or authorized representative who has indicated his/her understanding and acceptance.   Patient has DNR.   Dental advisory given  Plan Discussed with: CRNA  Anesthesia Plan Comments: (See PAT note 01/09/2022  HPI: Daniel Sharp is a 88 y.o. 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 on 04/11/2022 with microscopic hematuria after presenting from home complaining of dark-colored stool.   Following history is provided by the patient as well as the patient's son who is present at bedside, in addition to my discussions with the EDP and via chart review.  For the last few months, the patient has been experiencing recurrent dark-colored stool for which she has been evaluated by [facility] and is scheduled for EGD/colonoscopy with Dr. Tarri Glenn As an outpatient on April 18, 2022.  He also has a history of chronic iron deficiency anemia associated baseline hemoglobin 8-10, and has been requiring recurrent blood transfusions as an outpatient.  Specifically, he has required transfusion of 3 units PRBC over the last 9 days, with most recent transfusion occurring on 04-08-22.  In the setting of a history of paroxysmal atrial fibrillation he is chronically anticoagulated on warfarin, noting no additional blood thinners as an outpatient, including no concomitant aspirin use.  In this context, the patient reports that he  experienced another episode of dark-colored stool yesterday, in the absence of any bright red blood per rectum.  He is on daily oral iron supplementation as an outpatient.  Denies any associated chest pain, shortness of breath, palpitations, diaphoresis, nausea, vomiting, dizziness, presyncope, or syncope.  No hemoptysis.  No recent trauma.  Patient's medical history is also notable for kidney cancer status post left nephrectomy in July 2023 with Dr. Lovena Neighbours Of urology at [facility].  Yesterday, the patient noted new onset gross hematuria, stating that he noted some bright red blood in his diaper, which he believed was urinary in source.  He reports associated 1 to 2 days of suprapubic discomfort in the absence of dysuria or change in urinary urgency/frequency.  Denies any recent subjective )        Anesthesia Quick Evaluation

## 2022-05-24 NOTE — Progress Notes (Signed)
[facility] at bedside to transport patient to [facility].  Debarah Crape, RN 05/29/2022 3:46 PM

## 2022-05-24 NOTE — Transfer of Care (Signed)
Immediate Anesthesia Transfer of Care Note  Patient: Daniel Sharp  Procedure(s) Performed: TRANSESOPHAGEAL ECHOCARDIOGRAM (TEE)  Patient Location: Endoscopy Unit  Anesthesia Type:MAC  Level of Consciousness: drowsy  Airway & Oxygen Therapy: Patient Spontanous Breathing and Patient connected to nasal cannula oxygen  Post-op Assessment: Report given to RN and Post -op Vital signs reviewed and stable  Post vital signs: Reviewed and stable  Last Vitals:  Vitals Value Taken Time  BP  104/54  Temp    Pulse  67  Resp  14  SpO2  99%    Last Pain:  Vitals:   05/23/2022 1240  TempSrc: Temporal  PainSc: 0-No pain      Patients Stated Pain Goal: 0 (58/59/29 2446)  Complications: No notable events documented.

## 2022-05-24 NOTE — Evaluation (Signed)
Occupational Therapy Evaluation Patient Details Name: Daniel Sharp MRN: [id number] DOB: 04/25/1933 Today's Date: 05/17/2022   History of Present Illness 88 y.o. male with medical history significant of hypertension, paroxysmal atrial fibrillation, chronic hypercapnic respiratory failure, BiPAP use at night, history of mitral valve repair, myasthenia gravis, renal cell carcinoma, for L renal cell carcinoma s/p L nephrectomy 01/22/22,  and many other comorbidities. Pt with recent hospitalization 10/5-10/24/23 for hematuria and SAH/meningioma.  Pt was brought into the emergency department from nursing home due to worsening shortness of breath and altered mental status. Patient  tested positive for COVID-pneumonia   Clinical Impression   Pt last ambulated in early October. He has had multiple set backs and most recently was needing lift equipment for OOB at [facility] and dependent in bathing, dressing and toileting. Pt presents with lethargy, but became more alert seated at EOB. He needs +2 max to total assist for bed mobility. He tolerated supported sitting at EOB x 10 minutes. Pt with minimal verbalization, son offering PLOF. Pt is currently dependent in all ADLs. Recommending SNF when pt discharges. Will follow acutely.      Recommendations for follow up therapy are one component of a multi-disciplinary discharge planning process, led by the attending physician.  Recommendations may be updated based on patient status, additional functional criteria and insurance authorization.   Follow Up Recommendations  Skilled nursing-short term rehab (<3 hours/day)     Assistance Recommended at Discharge Frequent or constant Supervision/Assistance  Patient can return home with the following Two people to help with walking and/or transfers;A lot of help with bathing/dressing/bathroom;Assistance with cooking/housework;Assistance with feeding;Direct supervision/assist for medications management;Direct  supervision/assist for financial management;Assist for transportation;Help with stairs or ramp for entrance    Functional Status Assessment  Patient has had a recent decline in their functional status and/or demonstrates limited ability to make significant improvements in function in a reasonable and predictable amount of time  Equipment Recommendations  Other (comment) (defer to next venue)    Recommendations for Other Services       Precautions / Restrictions Precautions Precautions: Fall Precaution Comments: monitor O2 Restrictions Weight Bearing Restrictions: No      Mobility Bed Mobility Overal bed mobility: Needs Assistance Bed Mobility: Rolling, Supine to Sit, Sit to Supine Rolling: Max assist, +2 for physical assistance   Supine to sit: Total assist, +2 for physical assistance Sit to supine: Total assist, +2 for physical assistance   General bed mobility comments: assist for raising trunk, pivoting hips and advancing BLEs to EOB. Pt sat EOB with min/mod A for posterior and L lateral lean for 10 minutes.    Transfers                          Balance Overall balance assessment: Needs assistance Sitting-balance support: Bilateral upper extremity supported, Feet supported Sitting balance-Leahy Scale: Poor Sitting balance - Comments: min/mod assist for posterior and L lateral lean, sat EOB 10', flexed posture, VCs to lift head                                   ADL either performed or assessed with clinical judgement   ADL  General ADL Comments: Pt with significant weakness, low endurance, requires total assist.     Vision Baseline Vision/History: 1 Wears glasses Patient Visual Report: No change from baseline       Perception     Praxis      Pertinent Vitals/Pain Pain Assessment Pain Assessment: Faces Faces Pain Scale: Hurts little more Pain Location: perineal area in  supine Pain Descriptors / Indicators: Grimacing Pain Intervention(s): Monitored during session, Repositioned     Hand Dominance Right   Extremity/Trunk Assessment Upper Extremity Assessment Upper Extremity Assessment: Generalized weakness (minimal functional use, edematous L upper arms)   Lower Extremity Assessment Lower Extremity Assessment: Defer to PT evaluation RLE Deficits / Details: ankles 1/5, hips/knees +2/5, PROM WFL, edema BLEs LLE Deficits / Details: ankles 1/5, hips/knees +2/5, PROM WFL, edema BLEs   Cervical / Trunk Assessment Cervical / Trunk Assessment: Kyphotic;Other exceptions (weakness)   Communication Communication Communication: Expressive difficulties (minimal verbalizations, low volume)   Cognition Arousal/Alertness: Lethargic (alert when seated upright) Behavior During Therapy: Flat affect Overall Cognitive Status: Impaired/Different from baseline Area of Impairment: Following commands, Problem solving, Attention, Awareness                   Current Attention Level: Sustained Memory: Decreased short-term memory Following Commands: Follows one step commands with increased time   Awareness: Emergent Problem Solving: Slow processing, Decreased initiation, Requires verbal cues, Requires tactile cues General Comments: son providing medical hx and PLOF     General Comments       Exercises     Shoulder Instructions      Home Living Family/patient expects to be discharged to:: Skilled nursing facility                                        Prior Functioning/Environment Prior Level of Function : Needs assist             Mobility Comments: hasn't walked since 04/10/22, was at [facility] SNF prior to this admission, lift for transfer ADLs Comments: self fed, needing assist for bathing, dressing and toileting        OT Problem List: Decreased strength;Impaired balance (sitting and/or standing);Decreased  coordination;Decreased cognition;Decreased knowledge of use of DME or AE;Impaired UE functional use;Decreased activity tolerance;Pain      OT Treatment/Interventions: Self-care/ADL training;Therapeutic exercise;Therapeutic activities;Cognitive remediation/compensation;Patient/family education;Balance training;DME and/or AE instruction    OT Goals(Current goals can be found in the care plan section) Acute Rehab OT Goals OT Goal Formulation: With family Time For Goal Achievement: 06/07/22 Potential to Achieve Goals: Fair ADL Goals Pt Will Perform Eating: with min assist;sitting;bed level Pt Will Perform Grooming: with min assist;sitting;bed level Pt/caregiver will Perform Home Exercise Program: Increased strength;Both right and left upper extremity;With minimal assist (A/AROM) Additional ADL Goal #1: Pt will perform bed mobility with max assist in preparation for ADLs. Additional ADL Goal #2: Pt will demonstrate fair sitting balance x 5 minutes at EOB.  OT Frequency: Min 2X/week    Co-evaluation   Reason for Co-Treatment: For patient/therapist safety PT goals addressed during session: Mobility/safety with mobility;Balance;Strengthening/ROM OT goals addressed during session: ADL's and self-care      AM-PAC OT "6 Clicks" Daily Activity     Outcome Measure Help from another person eating meals?: Total Help from another person taking care of personal grooming?: Total Help from another person toileting, which includes using toliet, bedpan, or urinal?: Total  Help from another person bathing (including washing, rinsing, drying)?: Total Help from another person to put on and taking off regular upper body clothing?: Total Help from another person to put on and taking off regular lower body clothing?: Total 6 Click Score: 6   End of Session Nurse Communication: Mobility status  Activity Tolerance: Patient limited by fatigue Patient left: in bed;with call bell/phone within reach;with  family/visitor present;with bed alarm set  OT Visit Diagnosis: Muscle weakness (generalized) (M62.81);Other symptoms and signs involving cognitive function;Pain                Time: 5208-0223 OT Time Calculation (min): 34 min Charges:  OT General Charges $OT Visit: 1 Visit OT Evaluation $OT Eval Moderate Complexity: [place], OTR/L Acute Rehabilitation Services Office: [phone]   Malka So 06/02/2022, 1:15 PM

## 2022-05-24 NOTE — Progress Notes (Signed)
    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   Physical Exam: Constitutional:  Vitals:   05/16/2022 1417 06/03/2022 1427  BP: (!) 115/58 117/69  Pulse: (!) 104 83  Resp: (!) 25 (!) 26  Temp:    SpO2: 96% 100%   He continues on IV antibiotics for the recurrent Enterococcus, repeat cultures clearing.   Findings from the TEE noted and will follow and continue with current antibiotics.  Dr. Candiss Norse on for the weekend.

## 2022-05-24 NOTE — Interval H&P Note (Signed)
History and Physical Interval Note:  05/09/2022 11:29 AM  [name]  has presented today for surgery, with the diagnosis of BACTOREMIA.  The various methods of treatment have been discussed with the patient and family. After consideration of risks, benefits and other options for treatment, the patient has consented to  Procedure(s): TRANSESOPHAGEAL ECHOCARDIOGRAM (TEE) (N/A) as a surgical intervention.  The patient's history has been reviewed, patient examined, no change in status, stable for surgery.  I have reviewed the patient's chart and labs.  Questions were answered to the patient's satisfaction.     Daniel Sharp

## 2022-05-24 NOTE — Anesthesia Postprocedure Evaluation (Signed)
Anesthesia Post Note  Patient: Daniel Sharp  Procedure(s) Performed: TRANSESOPHAGEAL ECHOCARDIOGRAM (TEE)     Patient location during evaluation: PACU Anesthesia Type: MAC Level of consciousness: awake and alert Pain management: pain level controlled Vital Signs Assessment: post-procedure vital signs reviewed and stable Respiratory status: spontaneous breathing Cardiovascular status: stable Anesthetic complications: no   No notable events documented.  Last Vitals:  Vitals:   05/25/2022 1357 05/23/2022 1407  BP: 111/65 124/66  Pulse: 77 84  Resp: (!) 26 (!) 22  Temp:    SpO2: 94% 100%    Last Pain:  Vitals:   05/25/2022 1327  TempSrc: Axillary  PainSc:                  Nolon Nations

## 2022-05-24 NOTE — Anesthesia Procedure Notes (Signed)
Procedure Name: MAC Date/Time: 06/01/2022 1:10 PM  Performed by: Eligha Bridegroom, CRNAPre-anesthesia Checklist: Patient identified, Emergency Drugs available, Suction available, Timeout performed and Patient being monitored Patient Re-evaluated:Patient Re-evaluated prior to induction Oxygen Delivery Method: Simple face mask Preoxygenation: Pre-oxygenation with 100% oxygen Induction Type: IV induction

## 2022-05-24 NOTE — Op Note (Signed)
INDICATIONS: bacteremia  PROCEDURE:   Informed consent was obtained prior to the procedure. The risks, benefits and alternatives for the procedure were discussed and the patient comprehended these risks.  Risks include, but are not limited to, cough, sore throat, vomiting, nausea, somnolence, esophageal and stomach trauma or perforation, bleeding, low blood pressure, aspiration, pneumonia, infection, trauma to the teeth and death.    After a procedural time-out, the oropharynx was anesthetized with 20% benzocaine spray.   During this procedure the patient was administered IV propofol by Anesthesiology, Dr. Lissa Hoard.  The transesophageal probe was inserted in the esophagus and stomach without difficulty and multiple views were obtained.  The patient was kept under observation until the patient left the procedure room.  The patient left the procedure room in stable condition.   Agitated microbubble saline contrast was not administered.  COMPLICATIONS:    There were no immediate complications.  FINDINGS:  Normal LV function. There is a previous mitral annuloplasty repair with an incomplete ring. There is flail motion of the medial (P3) scallop of the posterior mitral leaflet due to ruptured chordae. There is severe eccentric MR, directed laterally. Mild aortic valve sclerosis, mild AI. Moderate TR. Mild PI. Moderate aortic atherosclerosis. No pericardial effusion. No clear vegetations are seen (hyperechoic mobile strands attached to the mitral valve are most likely ruptured chordae). However, mitral insufficiency has worsened substantially when compared to previous reports. The MR jet is very eccentric and was probably underestimated on the recent studies from October 10 and November 11. There is clear and marked worsening of MR since the 02/14/2022 study.   Time Spent Directly with the Patient:  30 minutes   Jagjit Riner 06/03/2022, 1:28 PM

## 2022-05-24 NOTE — Progress Notes (Addendum)
Progress Note   Patient: Daniel Sharp XFG:[id number] DOB: 1932/09/18 DOA: 05/23/2022     9 DOS: the patient was seen and examined on 05/23/2022   Brief hospital course: 88 y.o. male with medical history significant of hypertension, paroxysmal atrial fibrillation, chronic hypercapnic respiratory failure, BiPAP use at night, history of mitral valve repair, myasthenia gravis and many other comorbidities was brought into the emergency department from nursing home due to worsening shortness of breath and altered mental status.  Reportedly patient was tested positive for COVID-pneumonia 2 days ago on Monday.  He was not started on any medications Pt arrived needing bipap initially.  Assessment and Plan: Acute on chronic respiratory failure with hypoxia and hypercapnia secondary to COVID-pneumonia as well as possible superimposed bacterial pneumonia/acute on chronic diastolic congestive heart failure: Patient does not meet sepsis criteria, sepsis ruled out.  Patient seems to have combination of COVID-pneumonia, bacterial pneumonia and acute on chronic diastolic congestive heart failure due to elevated BNP.  Patient completed 5 days of remdesivir.  Patient on Rocephin and ampicillin.  Continue current antibiotics.  Was given IV lasix, now on hold given rising Cr -Currently on 3LNC -BLE markedly edematous despite lasix. Given trial of albumin -continued on TEDs   Acute metabolic encephalopathy:  -recent EKG reviewed, unremarkable QTc. Continue on trial of seroquel for recent insomnia   Recurrent E faecalis bacteremia: Patient was admitted in October when he was diagnosed with rectal adenocarcinoma and double bacteremia shortly after colonoscopy.  He completed 14 days of antibiotics.  His blood cultures growing Enterococcus fecaliths again.  ID has been consulted.  Rocephin was continued and ampicillin added on 05/17/2022.   -Underwent TEE 11/17, reviewed. Concerns for interval significantly worsened MR. No  vegetations seen -cont current abx per ID   BPH: Continue Flomax.   History of paroxysmal atrial fibrillation: Patient not on any rate control medications or any anticoagulation.    Hyperkalemia: Resolved.   CKD stage IIIa -recheck cmp in AM   An episode of nosebleed/blood at the penile meatus:  -recent transient episode of bleeding. Now resolved. Lovenox was d/c'd -On SCD's for dvt prophylaxis   Hypernatremia: Very mild and improving.   Anemia of chronic disease:  Hemodynamically stable Has been receiving iron supplementation PTA Last iron level of 23 on 11/1. Repeat iron of 36 with iron sat of 25   Acute thrombocytopenia: Likely secondary to acute illness.  Lovenox discontinued per above -Plts stable -follow CBC trends      Subjective: Difficult to assess given mentation  Physical Exam: Vitals:   05/25/2022 1530 05/18/2022 1545 05/25/2022 1628 05/19/2022 1700  BP: 104/71 117/65 (!) 122/57 (!) 116/47  Pulse: 92 81 73 81  Resp: (!) '28 20 18 19  '$ Temp:   97.6 F (36.4 C)   TempSrc:   Axillary   SpO2: 100% 97% 100% 100%  Weight:      Height:       General exam: Arousable, laying in bed, in nad Respiratory system: Normal respiratory effort, no wheezing Cardiovascular system: regular rate, s1, s2 Gastrointestinal system: Soft, nondistended, positive BS Central nervous system: CN2-12 grossly intact, strength intact Extremities: Perfused, no clubbing Skin: Normal skin turgor, no notable skin lesions seen Psychiatry: Unable to assess given mentation  Data Reviewed:  There are no new results to review at this time.  Family Communication: Pt in room, family over phone  Disposition: Status is: Inpatient Remains inpatient appropriate because: Severity of illness  Planned Discharge Destination: Skilled nursing facility  Author: Marylu Lund, MD 05/08/2022 5:29 PM  For on call review [email or web address].

## 2022-05-24 NOTE — Evaluation (Signed)
Physical Therapy Evaluation Patient Details Name: Daniel Sharp MRN: [id number] DOB: Jun 30, 1933 Today's Date: 05/23/2022  History of Present Illness  88 y.o. male with medical history significant of hypertension, paroxysmal atrial fibrillation, chronic hypercapnic respiratory failure, BiPAP use at night, history of mitral valve repair, myasthenia gravis, renal cell carcinoma, for L renal cell carcinoma s/p L nephrectomy 01/22/22,  and many other comorbidities. Pt with recent hospitalization 10/5-10/24/23 for hematuria and SAH/meningioma.  Pt was brought into the emergency department from nursing home due to worsening shortness of breath and altered mental status. Patient  tested positive for COVID-pneumonia  Clinical Impression  Pt admitted with above diagnosis. +2 total assist for supine to sit, min/mod A for sitting balance. Pt sat edge of bed x 10 minutes and performed LE strengthening exercises with assistance. Pt is somewhat lethargic with some periods of alertness, but very minimal verbalization. Can follow 1 step commands with verbal/manual cues and max encouragement. Pt is profoundly weak and deconditioned.  Pt currently with functional limitations due to the deficits listed below (see PT Problem List). Pt will benefit from skilled PT to increase their independence and safety with mobility to allow discharge to the venue listed below.          Recommendations for follow up therapy are one component of a multi-disciplinary discharge planning process, led by the attending physician.  Recommendations may be updated based on patient status, additional functional criteria and insurance authorization.  Follow Up Recommendations Skilled nursing-short term rehab (<3 hours/day) Can patient physically be transported by private vehicle: No    Assistance Recommended at Discharge Frequent or constant Supervision/Assistance  Patient can return home with the following  Help with stairs or ramp for  entrance;Assistance with cooking/housework;Assist for transportation;Two people to help with walking and/or transfers;Two people to help with bathing/dressing/bathroom    Equipment Recommendations None recommended by PT  Recommendations for Other Services       Functional Status Assessment Patient has had a recent decline in their functional status and demonstrates the ability to make significant improvements in function in a reasonable and predictable amount of time.     Precautions / Restrictions Precautions Precautions: Fall Precaution Comments: monitor O2 Restrictions Weight Bearing Restrictions: No      Mobility  Bed Mobility Overal bed mobility: Needs Assistance Bed Mobility: Rolling Rolling: Max assist, +2 for physical assistance   Supine to sit: Total assist, +2 for physical assistance Sit to supine: Total assist, +2 for physical assistance   General bed mobility comments: assist for raising trunk, pivoting hips and advancing BLEs to EOB. Pt sat EOB with min/mod A for posterior and L lateral lean for 10 minutes.    Transfers                   General transfer comment: NT 2* poor sitting balance and generalized weakness, pt fatigued after sitting 10' EOB    Ambulation/Gait                  Stairs            Wheelchair Mobility    Modified Rankin (Stroke Patients Only)       Balance Overall balance assessment: Needs assistance Sitting-balance support: Bilateral upper extremity supported, Feet supported Sitting balance-Leahy Scale: Poor Sitting balance - Comments: min/mod assist for posterior and L lateral lean, sat EOB 10', flexed posture, VCs to lift head Postural control: Posterior lean, Left lateral lean  Pertinent Vitals/Pain Pain Assessment Faces Pain Scale: Hurts little more Breathing: normal Negative Vocalization: none Facial Expression: sad, frightened, frown Body Language:  relaxed Consolability: distracted or reassured by voice/touch PAINAD Score: 2 Pain Location: perineal area in supine Pain Descriptors / Indicators: Grimacing Pain Intervention(s): Repositioned, Limited activity within patient's tolerance, Monitored during session    Home Living Family/patient expects to be discharged to:: Skilled nursing facility                        Prior Function Prior Level of Function : Needs assist             Mobility Comments: hasn't walked since 04/10/22, was at [facility] prior to this admission, lift for transfer ADLs Comments: assist needed     Hand Dominance   Dominant Hand: Right    Extremity/Trunk Assessment   Upper Extremity Assessment Upper Extremity Assessment: Defer to OT evaluation    Lower Extremity Assessment Lower Extremity Assessment: LLE deficits/detail;RLE deficits/detail RLE Deficits / Details: ankles 1/5, hips/knees +2/5, PROM WFL, edema BLEs LLE Deficits / Details: ankles 1/5, hips/knees +2/5, PROM WFL, edema BLEs    Cervical / Trunk Assessment Cervical / Trunk Assessment: Kyphotic  Communication   Communication: Expressive difficulties (very minimal verbalization, low volume)  Cognition Arousal/Alertness: Lethargic Behavior During Therapy: Flat affect Overall Cognitive Status: Impaired/Different from baseline Area of Impairment: Following commands, Problem solving, Attention, Awareness                 Orientation Level: Time, Situation   Memory: Decreased short-term memory Following Commands: Follows one step commands with increased time     Problem Solving: Slow processing, Decreased initiation, Requires verbal cues, Requires tactile cues General Comments: slow to respond to questions, very minimal verbalization, pt somewhat lethargic but did have periods with eyes open and attentiveness, son present and provided PLOF        General Comments      Exercises General Exercises - Lower  Extremity Ankle Circles/Pumps: PROM, Both, 15 reps, Supine Long Arc Quad: AAROM, Both, 10 reps, Seated Heel Slides: AAROM, Both, 10 reps, Supine Hip ABduction/ADduction: AAROM, Both, 10 reps, Supine   Assessment/Plan    PT Assessment Patient needs continued PT services  PT Problem List Decreased strength;Decreased activity tolerance;Decreased balance;Decreased mobility;Decreased knowledge of use of DME       PT Treatment Interventions Functional mobility training;Therapeutic activities;Therapeutic exercise;Balance training;Patient/family education    PT Goals (Current goals can be found in the Care Plan section)  Acute Rehab PT Goals Patient Stated Goal: to get stronger PT Goal Formulation: With family Time For Goal Achievement: 06/07/22 Potential to Achieve Goals: Fair    Frequency Min 2X/week     Co-evaluation PT/OT/SLP Co-Evaluation/Treatment: Yes Reason for Co-Treatment: Complexity of the patient's impairments (multi-system involvement);For patient/therapist safety;To address functional/ADL transfers PT goals addressed during session: Mobility/safety with mobility;Balance;Strengthening/ROM         AM-PAC PT "6 Clicks" Mobility  Outcome Measure Help needed turning from your back to your side while in a flat bed without using bedrails?: Total Help needed moving from lying on your back to sitting on the side of a flat bed without using bedrails?: Total Help needed moving to and from a bed to a chair (including a wheelchair)?: Total Help needed standing up from a chair using your arms (e.g., wheelchair or bedside chair)?: Total Help needed to walk in hospital room?: Total Help needed climbing 3-5 steps with a railing? : Total  6 Click Score: 6    End of Session   Activity Tolerance: Patient limited by fatigue;Patient limited by lethargy Patient left: in bed;with bed alarm set;with family/visitor present Nurse Communication: Mobility status;Need for lift equipment PT  Visit Diagnosis: Muscle weakness (generalized) (M62.81);Difficulty in walking, not elsewhere classified (R26.2)    Time: 3832-9191 PT Time Calculation (min) (ACUTE ONLY): 38 min   Charges:   PT Evaluation $PT Eval Moderate Complexity: 1 Mod PT Treatments $Therapeutic Activity: 8-22 mins       Blondell Reveal Kistler PT 06/04/2022  Acute Rehabilitation Services  Office [phone]

## 2022-05-25 DIAGNOSIS — Z7189 Other specified counseling: Secondary | ICD-10-CM | POA: Diagnosis not present

## 2022-05-25 DIAGNOSIS — R531 Weakness: Secondary | ICD-10-CM

## 2022-05-25 DIAGNOSIS — Z515 Encounter for palliative care: Secondary | ICD-10-CM

## 2022-05-25 DIAGNOSIS — A498 Other bacterial infections of unspecified site: Secondary | ICD-10-CM | POA: Diagnosis not present

## 2022-05-25 DIAGNOSIS — J189 Pneumonia, unspecified organism: Secondary | ICD-10-CM | POA: Diagnosis not present

## 2022-05-25 DIAGNOSIS — U071 COVID-19: Secondary | ICD-10-CM | POA: Diagnosis not present

## 2022-05-25 LAB — COMPREHENSIVE METABOLIC PANEL
ALT: 14 U/L (ref 0–44)
AST: 14 U/L — ABNORMAL LOW (ref 15–41)
Albumin: 2.6 g/dL — ABNORMAL LOW (ref 3.5–5.0)
Alkaline Phosphatase: 66 U/L (ref 38–126)
Anion gap: 4 — ABNORMAL LOW (ref 5–15)
BUN: 52 mg/dL — ABNORMAL HIGH (ref 8–23)
CO2: 37 mmol/L — ABNORMAL HIGH (ref 22–32)
Calcium: 10 mg/dL (ref 8.9–10.3)
Chloride: 103 mmol/L (ref 98–111)
Creatinine, Ser: 1.66 mg/dL — ABNORMAL HIGH (ref 0.61–1.24)
GFR, Estimated: 40 mL/min — ABNORMAL LOW (ref >60)
Glucose, Bld: 91 mg/dL (ref 70–99)
Potassium: 4.4 mmol/L (ref 3.5–5.1)
Sodium: 144 mmol/L (ref 135–145)
Total Bilirubin: 0.7 mg/dL (ref 0.3–1.2)
Total Protein: 5.5 g/dL — ABNORMAL LOW (ref 6.5–8.1)

## 2022-05-25 LAB — HEMOGLOBIN AND HEMATOCRIT, BLOOD
HCT: 26.2 % — ABNORMAL LOW (ref 39.0–52.0)
HCT: 24.4 % — ABNORMAL LOW (ref 39.0–52.0)
Hemoglobin: 7.4 g/dL — ABNORMAL LOW (ref 13.0–17.0)
Hemoglobin: 7.1 g/dL — ABNORMAL LOW (ref 13.0–17.0)

## 2022-05-25 LAB — CBC
HCT: 23.9 % — ABNORMAL LOW (ref 39.0–52.0)
Hemoglobin: 6.7 g/dL — CL (ref 13.0–17.0)
MCH: 29.1 pg (ref 26.0–34.0)
MCHC: 28 g/dL — ABNORMAL LOW (ref 30.0–36.0)
MCV: 103.9 fL — ABNORMAL HIGH (ref 80.0–100.0)
Platelets: 124 10*3/uL — ABNORMAL LOW (ref 150–400)
RBC: 2.3 MIL/uL — ABNORMAL LOW (ref 4.22–5.81)
RDW: 16.6 % — ABNORMAL HIGH (ref 11.5–15.5)
WBC: 5.3 10*3/uL (ref 4.0–10.5)
nRBC: 0 % (ref 0.0–0.2)

## 2022-05-25 LAB — PREPARE RBC (CROSSMATCH)

## 2022-05-25 LAB — MAGNESIUM: Magnesium: 2.3 mg/dL (ref 1.7–2.4)

## 2022-05-25 MED ORDER — SODIUM CHLORIDE 0.9% IV SOLUTION: Freq: Once | INTRAVENOUS | Status: DC

## 2022-05-25 NOTE — Progress Notes (Signed)
Progress Note   Patient: Daniel Sharp WUJ:[id number] DOB: 10-20-1932 DOA: 05/23/2022     10 DOS: the patient was seen and examined on 05/25/2022   Brief hospital course: 88 y.o. male with medical history significant of hypertension, paroxysmal atrial fibrillation, chronic hypercapnic respiratory failure, BiPAP use at night, history of mitral valve repair, myasthenia gravis and many other comorbidities was brought into the emergency department from nursing home due to worsening shortness of breath and altered mental status.  Reportedly patient was tested positive for COVID-pneumonia 2 days ago on Monday.  He was not started on any medications Pt arrived needing bipap initially.  Assessment and Plan: Acute on chronic respiratory failure with hypoxia and hypercapnia secondary to COVID-pneumonia as well as possible superimposed bacterial pneumonia/acute on chronic diastolic congestive heart failure: Patient does not meet sepsis criteria, sepsis ruled out.  Patient seems to have combination of COVID-pneumonia, bacterial pneumonia and acute on chronic diastolic congestive heart failure due to elevated BNP.  Patient completed 5 days of remdesivir.  Patient on Rocephin and ampicillin.  Continue current antibiotics.  Was given IV lasix, now on hold given rising Cr -Seen on bipap this afternoon -continued on TEDs as tolerated -Recent CXR reviewed. Findings suggestive of worsened pulm edema. Given dose of lasix overnight   Acute metabolic encephalopathy:  -recent EKG reviewed, unremarkable QTc. Continue on trial of seroquel for recent insomnia   Recurrent E faecalis bacteremia: Patient was admitted in October when he was diagnosed with rectal adenocarcinoma and double bacteremia shortly after colonoscopy.  He completed 14 days of antibiotics.  His blood cultures growing Enterococcus fecaliths again.  ID has been consulted.  Rocephin was continued and ampicillin added on 05/17/2022.   -Underwent TEE 11/17,  reviewed. Concerns for interval significantly worsened MR. No vegetations seen -cont current abx per ID   BPH: Continue Flomax.   History of paroxysmal atrial fibrillation: Patient not on any rate control medications or any anticoagulation.    Hyperkalemia: Resolved.   CKD stage IIIa -recheck cmp in AM   An episode of nosebleed/blood at the penile meatus:  -recent transient episode of bleeding. Now resolved. Lovenox was d/c'd -On SCD's for dvt prophylaxis   Hypernatremia: Normalized   Anemia of chronic disease:  Hemodynamically stable Has been receiving iron supplementation PTA Last iron level of 23 on 11/1. Repeat iron of 36 with iron sat of 25 Hgb currently 7.4   Acute thrombocytopenia: Likely secondary to acute illness.  Lovenox discontinued per above -Plts stable -follow CBC trends      Subjective: Unable to assess given mentation  Physical Exam: Vitals:   05/25/22 1100 05/25/22 1200 05/25/22 1300 05/25/22 1335  BP: (!) 124/45 (!) 109/48 (!) 96/41   Pulse: (!) 128 72 (!) 46   Resp: '15 19 15   '$ Temp:    (!) 97.5 F (36.4 C)  TempSrc:    Oral  SpO2:      Weight:      Height:       General exam: asleep, arousable, in no acute distress Respiratory system: normal chest rise, clear, no audible wheezing Cardiovascular system: regular rhythm, s1-s2 Gastrointestinal system: Nondistended, nontender, pos BS Central nervous system: No seizures, no tremors Extremities: No cyanosis, no joint deformities Skin: No rashes, no pallor Psychiatry: Difficult to assess given mentation   Data Reviewed:  There are no new results to review at this time.  Family Communication: Pt in room, family over phone  Disposition: Status is: Inpatient Remains inpatient appropriate because:  Severity of illness  Planned Discharge Destination: Skilled nursing facility    Author: Marylu Lund, MD 05/25/2022 3:18 PM  For on call review [email or web address].

## 2022-05-25 NOTE — Consult Note (Signed)
Consultation Note Date: 05/25/2022   Patient Name: Daniel Sharp  DOB: Nov 24, 1932  MRN: [id number]  Age / Sex: 88 y.o., male  PCP: [name], MD Referring Physician: Donne Hazel, MD  Reason for Consultation: Establishing goals of care  HPI/Patient Profile: 88 y.o. male   admitted on 05/13/2022   Clinical Assessment and Goals of Care: 60-year-old gentleman with hypertension atrial fibrillation chronic hypercapnic respiratory failure uses BiPAP at night, history of mitral valve repair in the past, history of myasthenia gravis and several other comorbidities, recently diagnosed with cancer, admitted from rehab facility with COVID-pneumonia.  Remains admitted to hospital medicine service for acute on chronic hypoxic and hypercarbic respiratory failure secondary to COVID-pneumonia, also has superimposed bacterial pneumonia, acute on chronic diastolic congestive heart failure, patient underwent TEE and is found to have a mitral valve with severe eccentric mitral regurgitation and ruptured chordae with no evidence of vegetation.  Has Enterococcus in his bloodstream and is on IV antibiotics with infectious disease specialist following. Remains admitted to hospital medicine service and a palliative consultation for ongoing goals of care discussions has been requested. Patient is awake but not entirely alert, he is sitting up in bed, his son Daniel Sharp is present at the bedside and is trying to feed the patient breakfast.  Patient has taken about 10-20% of his tray.  I introduced myself and palliative care as follows: Palliative medicine is specialized medical care for people living with serious illness. It focuses on providing relief from the symptoms and stress of a serious illness. The goal is to improve quality of life for both the patient and the family. Goals of care: Broad aims of medical therapy in relation to  the patient's values and preferences. Our aim is to provide medical care aimed at enabling patients to achieve the goals that matter most to them, given the circumstances of their particular medical situation and their constraints.   Discussed with the patient's son who is the primary historian about the patient's current condition as well as how the past several months have gone.  Patient had a palliative consultation recently at rehab facility.  Outlined the differences between hospice and palliative.  Goals of care discussed.  NEXT OF KIN Son Daniel Sharp who is currently present at the bedside.  SUMMARY OF RECOMMENDATIONS   DNR/DNI Continue current mode of care as a time-limited trial of current interventions over the course of the next 48-72 hours as per goals of care discussions with patient's son Jager Koska who was present at the bedside and is the patient's primary caregiver.  He understands the serious nature of the patient's illness and that the patient has a high risk for ongoing decline and decompensation in spite of current interventions. They, at the time of initial consultation, briefly discussed with the patient's son about consideration of a mode of care that would focus on comfort measures and aggressive symptom management if the patient does not have significant stabilization/recovery after the end of this time trial.  To that  end, also gave him some information about hospice services.  PMT to follow.  Discussed with TRH MD.  Thank you for the consult. Code Status/Advance Care Planning: DNR   Symptom Management:     Palliative Prophylaxis:  Delirium Protocol  Psycho-social/Spiritual:  Desire for further Chaplaincy support:yes Additional Recommendations: Caregiving  Support/Resources  Prognosis:  Guarded   Discharge Planning: To Be Determined      Primary Diagnoses: Present on Admission:  COVID-19 virus infection  Acute metabolic encephalopathy  Rectal cancer  ([place])   I have reviewed the medical record, interviewed the patient and family, and examined the patient. The following aspects are pertinent.  Past Medical History:  Diagnosis Date   A-fib [facility])    history of   Arthritis    Dyspnea    H/O mitral valve repair 07/2003   Hypertension    Ocular myasthenia gravis ([place])    takes pred   Pneumonia    Social History   Socioeconomic History   Marital status: Widowed    Spouse name: Not on file   Number of children: 2   Years of education: Not on file   Highest education level: Not on file  Occupational History   Not on file  Tobacco Use   Smoking status: Former    Types: Cigarettes, Pipe, Cigars   Smokeless tobacco: Former    Types: Chew   Tobacco comments:    Uses nicotine gum   Vaping Use   Vaping Use: Never used  Substance and Sexual Activity   Alcohol use: Yes    Alcohol/week: 7.0 standard drinks of alcohol    Types: 7 Glasses of wine per week    Comment: Drinks wine when sons will let him   Drug use: Never   Sexual activity: Not on file  Other Topics Concern   Not on file  Social History Narrative   Lives in [place], [place].  Son lives in [place]      Right [place]    Lives in a one story home with a basement.    L[facility] at [facility] at this time   Social Determinants of Health   Financial Resource Strain: Not on file  Food Insecurity: No Food Insecurity (05/23/2022)   Hunger Vital Sign    Worried About Running Out of Food in the Last Year: Never true    Ran Out of Food in the Last Year: Never true  Transportation Needs: No Transportation Needs (06/05/2022)   PRAPARE - Hydrologist (Medical): No    Lack of Transportation (Non-Medical): No  Physical Activity: Not on file  Stress: Not on file  Social Connections: Not on file   Family History  Problem Relation Age of Onset   Other Mother        Gallbladder removed   Stroke Father        intracranial stroke   Stroke Brother     Colon cancer Neg Hx    Rectal cancer Neg Hx    Scheduled Meds:  sodium chloride   Intravenous Once   Chlorhexidine Gluconate Cloth  6 each Topical Daily   docusate sodium  100 mg Oral BID   famotidine  20 mg Oral Daily   feeding supplement  237 mL Oral BID BM   ferrous sulfate  325 mg Oral Q breakfast   finasteride  5 mg Oral Daily   multivitamin with minerals  1 tablet Oral Daily   mouth rinse  15 mL  Mouth Rinse 4 times per day   pantoprazole  40 mg Oral BID   predniSONE  10 mg Oral QAC breakfast   pyridostigmine  60 mg Oral Q8H   QUEtiapine  25 mg Oral QHS   tamsulosin  0.4 mg Oral Daily   Continuous Infusions:  sodium chloride 10 mL/hr at 05/25/22 1000   ampicillin (OMNIPEN) IV Stopped (05/25/22 1207)   cefTRIAXone (ROCEPHIN)  IV Stopped (05/25/22 0845)   PRN Meds:.sodium chloride, acetaminophen, ipratropium-albuterol, ondansetron **OR** ondansetron (ZOFRAN) IV, mouth rinse Medications Prior to Admission:  Prior to Admission medications   Medication Sig Start Date End Date Taking? Authorizing Provider  carboxymethylcellulose (REFRESH PLUS) 0.5 % SOLN 1 drop daily as needed (dry eyes).   Yes [provider]  Cholecalciferol (VITAMIN D3 PO) Take 1 tablet by mouth daily.   Yes [provider]  docusate sodium (COLACE) 100 MG capsule Take 1 capsule (100 mg total) by mouth 2 (two) times daily. Patient taking differently: Take 100 mg by mouth daily. 01/22/22  Yes Dancy, Estill Bamberg, PA-C  feeding supplement (ENSURE ENLIVE / ENSURE PLUS) LIQD Take 237 mLs by mouth daily. 05/01/22  Yes Hosie Poisson, MD  ferrous sulfate 325 (65 FE) MG tablet Take 325 mg by mouth daily with breakfast.   Yes [provider]  finasteride (PROSCAR) 5 MG tablet Take 1 tablet (5 mg total) by mouth daily. 05/01/22  Yes Hosie Poisson, MD  furosemide (LASIX) 20 MG tablet Take 20 mg by mouth daily.   Yes [provider]  glucosamine-chondroitin 500-400 MG tablet Take 1 tablet by  mouth daily.   Yes [provider]  linezolid (ZYVOX) 600 MG tablet Take 600 mg by mouth 2 (two) times daily. 2 times daily for 10 days   Yes [provider]  pantoprazole (PROTONIX) 40 MG tablet Take 1 tablet (40 mg total) by mouth 2 (two) times daily. 04/30/22  Yes Hosie Poisson, MD  predniSONE (DELTASONE) 5 MG tablet Take 2 tablets (10 mg total) by mouth daily. Patient taking differently: Take 10 mg by mouth daily. Taking 2 tabs by mouth daily 02/11/22  Yes Patel, Donika K, DO  pyridostigmine (MESTINON) 60 MG tablet Take 1 tablet (60 mg total) by mouth every 8 (eight) hours. 02/19/22  Yes Shelly Coss, MD  tamsulosin (FLOMAX) 0.4 MG CAPS capsule Take 0.4 mg by mouth daily.   Yes [provider]   Allergies  Allergen Reactions   Aminoglycosides     Hx of MG    Botox [Onabotulinumtoxina]     Hx of MG    Corticosteroids     Caution with high doses as Hx of MG    Hydroxychloroquine     Hx of MG    Levaquin [Levofloxacin]     Hx of MG  - avoid all Fluoroquinolones   Macrolides And Ketolides     Hx of MG    Magnesium-Containing Compounds     Use with caution as pt with Hx of MG    Neuromuscular Blocking Agents     Hx of MG    Penicillamine     Hx of MG    Procainamide     Hx of MG    Quinidine     Hx of MG    Review of Systems Opens eyes and attempts to respond some, however not able to communicate freely Physical Exam Overall, patient appears lethargic Resting in bed Monitor noted Regular work of breathing S1-S2  Vital Signs: BP Marland Kitchen)  96/41   Pulse (!) 46   Temp (!) 97.5 F (36.4 C) (Oral)   Resp 15   Ht '6\' 1"'$  (1.854 m)   Wt 92.3 kg   SpO2 96%   BMI 26.85 kg/m  Pain Scale: 0-10   Pain Score: 0-No pain   SpO2: SpO2: 96 % O2 Device:SpO2: 96 % O2 Flow Rate: .O2 Flow Rate (L/min): 3 L/min  IO: Intake/output summary:  Intake/Output Summary (Last 24 hours) at 05/25/2022 1429 Last data filed at 05/25/2022 1000 Gross per 24 hour  Intake  1109.13 ml  Output 700 ml  Net 409.13 ml    LBM: Last BM Date : 05/23/22 Baseline Weight: Weight: 88.9 kg Most recent weight: Weight: 92.3 kg     Palliative Assessment/Data:   PPS 40%  Time In:  1400 Time Out:  1500 Time Total:  60  Greater than 50%  of this time was spent counseling and coordinating care related to the above assessment and plan.  Signed by: Loistine Chance, MD   Please contact Palliative Medicine Team phone at [phone] for questions and concerns.  For individual provider: See Shea Evans

## 2022-05-25 NOTE — Progress Notes (Addendum)
       CROSS COVER NOTE  NAME: Daniel Sharp MRN: [id number] DOB : 1932-08-14    Date of Service   05/25/2022   HPI/Events of Note   Follow-up: Report given with concerns for patient's lethargic state.  Chest x-ray and labs (ammonia, ABG) were ordered by day MD.  Bedside RN reports patient remains lethargic, but responsive to pain.  No acute changes or signs of distress were reported.  ABG interpreted as respiratory acidosis with hypoxemia and hypercapnia, pH 7.33, PCO2 77, PO2 69, HCO3 40.6.  Patient has been placed on BiPAP.  Ammonia level of 42, not significantly elevated to cause current lethargic state.  Consider repeating this level if level of consciousness does not improve.  Chest x-ray reviewed --> worsening bilateral pleural effusion and pulmonary edema. Pt home med is 40 mg lasix, however patient's Lasix has been on hold due to concerns for rising creatinine level.  Current creatinine 1.56.  Patient will be given small dose of IV Lasix due to soft SBP 100-110's. Pt remains afebrile. He wears 3L [place] O2 and has diminished breath sounds.   0000- RN reports 700 cc u/o and that patient has suddenly woke up and was able to remove BiPAP by himself. Oriented to self only. RN has been asked to place patient back on BiPAP when he is ready to sleep. Based of improvement in his level of consciousness, lethargic state could have been caused by hypercapnic state.   0600- RN reports pt was able to tolerate a total of 6 hours on BiPAP. Mentation improving.    Interventions/ Plan   IV lasix- if BP tolerates it, consider continuing diuresis  Bipap       Raenette Rover, DNP, [place]

## 2022-05-26 ENCOUNTER — Encounter (HOSPITAL_COMMUNITY): Payer: Self-pay | Admitting: Cardiovascular Disease

## 2022-05-26 DIAGNOSIS — U071 COVID-19: Secondary | ICD-10-CM | POA: Diagnosis not present

## 2022-05-26 DIAGNOSIS — J189 Pneumonia, unspecified organism: Secondary | ICD-10-CM | POA: Diagnosis not present

## 2022-05-26 DIAGNOSIS — A498 Other bacterial infections of unspecified site: Secondary | ICD-10-CM | POA: Diagnosis not present

## 2022-05-26 LAB — COMPREHENSIVE METABOLIC PANEL
ALT: 11 U/L (ref 0–44)
AST: 13 U/L — ABNORMAL LOW (ref 15–41)
Albumin: 2.6 g/dL — ABNORMAL LOW (ref 3.5–5.0)
Alkaline Phosphatase: 60 U/L (ref 38–126)
Anion gap: 6 (ref 5–15)
BUN: 50 mg/dL — ABNORMAL HIGH (ref 8–23)
CO2: 33 mmol/L — ABNORMAL HIGH (ref 22–32)
Calcium: 10 mg/dL (ref 8.9–10.3)
Chloride: 105 mmol/L (ref 98–111)
Creatinine, Ser: 1.66 mg/dL — ABNORMAL HIGH (ref 0.61–1.24)
GFR, Estimated: 40 mL/min — ABNORMAL LOW (ref >60)
Glucose, Bld: 85 mg/dL (ref 70–99)
Potassium: 3.7 mmol/L (ref 3.5–5.1)
Sodium: 144 mmol/L (ref 135–145)
Total Bilirubin: 0.8 mg/dL (ref 0.3–1.2)
Total Protein: 5.6 g/dL — ABNORMAL LOW (ref 6.5–8.1)

## 2022-05-26 LAB — CBC
HCT: 25 % — ABNORMAL LOW (ref 39.0–52.0)
Hemoglobin: 7.1 g/dL — ABNORMAL LOW (ref 13.0–17.0)
MCH: 29.3 pg (ref 26.0–34.0)
MCHC: 28.4 g/dL — ABNORMAL LOW (ref 30.0–36.0)
MCV: 103.3 fL — ABNORMAL HIGH (ref 80.0–100.0)
Platelets: 138 10*3/uL — ABNORMAL LOW (ref 150–400)
RBC: 2.42 MIL/uL — ABNORMAL LOW (ref 4.22–5.81)
RDW: 16.7 % — ABNORMAL HIGH (ref 11.5–15.5)
WBC: 6.6 10*3/uL (ref 4.0–10.5)
nRBC: 0 % (ref 0.0–0.2)

## 2022-05-26 MED ORDER — ALBUMIN HUMAN 25 % IV SOLN
25.0000 g | Freq: Once | INTRAVENOUS | Status: AC
  Administered 2022-05-26: 25 g via INTRAVENOUS
  Filled 2022-05-26: qty 100

## 2022-05-26 MED ORDER — LOPERAMIDE HCL 2 MG PO CAPS
2.0000 mg | ORAL_CAPSULE | ORAL | Status: DC | PRN
  Administered 2022-05-26: 2 mg via ORAL
  Filled 2022-05-26 (×2): qty 1

## 2022-05-26 MED ORDER — AMIODARONE HCL 200 MG PO TABS
200.0000 mg | ORAL_TABLET | Freq: Every day | ORAL | Status: DC
  Administered 2022-05-26 – 2022-05-29 (×4): 200 mg via ORAL
  Filled 2022-05-26 (×4): qty 1

## 2022-05-26 NOTE — Progress Notes (Addendum)
Progress Note   Patient: Daniel Sharp YQI:[id number] DOB: Jan 11, 1933 DOA: 06/03/2022     11 DOS: the patient was seen and examined on 05/26/2022   Brief hospital course: 88 y.o. male with medical history significant of hypertension, paroxysmal atrial fibrillation, chronic hypercapnic respiratory failure, BiPAP use at night, history of mitral valve repair, myasthenia gravis and many other comorbidities was brought into the emergency department from nursing home due to worsening shortness of breath and altered mental status.  Reportedly patient was tested positive for COVID-pneumonia 2 days ago on Monday.  He was not started on any medications Pt arrived needing bipap initially.  Assessment and Plan: Acute on chronic respiratory failure with hypoxia and hypercapnia secondary to COVID-pneumonia as well as possible superimposed bacterial pneumonia/acute on chronic diastolic congestive heart failure: Patient does not meet sepsis criteria, sepsis ruled out.  Patient seems to have combination of COVID-pneumonia, bacterial pneumonia and acute on chronic diastolic congestive heart failure due to elevated BNP.  Patient completed 5 days of remdesivir.  Patient on Rocephin and ampicillin.  Continue current antibiotics.  Was given IV lasix, now on hold given rising Cr and poor po intake -Remains on bipap this afternoon -continued on TEDs as tolerated -Recent CXR reviewed. Findings suggestive of worsened pulm edema. Recently given dose of lasix    Acute metabolic encephalopathy:  -recent EKG reviewed, unremarkable QTc. Continue on trial of seroquel for recent insomnia   Recurrent E faecalis bacteremia: Patient was admitted in October when he was diagnosed with rectal adenocarcinoma and double bacteremia shortly after colonoscopy.  He completed 14 days of antibiotics.  His blood cultures growing Enterococcus fecaliths again.  ID has been consulted.  Rocephin was continued and ampicillin added on 05/17/2022.    -Underwent TEE 11/17, reviewed. Concerns for interval significantly worsened MR. No vegetations seen -cont current abx per ID  Severe MR -Marked worsening incidentally noted on recent TEE -Discussed with family. Pt's son states family had long discussion with Dr. Sallyanne Kuster regarding TEE results and that they understand pt is not surgical candidate   BPH: Continue Flomax.   History of paroxysmal atrial fibrillation: Patient not on any rate control medications or any anticoagulation.    Hyperkalemia: Resolved.   CKD stage IIIa -recheck cmp in AM   An episode of nosebleed/blood at the penile meatus:  -recent transient episode of bleeding. Now resolved. Lovenox was d/c'd -On SCD's for dvt prophylaxis   Hypernatremia: Normalized   Anemia of chronic disease:  Hemodynamically stable Has been receiving iron supplementation PTA Last iron level of 23 on 11/1. Repeat iron of 36 with iron sat of 25 Hgb currently 7.1   Acute thrombocytopenia: Likely secondary to acute illness.  Lovenox discontinued per above -Plts stable -follow CBC trends   Recently diagnosed Rectal adenocarcinoma -diagnosed on 04/19/22 colon -at this point, likely not candidate for surgery given increased O2 requirements, renal failure, and now with severe MR that is not operable, see below  Severe MR -Per above, acute worsening noted incidentally on TEE -Discussed with on-call CT surgeon, Dr. Kipp Brood. Given hx of prior mitral valve surgery, advanced age, hemodynamic instability, and medical frailty, patient is not a candidate for surgical procedure -Appreciate assistance by Palliative Care    Subjective: Cannot assess given mentation  Physical Exam: Vitals:   05/26/22 1121 05/26/22 1200 05/26/22 1300 05/26/22 1400  BP:  (!) 102/55 (!) 99/54 (!) 102/52  Pulse: 95 82 90 91  Resp: (!) '21 18 19 16  '$ Temp:  97.6 F (  36.4 C)    TempSrc:  Axillary    SpO2: 96% 96% 97% 96%  Weight:      Height:       General  exam: Arousable in no acute distress Respiratory system: normal chest rise, clear, no audible wheezing Cardiovascular system: regular rhythm, s1-s2 Gastrointestinal system: Nondistended, nontender, pos BS Central nervous system: No seizures, no tremors Extremities: No cyanosis, no joint deformities Skin: No rashes, no pallor Psychiatry: Unable to assess given mentation  Data Reviewed:  There are no new results to review at this time.  Family Communication: Pt in room, recently updated family over phone  Disposition: Status is: Inpatient Remains inpatient appropriate because: Severity of illness  Planned Discharge Destination: Skilled nursing facility    Author: Marylu Lund, MD 05/26/2022 3:21 PM  For on call review [email or web address].

## 2022-05-26 NOTE — Progress Notes (Signed)
[facility] for Infectious Disease  Date of Admission:  06/04/2022    Principal Problem:   Enterococcus faecalis infection Active Problems:   H/O mitral valve replacement   Acute metabolic encephalopathy   Rectal cancer (HCC)   COVID-19 virus infection   Acute on chronic respiratory failure with hypoxia and hypercapnia (HCC)   Protein-calorie malnutrition, severe          Assessment: 52 YM admitted with:  #COVID PNA #Recurrent E faecalis bacteremia #Severe MR, ruptured chordae(, Hx  of MV angioplasty #Rectal adenocarcinoma #Hx of renal Cx SP left nephrectomy  July, 2-23 -TEE showed MV with severe eccentric MR and rupture -Son spoke with Dr. Sallyanne Kuster and pt not a surgical candidate -Goals of care ongoing, palliative care engaged Recommendations: -Continue ampicillin and ceftriaxone, -Awaiting CTS eval    Microbiology:   Antibiotics: Amp and CTX Cultures: Blood 11/11 NG    SUBJECTIVE: [place]. No new complains Interval: Afebrile overnight. Wbc 6k  Review of Systems: Review of Systems  All other systems reviewed and are negative.    Scheduled Meds:  sodium chloride   Intravenous Once   amiodarone  200 mg Oral Daily   Chlorhexidine Gluconate Cloth  6 each Topical Daily   docusate sodium  100 mg Oral BID   famotidine  20 mg Oral Daily   feeding supplement  237 mL Oral BID BM   ferrous sulfate  325 mg Oral Q breakfast   finasteride  5 mg Oral Daily   multivitamin with minerals  1 tablet Oral Daily   mouth rinse  15 mL Mouth Rinse 4 times per day   pantoprazole  40 mg Oral BID   predniSONE  10 mg Oral QAC breakfast   pyridostigmine  60 mg Oral Q8H   QUEtiapine  25 mg Oral QHS   tamsulosin  0.4 mg Oral Daily   Continuous Infusions:  sodium chloride 10 mL/hr at 05/26/22 1500   albumin human     ampicillin (OMNIPEN) IV Stopped (05/26/22 1245)   cefTRIAXone (ROCEPHIN)  IV Stopped (05/26/22 0942)   PRN Meds:.sodium chloride, acetaminophen,  ipratropium-albuterol, loperamide, ondansetron **OR** ondansetron (ZOFRAN) IV, mouth rinse Allergies  Allergen Reactions   Aminoglycosides     Hx of MG    Botox [Onabotulinumtoxina]     Hx of MG    Corticosteroids     Caution with high doses as Hx of MG    Hydroxychloroquine     Hx of MG    Levaquin [Levofloxacin]     Hx of MG  - avoid all Fluoroquinolones   Macrolides And Ketolides     Hx of MG    Magnesium-Containing Compounds     Use with caution as pt with Hx of MG    Neuromuscular Blocking Agents     Hx of MG    Penicillamine     Hx of MG    Procainamide     Hx of MG    Quinidine     Hx of MG     OBJECTIVE: Vitals:   05/26/22 1400 05/26/22 1500 05/26/22 1600 05/26/22 1658  BP: (!) 102/52 133/64 (!) 133/105   Pulse: 91 97 (!) 114   Resp: 16 20 (!) 25   Temp:    97.6 F (36.4 C)  TempSrc:    Axillary  SpO2: 96% 100% 100%   Weight:      Height:       Body mass index  is 26.58 kg/m.  Physical Exam Constitutional:      General: He is not in acute distress.    Appearance: He is normal weight. He is not toxic-appearing.  HENT:     Head: Normocephalic and atraumatic.     Right Ear: External ear normal.     Left Ear: External ear normal.     Nose: No congestion or rhinorrhea.     Mouth/Throat:     Mouth: Mucous membranes are moist.     Pharynx: Oropharynx is clear.  Eyes:     Extraocular Movements: Extraocular movements intact.     Conjunctiva/sclera: Conjunctivae normal.     Pupils: Pupils are equal, round, and reactive to light.  Cardiovascular:     Rate and Rhythm: Normal rate and regular rhythm.     Heart sounds: No murmur heard.    No friction rub. No gallop.  Pulmonary:     Effort: Pulmonary effort is normal.     Breath sounds: Normal breath sounds.  Abdominal:     General: Abdomen is flat. Bowel sounds are normal.     Palpations: Abdomen is soft.  Musculoskeletal:        General: No swelling. Normal range of motion.     Cervical back: Normal  range of motion and neck supple.  Skin:    General: Skin is warm and dry.  Neurological:     General: No focal deficit present.     Mental Status: He is oriented to person, place, and time.  Psychiatric:        Mood and Affect: Mood normal.       Lab Results Lab Results  Component Value Date   WBC 6.6 05/26/2022   HGB 7.1 (L) 05/26/2022   HCT 25.0 (L) 05/26/2022   MCV 103.3 (H) 05/26/2022   PLT 138 (L) 05/26/2022    Lab Results  Component Value Date   CREATININE 1.66 (H) 05/26/2022   BUN 50 (H) 05/26/2022   NA 144 05/26/2022   K 3.7 05/26/2022   CL 105 05/26/2022   CO2 33 (H) 05/26/2022    Lab Results  Component Value Date   ALT 11 05/26/2022   AST 13 (L) 05/26/2022   ALKPHOS 60 05/26/2022   BILITOT 0.8 05/26/2022        Laurice Record, [facility] 05/26/2022, 5:07 PM

## 2022-05-27 DIAGNOSIS — U071 COVID-19: Secondary | ICD-10-CM | POA: Diagnosis not present

## 2022-05-27 DIAGNOSIS — R531 Weakness: Secondary | ICD-10-CM | POA: Diagnosis not present

## 2022-05-27 DIAGNOSIS — Z7189 Other specified counseling: Secondary | ICD-10-CM | POA: Diagnosis not present

## 2022-05-27 DIAGNOSIS — J189 Pneumonia, unspecified organism: Secondary | ICD-10-CM | POA: Diagnosis not present

## 2022-05-27 DIAGNOSIS — Z515 Encounter for palliative care: Secondary | ICD-10-CM | POA: Diagnosis not present

## 2022-05-27 DIAGNOSIS — A498 Other bacterial infections of unspecified site: Secondary | ICD-10-CM | POA: Diagnosis not present

## 2022-05-27 LAB — CBC
HCT: 23.1 % — ABNORMAL LOW (ref 39.0–52.0)
Hemoglobin: 6.7 g/dL — CL (ref 13.0–17.0)
MCH: 29.8 pg (ref 26.0–34.0)
MCHC: 29 g/dL — ABNORMAL LOW (ref 30.0–36.0)
MCV: 102.7 fL — ABNORMAL HIGH (ref 80.0–100.0)
Platelets: 133 10*3/uL — ABNORMAL LOW (ref 150–400)
RBC: 2.25 MIL/uL — ABNORMAL LOW (ref 4.22–5.81)
RDW: 17.1 % — ABNORMAL HIGH (ref 11.5–15.5)
WBC: 6.8 10*3/uL (ref 4.0–10.5)
nRBC: 0 % (ref 0.0–0.2)

## 2022-05-27 LAB — COMPREHENSIVE METABOLIC PANEL
ALT: 10 U/L (ref 0–44)
AST: 13 U/L — ABNORMAL LOW (ref 15–41)
Albumin: 2.8 g/dL — ABNORMAL LOW (ref 3.5–5.0)
Alkaline Phosphatase: 56 U/L (ref 38–126)
Anion gap: 8 (ref 5–15)
BUN: 46 mg/dL — ABNORMAL HIGH (ref 8–23)
CO2: 33 mmol/L — ABNORMAL HIGH (ref 22–32)
Calcium: 10 mg/dL (ref 8.9–10.3)
Chloride: 106 mmol/L (ref 98–111)
Creatinine, Ser: 1.45 mg/dL — ABNORMAL HIGH (ref 0.61–1.24)
GFR, Estimated: 47 mL/min — ABNORMAL LOW (ref >60)
Glucose, Bld: 92 mg/dL (ref 70–99)
Potassium: 3.6 mmol/L (ref 3.5–5.1)
Sodium: 147 mmol/L — ABNORMAL HIGH (ref 135–145)
Total Bilirubin: 0.6 mg/dL (ref 0.3–1.2)
Total Protein: 5.6 g/dL — ABNORMAL LOW (ref 6.5–8.1)

## 2022-05-27 LAB — PREPARE RBC (CROSSMATCH)

## 2022-05-27 LAB — HEMOGLOBIN AND HEMATOCRIT, BLOOD
HCT: 27.7 % — ABNORMAL LOW (ref 39.0–52.0)
Hemoglobin: 8 g/dL — ABNORMAL LOW (ref 13.0–17.0)

## 2022-05-27 MED ORDER — SODIUM CHLORIDE 0.9% IV SOLUTION: Freq: Once | INTRAVENOUS | Status: DC

## 2022-05-27 NOTE — Progress Notes (Signed)
Chaplain stopped by Daniel Sharp's room, but he was resting.  Chaplain connected with Daniel Sharp by phone to introduce spiritual care support services and to assess for emotional needs of Daniel Sharp and his family.  No needs stated at this time.  [address], [place] Pager, [phone]

## 2022-05-27 NOTE — Progress Notes (Signed)
Progress Note   Patient: Daniel Sharp KDT:[id number] DOB: [birth year] DOA: 06/03/2022     12 DOS: the patient was seen and examined on 05/27/2022   Brief hospital course: 88 y.o. male with medical history significant of hypertension, paroxysmal atrial fibrillation, chronic hypercapnic respiratory failure, BiPAP use at night, history of mitral valve repair, myasthenia gravis and many other comorbidities was brought into the emergency department from nursing home due to worsening shortness of breath and altered mental status.  Reportedly patient was tested positive for COVID-pneumonia 2 days ago on Monday.  He was not started on any medications Pt arrived needing bipap initially.  Assessment and Plan: Acute on chronic respiratory failure with hypoxia and hypercapnia secondary to COVID-pneumonia as well as possible superimposed bacterial pneumonia/acute on chronic diastolic congestive heart failure: Patient does not meet sepsis criteria, sepsis ruled out.  Patient seems to have combination of COVID-pneumonia, bacterial pneumonia and acute on chronic diastolic congestive heart failure due to elevated BNP.  Patient completed 5 days of remdesivir.  Patient on Rocephin and ampicillin.  Continue current antibiotics.  Was given IV lasix, now on hold given rising Cr and poor po intake -Remains on bipap this afternoon -continued on TEDs as tolerated -Recent CXR reviewed. Findings suggestive of worsened pulm edema, improved somewhat with lasix. Currently remains on bipap   Acute metabolic encephalopathy:  -recent EKG reviewed, unremarkable QTc. Family reports improvement with trial of seroquel for recent insomnia   Recurrent E faecalis bacteremia: Patient was admitted in October when he was diagnosed with rectal adenocarcinoma and double bacteremia shortly after colonoscopy.  He completed 14 days of antibiotics.  His blood cultures growing Enterococcus fecaliths again.  ID has been consulted.  Rocephin was  continued and ampicillin added on 05/17/2022.   -Underwent TEE 11/17, reviewed. Concerns for interval significantly worsened MR. No vegetations seen -cont current abx per ID  Severe MR -Marked worsening incidentally noted on recent TEE -Discussed with family. Pt's son states family had long discussion with Dr. Sallyanne Kuster regarding TEE results and that they understand pt is not surgical candidate -On 11/19, had discussed case with on-call CT surgeon, Dr. Kipp Brood. Per CT Surgery, given hx of prior mitral valve surgery, advanced age, hemodynamic instability, and medical frailty, patient is not a candidate for surgical procedure -Appreciate assistance by Palliative Care   BPH: Continue Flomax.   History of paroxysmal atrial fibrillation: Patient not on any rate control medications or any anticoagulation.    Hyperkalemia: Resolved.   CKD stage IIIa -repeat bmet in AM   An episode of nosebleed/blood at the penile meatus:  -recent transient episode of bleeding. Now resolved. Lovenox was d/c'd -On SCD's for dvt prophylaxis   Hypernatremia: Normalized   Anemia of chronic disease:  Hemodynamically stable Has been receiving iron supplementation PTA Last iron level of 23 on 11/1. Repeat iron of 36 with iron sat of 25 Hgb currently 7.1   Acute thrombocytopenia: Likely secondary to acute illness.  Lovenox discontinued per above -Plts stable -follow CBC trends   Recently diagnosed Rectal adenocarcinoma -diagnosed on 04/19/22 colon -at this point, likely not candidate for surgery given increased O2 requirements, renal failure, and now with severe MR that is not operable, see above     Subjective: Difficult for pt to verbalize while on bipap, however pt nods yes when asked if he's doing well  Physical Exam: Vitals:   05/27/22 1400 05/27/22 1507 05/27/22 1600 05/27/22 1708  BP: (!) 121/48  (!) 122/50   Pulse: 79 73  76 72  Resp: '16 17 18 '$ (!) 28  Temp:   97.9 F (36.6 C)   TempSrc:    Oral   SpO2: 100% 100% 100% 100%  Weight:      Height:       General exam: arousable, in no acute distress Respiratory system: normal chest rise, clear, no audible wheezing Cardiovascular system: regular rhythm, s1-s2 Gastrointestinal system: Nondistended, nontender, pos BS Central nervous system: No seizures, no tremors Extremities: No cyanosis, no joint deformities Skin: No rashes, no pallor Psychiatry: difficult to assess given mentation  Data Reviewed:  There are no new results to review at this time.  Family Communication: Pt in room, recently updated family over phone  Disposition: Status is: Inpatient Remains inpatient appropriate because: Severity of illness  Planned Discharge Destination: Skilled nursing facility    Author: Marylu Lund, MD 05/27/2022 7:03 PM  For on call review [email or web address].

## 2022-05-27 NOTE — Progress Notes (Signed)
Daily Progress Note   Patient Name: Daniel Sharp       Date: 05/27/2022 DOB: Feb 07, 1933  Age: 88 y.o. MRN#: [id number] Attending Physician: Donne Hazel, MD Primary Care Physician: [name], MD Admit Date: 05/26/2022  Reason for Consultation/Follow-up: Establishing goals of care  Subjective: On BIPAP, son not at bedside at time of visit, chart reviewed, requiring PRBC  Length of Stay: 12  Current Medications: Scheduled Meds:   sodium chloride   Intravenous Once   sodium chloride   Intravenous Once   amiodarone  200 mg Oral Daily   Chlorhexidine Gluconate Cloth  6 each Topical Daily   docusate sodium  100 mg Oral BID   famotidine  20 mg Oral Daily   feeding supplement  237 mL Oral BID BM   ferrous sulfate  325 mg Oral Q breakfast   finasteride  5 mg Oral Daily   multivitamin with minerals  1 tablet Oral Daily   mouth rinse  15 mL Mouth Rinse 4 times per day   pantoprazole  40 mg Oral BID   predniSONE  10 mg Oral QAC breakfast   pyridostigmine  60 mg Oral Q8H   QUEtiapine  25 mg Oral QHS   tamsulosin  0.4 mg Oral Daily    Continuous Infusions:  sodium chloride 10 mL/hr at 05/26/22 2038   ampicillin (OMNIPEN) IV 2 g (05/27/22 1306)   cefTRIAXone (ROCEPHIN)  IV Stopped (05/27/22 1108)    PRN Meds: sodium chloride, acetaminophen, ipratropium-albuterol, loperamide, ondansetron **OR** ondansetron (ZOFRAN) IV, mouth rinse  Physical Exam         On BIPAP Confused Monitor noted Verbalizes some Appears with generalized weakness  Vital Signs: BP (!) 122/45   Pulse 92   Temp (!) 97.4 F (36.3 C) (Oral)   Resp (!) 22   Ht '6\' 1"'$  (1.854 m)   Wt 92.8 kg   SpO2 96%   BMI 26.99 kg/m  SpO2: SpO2: 96 % O2 Device: O2 Device: Nasal Cannula O2 Flow Rate: O2 Flow Rate  (L/min): 3 L/min  Intake/output summary:  Intake/Output Summary (Last 24 hours) at 05/27/2022 1335 Last data filed at 05/27/2022 1046 Gross per 24 hour  Intake 1089.91 ml  Output 600 ml  Net 489.91 ml   LBM: Last BM Date : 05/26/22 Baseline Weight: Weight:  88.9 kg Most recent weight: Weight: 92.8 kg       Palliative Assessment/Data:      Patient Active Problem List   Diagnosis Date Noted   Enterococcus faecalis infection 05/17/2022   Protein-calorie malnutrition, severe 05/16/2022   COVID-19 virus infection 05/20/2022   Acute on chronic respiratory failure with hypoxia and hypercapnia (HCC) 05/13/2022   Bacteremia 04/29/2022   Rectal cancer ([place]) 04/23/2022   Tubular adenoma of colon 04/23/2022   Rectal mass 04/20/2022   Acute respiratory failure with hypoxia and hypercapnia (HCC) 37/16/9678   Acute metabolic encephalopathy 93/81/0175   BPH (benign prostatic hyperplasia) 04/17/2022   Acute on chronic diastolic CHF (congestive heart failure) ([place]) 04/17/2022   SAH (subarachnoid hemorrhage) ([place]) 04/17/2022   Meningioma ([place]) 04/17/2022   NSVT (nonsustained ventricular tachycardia) ([place]) 04/17/2022   Chronic kidney disease, stage 3a ([place]) 04/17/2022   Abnormal CT scan, colon    Pressure injury of skin 04/15/2022   Microscopic hematuria 04/12/2022   GIB (gastrointestinal bleeding) 04/12/2022   Sepsis ([place]) 04/12/2022   Hemorrhagic cystitis 04/12/2022   Dehydration 04/12/2022   Acute prerenal azotemia 04/12/2022   Gross hematuria 04/11/2022   IDA (iron deficiency anemia) 04/03/2022   Elevated troponin 02/13/2022   H/O mitral valve repair 02/13/2022   Generalized weakness 02/13/2022   Hypercalcemia 02/13/2022   Hyperbilirubinemia 02/13/2022   Renal cell carcinoma of left kidney ([place]) 01/30/2022   Myasthenia gravis without acute exacerbation ([place]) 01/30/2022   Renal mass 01/22/2022   H/O mitral valve replacement 07/11/2021   Localized edema 07/11/2021   Overweight  with body mass index (BMI) 25.0-29.9 11/23/2018   Persistent atrial fibrillation ([place]) 03/28/2012   Hypertension 03/09/2009   Hypercholesterolemia 03/09/2009    Palliative Care Assessment & Plan   Patient Profile:  88 year old gentleman with hypertension atrial fibrillation chronic hypercapnic respiratory failure uses BiPAP at night, history of mitral valve repair in the past, history of myasthenia gravis and several other comorbidities, recently diagnosed with cancer, admitted from rehab facility with COVID-pneumonia.  Remains admitted to hospital medicine service for acute on chronic hypoxic and hypercarbic respiratory failure secondary to COVID-pneumonia, also has superimposed bacterial pneumonia, acute on chronic diastolic congestive heart failure, patient underwent TEE and is found to have a mitral valve with severe eccentric mitral regurgitation and ruptured chordae with no evidence of vegetation.  Has Enterococcus in his bloodstream and is on IV antibiotics with infectious disease specialist following. Remains admitted to hospital medicine service and a palliative consultation for ongoing goals of care discussions has been requested.  Assessment:  Generalized weakness Functional decline Acute on chronic respiratory failure with hypoxia and hypercapnia secondary to COVID-pneumonia as well as possible superimposed bacterial pneumonia/acute on chronic diastolic congestive heart failure Recurrent E faecalis bacteremia:  Severe MR on TEE  Recommendations/Plan: Continue current mode of care As per discussions with patient's son, he wished to continue current scope of treatments as a time limited trial.  PMT will attempt to re engage [place] discussions with son this week, monitor hospital course and overall disease trajectory of illness for now. Remains at high risk for ongoing decline, son aware of serious nature of patient's overall condition.    Goals of Care and Additional  Recommendations: Limitations on Scope of Treatment: Full Scope Treatment  Code Status:    Code Status Orders  (From admission, onward)           Start     Ordered   05/27/2022 1739  Do not attempt resuscitation (DNR)  Continuous       Question Answer Comment  In the event of cardiac or respiratory ARREST Do not call a "code blue"   In the event of cardiac or respiratory ARREST Do not perform Intubation, CPR, defibrillation or ACLS   In the event of cardiac or respiratory ARREST Use medication by any route, position, wound care, and other measures to relive pain and suffering. May use oxygen, suction and manual treatment of airway obstruction as needed for comfort.      06/03/2022 1741           Code Status History     Date Active Date Inactive Code Status Order ID Comments User Context   06/06/2022 1728 05/14/2022 1741 DNR [id number]  Lanier Clam, MD ED   04/15/2022 1811 05/01/2022 0330 Partial Code [id number]  Brand Males, MD Inpatient   04/11/2022 2325 04/15/2022 1811 Full Code [id number]  Rhetta Mura, DO ED   02/13/2022 1829 02/19/2022 2005 Full Code [id number]  Reubin [place], MD ED   01/22/2022 1322 01/24/2022 0059 Full Code [id number]  Lattie Corns Inpatient      Advance Directive Documentation    Flowsheet Row Most Recent Value  Type of Advance Directive Healthcare Power of Attorney, Living will  Pre-existing out of facility DNR order (yellow form or pink MOST form) --  "MOST" Form in Place? --       Prognosis:  Guarded   Discharge Planning: To Be Determined  Care plan was discussed with  IDT  Thank you for allowing the Palliative Medicine Team to assist in the care of this patient.  Low MDM.  Greater than 50%  of this time was spent counseling and coordinating care related to the above assessment and plan.  Loistine Chance, MD  Please contact Palliative Medicine Team phone at [phone] for questions and concerns.

## 2022-05-27 NOTE — Progress Notes (Signed)
Spoke with patient's son, Ashdon Gillson to obtain verbal consent to transfuse blood. Witnessed by Leone Haven, RN

## 2022-05-28 DIAGNOSIS — I340 Nonrheumatic mitral (valve) insufficiency: Secondary | ICD-10-CM

## 2022-05-28 DIAGNOSIS — E43 Unspecified severe protein-calorie malnutrition: Secondary | ICD-10-CM

## 2022-05-28 DIAGNOSIS — J9622 Acute and chronic respiratory failure with hypercapnia: Secondary | ICD-10-CM

## 2022-05-28 DIAGNOSIS — A498 Other bacterial infections of unspecified site: Secondary | ICD-10-CM | POA: Diagnosis not present

## 2022-05-28 DIAGNOSIS — Z515 Encounter for palliative care: Secondary | ICD-10-CM

## 2022-05-28 DIAGNOSIS — J189 Pneumonia, unspecified organism: Secondary | ICD-10-CM

## 2022-05-28 DIAGNOSIS — J9621 Acute and chronic respiratory failure with hypoxia: Secondary | ICD-10-CM

## 2022-05-28 DIAGNOSIS — Z7189 Other specified counseling: Secondary | ICD-10-CM

## 2022-05-28 DIAGNOSIS — G9341 Metabolic encephalopathy: Secondary | ICD-10-CM

## 2022-05-28 DIAGNOSIS — U071 COVID-19: Secondary | ICD-10-CM | POA: Diagnosis not present

## 2022-05-28 LAB — COMPREHENSIVE METABOLIC PANEL
ALT: 12 U/L (ref 0–44)
AST: 15 U/L (ref 15–41)
Albumin: 2.7 g/dL — ABNORMAL LOW (ref 3.5–5.0)
Alkaline Phosphatase: 59 U/L (ref 38–126)
Anion gap: 7 (ref 5–15)
BUN: 46 mg/dL — ABNORMAL HIGH (ref 8–23)
CO2: 33 mmol/L — ABNORMAL HIGH (ref 22–32)
Calcium: 9.9 mg/dL (ref 8.9–10.3)
Chloride: 103 mmol/L (ref 98–111)
Creatinine, Ser: 1.68 mg/dL — ABNORMAL HIGH (ref 0.61–1.24)
GFR, Estimated: 39 mL/min — ABNORMAL LOW (ref >60)
Glucose, Bld: 99 mg/dL (ref 70–99)
Potassium: 3.9 mmol/L (ref 3.5–5.1)
Sodium: 143 mmol/L (ref 135–145)
Total Bilirubin: 0.7 mg/dL (ref 0.3–1.2)
Total Protein: 5.5 g/dL — ABNORMAL LOW (ref 6.5–8.1)

## 2022-05-28 LAB — CBC
HCT: 26.8 % — ABNORMAL LOW (ref 39.0–52.0)
Hemoglobin: 7.6 g/dL — ABNORMAL LOW (ref 13.0–17.0)
MCH: 29.3 pg (ref 26.0–34.0)
MCHC: 28.4 g/dL — ABNORMAL LOW (ref 30.0–36.0)
MCV: 103.5 fL — ABNORMAL HIGH (ref 80.0–100.0)
Platelets: 139 10*3/uL — ABNORMAL LOW (ref 150–400)
RBC: 2.59 MIL/uL — ABNORMAL LOW (ref 4.22–5.81)
RDW: 17.9 % — ABNORMAL HIGH (ref 11.5–15.5)
WBC: 7.9 10*3/uL (ref 4.0–10.5)
nRBC: 0 % (ref 0.0–0.2)

## 2022-05-28 NOTE — Progress Notes (Signed)
Nutrition Follow-up  DOCUMENTATION CODES:   Severe malnutrition in context of acute illness/injury  INTERVENTION:  -Continue Heart healthy diet as tolerated. Consider liberalizing if po intake is poor -Continue Ensure Plus/ Ensure Enlive BID (350kcal and 16g/20g protein) -Continue MVI, iron - Consider discontinuing or making bowel regimen prn as patient experiencing diarrhea   NUTRITION DIAGNOSIS:   Severe Malnutrition related to acute illness (covid) as evidenced by moderate fat depletion, moderate muscle depletion, edema (decreased intake but unable to provide details). *ongoing  GOAL:   Patient will meet greater than or equal to 90% of their needs *intake varies  MONITOR:   PO intake, Diet advancement, Supplement acceptance  REASON FOR ASSESSMENT:   Consult Enteral/tube feeding initiation and management  ASSESSMENT:   Pt is an 88yo M with PMH of HTN, A fib, chronic blood loss anemia, kidney cancer s/p L nephrectomy, chronic right sided heart failure, BPH, CKD 3, ocular myasthenia gravis, and arthritis who was recently admitted with hematuria and gi bleed. Presents this admission for worsening shortness of breath and altered mental status found to be covid positive.  Patient noted to be confused today. Meal intake varies from 10% to 80%. Thankfully, he is noted to be consuming Ensure daily, usually twice but occasionally only 1 time per day. Noted to be having some diarrhea however still receiving Colace BID. Imodium on board.  Palliative care consulted and plan to continue current care at this time but family discussing plan for patient. Discussions of hospice started   Medications reviewed and include: Colace, Iron, MVI with minerals, prn imodium   Labs reviewed:  Creatinine 1.68   Diet Order:   Diet Order             Diet Heart Room service appropriate? Yes with Assist; Fluid consistency: Thin  Diet effective now                   EDUCATION NEEDS:   Education needs have been addressed  Skin:  Skin Assessment: Skin Integrity Issues: Skin Integrity Issues:: Stage III DTI: coccyx Stage I: heel Stage III: sacrum  Last BM:  11/21 - diarrhea   Height:  Ht Readings from Last 1 Encounters:  05/09/2022 '6\' 1"'$  (1.854 m)   Weight:  Wt Readings from Last 1 Encounters:  05/28/22 91.8 kg    BMI:  Body mass index is 26.7 kg/m.  Estimated Nutritional Needs:  Kcal:  4098-1191YNWG Protein:  100-130g Fluid:  2142m    ASamson FredericRD, LDN For contact information, refer to [facility]

## 2022-05-28 NOTE — Progress Notes (Signed)
Progress Note   Patient: Daniel Sharp ENI:[id number] DOB: Dec 16, 1932 DOA: 05/12/2022     13 DOS: the patient was seen and examined on 05/28/2022   Brief hospital course: 88 y.o. male with medical history significant of hypertension, paroxysmal atrial fibrillation, chronic hypercapnic respiratory failure, BiPAP use at night, history of mitral valve repair, myasthenia gravis and many other comorbidities was brought into the emergency department from nursing home due to worsening shortness of breath and altered mental status.  Reportedly patient was tested positive for COVID-pneumonia 2 days ago on Monday.  He was not started on any medications Pt arrived needing bipap initially.  Assessment and Plan: Acute on chronic respiratory failure with hypoxia and hypercapnia secondary to COVID-pneumonia as well as possible superimposed bacterial pneumonia/acute on chronic diastolic congestive heart failure:  -Presented pos for covid, completed course of remdesivir -currently remains on Rocephin and ampicillin per below. -Had received IV lasix, however now on hold given rising Cr and poor po intake -Continue on bipap as needed   Acute metabolic encephalopathy:  -recent EKG reviewed, unremarkable QTc.  -Family reports improvement with trial of seroquel to maintain day/night cycle   Recurrent E faecalis bacteremia:  -Patient was admitted in October when he was diagnosed with rectal adenocarcinoma and double bacteremia shortly after colonoscopy.   -blood cultures growing Enterococcus fecaliths again.   -ID consulted with recs to cont on Rocephin and ampicillin -Underwent TEE 11/17, reviewed. No vegetations, however there are concerns for interval significantly worsened MR. See below -cont current abx per ID  Severe MR -Marked worsening since 8/23 noted on recent TEE -Pt's son had long discussion with Dr. Sallyanne Kuster regarding TEE results and that they understand pt is not surgical candidate -On 11/19, I had  discussed case and findings with on-call CT surgeon, Dr. Kipp Brood. Per CT Surgery, given hx of prior mitral valve surgery, advanced age, hemodynamic instability, and medical frailty, patient is not a candidate for surgical intervention -Appreciate assistance by Palliative Care -continue on bipap as needed   BPH: Continue Flomax.   History of paroxysmal atrial fibrillation:  -Patient not on any rate control medications or any anticoagulation.    Hyperkalemia: Resolved.   CKD stage IIIa -renal function stable at this time -repeat bmet in AM   An episode of nosebleed/blood at the penile meatus:  -recent transient episode of bleeding. Now resolved. Lovenox was d/c'd -On SCD's for dvt prophylaxis   Hypernatremia:  -Normalized   Anemia of chronic disease:  -Hemodynamically stable -Has been receiving iron supplementation PTA -Last iron level of 23 on 11/1. Repeat iron of 36 with iron sat of 25 -Hgb currently 7.6, had required 1 unit PRBC on 11/20 for hgb 6.7 -recheck cbc in AM   Acute thrombocytopenia:  -Likely secondary to acute illness.  Lovenox discontinued -Plts stable   Recently diagnosed Rectal adenocarcinoma -diagnosed on 04/19/22 colonoscopy -at this point, likely not candidate for surgery given increased O2 requirements, renal failure, and now with severe MR that is not operable, see above -Had been established with Dr. Irene Limbo     Subjective: Unable to assess given mentation, on bipap  Physical Exam: Vitals:   05/28/22 1400 05/28/22 1500 05/28/22 1515 05/28/22 1600  BP: (!) 112/54 (!) 106/53  (!) 131/47  Pulse: 67 62  78  Resp: (!) 21 20  (!) 29  Temp:    (!) 97.5 F (36.4 C)  TempSrc:    Axillary  SpO2: 100% 100% 100% 98%  Weight:  Height:       General exam: asleep, laying in bed, in nad Respiratory system: Normal respiratory effort, no wheezing, on bipap Cardiovascular system: regular rate, s1, s2 Gastrointestinal system: Soft, nondistended, positive  BS Central nervous system: CN2-12 grossly intact, strength intact Extremities: Perfused, no clubbing Skin: Normal skin turgor, no notable skin lesions seen Psychiatry: unable to assess given mentation   Data Reviewed:  Labs reviewed: Na 143, K 3.9, Cr 1.68, Hgb 7.6, WBC 7.9, Plts 139  Family Communication: Pt in room, recently updated family over phone  Disposition: Status is: Inpatient Remains inpatient appropriate because: Severity of illness  Planned Discharge Destination: Skilled nursing facility    Author: Marylu Lund, MD 05/28/2022 5:53 PM  For on call review [email or web address].

## 2022-05-28 NOTE — Progress Notes (Signed)
PT Cancellation Note  Patient Details Name: Daniel Sharp MRN: [id number] DOB: [birth year]   Cancelled Treatment:     Pt present with increased confusion and inability to sustain eye contact.  Palliative meeting later today.  Will update LPT.    Rica Koyanagi  PTA Acute  Rehabilitation Services Office M-F          [phone] Weekend pager [phone]

## 2022-05-28 NOTE — Progress Notes (Signed)
Daily Progress Note   Patient Name: Daniel Sharp       Date: 05/28/2022 DOB: 12/24/1932  Age: 88 y.o. MRN#: [id number] Attending Physician: Donne Hazel, MD Primary Care Physician: [name], MD Admit Date: 06/03/2022 Length of Stay: 13 days  Reason for Consultation/Follow-up: Establishing goals of care  Subjective:   CC: Patient confused and able to mumble simple words at times.  Following up regarding complex medical decision making.  Spoke with patient's son, reported HCPOA, Daniel Sharp at bedside.  Subjective:  Reviewed EMR prior to seeing patient.  Patient last seen by PMT provider Dr. Sherolyn Buba on 11/20.  When reviewing vitals in EMR, respiratory rate remains high.  Patient on 3 L nasal cannula this AM.  Spoke with bedside RN for updates.  Bedside RN noted patient refused wearing BiPAP continuously last night.  Patient remains on precautions for COVID.  Presented to bedside.  [name], Daniel Sharp, present at bedside.  Able to introduce myself as a member of the palliative care team.  When looking at patient, he is able to track with his eyes sometimes though appears overall confused.  Mumbling words such as yes at times.  Patient not able to participate in complex medical decision making on his own.  Son notes patient has remained confused especially over the past few days.  Patient showed increased work of breathing during meeting with use of accessory muscles and RR's into the 3s.  Patient also trying to remove nasal cannula at one point with signs of agitation.  Son remembers speaking with Dr. Rowe Pavy on 11/18.  Son was able to update me about patient's extensive medical history over the past year including broken foot, surgery for kidney mass removal, colon cancer found with bleeding, worsening respiratory status with COVID, and now with diagnosis of severe MR.  Son was able to describe that he spoke with the heart surgeon who told him that this valve is not repairable  and prognosticate at that time was getting shorter. Spent time providing emotional support through active listening.  Son able to describe how active and vibrant patient was up until all the setbacks this past year and how many rallies he has been able to have even with regaining strength this year.  After getting to know about the patient and his history, with son's permission we were able to discuss possible medical pathways moving forward.  Discussed first pathways to continue current medical interventions which remain focused on aggressive life prolonging therapy.  Did discuss that son has been told patient has multiple comorbidities that cannot be fixed so essentially everything we are doing is a Band-Aid to buy more time.  Then able to discuss alternative pathway for care.  Son had mentioned that goal had been for patient to get home with him as they had built an extra room for him to support his care.  We discussed the idea of patient going home with hospice.  In setting of severe MR, worsening respiratory status, decreased GFR, need for transfusions in setting of anemia, debility, and decreased oral intake, do believe it is appropriate for patient to be under hospice care with the philosophy of patient having a prognosis of 6 months or less.  We discussed based on patient's current situation, time is more likely weeks to short months though this can vary day-to-day.  Son acknowledged that he had been thinking this and was concerned about where we go from here.  Provided generalized details about hospice  care including the fact that the philosophy is to focus on the symptom management at the end of life to provide quality time and not to focus on quantity of time so interventions such as IV fluids, transfusions, and lab work would not be normally continued on hospice.  Son willing to talk to hospice liaison regarding specifics for a particular hospice group.  Noted would reach out to [facility] who would allow  him to pick a hospice group that he could get further details from.  Encouraged son to discuss with family to decide what kind of care his father would advocate for if he was capable of doing so himself.  Son did note patient's other son would be coming to visit tomorrow.  Encouraged conversations with family and other providers.  All questions answered at that time.  Thanked son for allowing me to meet with him and the patient today.  Review of Systems  Objective:   Vital Signs:  BP (!) 111/55   Pulse 83   Temp 97.6 F (36.4 C) (Axillary)   Resp (!) 29   Ht '6\' 1"'$  (1.854 m)   Wt 91.8 kg   SpO2 100%   BMI 26.70 kg/m   Physical Exam: General: Ill appearing, confused, mumbling words at times Eyes: No drainage noted HENT: Dry mucous membranes Cardiovascular: RRR, anasarca present in all extremities Respiratory: On 3 L nasal cannula, increased work of breathing noted throughout meeting with a respiratory rate up into the 40s and use of accessory muscles Abdomen: not distended Extremities: Warm to touch Skin: Multiple ecchymoses on upper extremities bilaterally Neuro: Confused, mumbling words at times Psych: Unable to fully due to confusion  Imaging:  I personally reviewed recent imaging.   Assessment & Plan:   Assessment: 88 year old gentleman with hypertension atrial fibrillation chronic hypercapnic respiratory failure uses BiPAP at night, history of mitral valve repair in the past, history of myasthenia gravis and several other comorbidities, recently diagnosed with cancer, admitted from rehab facility with COVID-pneumonia.  Remains admitted to hospital medicine service for acute on chronic hypoxic and hypercarbic respiratory failure secondary to COVID-pneumonia, also has superimposed bacterial pneumonia, acute on chronic diastolic congestive heart failure, patient underwent TEE and is found to have a mitral valve with severe eccentric mitral regurgitation and ruptured chordae  with no evidence of vegetation.  Has Enterococcus in his bloodstream and is on IV antibiotics with infectious disease specialist following. Remains admitted to hospital medicine service and a palliative consultation for ongoing goals of care discussions has been requested.  Recommendations/Plan: # Complex medical decision making/goals of care:  - Unable to fully dissipate in complex medical decision making due to his medical status.  -Spoke with son, Daniel Sharp, extensively at bedside today to discuss possible pathways for medical care moving forward including continuing with aggressive medical care versus transitioning to focus on patient's comfort with hospice support at home.  At this time we will continue current level of medical interventions.  Encourage son to discuss with family and providers what medical care patient would want for himself if he were able to participate in these complex conversations.  Son is willing to speak with hospice liaison for further information so contacted [facility] to assist with providing list of hospice organizations for son to review and then to have liaison reach out to son.   -Based on patient's current medical status as described above in HPI, patient appears appropriate for hospice at this time.  -  Code Status: DNR Prognosis: Weeks  to short months (<6 months prognosis)  # Symptom management:  -As per primary team  # Psychosocial Support:  -Son: Daniel Sharp reported he is HCPOA; patient has another son, Daniel Sharp, who will be coming to visit tomorrow.  # Discharge Planning: TBD  Discussed with: Bedside RN, patient, family  Thank you for allowing the palliative care team to participate in the care Daniel Sharp.  Chelsea Aus, DO Palliative Care Provider PMT # [phone]  This provider spent a total of 62 minutes providing patient's care.  Includes review of EMR, discussing care with other staff members involved in patient's medical care, obtaining relevant  history and information from patient and/or patient's family, and personal review of imaging and lab work. Greater than 50% of the time was spent counseling and coordinating care related to the above assessment and plan.

## 2022-05-29 DIAGNOSIS — A498 Other bacterial infections of unspecified site: Secondary | ICD-10-CM | POA: Diagnosis not present

## 2022-05-29 DIAGNOSIS — U071 COVID-19: Secondary | ICD-10-CM | POA: Diagnosis not present

## 2022-05-29 DIAGNOSIS — G9341 Metabolic encephalopathy: Secondary | ICD-10-CM | POA: Diagnosis not present

## 2022-05-29 DIAGNOSIS — C20 Malignant neoplasm of rectum: Secondary | ICD-10-CM | POA: Diagnosis not present

## 2022-05-29 DIAGNOSIS — R627 Adult failure to thrive: Secondary | ICD-10-CM | POA: Insufficient documentation

## 2022-05-29 LAB — COMPREHENSIVE METABOLIC PANEL WITH GFR
ALT: 12 U/L (ref 0–44)
AST: 14 U/L — ABNORMAL LOW (ref 15–41)
Albumin: 2.7 g/dL — ABNORMAL LOW (ref 3.5–5.0)
Alkaline Phosphatase: 57 U/L (ref 38–126)
Anion gap: 5 (ref 5–15)
BUN: 53 mg/dL — ABNORMAL HIGH (ref 8–23)
CO2: 37 mmol/L — ABNORMAL HIGH (ref 22–32)
Calcium: 10.3 mg/dL (ref 8.9–10.3)
Chloride: 106 mmol/L (ref 98–111)
Creatinine, Ser: 1.82 mg/dL — ABNORMAL HIGH (ref 0.61–1.24)
GFR, Estimated: 36 mL/min — ABNORMAL LOW
Glucose, Bld: 98 mg/dL (ref 70–99)
Potassium: 4.1 mmol/L (ref 3.5–5.1)
Sodium: 148 mmol/L — ABNORMAL HIGH (ref 135–145)
Total Bilirubin: 0.7 mg/dL (ref 0.3–1.2)
Total Protein: 5.9 g/dL — ABNORMAL LOW (ref 6.5–8.1)

## 2022-05-29 LAB — TYPE AND SCREEN
ABO/RH(D): A POS
Antibody Screen: NEGATIVE
Unit division: 0
Unit division: 0

## 2022-05-29 LAB — CBC
HCT: 25.3 % — ABNORMAL LOW (ref 39.0–52.0)
Hemoglobin: 7.2 g/dL — ABNORMAL LOW (ref 13.0–17.0)
MCH: 30.1 pg (ref 26.0–34.0)
MCHC: 28.5 g/dL — ABNORMAL LOW (ref 30.0–36.0)
MCV: 105.9 fL — ABNORMAL HIGH (ref 80.0–100.0)
Platelets: 131 K/uL — ABNORMAL LOW (ref 150–400)
RBC: 2.39 MIL/uL — ABNORMAL LOW (ref 4.22–5.81)
RDW: 17.5 % — ABNORMAL HIGH (ref 11.5–15.5)
WBC: 6.8 K/uL (ref 4.0–10.5)
nRBC: 0 % (ref 0.0–0.2)

## 2022-05-29 LAB — BPAM RBC
Blood Product Expiration Date: 202312132359
Blood Product Expiration Date: 202312142359
ISSUE DATE / TIME: 202311200612
Unit Type and Rh: 6200
Unit Type and Rh: 6200

## 2022-05-29 NOTE — Progress Notes (Addendum)
[facility] Infectious Diseases Follow Up Note  Patient Identification: Patient Name: Daniel Sharp MRN: [id number] [place] Date: 05/14/2022 12:44 PM Age: 88 y.o.Today's Date: 05/29/2022  Reason for Visit: E faecalis bacteremia/endocarditis  Principal Problem:   Enterococcus faecalis infection Active Problems:   H/O mitral valve replacement   Acute metabolic encephalopathy   Rectal cancer ([place])   COVID-19 virus infection   Acute on chronic respiratory failure with hypoxia and hypercapnia (HCC)   Protein-calorie malnutrition, severe   Palliative care encounter   Pneumonia due to infectious organism   Goals of care, counseling/discussion   Counseling and coordination of care   Severe mitral regurgitation   Current antibiotics:  Ampicillin Ceftriaxone  Lines/Hardwares: MV repair   Interval Events: Continues to remain afebrile, no leukocytosis.  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 -Nonsurgical candidate per CT VS -palliative care following for [place] discussion   # Encephalopathy - awake, difficult to assess mental status, per primary some improvement in mental status after seroquel   # Rectal adenocarcinoma - follows Dr Irene Limbo # Hx of renal Cx SP left nephrectomy  July, 2023  # Acute on chronic respiratory failure secondary to COVID-pneumonia as well as concerns for pneumonia and CHF -Completed course of remdesivir -Isolation precautions   Recommendations Continue ampicillin and ceftriaxone as is pending GOC discussion  Can plan for 6 weeks from 11/11 for treatment of E faecalis endocarditis if congruent with goals of care discussion.  However patient has deemed a nonsurgical candidate for severe MVR as well as rectal adenocarcinoma and I do not think prolonged antibiotics by itself would make any  significant difference in his overall health and quality of life. I agree we should focus on making him comfortable.   Will not follow actively as family is discussing regarding goals of care. ID available as needed. Please call with questions   Rest of the management as per the primary team. Thank you for the consult. Please page with pertinent questions or concerns.  ______________________________________________________________________ Subjective patient seen and examined at the bedside.  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 pred   Pneumonia    Past Surgical History:  Procedure Laterality Date   APPENDECTOMY     BACK SURGERY     cyst removed L3 and L4   BIOPSY  04/19/2022   Procedure: BIOPSY;  Surgeon: Irving Copas., MD;  Location: [facility];  Service: Gastroenterology;;   CHOLECYSTECTOMY     COLONOSCOPY WITH PROPOFOL N/A 04/19/2022   Procedure: COLONOSCOPY WITH PROPOFOL;  Surgeon: Irving Copas., MD;  Location: [facility];  Service: Gastroenterology;  Laterality: N/A;   ENDOSCOPIC MUCOSAL RESECTION  04/19/2022   Procedure: ENDOSCOPIC MUCOSAL RESECTION;  Surgeon: Rush Landmark Telford Nab., MD;  Location: [facility];  Service: Gastroenterology;;   ESOPHAGOGASTRODUODENOSCOPY (EGD) WITH PROPOFOL N/A 04/19/2022   Procedure: ESOPHAGOGASTRODUODENOSCOPY (EGD) WITH PROPOFOL;  Surgeon: Irving Copas., MD;  Location: [facility];  Service: Gastroenterology;  Laterality: N/A;   FINGER SURGERY Right    R hand   HEMOSTASIS CLIP PLACEMENT  04/19/2022   Procedure: HEMOSTASIS CLIP PLACEMENT;  Surgeon: Rush Landmark Telford Nab., MD;  Location: [facility];  Service: Gastroenterology;;   MITRAL VALVE REPAIR     on coumadin   NOSE SURGERY     POLYPECTOMY  04/19/2022    Procedure: POLYPECTOMY;  Surgeon: Mansouraty, Telford Nab., MD;  Location: [facility];  Service: Gastroenterology;;   ROBOTIC ASSITED PARTIAL NEPHRECTOMY Left 01/22/2022   Procedure: XI ROBOTIC ASSITED NEPHRECTOMY;  Surgeon: Ceasar Mons, MD;  Location: [facility];  Service: Urology;  Laterality: Left;   SUBMUCOSAL LIFTING INJECTION  04/19/2022   Procedure: SUBMUCOSAL LIFTING INJECTION;  Surgeon: Rush Landmark Telford Nab., MD;  Location: [facility];  Service: Gastroenterology;;   SUBMUCOSAL TATTOO INJECTION  04/19/2022   Procedure: SUBMUCOSAL TATTOO INJECTION;  Surgeon: Irving Copas., MD;  Location: [facility];  Service: Gastroenterology;;   TEE WITHOUT CARDIOVERSION N/A 05/09/2022   Procedure: TRANSESOPHAGEAL ECHOCARDIOGRAM (TEE);  Surgeon: Sanda Klein, MD;  [facility];  Service: Cardiovascular;  Laterality: N/A;    Vitals BP 114/62   Pulse 89   Temp (!) 97.4 F (36.3 C) (Oral)   Resp (!) 28   Ht '6\' 1"'$  (1.854 m)   Wt 91.8 kg   SpO2 100%   BMI 26.70 kg/m     Physical Exam Constitutional: Chronically sick looking elderly male sitting up in the bed in mild to moderate respiratory distress    Comments:   Cardiovascular:     Rate and Rhythm: Normal rate and regular rhythm.     Heart sounds:  Pulmonary:     Effort: Pulmonary effort is increased on nasal cannula and unable to complete full sentences    Comments:   Abdominal:     Palpations: Abdomen is soft.     Tenderness: nondistended  Musculoskeletal:        General: No swelling or tenderness in peripheral joints but multiple bruises in the upper extremities likely secondary to fragile skin and IV creaks. All extremities appear edematous   Skin:    Comments: as above   Neurological:     General: Awake, responds to questions somewhat but difficult to assess his mentation  Pertinent Microbiology Results for orders placed or performed during the hospital encounter of 05/10/2022  Blood  culture (routine x 2)     Status: None   Collection Time: 05/16/2022  2:15 PM   Specimen: BLOOD  Result Value Ref Range Status   Specimen Description   Final    BLOOD BLOOD RIGHT HAND Performed at [facility] [address] [place], [place] [address]    Special Requests   Final    BOTTLES DRAWN AEROBIC AND ANAEROBIC Blood Culture results may not be optimal due to an inadequate volume of blood received in culture bottles Performed at [facility] [address] [place], [place] [address]    Culture   Final    NO GROWTH 5 DAYS Performed at [facility], [place] [address] [place], [place] [address]    Report Status 05/20/2022 FINAL  Final  Blood culture (routine x 2)     Status: Abnormal   Collection Time: 05/23/2022  2:15 PM   Specimen: BLOOD  Result Value Ref Range Status   Specimen Description   Final    BLOOD BLOOD RIGHT ARM Performed at [facility] [address], [place], [place] [address]    Special Requests   Final    BOTTLES DRAWN AEROBIC AND ANAEROBIC Blood Culture results may not be optimal due to an inadequate volume of blood received in culture bottles  Performed at [facility], [place] [address], [place], [place] [address]    Culture  Setup Time   Final    GRAM POSITIVE COCCI IN CHAINS ANAEROBIC BOTTLE ONLY CRITICAL RESULT CALLED TO, READ BACK BY AND VERIFIED WITH: PHARMD CHRISTY [id number] AT 1107 BY EC CRITICAL RESULT CALLED TO, READ BACK BY AND VERIFIED WITH: PHARMD J LEGGE [id number] AT 66 AM B Y CM Performed at [facility], [place] [address], [place], [place] [address]    Culture ENTEROCOCCUS FAECALIS (A)  Final   Report Status 05/19/2022 FINAL  Final   Organism ID, Bacteria ENTEROCOCCUS FAECALIS  Final      Susceptibility   Enterococcus faecalis - MIC*    AMPICILLIN <=2 SENSITIVE Sensitive     VANCOMYCIN 2 SENSITIVE Sensitive     GENTAMICIN SYNERGY SENSITIVE Sensitive     *  ENTEROCOCCUS FAECALIS  Resp Panel by RT-PCR (Flu A&B, Covid) Peripheral     Status: Abnormal   Collection Time: 05/19/2022  2:15 PM   Specimen: Peripheral; Nasal Swab  Result Value Ref Range Status   SARS Coronavirus 2 by RT PCR POSITIVE (A) NEGATIVE Final    Comment: (NOTE) SARS-CoV-2 target nucleic acids are DETECTED.  The SARS-CoV-2 RNA is generally detectable in upper respiratory specimens during the acute phase of infection. Positive results are indicative of the presence of the identified virus, but do not rule out bacterial infection or co-infection with other pathogens not detected by the test. Clinical correlation with patient history and other diagnostic information is necessary to determine patient infection status. The expected result is Negative.  Fact Sheet for Patients: EntrepreneurPulse.com.au  Fact Sheet for Healthcare Providers: IncredibleEmployment.be  This test is not yet approved or cleared by the [place] FDA and  has been authorized for detection and/or diagnosis of SARS-CoV-2 by FDA under an Emergency Use Authorization (EUA).  This EUA will remain in effect (meaning this test can be used) for the duration of  the COVID-19 declaration under Section 564(b)(1) of the A ct, 21 U.S.C. section 360bbb-3(b)(1), unless the authorization is terminated or revoked sooner.     Influenza A by PCR NEGATIVE NEGATIVE Final   Influenza B by PCR NEGATIVE NEGATIVE Final    Comment: (NOTE) The Xpert Xpress SARS-CoV-2/FLU/RSV plus assay is intended as an aid in the diagnosis of influenza from Nasopharyngeal swab specimens and should not be used as a sole basis for treatment. Nasal washings and aspirates are unacceptable for Xpert Xpress SARS-CoV-2/FLU/RSV testing.  Fact Sheet for Patients: EntrepreneurPulse.com.au  Fact Sheet for Healthcare Providers: IncredibleEmployment.be  This test is not yet  approved or cleared by the [place] FDA and has been authorized for detection and/or diagnosis of SARS-CoV-2 by FDA under an Emergency Use Authorization (EUA). This EUA will remain in effect (meaning this test can be used) for the duration of the COVID-19 declaration under Section 564(b)(1) of the Act, 21 U.S.C. section 360bbb-3(b)(1), unless the authorization is terminated or revoked.  Performed at [facility], [place] [address] [place], [place] [address]   Blood Culture ID Panel (Reflexed)     Status: Abnormal   Collection Time: 05/23/2022  2:15 PM  Result Value Ref Range Status   Enterococcus faecalis DETECTED (A) NOT DETECTED Final    Comment: CRITICAL RESULT CALLED TO, READ BACK BY AND VERIFIED WITH: PHARMD J LEGGE [id number] AT 1153 AM  BY CM    Enterococcus Faecium NOT DETECTED NOT DETECTED Final   Listeria monocytogenes  NOT DETECTED NOT DETECTED Final   Staphylococcus species NOT DETECTED NOT DETECTED Final   Staphylococcus aureus (BCID) NOT DETECTED NOT DETECTED Final   Staphylococcus epidermidis NOT DETECTED NOT DETECTED Final   Staphylococcus lugdunensis NOT DETECTED NOT DETECTED Final   Streptococcus species NOT DETECTED NOT DETECTED Final   Streptococcus agalactiae NOT DETECTED NOT DETECTED Final   Streptococcus pneumoniae NOT DETECTED NOT DETECTED Final   Streptococcus pyogenes NOT DETECTED NOT DETECTED Final   A.calcoaceticus-baumannii NOT DETECTED NOT DETECTED Final   Bacteroides fragilis NOT DETECTED NOT DETECTED Final   Enterobacterales NOT DETECTED NOT DETECTED Final   Enterobacter cloacae complex NOT DETECTED NOT DETECTED Final   Escherichia coli NOT DETECTED NOT DETECTED Final   Klebsiella aerogenes NOT DETECTED NOT DETECTED Final   Klebsiella oxytoca NOT DETECTED NOT DETECTED Final   Klebsiella pneumoniae NOT DETECTED NOT DETECTED Final   Proteus species NOT DETECTED NOT DETECTED Final   Salmonella species NOT DETECTED NOT DETECTED Final    Serratia marcescens NOT DETECTED NOT DETECTED Final   Haemophilus influenzae NOT DETECTED NOT DETECTED Final   Neisseria meningitidis NOT DETECTED NOT DETECTED Final   Pseudomonas aeruginosa NOT DETECTED NOT DETECTED Final   Stenotrophomonas maltophilia NOT DETECTED NOT DETECTED Final   Candida albicans NOT DETECTED NOT DETECTED Final   Candida auris NOT DETECTED NOT DETECTED Final   Candida glabrata NOT DETECTED NOT DETECTED Final   Candida krusei NOT DETECTED NOT DETECTED Final   Candida parapsilosis NOT DETECTED NOT DETECTED Final   Candida tropicalis NOT DETECTED NOT DETECTED Final   Cryptococcus neoformans/gattii NOT DETECTED NOT DETECTED Final   Vancomycin resistance NOT DETECTED NOT DETECTED Final    Comment: Performed at [facility], [address], [place], [place] [address]  Respiratory (~20 pathogens) panel by PCR     Status: None   Collection Time: 06/02/2022  2:50 PM   Specimen: Nasopharyngeal Swab; Respiratory  Result Value Ref Range Status   Adenovirus NOT DETECTED NOT DETECTED Final   Coronavirus 229E NOT DETECTED NOT DETECTED Final    Comment: (NOTE) The Coronavirus on the Respiratory Panel, DOES NOT test for the novel  Coronavirus (2019 nCoV)    Coronavirus HKU1 NOT DETECTED NOT DETECTED Final   Coronavirus NL63 NOT DETECTED NOT DETECTED Final   Coronavirus OC43 NOT DETECTED NOT DETECTED Final   Metapneumovirus NOT DETECTED NOT DETECTED Final   Rhinovirus / Enterovirus NOT DETECTED NOT DETECTED Final   Influenza A NOT DETECTED NOT DETECTED Final   Influenza B NOT DETECTED NOT DETECTED Final   Parainfluenza Virus 1 NOT DETECTED NOT DETECTED Final   Parainfluenza Virus 2 NOT DETECTED NOT DETECTED Final   Parainfluenza Virus 3 NOT DETECTED NOT DETECTED Final   Parainfluenza Virus 4 NOT DETECTED NOT DETECTED Final   Respiratory Syncytial Virus NOT DETECTED NOT DETECTED Final   Bordetella pertussis NOT DETECTED NOT DETECTED Final   Bordetella Parapertussis NOT  DETECTED NOT DETECTED Final   Chlamydophila pneumoniae NOT DETECTED NOT DETECTED Final   Mycoplasma pneumoniae NOT DETECTED NOT DETECTED Final    Comment: Performed at [facility], [place]. [address], [place], [place] [address]  MRSA Next Gen by PCR, Nasal     Status: Abnormal   Collection Time: 05/16/2022  8:26 PM   Specimen: Nasal Mucosa; Nasal Swab  Result Value Ref Range Status   MRSA by PCR Next Gen DETECTED (A) NOT DETECTED Final    Comment: (NOTE) The GeneXpert MRSA Assay (FDA approved for NASAL  specimens only), is one component of a comprehensive MRSA colonization surveillance program. It is not intended to diagnose MRSA infection nor to guide or monitor treatment for MRSA infections. Test performance is not FDA approved in patients less than 3 years old. Performed at [facility], [place] [address] [place], [place] [address]   Culture, blood (Routine X 2) w Reflex to ID Panel     Status: None   Collection Time: 05/18/22  7:13 AM   Specimen: BLOOD LEFT ARM  Result Value Ref Range Status   Specimen Description   Final    BLOOD LEFT ARM AEROBIC BOTTLE ONLY Performed at [facility], [place] [address] [place], [place] [address]    Special Requests   Final    NONE Performed at [facility], [place] [address], [place], [place] [address]    Culture   Final    NO GROWTH 5 DAYS Performed at [facility], [place] [address] [place], [place] [address]    Report Status 05/23/2022 FINAL  Final  Culture, blood (Routine X 2) w Reflex to ID Panel     Status: None   Collection Time: 05/18/22  7:13 AM   Specimen: BLOOD LEFT ARM  Result Value Ref Range Status   Specimen Description   Final    BLOOD LEFT ARM AEROBIC BOTTLE ONLY Performed at [facility], [place] [address] [place] [address] [place], [place] [address]    Special Requests   Final    NONE Performed at [facility], [place] [address], [place], [place] [address]    Culture    Final    NO GROWTH 5 DAYS Performed at [facility], [place] [address], [place],  [address]    Report Status 05/23/2022 FINAL  Final     Pertinent Lab.    Latest Ref Rng & Units 05/29/2022    3:07 AM 05/28/2022    3:19 AM 05/27/2022    9:44 PM  CBC  WBC 4.0 - 10.5 K/uL 6.8  7.9    Hemoglobin 13.0 - 17.0 g/dL 7.2  7.6  8.0   Hematocrit 39.0 - 52.0 % 25.3  26.8  27.7   Platelets 150 - 400 K/uL 131  139        Latest Ref Rng & Units 05/29/2022    3:07 AM 05/28/2022    3:19 AM 05/27/2022    3:30 AM  CMP  Glucose 70 - 99 mg/dL 98  99  92   BUN 8 - 23 mg/dL 53  46  46   Creatinine 0.61 - 1.24 mg/dL 1.82  1.68  1.45   Sodium 135 - 145 mmol/L 148  143  147   Potassium 3.5 - 5.1 mmol/L 4.1  3.9  3.6   Chloride 98 - 111 mmol/L 106  103  106   CO2 22 - 32 mmol/L 37  33  33   Calcium 8.9 - 10.3 mg/dL 10.3  9.9  10.0   Total Protein 6.5 - 8.1 g/dL 5.9  5.5  5.6   Total Bilirubin 0.3 - 1.2 mg/dL 0.7  0.7  0.6   Alkaline Phos 38 - 126 U/L 57  59  56   AST 15 - 41 U/L '14  15  13   '$ ALT 0 - 44 U/L '12  12  10      '$ Pertinent Imaging today Plain films and CT images have been personally visualized and interpreted; radiology reports  have been reviewed. Decision making incorporated into the Impression / Recommendations.  DG CHEST PORT 1 VIEW  Result Date: 05/27/2022 CLINICAL DATA:  Shortness of breath. EXAM: PORTABLE CHEST 1 VIEW COMPARISON:  Radiograph 05/26/2022. FINDINGS: Prior median sternotomy and mitral valve repair. Stable heart size and mediastinal contours with cardiomegaly. Worsening bilateral pleural effusions. Worsening heterogeneous bilateral lung opacities. No pneumothorax. IMPRESSION: 1. Worsening bilateral pleural effusions. Worsening heterogeneous bilateral lung opacities, which may represent multifocal pneumonia, pulmonary edema, or combination there of. 2. Stable cardiomegaly. Electronically Signed   By: Keith Rake M.D.   On: 05/29/2022 20:13   ECHO  TEE  Result Date: 05/23/2022    TRANSESOPHOGEAL ECHO REPORT   Patient Name:   Daniel Sharp Date of Exam: 05/16/2022 Medical Rec #:  [id number]   Height:       73.0 in Accession #:    [id number]  Weight:       203.5 lb Date of Birth:  Apr 07, 1933   BSA:          2.167 m Patient Age:    67 years    BP:           101/62 mmHg Patient Gender: M           HR:           81 bpm. Exam Location:  Inpatient Procedure: Transesophageal Echo, Cardiac Doppler, Color Doppler and 3D Echo Indications:    Bacteremia  History:        Patient has prior history of Echocardiogram examinations, most                 recent 05/18/2022. Mitral Valve Disease, Arrythmias:Atrial                 Fibrillation, Signs/Symptoms:Dyspnea; Risk Factors:Hypertension.                 Mitral Valve: prosthetic annuloplasty ring valve is                 present in the mitral position. Procedure Date: 2005. COVID-19                 infection.                  Mitral Valve: prosthetic prosthetic annuloplasty ring valve is                 present in the mitral position. Procedure Date: 2006 [facility]).  Sonographer:    Clayton Lefort RDCS (AE) Referring Phys: Lonn Georgia PROCEDURE: After discussion of the risks and benefits of a TEE, an informed consent was obtained from the patient. The transesophogeal probe was passed without difficulty through the esophogus of the patient. Sedation performed by different physician. The patient was monitored while under deep sedation. Anesthestetic sedation was provided intravenously by Anesthesiology: 83.'07mg'$  of Propofol. Image quality was good. The patient developed no complications during the procedure.  IMPRESSIONS  1. Left ventricular ejection fraction, by estimation, is 65 to 70%. The left ventricle has hyperdynamic function. The left ventricle has no regional wall motion abnormalities. Left ventricular diastolic function could not be evaluated.  2. Right ventricular systolic function is mildly reduced. The right  ventricular size is mildly enlarged. There is severely elevated pulmonary artery systolic pressure. The estimated right ventricular systolic pressure is 38.1 mmHg.  3. The left atrial appendage appears to effectively surgically amputated. The MR jet washes the left atrial appendage stump with each beat. Left atrial size  was severely dilated. No left atrial/left atrial appendage thrombus was detected.  4. Right atrial size was severely dilated.  5. There is a previously placed mitral annuloplasty ring (incomplete "C" ring, size and type unkown) that appears well seated. There is flail motion of the medial (P3) scallop of the posterior leaflet with visible ruptured chordae. By PISA the effective  regurgitant orfice area is 0.88 cm sq, the regurgitant volume is 108 ml, regurgitant fraction 65%. The mitral valve is myxomatous. Severe mitral valve regurgitation. No evidence of mitral stenosis. The mean mitral valve gradient is 3.6 mmHg with average  heart rate of 77 bpm. There is a prosthetic prosthetic annuloplasty ring present in the mitral position. Procedure Date: 2006 [facility]).  6. Tricuspid valve regurgitation is moderate.  7. The aortic valve is tricuspid. There is mild calcification of the aortic valve. There is mild thickening of the aortic valve. Aortic valve regurgitation is mild. Aortic valve sclerosis/calcification is present, without any evidence of aortic stenosis.  8. There is Moderate (Grade III) protruding plaque involving the aortic arch and descending aorta. Conclusion(s)/Recommendation(s): The appearance is in keeping with progression of myxomatous valve disease, with a new flail P3 scallop of the mitral valve. There is no clear evidence of endocarditis (no ring dehiscence, annular abscess, vegetations or perforation), although endocarditis cannot be firmly excluded in this patient with prosthetic material. Anticipate lower than expected cardioembolic risk from atrial fibrillation in the setting of  surgically amputated left atrial appendage and vigorous "washing" of the left atrium by the the high velocity mitral insufficiency jet. FINDINGS  Left Ventricle: Left ventricular ejection fraction, by estimation, is 65 to 70%. The left ventricle has hyperdynamic function. The left ventricle has no regional wall motion abnormalities. The left ventricular internal cavity size was normal in size. Abnormal (paradoxical) septal motion consistent with post-operative status. Left ventricular diastolic function could not be evaluated due to atrial fibrillation. Left ventricular diastolic function could not be evaluated. Right Ventricle: The right ventricular size is mildly enlarged. No increase in right ventricular wall thickness. Right ventricular systolic function is mildly reduced. There is severely elevated pulmonary artery systolic pressure. The tricuspid regurgitant velocity is 3.75 m/s, and with an assumed right atrial pressure of 8 mmHg, the estimated right ventricular systolic pressure is 00.9 mmHg. Left Atrium: The left atrial appendage appears to effectively surgically amputated. The MR jet washes the left atrial appendage stump with each beat. Left atrial size was severely dilated. No left atrial/left atrial appendage thrombus was detected. Right Atrium: Right atrial size was severely dilated. Pericardium: There is no evidence of pericardial effusion. Mitral Valve: There is a previously placed mitral annuloplasty ring (incomplete "C" ring, size and type unkown) that appears well seated. There is flail motion of the medial (P3) scallop of the posterior leaflet with visible ruptured chordae. By PISA the  effective regurgitant orfice area is 0.88 cm sq, the regurgitant volume is 108 ml, regurgitant fraction 65%. The mitral valve is myxomatous. Severe mitral valve regurgitation, with eccentric laterally directed jet. There is a prosthetic prosthetic annuloplasty ring present in the mitral position. Procedure Date:  [facility]). No evidence of mitral valve stenosis. The mean mitral valve gradient is 3.6 mmHg with average heart rate of 77 bpm. Tricuspid Valve: The tricuspid valve is normal in structure. Tricuspid valve regurgitation is moderate. Aortic Valve: The aortic valve is tricuspid. There is mild calcification of the aortic valve. There is mild thickening of the aortic valve. Aortic valve regurgitation is mild. Aortic  valve sclerosis/calcification is present, without any evidence of aortic stenosis. Pulmonic Valve: The pulmonic valve was grossly normal. Pulmonic valve regurgitation is not visualized. Aorta: The aortic root, ascending aorta, aortic arch and descending aorta are all structurally normal, with no evidence of dilitation or obstruction. There is moderate (Grade III) protruding plaque involving the aortic arch and descending aorta. Venous: A pattern of systolic flow reversal, suggestive of severe mitral regurgitation is recorded from the left upper pulmonary vein and the right upper pulmonary vein. IAS/Shunts: No atrial level shunt detected by color flow Doppler.  LEFT VENTRICLE PLAX 2D LVOT diam:     2.70 cm LV SV:         59 LV SV Index:   27 LVOT Area:     5.71 cm  AORTIC VALVE LVOT Vmax:   71.91 cm/s LVOT Vmean:  40.607 cm/s LVOT VTI:    0.103 m MITRAL VALVE                 TRICUSPID VALVE MV Area (PHT): 3.11 cm      TR Peak grad:   56.2 mmHg MV Mean grad:  3.6 mmHg      TR Vmax:        375.00 cm/s MV Decel Time: 244 msec MR Peak grad:   67.8 mmHg    SHUNTS MR Vmax:        411.65 cm/s  Systemic VTI:  0.10 m MR Vmean:       329.2 cm/s   Systemic Diam: 2.70 cm MR PISA:        5.81 cm MR PISA Radius: 0.96 cm Mihai Croitoru MD Electronically signed by Sanda Klein MD Signature Date/Time: 06/06/2022/3:18:02 PM    Final    DG CHEST PORT 1 VIEW  Result Date: 05/20/2022 CLINICAL DATA:  COVID-19 positive EXAM: PORTABLE CHEST 1 VIEW COMPARISON:  05/17/2022 FINDINGS: Stable heart size status post  sternotomy, CABG, and cardiac valve repair. Extensive bilateral airspace consolidations, right worse than left. Right lower lobe consolidation may be slightly worse compared to the prior exam. Small right pleural effusion. No pneumothorax. IMPRESSION: Persistent extensive bilateral airspace consolidations. Right lower lobe consolidation may be slightly worse compared to the prior exam. Electronically Signed   By: Davina Poke D.O.   On: 05/12/2022 13:55   ECHOCARDIOGRAM LIMITED  Result Date: 05/18/2022    ECHOCARDIOGRAM LIMITED REPORT   Patient Name:   Daniel Sharp Date of Exam: 05/18/2022 Medical Rec #:  [id number]   Height:       73.0 in Accession #:    [id number]  Weight:       192.7 lb Date of Birth:  Sep 12, 1932   BSA:          2.118 m Patient Age:    67 years    BP:           115/53 mmHg Patient Gender: M           HR:           92 bpm. Exam Location:  Inpatient Procedure: Limited Echo, Cardiac Doppler and Color Doppler Indications:    Bacteremia  History:        Patient has prior history of Echocardiogram examinations, most                 recent 04/25/2022. Arrythmias:Atrial Fibrillation,                 Signs/Symptoms:Dyspnea; Risk Factors:Hypertension.  Mitral Valve: prosthetic annuloplasty ring valve is present in                 the mitral position. Procedure Date: 2005.  Sonographer:    Eartha Inch Referring Phys: [id number] Mignon Pine  Sonographer Comments: Image acquisition challenging due to patient body habitus. IMPRESSIONS  1. Limited Echo to evaluate for infective endocarditis  2. Mitral valve repair s/p annuloplasty ring in 2005  3. No evidence of vegetations on mitral valve or aortic valve. Consider TEE if clinical suspicion is high.  4. Left ventricular ejection fraction, by estimation, is 55 to 60%. The left ventricle has normal function.  5. The mitral valve is degenerative. Mild mitral regurgitation. No mitral stenosis.  6. The aortic valve is normal in  structure. Aortic valve regurgitation is trivial. No aortic stenosis is present.  7. The inferior vena cava is dilated in size with <50% respiratory variability, suggesting right atrial pressure of 15 mmHg. Comparison(s): No significant change from prior study. FINDINGS  Left Ventricle: Left ventricular ejection fraction, by estimation, is 55 to 60%. The left ventricle has normal function. Mitral Valve: The mitral valve is degenerative in appearance. Mild to moderate mitral annular calcification. Mild mitral valve regurgitation. There is a prosthetic annuloplasty ring present in the mitral position. Procedure Date: 2005. No evidence of mitral valve stenosis. MV peak gradient, 12.8 mmHg. The mean mitral valve gradient is 5.0 mmHg. There is no evidence of mitral valve vegetation. Aortic Valve: The aortic valve is normal in structure. Aortic valve regurgitation is trivial. No aortic stenosis is present. There is no evidence of aortic valve vegetation. Venous: The inferior vena cava is dilated in size with less than 50% respiratory variability, suggesting right atrial pressure of 15 mmHg. Additional Comments: Spectral Doppler performed. Color Doppler performed.   LV Volumes (MOD) LV vol d, MOD A2C: 203.0 ml Diastology LV vol d, MOD A4C: 163.0 ml LV e' medial:  7.73 cm/s LV vol s, MOD A2C: 110.0 ml LV e' lateral: 8.93 cm/s LV vol s, MOD A4C: 88.6 ml LV SV MOD A2C:     93.0 ml LV SV MOD A4C:     163.0 ml LV SV MOD BP:      79.7 ml IVC IVC diam: 2.80 cm MITRAL VALVE MV Peak grad: 12.8 mmHg MV Mean grad: 5.0 mmHg MV Vmax:      1.79 m/s MV Vmean:     106.0 cm/s MR Peak grad: 78.9 mmHg MR Mean grad: 53.0 mmHg MR Vmax:      444.00 cm/s MR Vmean:     346.0 cm/s Vishnu Priya Mallipeddi Electronically signed by Lorelee Cover Mallipeddi Signature Date/Time: 05/18/2022/1:37:14 PM    Final    DG CHEST PORT 1 VIEW  Result Date: 05/17/2022 CLINICAL DATA:  Productive cough.  COVID positive. EXAM: PORTABLE CHEST 1 VIEW COMPARISON:   Chest x-ray dated May 15, 2022. FINDINGS: Unchanged mild cardiomegaly status post CABG and mitral annulus repair. Consolidation in the right mid and lower lung is not significantly changed. Similar patchy airspace opacities in the left mid lung. Unchanged small bilateral pleural effusions. No pneumothorax. No acute osseous abnormality. IMPRESSION: 1. Unchanged multifocal pneumonia and small bilateral pleural effusions. Electronically Signed   By: Titus Dubin M.D.   On: 05/17/2022 09:35   DG Chest Portable 1 View  Result Date: 06/04/2022 CLINICAL DATA:  Recent diagnosis of COVID pneumonia 2 days ago. Cough. EXAM: PORTABLE CHEST 1 VIEW COMPARISON:  04/24/2022 FINDINGS: Previous median sternotomy  for mitral valve repair and CABG. Stable cardiomediastinal contours. Small right pleural effusion is suspected. Diffuse hazy opacities are noted throughout both lungs. More focal airspace consolidation within the right midlung and right lower lung is identified which appears increased compared with the exam from 04/17/2022. IMPRESSION: 1. Diffuse hazy opacities throughout both lungs compatible with more focal airspace opacification within the right midlung and right lower lung compatible with pneumonia. 2. Suspect small right pleural effusion. Electronically Signed   By: Kerby Moors M.D.   On: 06/02/2022 14:22     I spent 60  minutes for this patient encounter including review of prior medical records, coordination of care with primary/other specialist with greater than 50% of time being face to face/counseling and discussing diagnostics/treatment plan with the patient/family.  Electronically signed by:   Rosiland Oz, MD Infectious Disease Physician [facility] Pager: [phone]

## 2022-05-29 NOTE — Progress Notes (Signed)
AVAPS placed on pt due to WOB and AMS.

## 2022-05-29 NOTE — Progress Notes (Signed)
Daily Progress Note   Patient Name: Daniel Sharp       Date: 05/29/2022 DOB: January 30, 1933  Age: 88 y.o. MRN#: [id number] Attending Physician: Dwyane Dee, MD Primary Care Physician: [name], MD Admit Date: 05/22/2022 Length of Stay: 14 days  Reason for Consultation/Follow-up: Establishing goals of care  Subjective:   CC: Patient confused and able to mumble simple words at times.  Following up regarding complex medical decision making.  Spoke with patient's son, reported HCPOA, Sabre Leonetti outside of room along with primary hospitalist Dr. Sabino Gasser.  Subjective:  Discussed care with primary hospitalist Dr. Sabino Gasser. Patient seen laying in bed. Increased work of breathing still noted.   This provider was able to later join in conversation outside of the room with son, [name], and primary hospitalist Dr. Sabino Gasser.  Able to discuss again the idea that patient has medical conditions that are not medically fixable as patient is not a candidate for interventions on his heart valve.  Patient's hemoglobin continues to drop again due likely shearing from his heart valve; currently at 7.2.  Kidney function worsening.  Sodium increasing as well.   Son noted that patient was actually a little more alert yesterday afternoon after wearing BiPAP though still confused.  Apparently patient asked for BiPAP due to increased work of breathing.  We discussed the idea of BiPAP and how it is an intervention to help with respiratory effort though there are other interventions we could use to help alleviate the symptoms of dyspnea and anxiety associated with it.  Discussed use of opioids when patient is comfort care to help alleviate dyspnea and increased work of breathing so that patients do not necessarily need to keep BiPAP in place since it can become uncomfortable especially for prolonged periods of time.  Discussed the idea of comfort care would mean acceptance that patient's end-of-life is near.  Son asked  about realistic prognosis.  Expressed worry that with his current results showing his body is "shutting down", worried we are looking at days to short weeks.  Patient is so ill that he could pass away at any time.  Son acknowledged hearing this and that he has been telling family that any moment could be the patient's last breath.    Son inquired what would occur after transition to comfort care.  Discussed the idea that comfort care protocol could be initiated while in the hospital.  If patient is stable for potential transfer, hospice liaison could be contacted for potential inpatient hospice referral for GIP admission for symptom management.  Explained that patients are not guaranteed admission to inpatient hospice and have to be evaluated by the hospice's medical director for appropriateness.  Expressed the benefits of hospice and the support it could provide and for the patient and the family.  Son voiced hearing this.  Son stating that he wants to have a phone call with his wife and 3 children tomorrow morning to discuss the idea of transitioning to comfort care to see how they feel about it.  He noted patient's other son, his brother, will be coming tonight and so we will have a chance to visit with the patient as well.  Encouraged visiting at this time due to patient's deteriorating situation.  Also offered recommendation that patient is so ill that escalation of care would not bring benefit to quality of life.  Explained that things such as medications to support hypotension or even giving blood is not bringing quality time to the patient and can  instead cause distressing symptoms such as increased work of breathing.  Son agreeing to no escalation in care at this time and patient no longer receiving hemoglobin transfusions.  Noted this provider and primary hospitalist with plan to follow-up with son tomorrow regarding decisions about possible transition to comfort focused care. All questions  answered at that time.  Thanked son for discussion today.  Review of Systems  Objective:   Vital Signs:  BP (!) 126/48   Pulse (!) 43   Temp (!) 97.5 F (36.4 C) (Axillary)   Resp (!) 37   Ht '6\' 1"'$  (1.854 m)   Wt 91.8 kg   SpO2 (!) 87%   BMI 26.70 kg/m   Physical Exam: General: Ill appearing, confused, mumbling words at times HENT: Dry mucous membranes Cardiovascular: bradycardic at times, anasarca present in all extremities Respiratory:  increased work of breathing noted with an increased respiratory rate r Abdomen: not distended Extremities: Warm to touch Skin: Multiple ecchymoses on upper extremities bilaterally Neuro: Confused,  Psych: Unable to fully due to confusion  Imaging:  I personally reviewed recent imaging.   Assessment & Plan:   Assessment: 88 year old gentleman with hypertension atrial fibrillation chronic hypercapnic respiratory failure uses BiPAP at night, history of mitral valve repair in the past, history of myasthenia gravis and several other comorbidities, recently diagnosed with cancer, admitted from rehab facility with COVID-pneumonia.  Remains admitted to hospital medicine service for acute on chronic hypoxic and hypercarbic respiratory failure secondary to COVID-pneumonia, also has superimposed bacterial pneumonia, acute on chronic diastolic congestive heart failure, patient underwent TEE and is found to have a mitral valve with severe eccentric mitral regurgitation and ruptured chordae with no evidence of vegetation.  Has Enterococcus in his bloodstream and is on IV antibiotics with infectious disease specialist following. Remains admitted to hospital medicine service and a palliative consultation for ongoing goals of care discussions has been requested.  Recommendations/Plan: # Complex medical decision making/goals of care:  - Patient unable to participate in complex medical decision making due to his medical status.  -Spoke with son, Jesse Nosbisch,  extensively outside of room along with primary hospitalist Dr.Girguis.  Discussed patient's continued deterioration and likelihood patient is reaching the end of his life, potentially in terms of days to short weeks (in setting of severe MR, debility, worsening renal function, decreasing hemoglobin, confusion, decreased oral intake/output).  In this setting, discussed recommendations for comfort focused care.  Son planning to discuss this idea with his brother, wife, and children within the next 24 hours to determine a decision.  -Discussed idea with son that due to patient's deteriorating medical status, do not recommend escalating medical interventions such as adding pressors for hypotension.  Discussed that at this time due to patient's poor heart valve, even interventions such as providing more fluids with hemoglobin is likely causing increased work of breathing.  Son agreeing to not escalate level of care at this time and for patient to no longer receive blood transfusions.  -  Code Status: DNR Prognosis: days to weeks  # Symptom management:  -Discussed idea of comfort focused care with son.  Described in detail that opioids could be used to help alleviate symptom of dyspnea patients can experience at the end of life.  Also discussed patient would likely benefit from medication for anxiety as breathlessness can cause this feeling.  Son considering transition to comfort focused care.  # Psychosocial Support:  -Son: Wah Sabic reported he is HCPOA; patient has another son, Damita Dunnings, who  will be coming to visit tomorrow.  # Discharge Planning: TBD  -Discussed with son today potential transition to comfort focused care with inpatient hospice referral for GIP management in setting of symptom burden.  Awaiting son's decision.  Discussed with: Bedside RN, family, hospitalist  Thank you for allowing the palliative care team to participate in the care [name].  Chelsea Aus, DO Palliative Care  Provider PMT # [phone]  This provider spent a total of 51 minutes providing patient's care.  Includes review of EMR, discussing care with other staff members involved in patient's medical care, obtaining relevant history and information from patient and/or patient's family, and personal review of imaging and lab work. Greater than 50% of the time was spent counseling and coordinating care related to the above assessment and plan.

## 2022-05-29 NOTE — Progress Notes (Signed)
PT Cancellation Note  Patient Details Name: Daniel Sharp MRN: [id number] DOB: 08-17-1932   Cancelled Treatment:    Reason Eval/Treat Not Completed: Medical issues which prohibited therapy, continues to require BiPAP, Palliative  medicine following. Will check back 11/24. [place] Office [phone] Weekend [phone]    Claretha Cooper 05/29/2022, 7:11 AM

## 2022-05-29 NOTE — Progress Notes (Signed)
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  He has had multiple prolonged hospitalizations since October 2023.  After last hospitalization, he was discharged to rehab and had mild improvement over approximately 2 weeks but then developed shortness of breath and was sent back to the hospital.  He was found to be positive for COVID-19. He has had gradual but progressive decline in overall since initial hospitalizations. He has been followed by palliative care as well.  Due to poor functional status and failure to thrive, GOC discussions were initiated and carried on throughout hospitalization as well.  Interval History:  Patient well-known to me from prior hospitalizations.  Spoke with son at length in hallway along with Dr. Vinetta Bergamo.  We had an extensive discussion regarding the unfortunate decline since October and that meaningful recovery at this time seems very unlikely; furthermore that Daniel Sharp seems to be rather uncomfortable and dyspneic quite often.  Myself and Dr. Vinetta Bergamo recommended transitioning to comfort care/residential hospice. Daniel Sharp plans to think about this and let his brother visit some tomorrow then make further decisions at that time.  Upon my exam of patient, he was SOB/dyspneic with air hunger perceived. He is very weak and deconditioned compared to the last time I saw him. He does not have the full ability to make adequate decisions regarding his EOL care.   Assessment and Plan:  Failure to thrive  Goals of care - patient has had significant decline since early October, unfortunately not unexpected given underlying diagnoses of rectal adenocarcinoma, tubular  adenoma with high-grade dysplasia, chronic anemia, recurrent bacteremia, pneumonia, severe MR, and multiple prolonged hospitalizations -At this time, patient does not have the physical reserve to overcome such a significant burden he has sustained with multiple infections, hospitalizations, and underlying malignancy; he is imminently approaching end-of-life.  Discussed with son that any further treatment is futile medical care at this time unfortunately.  Patient appears to be truly uncomfortable and with significant episodes of air hunger/dyspnea.  Nutritional intake has also declined and is playing a factor in prognosis as well  - recommendation from myself and palliative care is to pursue comfort care/hospice at this time; patient does not have capacity to decide disposition. Daniel Sharp plans to let his brother visit on 11/23, then we will likely try and transition into a comfort care context   Acute on chronic hypoxic hypercarbic respiratory failure CAP COVID-19 pneumonia  - s/p lasix - s/p remdesivir - s/p BIPAP; trying to transition away from this if and when decision is made to transition to comfort    Acute metabolic encephalopathy -recent EKG reviewed, unremarkable QTc.  -Family reports improvement with trial of seroquel to maintain day/night cycle - etiology also likely largely due to his overall failure to thrive   Recurrent E faecalis bacteremia:  -Patient was admitted in October when he was diagnosed with rectal adenocarcinoma and double bacteremia shortly after colonoscopy.   -blood cultures growing Enterococcus fecalis again.   -ID consulted with recs to cont on Rocephin and ampicillin -Underwent TEE 11/17, reviewed. No vegetations, however there are concerns for interval significantly worsened MR. 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 Irene Limbo   Severe MR -Marked worsening since 8/23 noted on recent TEE -Pt's son had long discussion with Dr. Sallyanne Kuster regarding TEE results and that they understand pt is not surgical candidate -On 11/19, I had discussed case and findings with on-call CT surgeon, Dr. Kipp Brood. Per CT Surgery, given hx of prior mitral valve surgery, advanced age, hemodynamic instability, and medical frailty, patient is not a candidate for surgical intervention -Appreciate assistance by Palliative Care   BPH: Continue Flomax.   History of paroxysmal atrial fibrillation:  -Patient not on any rate control medications or any anticoagulation.    Hyperkalemia: Resolved.   CKD stage IIIa -renal function stable at this time -repeat bmet in AM   Hypernatremia:  -Normalized   Anemia of chronic disease IDA -Has been receiving iron supplementation PTA -Last iron level of 23 on 11/1. Repeat iron of 36 with iron sat of 25 - likely some slow chronic loss from rectal cancer as well - Hgb still downtrending; further transfusions not likely to help and may cause further volume overload/dyspnea. Renal function also poor and lasix may not help either - need to focus on comfort at this time   Acute thrombocytopenia:  -Likely secondary to acute illness.  Lovenox discontinued -Plts stable    Old records reviewed in assessment of this patient   DVT prophylaxis:  Place TED hose Start: 05/23/22 1611 Place and maintain sequential compression device Start: 05/16/2022 1033   Code Status:   Code Status: DNR  Mobility Assessment (last 72 hours)     Mobility Assessment     Row Name 05/28/22 0745 05/27/22 0910         Does patient have an order for bedrest or is patient medically unstable No - Continue assessment No - Continue assessment       What is the highest level of mobility based on the progressive mobility assessment? Level 2 (Chairfast) - Balance while sitting on edge of bed and cannot stand Level 2 (Chairfast) - Balance while sitting on edge of bed and cannot stand      Is the above level different from baseline mobility prior to current illness? Yes - Recommend PT order Yes - Recommend PT order               Barriers to discharge:  Disposition Plan:  Pending further GOC; possibly residential hospice vs inpatient death Status is: Inpt  Objective: Blood pressure 114/62, pulse 89, temperature (!) 97.4 F (36.3 C), temperature source Oral, resp. rate (!) 28, height '6\' 1"'$  (1.854 m), weight 91.8 kg, SpO2 100 %.  Examination:  Physical Exam Constitutional:      Comments: Chronically ill-appearing elderly gentleman lying in bed appearing dyspneic and uncomfortable  HENT:     Head: Normocephalic and atraumatic.     Mouth/Throat:     Mouth: Mucous membranes are dry.  Eyes:     Extraocular Movements: Extraocular movements intact.  Cardiovascular:     Rate and Rhythm: Normal rate and regular rhythm.  Pulmonary:     Comments: Coarse breath sounds bilaterally Abdominal:     General: Bowel sounds are normal. There is no distension.     Palpations: Abdomen is soft.  Tenderness: There is abdominal tenderness.  Musculoskeletal:        General: Swelling present.     Cervical back: Normal range of motion.     Comments: edema throughout all extremities  Skin:    Comments: Scattered purpura  Neurological:     Comments: Moves all 4 extremities and follows some commands      Consultants:  Palliative care  Procedures:    Data Reviewed: Results for orders placed or performed during the hospital encounter of 05/23/2022 (from the past 24 hour(s))  Comprehensive metabolic panel     Status: Abnormal   Collection Time: 05/29/22  3:07 AM  Result Value Ref Range   Sodium 148 (H) 135 - 145 mmol/L   Potassium 4.1 3.5 -  5.1 mmol/L   Chloride 106 98 - 111 mmol/L   CO2 37 (H) 22 - 32 mmol/L   Glucose, Bld 98 70 - 99 mg/dL   BUN 53 (H) 8 - 23 mg/dL   Creatinine, Ser 1.82 (H) 0.61 - 1.24 mg/dL   Calcium 10.3 8.9 - 10.3 mg/dL   Total Protein 5.9 (L) 6.5 - 8.1 g/dL   Albumin 2.7 (L) 3.5 - 5.0 g/dL   AST 14 (L) 15 - 41 U/L   ALT 12 0 - 44 U/L   Alkaline Phosphatase 57 38 - 126 U/L   Total Bilirubin 0.7 0.3 - 1.2 mg/dL   GFR, Estimated 36 (L) >60 mL/min   Anion gap 5 5 - 15  CBC     Status: Abnormal   Collection Time: 05/29/22  3:07 AM  Result Value Ref Range   WBC 6.8 4.0 - 10.5 K/uL   RBC 2.39 (L) 4.22 - 5.81 MIL/uL   Hemoglobin 7.2 (L) 13.0 - 17.0 g/dL   HCT 25.3 (L) 39.0 - 52.0 %   MCV 105.9 (H) 80.0 - 100.0 fL   MCH 30.1 26.0 - 34.0 pg   MCHC 28.5 (L) 30.0 - 36.0 g/dL   RDW 17.5 (H) 11.5 - 15.5 %   Platelets 131 (L) 150 - 400 K/uL   nRBC 0.0 0.0 - 0.2 %    I have Reviewed nursing notes, Vitals, and Lab results since pt's last encounter. Pertinent lab results : see above I have ordered test including BMP, CBC, Mg I have reviewed the last note from staff over past 24 hours I have discussed pt's care plan and test results with nursing staff, case manager   LOS: 14 days   Dwyane Dee, MD [facility] 05/29/2022, 2:04 PM

## 2022-05-29 NOTE — Progress Notes (Signed)
OT Cancellation Note  Patient Details Name: Crescencio Jozwiak MRN: [id number] DOB: Sep 29, 1932   Cancelled Treatment:    Reason Eval/Treat Not Completed: Medical issues which prohibited therapy Patient is currently on BiPAP with palliative following at this time. OT to continue to follow and check back as schedule will allow. [name], MS Acute Rehabilitation Department Office# [phone] 05/29/2022, 7:17 AM

## 2022-05-30 DIAGNOSIS — Z79899 Other long term (current) drug therapy: Secondary | ICD-10-CM

## 2022-05-30 DIAGNOSIS — R52 Pain, unspecified: Secondary | ICD-10-CM

## 2022-05-30 DIAGNOSIS — U071 COVID-19: Secondary | ICD-10-CM | POA: Diagnosis not present

## 2022-05-30 DIAGNOSIS — K117 Disturbances of salivary secretion: Secondary | ICD-10-CM

## 2022-05-30 DIAGNOSIS — C20 Malignant neoplasm of rectum: Secondary | ICD-10-CM | POA: Diagnosis not present

## 2022-05-30 DIAGNOSIS — A498 Other bacterial infections of unspecified site: Secondary | ICD-10-CM | POA: Diagnosis not present

## 2022-05-30 DIAGNOSIS — R627 Adult failure to thrive: Secondary | ICD-10-CM

## 2022-05-30 DIAGNOSIS — J9621 Acute and chronic respiratory failure with hypoxia: Secondary | ICD-10-CM | POA: Diagnosis not present

## 2022-05-30 DIAGNOSIS — G9341 Metabolic encephalopathy: Secondary | ICD-10-CM | POA: Diagnosis not present

## 2022-05-30 DIAGNOSIS — R0600 Dyspnea, unspecified: Secondary | ICD-10-CM

## 2022-05-30 DIAGNOSIS — R451 Restlessness and agitation: Secondary | ICD-10-CM

## 2022-05-30 DIAGNOSIS — Z66 Do not resuscitate: Secondary | ICD-10-CM

## 2022-05-30 LAB — CBC WITH DIFFERENTIAL/PLATELET
Abs Immature Granulocytes: 0.05 10*3/uL (ref 0.00–0.07)
Basophils Absolute: 0 10*3/uL (ref 0.0–0.1)
Basophils Relative: 0 %
Eosinophils Absolute: 0.1 10*3/uL (ref 0.0–0.5)
Eosinophils Relative: 1 %
HCT: 25.6 % — ABNORMAL LOW (ref 39.0–52.0)
Hemoglobin: 7.1 g/dL — ABNORMAL LOW (ref 13.0–17.0)
Immature Granulocytes: 1 %
Lymphocytes Relative: 8 %
Lymphs Abs: 0.8 10*3/uL (ref 0.7–4.0)
MCH: 29.6 pg (ref 26.0–34.0)
MCHC: 27.7 g/dL — ABNORMAL LOW (ref 30.0–36.0)
MCV: 106.7 fL — ABNORMAL HIGH (ref 80.0–100.0)
Monocytes Absolute: 0.6 10*3/uL (ref 0.1–1.0)
Monocytes Relative: 6 %
Neutro Abs: 8.3 10*3/uL — ABNORMAL HIGH (ref 1.7–7.7)
Neutrophils Relative %: 84 %
Platelets: 141 10*3/uL — ABNORMAL LOW (ref 150–400)
RBC: 2.4 MIL/uL — ABNORMAL LOW (ref 4.22–5.81)
RDW: 17.6 % — ABNORMAL HIGH (ref 11.5–15.5)
WBC: 9.8 10*3/uL (ref 4.0–10.5)
nRBC: 0 % (ref 0.0–0.2)

## 2022-05-30 LAB — BASIC METABOLIC PANEL
Anion gap: 7 (ref 5–15)
BUN: 54 mg/dL — ABNORMAL HIGH (ref 8–23)
CO2: 33 mmol/L — ABNORMAL HIGH (ref 22–32)
Calcium: 10.2 mg/dL (ref 8.9–10.3)
Chloride: 105 mmol/L (ref 98–111)
Creatinine, Ser: 1.99 mg/dL — ABNORMAL HIGH (ref 0.61–1.24)
GFR, Estimated: 32 mL/min — ABNORMAL LOW (ref >60)
Glucose, Bld: 94 mg/dL (ref 70–99)
Potassium: 4.2 mmol/L (ref 3.5–5.1)
Sodium: 145 mmol/L (ref 135–145)

## 2022-05-30 LAB — MAGNESIUM: Magnesium: 2.3 mg/dL (ref 1.7–2.4)

## 2022-05-30 MED ORDER — HALOPERIDOL 1 MG PO TABS: 0.5000 mg | ORAL_TABLET | ORAL | Status: DC | PRN

## 2022-05-30 MED ORDER — POLYVINYL ALCOHOL 1.4 % OP SOLN: 1.0000 [drp] | Freq: Four times a day (QID) | OPHTHALMIC | Status: DC | PRN

## 2022-05-30 MED ORDER — GLYCOPYRROLATE 1 MG PO TABS: 1.0000 mg | ORAL_TABLET | ORAL | Status: DC | PRN

## 2022-05-30 MED ORDER — HYDROMORPHONE HCL 1 MG/ML IJ SOLN: 0.3000 mg | INTRAMUSCULAR | Status: DC | PRN

## 2022-05-30 MED ORDER — HALOPERIDOL LACTATE 5 MG/ML IJ SOLN
2.0000 mg | Freq: Once | INTRAMUSCULAR | Status: AC
  Administered 2022-05-30: 2 mg via INTRAVENOUS
  Filled 2022-05-30: qty 1

## 2022-05-30 MED ORDER — HYDROMORPHONE BOLUS VIA INFUSION: 0.2000 mg | INTRAVENOUS | Status: DC | PRN

## 2022-05-30 MED ORDER — LORAZEPAM 2 MG/ML PO CONC: 1.0000 mg | ORAL | Status: DC | PRN

## 2022-05-30 MED ORDER — SODIUM CHLORIDE 0.9 % IV SOLN
0.2000 mg/h | INTRAVENOUS | Status: DC
  Administered 2022-05-30: 0.2 mg/h via INTRAVENOUS
  Filled 2022-05-30: qty 5

## 2022-05-30 MED ORDER — LORAZEPAM 1 MG PO TABS: 1.0000 mg | ORAL_TABLET | ORAL | Status: DC | PRN

## 2022-05-30 MED ORDER — GLYCOPYRROLATE 0.2 MG/ML IJ SOLN: 0.2000 mg | INTRAMUSCULAR | Status: DC | PRN

## 2022-05-30 MED ORDER — MORPHINE SULFATE (PF) 2 MG/ML IV SOLN
0.5000 mg | Freq: Once | INTRAVENOUS | Status: AC
  Administered 2022-05-30: 0.5 mg via INTRAVENOUS
  Filled 2022-05-30: qty 1

## 2022-05-30 MED ORDER — HALOPERIDOL LACTATE 2 MG/ML PO CONC: 0.5000 mg | ORAL | Status: DC | PRN

## 2022-05-30 MED ORDER — HALOPERIDOL LACTATE 5 MG/ML IJ SOLN: 0.5000 mg | INTRAMUSCULAR | Status: DC | PRN

## 2022-05-30 MED ORDER — LORAZEPAM 2 MG/ML IJ SOLN: 1.0000 mg | INTRAMUSCULAR | Status: DC | PRN

## 2022-06-04 ENCOUNTER — Ambulatory Visit: Payer: Medicare HMO | Admitting: Family Medicine

## 2022-06-05 ENCOUNTER — Inpatient Hospital Stay: Payer: Medicare HMO | Admitting: Family

## 2022-06-05 ENCOUNTER — Inpatient Hospital Stay: Payer: Medicare HMO

## 2022-06-07 NOTE — Discharge Summary (Signed)
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  He has had multiple prolonged hospitalizations since October 2023.  After last hospitalization, he was discharged to rehab and had mild improvement over approximately 2 weeks but then developed shortness of breath and was sent back to the hospital.  He was found to be positive for COVID-19. He has had gradual but progressive decline in overall since initial hospitalizations. He has been followed by palliative care as well.  Due to poor functional status and failure to thrive, GOC discussions were initiated and carried on throughout hospitalization as well. Decision by family was made to transition patient to comfort care with inpatient hospice due to progressive decline. He was placed on a Dilaudid infusion for comfort with improvement in his persistent dyspnea and tachypnea.  He had ongoing expected decline and passed naturally on 07-Jun-2022 at 2010-09-25.  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    Significant Diagnostic Studies: DG CHEST PORT 1 VIEW  Result Date: 06/02/2022 CLINICAL DATA:  Shortness of breath. EXAM: PORTABLE CHEST 1 VIEW COMPARISON:  Radiograph 05/20/2022. FINDINGS: Prior median  sternotomy and mitral valve repair. Stable heart size and mediastinal contours with cardiomegaly. Worsening bilateral pleural effusions. Worsening heterogeneous bilateral lung opacities. No pneumothorax. IMPRESSION: 1. Worsening bilateral pleural effusions. Worsening heterogeneous bilateral lung opacities, which may represent multifocal pneumonia, pulmonary edema, or combination there of. 2. Stable cardiomegaly. Electronically Signed   By: Keith Rake M.D.   On: 05/28/2022 20:13   ECHO TEE  Result Date: 05/22/2022    TRANSESOPHOGEAL ECHO REPORT   Patient Name:   Daniel Sharp Date of Exam: 05/09/2022 Medical Rec #:  [id number]   Height:       73.0 in Accession #:    [id number]  Weight:       203.5 lb Date of Birth:  1932-08-28   BSA:          2.167 m Patient Age:    62 years    BP:           101/62 mmHg Patient Gender: M           HR:           81 bpm. Exam Location:  Inpatient Procedure: Transesophageal Echo, Cardiac Doppler, Color Doppler and 3D Echo Indications:    Bacteremia  History:        Patient has prior history of Echocardiogram examinations, most                 recent 05/18/2022. Mitral Valve Disease, Arrythmias:Atrial                 Fibrillation, Signs/Symptoms:Dyspnea; Risk Factors:Hypertension.                 Mitral Valve: prosthetic annuloplasty ring valve is  present in the mitral position. Procedure Date: 2005. COVID-19                 infection.                  Mitral Valve: prosthetic prosthetic annuloplasty ring valve is                 present in the mitral position. Procedure Date: 2006 [facility]).  Sonographer:    Clayton Lefort RDCS (AE) Referring Phys: Lonn Georgia PROCEDURE: After discussion of the risks and benefits of a TEE, an informed consent was obtained from the patient. The transesophogeal probe was passed without difficulty through the esophogus of the patient. Sedation performed by different physician. The patient was monitored while under deep sedation.  Anesthestetic sedation was provided intravenously by Anesthesiology: 83.'07mg'$  of Propofol. Image quality was good. The patient developed no complications during the procedure.  IMPRESSIONS  1. Left ventricular ejection fraction, by estimation, is 65 to 70%. The left ventricle has hyperdynamic function. The left ventricle has no regional wall motion abnormalities. Left ventricular diastolic function could not be evaluated.  2. Right ventricular systolic function is mildly reduced. The right ventricular size is mildly enlarged. There is severely elevated pulmonary artery systolic pressure. The estimated right ventricular systolic pressure is 78.2 mmHg.  3. The left atrial appendage appears to effectively surgically amputated. The MR jet washes the left atrial appendage stump with each beat. Left atrial size was severely dilated. No left atrial/left atrial appendage thrombus was detected.  4. Right atrial size was severely dilated.  5. There is a previously placed mitral annuloplasty ring (incomplete "C" ring, size and type unkown) that appears well seated. There is flail motion of the medial (P3) scallop of the posterior leaflet with visible ruptured chordae. By PISA the effective  regurgitant orfice area is 0.88 cm sq, the regurgitant volume is 108 ml, regurgitant fraction 65%. The mitral valve is myxomatous. Severe mitral valve regurgitation. No evidence of mitral stenosis. The mean mitral valve gradient is 3.6 mmHg with average  heart rate of 77 bpm. There is a prosthetic prosthetic annuloplasty ring present in the mitral position. Procedure Date: 2006 [facility]).  6. Tricuspid valve regurgitation is moderate.  7. The aortic valve is tricuspid. There is mild calcification of the aortic valve. There is mild thickening of the aortic valve. Aortic valve regurgitation is mild. Aortic valve sclerosis/calcification is present, without any evidence of aortic stenosis.  8. There is Moderate (Grade III) protruding plaque  involving the aortic arch and descending aorta. Conclusion(s)/Recommendation(s): The appearance is in keeping with progression of myxomatous valve disease, with a new flail P3 scallop of the mitral valve. There is no clear evidence of endocarditis (no ring dehiscence, annular abscess, vegetations or perforation), although endocarditis cannot be firmly excluded in this patient with prosthetic material. Anticipate lower than expected cardioembolic risk from atrial fibrillation in the setting of surgically amputated left atrial appendage and vigorous "washing" of the left atrium by the the high velocity mitral insufficiency jet. FINDINGS  Left Ventricle: Left ventricular ejection fraction, by estimation, is 65 to 70%. The left ventricle has hyperdynamic function. The left ventricle has no regional wall motion abnormalities. The left ventricular internal cavity size was normal in size. Abnormal (paradoxical) septal motion consistent with post-operative status. Left ventricular diastolic function could not be evaluated due to atrial fibrillation. Left ventricular diastolic function could not be evaluated. Right Ventricle: The right ventricular size is mildly enlarged. No  increase in right ventricular wall thickness. Right ventricular systolic function is mildly reduced. There is severely elevated pulmonary artery systolic pressure. The tricuspid regurgitant velocity is 3.75 m/s, and with an assumed right atrial pressure of 8 mmHg, the estimated right ventricular systolic pressure is 36.1 mmHg. Left Atrium: The left atrial appendage appears to effectively surgically amputated. The MR jet washes the left atrial appendage stump with each beat. Left atrial size was severely dilated. No left atrial/left atrial appendage thrombus was detected. Right Atrium: Right atrial size was severely dilated. Pericardium: There is no evidence of pericardial effusion. Mitral Valve: There is a previously placed mitral annuloplasty ring  (incomplete "C" ring, size and type unkown) that appears well seated. There is flail motion of the medial (P3) scallop of the posterior leaflet with visible ruptured chordae. By PISA the  effective regurgitant orfice area is 0.88 cm sq, the regurgitant volume is 108 ml, regurgitant fraction 65%. The mitral valve is myxomatous. Severe mitral valve regurgitation, with eccentric laterally directed jet. There is a prosthetic prosthetic annuloplasty ring present in the mitral position. Procedure Date: 2006 [facility]). No evidence of mitral valve stenosis. The mean mitral valve gradient is 3.6 mmHg with average heart rate of 77 bpm. Tricuspid Valve: The tricuspid valve is normal in structure. Tricuspid valve regurgitation is moderate. Aortic Valve: The aortic valve is tricuspid. There is mild calcification of the aortic valve. There is mild thickening of the aortic valve. Aortic valve regurgitation is mild. Aortic valve sclerosis/calcification is present, without any evidence of aortic stenosis. Pulmonic Valve: The pulmonic valve was grossly normal. Pulmonic valve regurgitation is not visualized. Aorta: The aortic root, ascending aorta, aortic arch and descending aorta are all structurally normal, with no evidence of dilitation or obstruction. There is moderate (Grade III) protruding plaque involving the aortic arch and descending aorta. Venous: A pattern of systolic flow reversal, suggestive of severe mitral regurgitation is recorded from the left upper pulmonary vein and the right upper pulmonary vein. IAS/Shunts: No atrial level shunt detected by color flow Doppler.  LEFT VENTRICLE PLAX 2D LVOT diam:     2.70 cm LV SV:         59 LV SV Index:   27 LVOT Area:     5.71 cm  AORTIC VALVE LVOT Vmax:   71.91 cm/s LVOT Vmean:  40.607 cm/s LVOT VTI:    0.103 m MITRAL VALVE                 TRICUSPID VALVE MV Area (PHT): 3.11 cm      TR Peak grad:   56.2 mmHg MV Mean grad:  3.6 mmHg      TR Vmax:        375.00 cm/s MV Decel  Time: 244 msec MR Peak grad:   67.8 mmHg    SHUNTS MR Vmax:        411.65 cm/s  Systemic VTI:  0.10 m MR Vmean:       329.2 cm/s   Systemic Diam: 2.70 cm MR PISA:        5.81 cm MR PISA Radius: 0.96 cm Mihai Croitoru MD Electronically signed by Sanda Klein MD Signature Date/Time: 06/03/2022/3:18:02 PM    Final    DG CHEST PORT 1 VIEW  Result Date: 06/02/2022 CLINICAL DATA:  COVID-19 positive EXAM: PORTABLE CHEST 1 VIEW COMPARISON:  05/17/2022 FINDINGS: Stable heart size status post sternotomy, CABG, and cardiac valve repair. Extensive bilateral airspace consolidations, right worse than left. Right lower lobe consolidation  may be slightly worse compared to the prior exam. Small right pleural effusion. No pneumothorax. IMPRESSION: Persistent extensive bilateral airspace consolidations. Right lower lobe consolidation may be slightly worse compared to the prior exam. Electronically Signed   By: Davina Poke D.O.   On: 05/31/2022 13:55   ECHOCARDIOGRAM LIMITED  Result Date: 05/18/2022    ECHOCARDIOGRAM LIMITED REPORT   Patient Name:   Daniel Sharp Date of Exam: 05/18/2022 Medical Rec #:  [id number]   Height:       73.0 in Accession #:    [id number]  Weight:       192.7 lb Date of Birth:  Jan 12, 1933   BSA:          2.118 m Patient Age:    62 years    BP:           115/53 mmHg Patient Gender: M           HR:           92 bpm. Exam Location:  Inpatient Procedure: Limited Echo, Cardiac Doppler and Color Doppler Indications:    Bacteremia  History:        Patient has prior history of Echocardiogram examinations, most                 recent 04/25/2022. Arrythmias:Atrial Fibrillation,                 Signs/Symptoms:Dyspnea; Risk Factors:Hypertension.                  Mitral Valve: prosthetic annuloplasty ring valve is present in                 the mitral position. Procedure Date: 2005.  Sonographer:    Eartha Inch Referring Phys: [id number] Mignon Pine  Sonographer Comments: Image acquisition challenging  due to patient body habitus. IMPRESSIONS  1. Limited Echo to evaluate for infective endocarditis  2. Mitral valve repair s/p annuloplasty ring in 2005  3. No evidence of vegetations on mitral valve or aortic valve. Consider TEE if clinical suspicion is high.  4. Left ventricular ejection fraction, by estimation, is 55 to 60%. The left ventricle has normal function.  5. The mitral valve is degenerative. Mild mitral regurgitation. No mitral stenosis.  6. The aortic valve is normal in structure. Aortic valve regurgitation is trivial. No aortic stenosis is present.  7. The inferior vena cava is dilated in size with <50% respiratory variability, suggesting right atrial pressure of 15 mmHg. Comparison(s): No significant change from prior study. FINDINGS  Left Ventricle: Left ventricular ejection fraction, by estimation, is 55 to 60%. The left ventricle has normal function. Mitral Valve: The mitral valve is degenerative in appearance. Mild to moderate mitral annular calcification. Mild mitral valve regurgitation. There is a prosthetic annuloplasty ring present in the mitral position. Procedure Date: 2005. No evidence of mitral valve stenosis. MV peak gradient, 12.8 mmHg. The mean mitral valve gradient is 5.0 mmHg. There is no evidence of mitral valve vegetation. Aortic Valve: The aortic valve is normal in structure. Aortic valve regurgitation is trivial. No aortic stenosis is present. There is no evidence of aortic valve vegetation. Venous: The inferior vena cava is dilated in size with less than 50% respiratory variability, suggesting right atrial pressure of 15 mmHg. Additional Comments: Spectral Doppler performed. Color Doppler performed.   LV Volumes (MOD) LV vol d, MOD A2C: 203.0 ml Diastology LV vol d, MOD A4C: 163.0 ml LV e' medial:  7.73 cm/s  LV vol s, MOD A2C: 110.0 ml LV e' lateral: 8.93 cm/s LV vol s, MOD A4C: 88.6 ml LV SV MOD A2C:     93.0 ml LV SV MOD A4C:     163.0 ml LV SV MOD BP:      79.7 ml IVC IVC diam:  2.80 cm MITRAL VALVE MV Peak grad: 12.8 mmHg MV Mean grad: 5.0 mmHg MV Vmax:      1.79 m/s MV Vmean:     106.0 cm/s MR Peak grad: 78.9 mmHg MR Mean grad: 53.0 mmHg MR Vmax:      444.00 cm/s MR Vmean:     346.0 cm/s Vishnu Priya Mallipeddi Electronically signed by Lorelee Cover Mallipeddi Signature Date/Time: 05/18/2022/1:37:14 PM    Final    DG CHEST PORT 1 VIEW  Result Date: 05/17/2022 CLINICAL DATA:  Productive cough.  COVID positive. EXAM: PORTABLE CHEST 1 VIEW COMPARISON:  Chest x-ray dated May 15, 2022. FINDINGS: Unchanged mild cardiomegaly status post CABG and mitral annulus repair. Consolidation in the right mid and lower lung is not significantly changed. Similar patchy airspace opacities in the left mid lung. Unchanged small bilateral pleural effusions. No pneumothorax. No acute osseous abnormality. IMPRESSION: 1. Unchanged multifocal pneumonia and small bilateral pleural effusions. Electronically Signed   By: Titus Dubin M.D.   On: 05/17/2022 09:35   DG Chest Portable 1 View  Result Date: 05/22/2022 CLINICAL DATA:  Recent diagnosis of COVID pneumonia 2 days ago. Cough. 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 Stable cardiomediastinal contours. Small right pleural effusion is suspected. Diffuse hazy opacities are noted throughout both lungs. More focal airspace consolidation within the right midlung and right lower lung is identified which appears increased compared with the exam from 04/17/2022. IMPRESSION: 1. Diffuse hazy opacities throughout both lungs compatible with more focal airspace opacification within the right midlung and right lower lung compatible with pneumonia. 2. Suspect small right pleural effusion. Electronically Signed   By: Kerby Moors M.D.   On: 05/09/2022 14:22    Microbiology: No results found for this or any previous visit (from the past 240 hour(s)).   Labs: Basic Metabolic  Panel: Recent Labs  Lab 05/25/22 0237 05/26/22 0303 05/27/22 0330 05/28/22 0319 05/29/22 0307 06/29/22 0313  NA 144 144 147* 143 148* 145  K 4.4 3.7 3.6 3.9 4.1 4.2  CL 103 105 106 103 106 105  CO2 37* 33* 33* 33* 37* 33*  GLUCOSE 91 85 92 99 98 94  BUN 52* 50* 46* 46* 53* 54*  CREATININE 1.66* 1.66* 1.45* 1.68* 1.82* 1.99*  CALCIUM 10.0 10.0 10.0 9.9 10.3 10.2  MG 2.3  --   --   --   --  2.3   Liver Function Tests: Recent Labs  Lab 05/25/22 0237 05/26/22 0303 05/27/22 0330 05/28/22 0319 05/29/22 0307  AST 14* 13* 13* 15 14*  ALT '14 11 10 12 12  '$ ALKPHOS 66 60 56 59 57  BILITOT 0.7 0.8 0.6 0.7 0.7  PROT 5.5* 5.6* 5.6* 5.5* 5.9*  ALBUMIN 2.6* 2.6* 2.8* 2.7* 2.7*   No results for input(s): "LIPASE", "AMYLASE" in the last 168 hours. Recent Labs  Lab 05/17/2022 1952  AMMONIA 42*   CBC: Recent Labs  Lab 05/26/22 0303 05/27/22 0330 05/27/22 2144 05/28/22 0319 05/29/22 0307 06/29/2022 0313  WBC 6.6 6.8  --  7.9 6.8 9.8  NEUTROABS  --   --   --   --   --  8.3*  HGB 7.1* 6.7* 8.0* 7.6* 7.2* 7.1*  HCT 25.0* 23.1* 27.7* 26.8* 25.3* 25.6*  MCV 103.3* 102.7*  --  103.5* 105.9* 106.7*  PLT 138* 133*  --  139* 131* 141*   Cardiac Enzymes: No results for input(s): "CKTOTAL", "CKMB", "CKMBINDEX", "TROPONINI" in the last 168 hours. D-Dimer No results for input(s): "DDIMER" in the last 72 hours. BNP: Invalid input(s): "POCBNP" CBG: No results for input(s): "GLUCAP" in the last 168 hours. Anemia work up No results for input(s): "VITAMINB12", "FOLATE", "FERRITIN", "TIBC", "IRON", "RETICCTPCT" in the last 72 hours. Urinalysis    Component Value Date/Time   COLORURINE YELLOW 05/23/2022 1553   APPEARANCEUR CLEAR 05/28/2022 1553   LABSPEC 1.013 05/14/2022 1553   PHURINE 5.0 06/03/2022 1553   GLUCOSEU NEGATIVE 05/14/2022 1553   HGBUR NEGATIVE 05/20/2022 1553   BILIRUBINUR NEGATIVE 06/04/2022 1553   KETONESUR NEGATIVE 06/02/2022 1553   PROTEINUR NEGATIVE 05/23/2022 1553    NITRITE NEGATIVE 06/04/2022 1553   LEUKOCYTESUR NEGATIVE 05/08/2022 1553   Sepsis Labs Recent Labs  Lab 05/27/22 0330 05/28/22 0319 05/29/22 0307 June 15, 2022 0313  WBC 6.8 7.9 6.8 9.8       SIGNED:  Dwyane Dee, MD  [facility] 05/31/2022, 3:43 PM

## 2022-06-07 NOTE — Progress Notes (Signed)
Progress Note    Parv Manthey   IFO:[id number]  DOB: 12-05-1932  DOA: 05/20/2022     15 PCP: [name], MD  Initial CC: [facility] Course: Mr. 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  He has had multiple prolonged hospitalizations since October 2023.  After last hospitalization, he was discharged to rehab and had mild improvement over approximately 2 weeks but then developed shortness of breath and was sent back to the hospital.  He was found to be positive for COVID-19. He has had gradual but progressive decline in overall since initial hospitalizations. He has been followed by palliative care as well.  Due to poor functional status and failure to thrive, GOC discussions were initiated and carried on throughout hospitalization as well.  Interval History:  Patient became more labored and agitated overnight. Pulled off bipap after Dirk left and only after morphine did he rest some.  He is still dyspneic and confused this morning.  Both Rosalio Macadamia were bedside this morning. We discussed that he is progressively declining and continues to have multi organ failure and will not survive this hospitalization. They were in agreement for starting a dilaudid drip and focusing purely on comfort. I discussed with palliative care as well.   Assessment and Plan:  Failure to thrive  Goals of care - patient has had significant decline since early October, unfortunately not unexpected given underlying diagnoses of rectal adenocarcinoma, tubular adenoma with high-grade dysplasia, chronic anemia, recurrent bacteremia, pneumonia, severe MR, and multiple prolonged hospitalizations -At this time, patient does not have the physical reserve to overcome such a significant burden he has sustained with multiple infections,  hospitalizations, and underlying malignancy; he is imminently approaching end-of-life.  Discussed with son that any further treatment is futile medical care at this time unfortunately.  Patient appears to be truly uncomfortable and with significant episodes of air hunger/dyspnea.  Nutritional intake has also declined and is playing a factor in prognosis as well  - recommendation from myself and palliative care is to pursue comfort care/hospice at this time; patient does not have capacity to decide disposition. -Discussed further bedside with Rosalio Macadamia on 06/04/2022; family is okay with transitioning to full comfort care at this time - Dilaudid drip ordered along with further comfort care measures - likely cannot go to residential hospice at this rate; I anticipate in-hospital death (okay to transfer to 6th floor)  Acute on chronic hypoxic hypercarbic respiratory failure CAP COVID-19 pneumonia  - s/p lasix - s/p remdesivir - s/p BIPAP; no further bipap; focus for dyspnea/air hunger to be achieved with opioids and further meds at this time    Acute metabolic encephalopathy -recent EKG reviewed, unremarkable QTc.  -Family reports improvement with trial of seroquel to maintain day/night cycle - etiology also likely largely due to his overall failure to thrive   Recurrent E faecalis bacteremia:  -Patient was admitted in October when he was diagnosed with rectal adenocarcinoma and double bacteremia shortly after colonoscopy.   -blood cultures growing Enterococcus fecalis again.   -ID consulted with recs to cont on Rocephin and ampicillin -Underwent TEE 11/17, reviewed. No vegetations, however there are concerns for interval significantly worsened MR. 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 Irene Limbo   Severe MR -Marked worsening since 8/23 noted on recent TEE -Pt's son had long discussion with Dr. Sallyanne Kuster regarding TEE results and that they understand pt is not surgical candidate -On 11/19, I had discussed case and findings with on-call CT surgeon, Dr. Kipp Brood. Per CT Surgery, given hx of prior mitral valve surgery, advanced age, hemodynamic instability, and medical frailty, patient is not a candidate for surgical intervention -Appreciate assistance by Palliative Care   BPH: -Discontinuing nonessential meds   History of paroxysmal atrial fibrillation:  -Patient not on any rate control medications or any anticoagulation.  - Discontinuing nonessential meds   Hyperkalemia: Resolved.   Acute on CKD stage IIIa - progressively worsening in setting of MSOF   Hypernatremia:  - comfort care focus now   Anemia of chronic disease IDA -Has been receiving iron supplementation PTA -Last iron level of 23 on 11/1. Repeat iron of 36 with iron sat of 25 - likely some slow chronic loss from rectal cancer as well - Hgb still downtrending; further transfusions not likely to help and may cause further volume overload/dyspnea. Renal function also poor and lasix may not help either - need to focus on comfort at this time   Acute thrombocytopenia:  -Likely secondary to acute illness.  Lovenox discontinued    Old records reviewed in assessment of this patient   DVT prophylaxis:    Code Status:   Code Status: DNR  Mobility Assessment (last 72 hours)     Mobility Assessment     Row Name 05/28/22 0745           Does patient have an order for bedrest or is patient medically unstable No - Continue assessment       What is the highest level of mobility based on the progressive mobility assessment? Level 2 (Chairfast) - Balance while sitting on edge of bed and cannot stand       Is the above level  different from baseline mobility prior to current illness? Yes - Recommend PT order                Barriers to discharge:  Disposition Plan:  Inpatient comfort care; in-hospital death anticipated Status is: Inpt  Objective: Blood pressure (!) 87/38, pulse 71, temperature (!) 97.4 F (36.3 C), temperature source Axillary, resp. rate (!) 23, height '6\' 1"'$  (1.854 m), weight 91.7 kg, SpO2 100 %.  Examination:  Physical Exam Constitutional:      Comments: Chronically ill-appearing elderly gentleman lying in bed appearing dyspneic and uncomfortable; gross confusion noted  HENT:     Head: Normocephalic and atraumatic.     Mouth/Throat:     Mouth: Mucous membranes are dry.  Eyes:     Extraocular Movements: Extraocular movements intact.  Cardiovascular:     Rate and Rhythm: Normal rate and regular rhythm.  Pulmonary:     Comments: Coarse breath sounds bilaterally Abdominal:     General: Bowel sounds are normal. There is no distension.     Palpations: Abdomen is soft.     Tenderness: There is abdominal tenderness.  Musculoskeletal:        General: Swelling present.     Cervical back: Normal range of motion.     Comments: edema throughout all extremities  Skin:    Comments: Scattered purpura  Neurological:  Comments: Moves all 4 extremities and follows some commands      Consultants:  Palliative care  Procedures:    Data Reviewed: Results for orders placed or performed during the hospital encounter of 05/23/2022 (from the past 24 hour(s))  CBC with Differential/Platelet     Status: Abnormal   Collection Time: Jun 13, 2022  3:13 AM  Result Value Ref Range   WBC 9.8 4.0 - 10.5 K/uL   RBC 2.40 (L) 4.22 - 5.81 MIL/uL   Hemoglobin 7.1 (L) 13.0 - 17.0 g/dL   HCT 25.6 (L) 39.0 - 52.0 %   MCV 106.7 (H) 80.0 - 100.0 fL   MCH 29.6 26.0 - 34.0 pg   MCHC 27.7 (L) 30.0 - 36.0 g/dL   RDW 17.6 (H) 11.5 - 15.5 %   Platelets 141 (L) 150 - 400 K/uL   nRBC 0.0 0.0 - 0.2 %   Neutrophils  Relative % 84 %   Neutro Abs 8.3 (H) 1.7 - 7.7 K/uL   Lymphocytes Relative 8 %   Lymphs Abs 0.8 0.7 - 4.0 K/uL   Monocytes Relative 6 %   Monocytes Absolute 0.6 0.1 - 1.0 K/uL   Eosinophils Relative 1 %   Eosinophils Absolute 0.1 0.0 - 0.5 K/uL   Basophils Relative 0 %   Basophils Absolute 0.0 0.0 - 0.1 K/uL   Immature Granulocytes 1 %   Abs Immature Granulocytes 0.05 0.00 - 0.07 K/uL  Magnesium     Status: None   Collection Time: 06/13/22  3:13 AM  Result Value Ref Range   Magnesium 2.3 1.7 - 2.4 mg/dL  Basic metabolic panel     Status: Abnormal   Collection Time: 06/13/2022  3:13 AM  Result Value Ref Range   Sodium 145 135 - 145 mmol/L   Potassium 4.2 3.5 - 5.1 mmol/L   Chloride 105 98 - 111 mmol/L   CO2 33 (H) 22 - 32 mmol/L   Glucose, Bld 94 70 - 99 mg/dL   BUN 54 (H) 8 - 23 mg/dL   Creatinine, Ser 1.99 (H) 0.61 - 1.24 mg/dL   Calcium 10.2 8.9 - 10.3 mg/dL   GFR, Estimated 32 (L) >60 mL/min   Anion gap 7 5 - 15    I have Reviewed nursing notes, Vitals, and Lab results since pt's last encounter. Pertinent lab results : see above I have ordered test including BMP, CBC, Mg I have reviewed the last note from staff over past 24 hours I have discussed pt's care plan and test results with nursing staff, case manager  Time spent: Greater than 50% of the 55 minute visit was spent in counseling/coordination of care for the patient as laid out in the A&P.    LOS: 15 days   Dwyane Dee, MD [facility] 06-13-2022, 1:42 PM

## 2022-06-07 NOTE — Progress Notes (Signed)
Pt woke at midnight, confused, stating he had to get to his sons, that they had been in a horrible accident.  Pt not redirectable.  Pt had taken off/apart his bipap mask into 3 pieces, and was biting the soft mask.  After this RN fixed the mask, pt was attempting to bite off the soft mitts and his oxygen/IV tubing.  While fixing pt's lines, pt attempted to bite this RN without success.  Pt was still upset.  NP notified - '2mg'$  IV haldol ordered and given per order.  Some success noted, but pt was still noted to have some intermittent confusion (he awoke thinking there was flames, but was easily redirectable) and pt's respirations were ranging 25-30.  0.'5mg'$  morphine ordered and given per order.  +effect.  Pt resting without s/s distress; rass 0 to /-1 and cpot 0

## 2022-06-07 NOTE — Progress Notes (Signed)
    OVERNIGHT PROGRESS REPORT  Notified by RN that patient has expired at 2014 on 2022-06-14. 2 RN verified.  Patient was comfort care.  Family was present during this time.   Raenette Rover, DNP, [place]

## 2022-06-07 NOTE — Progress Notes (Signed)
Daily Progress Note   Patient Name: Daniel Sharp       Date: 2022-06-16 DOB: Mar 12, 1933  Age: 88 y.o. MRN#: [id number] Attending Physician: Dwyane Dee, MD Primary Care Physician: [name], MD Admit Date: 05/18/2022 Length of Stay: 15 days  Reason for Consultation/Follow-up: Establishing goals of care  Subjective:   CC: Patient minimally interactive though confused.  Spoke with both sons at bedside.  Following up regarding complex medical decision making.  Subjective:  Discussed care with primary hospitalist Dr. Sabino Gasser this morning prior to seeing patient. Dr. Sabino Gasser already had the chance to meet with son.  Upon initial presentation to bedside, son Daniel Sharp had stepped outside so able to speak with patient's other son Daniel Sharp.  Bruce updated me about the conversation this morning and potential transition to comfort care later today.  While present in the room, this provider was able to assist with oral care for patient and to provide some water.  Patient voiced appreciation for assistance.  Patient able to answer some simple questions though this is intermittent and he remains confused.  Son noted they are planning to be with patient today to enjoy time with him.  During conversation, noted patient's increased work of breathing and noting signs of nonverbal pain.  Noted would follow-up again soon to further discuss care with son Daniel Sharp.  Presented to bedside again later in the morning.  Both sons present at bedside during this visit. Dirk able to update this provider about discussions had with primary hospitalist this morning and plan to allow visiting during the day today and patient will not be returning to BiPAP tonight.  Voiced hearing this and also concerned about patient's increased work of breathing and signs of nonverbal pain.  Sons acknowledge seeing this.  Discussed the medications to provide comfort are not primarily aimed at making the patient sleepy, just supporting appropriate  symptom management so that patient does not have to work so hard to breathe and can actually hopefully be more interactive and comfortable.  Son's acknowledged hearing this.  Sons agreeing  to start medications to assist with appropriate symptom management after discussing the signs of nonverbal pain and discomfort patient was showing while present in room.  Discussed with bedside RN.  Bedside RN able to confirm after visit sons agreeing with starting Dilaudid continuous infusion and medications to assist with appropriate symptom management and comfort focused care.  Review of Systems  Objective:   Vital Signs:  BP 127/65 (BP Location: Right Arm)   Pulse 68   Temp (!) 97.4 F (36.3 C) (Axillary)   Resp 15   Ht '6\' 1"'$  (1.854 m)   Wt 91.7 kg   SpO2 100%   BMI 26.67 kg/m   Physical Exam: General: Ill appearing, confused, mumbling words at times HENT: Dry mucous membranes Cardiovascular: Tachycardic at times, anasarca present in all extremities Respiratory:  increased work of breathing noted with an increased respiratory rate and accessory muscle use Abdomen: not distended Extremities: Warm to touch Skin: Multiple ecchymoses on upper extremities bilaterally Neuro: Pleasantly confused  Imaging:  I personally reviewed recent imaging.   Assessment & Plan:   Assessment: 88 year old gentleman with hypertension atrial fibrillation chronic hypercapnic respiratory failure uses BiPAP at night, history of mitral valve repair in the past, history of myasthenia gravis and several other comorbidities, recently diagnosed with cancer, admitted from rehab facility with COVID-pneumonia.  Remains admitted to hospital medicine service for acute on chronic hypoxic and hypercarbic respiratory failure secondary to COVID-pneumonia, also  has superimposed bacterial pneumonia, acute on chronic diastolic congestive heart failure, patient underwent TEE and is found to have a mitral valve with severe eccentric  mitral regurgitation and ruptured chordae with no evidence of vegetation.  Has Enterococcus in his bloodstream and is on IV antibiotics with infectious disease specialist following. Remains admitted to hospital medicine service and a palliative consultation for ongoing goals of care discussions has been requested.  Recommendations/Plan: # Complex medical decision making/goals of care:  - Patient unable to participate in complex medical decision making due to his medical status.  -Both hospitalist and this provider spoke with patient's sons this morning.  Planning to transition to comfort focused care at this time. Will discontinue interventions that are no longer focused on comfort such as IV fluids, imaging, or lab work.  Will instead focus on symptom management of pain, dyspnea, and agitation in the setting of end-of-life care.  -Initiating comfort focused care at this time.  Based on patient's stability after initiation of comfort focused measures, may need to consider inpatient hospice though patient would need to appear stable for transfer.  - Code Status: DNR  Prognosis: days to weeks  # Symptom management    -Pain/Dyspnea, acute in the setting of end-of-life care                Patient showing nonverbal signs of pain such as grimacing and furrowing brows.  Patient's respiratory rate also greatly increased.                               -Discussed with primary hospitalist.  Starting Dilaudid IV continuous basal infusion at 0.2 mg/h.  Patient has RN bolus available of 0.2 mg every 15 minutes as needed.  Continue to adjust based on patient's symptom burden.                  -Anxiety/agitation, in the setting of end-of-life care                               -Starting IV Ativan '1mg'$  every 4 hours as needed. Continue to adjust based on patient's symptom burden.                                 -And also has IV Haldol 0.5 mg every 4 hours as needed. Continue to adjust based on patient's symptom  burden.                   -Secretions, in the setting of end-of-life care                               -Starting IV glycopyrrolate 0.2 mg every 4 hours as needed.  # Psychosocial Support:  -Son: Daniel Sharp reported he is HCPOA; patient has another son, Daniel Sharp, who will be coming to visit tomorrow.  # Discharge Planning: Shooting comfort focused care at this time.  Patient will either die in the hospital or if stabilizes may transfer to inpatient hospice facility if accepted.  Discussed with: Bedside RN, family, hospitalist  Thank you for allowing the palliative care team to participate in the care Daniel Sharp.  Chelsea Aus, DO Palliative Care Provider PMT # [phone]

## 2022-06-07 DEATH — deceased

## 2022-06-10 ENCOUNTER — Ambulatory Visit: Payer: Medicare HMO | Admitting: Interventional Cardiology

## 2022-07-12 ENCOUNTER — Ambulatory Visit: Payer: Medicare HMO | Admitting: Neurology

## 2022-07-19 IMAGING — MR MR ABDOMEN WO/W CM
17 series · 47 of 48 positions shown · IV contrast (gadavist)
Comparison: No prior abdominal MRI. CT the chest, abdomen and
pelvis 07/05/2021.

CLINICAL DATA: 88-year-old male with history of renal mass noted on
prior CT examination.

EXAM:
MRI ABDOMEN WITHOUT AND WITH CONTRAST
TECHNIQUE: Multiplanar multisequence MR imaging of the abdomen was performed
both before and after the administration of intravenous contrast.
CONTRAST:  10mL GADAVIST GADOBUTROL 1 MMOL/ML IV SOLN

[Series 5: T2 · coronal · 6.5mm · 1.56mm/px · 2 of 32 slices shown (1 of 2)]
[im 1/32]
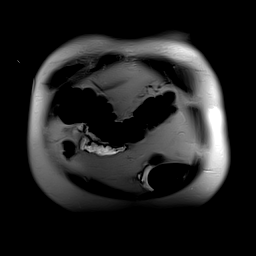
[im 32/32]
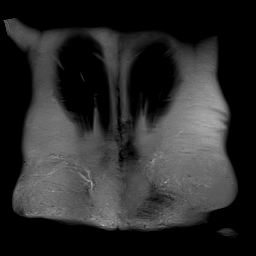

[Series 7: T2 fat-sat · axial · 6.0mm · 1.25mm/px · z∈[+186,+438]mm · 3 of 36 slices shown]
[im 1/36]
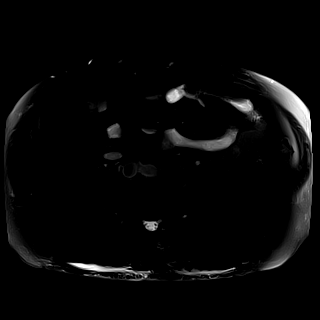
[im 18/36]
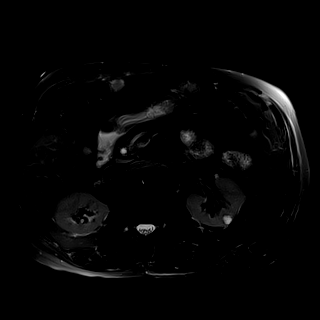
[im 36/36]
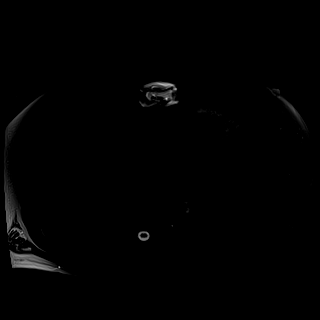

[Series 8: T1 · axial · 3.0mm · 1.25mm/px · z∈[+185,+398]mm · 4 of 72 slices shown (1 of 2)]
[im 1/72]
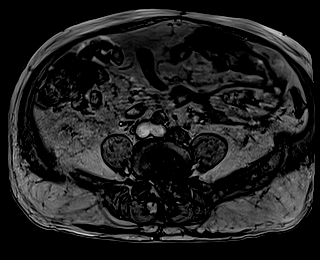
[im 24/72]
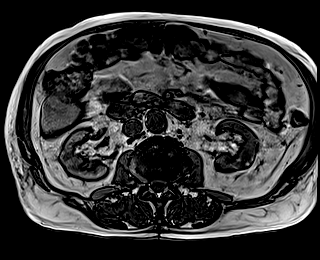
[im 48/72]
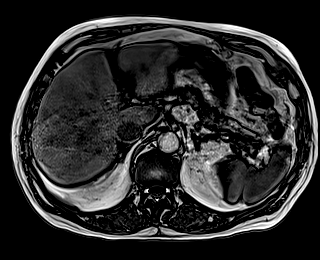
[im 72/72]
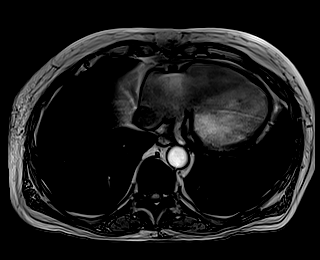

[Series 9: T1 · axial · 3.0mm · 1.25mm/px · z∈[+185,+398]mm · 4 of 72 slices shown (2 of 2)]
[im 1/72]
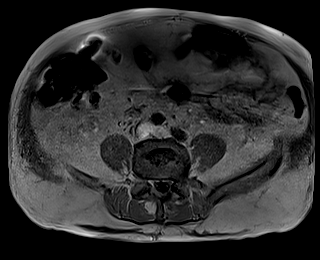
[im 24/72]
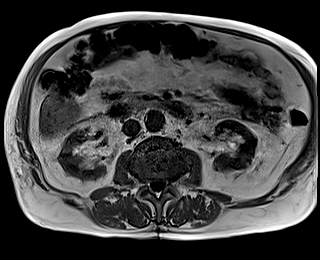
[im 48/72]
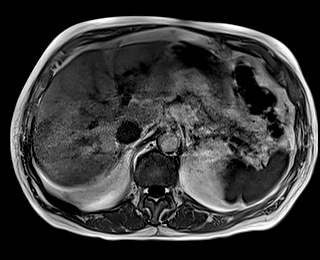
[im 72/72]
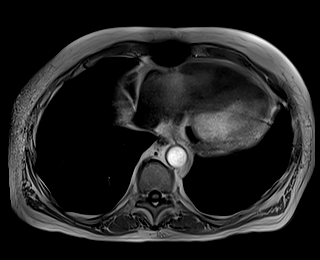

[Series 11: DWI · axial · 6.0mm · 1.49mm/px · z∈[+185,+437]mm · 2 of 36 slices shown]
[im 1/36]
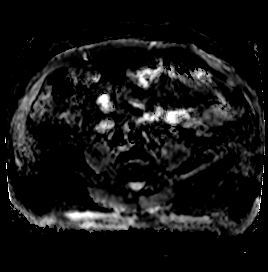
[im 36/36]
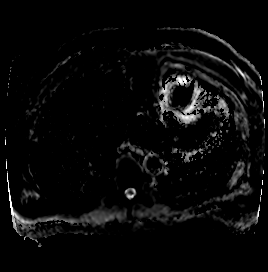

[Series 12: bSSFP · axial · 5.0mm · 0.84mm/px · z∈[+184,+399]mm · 2 of 40 slices shown]
[im 1/40]
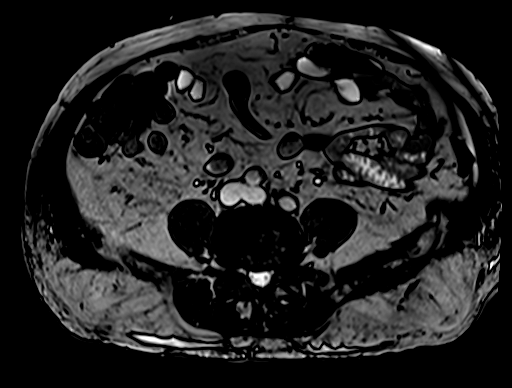
[im 40/40]
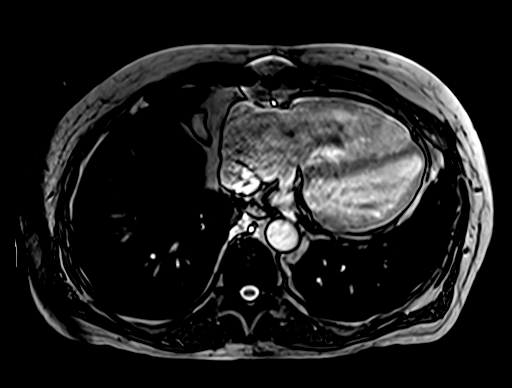

[Series 13: T2 · axial · 6.5mm · 1.56mm/px · 1 of 30 slices shown (2 of 2)]
[im 1/30]
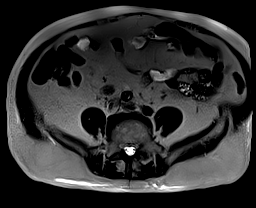

[Series 15: T1 dynamic · axial · 3.0mm · 1.25mm/px · z∈[+173,+410]mm · 3 of 80 slices shown (1 of 10)]
[im 1/80]
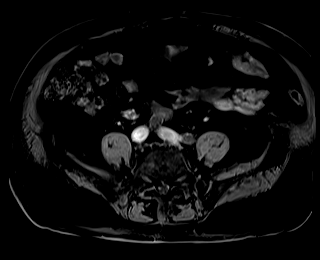
[im 40/80]
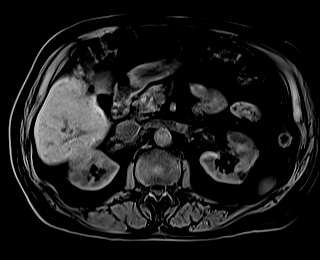
[im 80/80]
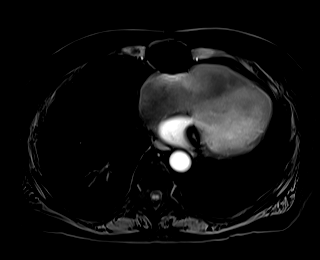

[Series 18: T1 dynamic · axial · 3.0mm · 1.25mm/px · z∈[+173,+410]mm · 3 of 80 slices shown (2 of 10)]
[im 1/80]
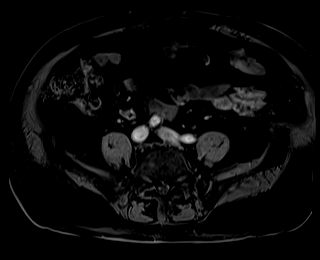
[im 40/80]
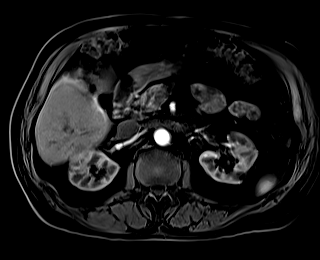
[im 80/80]
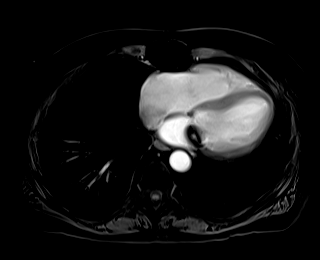

[Series 19: T1 dynamic · axial · 3.0mm · 1.25mm/px · z∈[+173,+410]mm · 3 of 80 slices shown (3 of 10)]
[im 1/80]
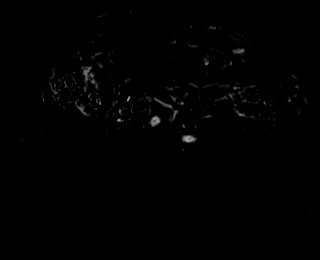
[im 40/80]
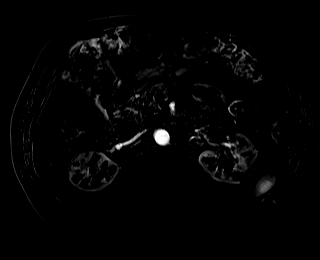
[im 80/80]
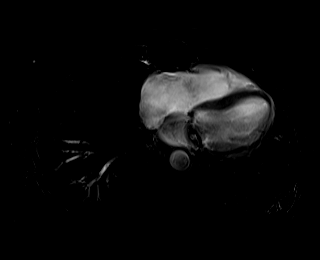

[Series 22: T1 dynamic · axial · 3.0mm · 1.25mm/px · z∈[+173,+410]mm · 3 of 80 slices shown (4 of 10)]
[im 1/80]
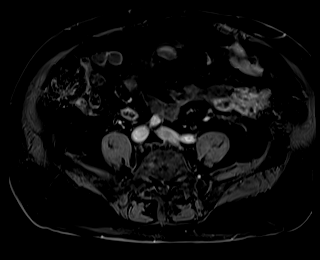
[im 40/80]
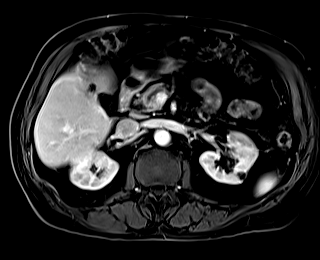
[im 80/80]
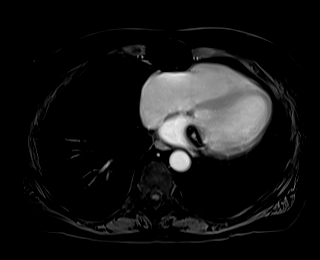

[Series 23: T1 dynamic · axial · 3.0mm · 1.25mm/px · z∈[+173,+410]mm · 3 of 80 slices shown (5 of 10)]
[im 1/80]
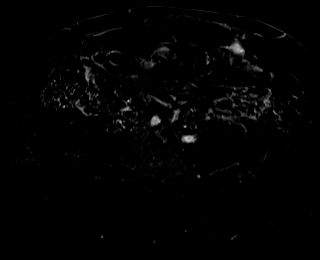
[im 40/80]
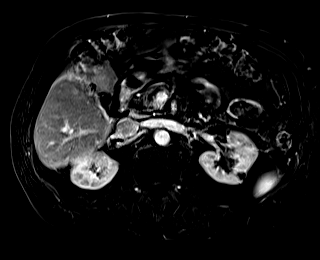
[im 80/80]
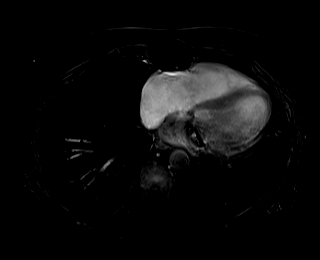

[Series 26: T1 dynamic · axial · 3.0mm · 1.25mm/px · z∈[+173,+410]mm · 3 of 80 slices shown (6 of 10)]
[im 1/80]
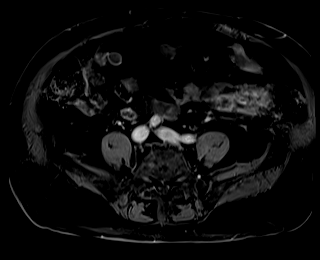
[im 40/80]
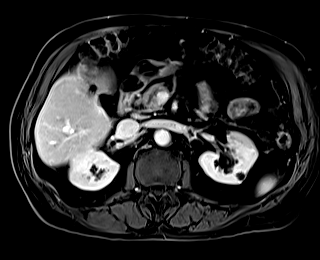
[im 80/80]
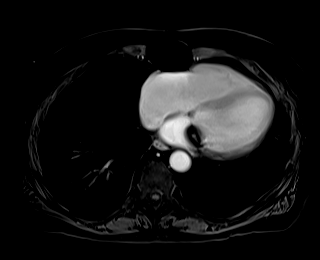

[Series 27: T1 dynamic · axial · 3.0mm · 1.25mm/px · z∈[+173,+410]mm · 3 of 80 slices shown (7 of 10)]
[im 1/80]
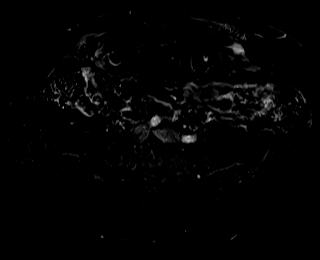
[im 40/80]
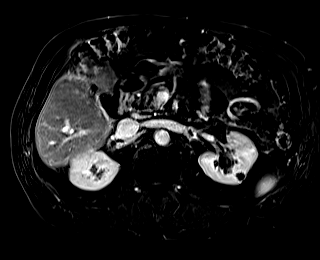
[im 80/80]
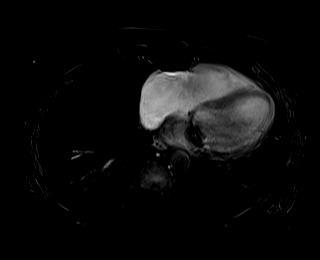

[Series 29: T1 dynamic · coronal · 3.5mm · 1.41mm/px · 3 of 72 slices shown (8 of 10)]
[im 1/72]
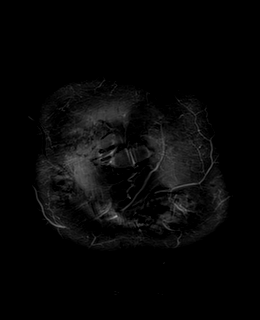
[im 36/72]
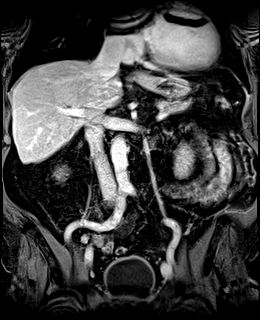
[im 72/72]
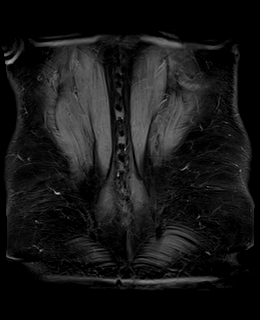

[Series 32: T1 dynamic · axial · 3.0mm · 1.25mm/px · z∈[+173,+410]mm · 3 of 80 slices shown (9 of 10)]
[im 1/80]
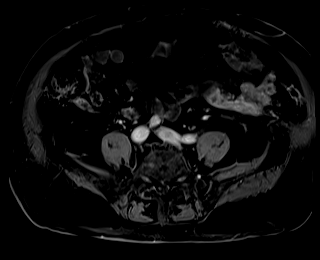
[im 40/80]
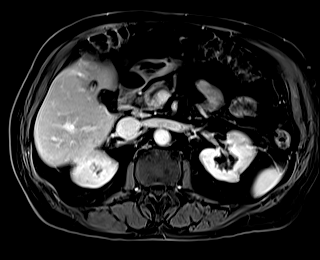
[im 80/80]
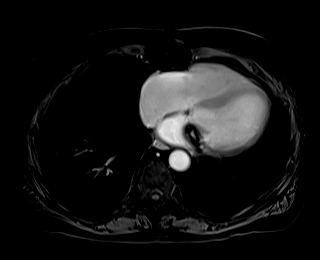

[Series 33: T1 dynamic · axial · 3.0mm · 1.25mm/px · z∈[+173,+290]mm · 2 of 80 slices shown (10 of 10)]
[im 1/80]
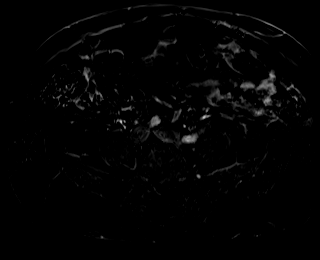
[im 40/80]
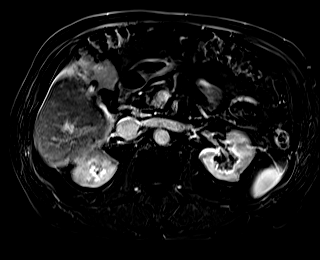

[47 of 48 positions shown; findings below may reference images not displayed]

FINDINGS: Lower chest: Unremarkable.

Hepatobiliary: Mild diffuse loss of signal intensity throughout the
hepatic parenchyma on out of phase dual echo images, indicative of a
background of hepatic steatosis. Subcentimeter T1 hypointense, T2
hyperintense, nonenhancing lesions in the left lobe of the liver,
compatible with tiny simple cysts or biliary hamartomas. No
aggressive appearing hepatic lesions. Status post cholecystectomy.
No intra or extrahepatic biliary ductal dilatation. Notably,
however, in the distal common bile duct there are 2 small filling
defects measuring up to 4 mm (coronal image 13 of series 5),
compatible with retained choledocholithiasis. Common bile duct
measures 6 mm in the porta hepatis.

Pancreas: No pancreatic mass. Mild diffuse pancreatic ductal
dilatation which measures up to 5 mm in the body of the pancreas. No
peripancreatic fluid collections or inflammatory changes.

Spleen:  Unremarkable.

Adrenals/Urinary Tract: The lesion of concern is partially exophytic
extending off the lower pole of the left kidney (axial image 62 of
series 32 and coronal image 49 of series 29) measuring 4.0 x 3.5 x
3.8 cm and is heterogeneous in signal intensity on T1 and T2
weighted images, but generally T1 hypointense and T2 hyperintense,
with heterogeneous areas of enhancement on post gadolinium images,
indicative of a solid renal neoplasm. This is encapsulated within
Gerota's fascia and is well separated from the left renal vein which
is widely patent at this time. Other subcentimeter T1 hypointense,
T2 hyperintense, nonenhancing lesions in the left kidney are
compatible with tiny simple cysts. Right kidney and bilateral
adrenal glands are normal in appearance. No hydroureteronephrosis in
the visualized portions of the abdomen.

Stomach/Bowel: Visualized portions are unremarkable.

Vascular/Lymphatic: Aortic atherosclerosis. No aneurysm identified
in the visualized abdominal vasculature. No lymphadenopathy noted in
the abdomen.

Other: No significant volume of ascites noted in the visualized
portions of the peritoneal cavity.

Musculoskeletal: No aggressive appearing osseous lesions are noted
in the visualized portions of the skeleton.
IMPRESSION: 1. 4.0 x 3.5 x 3.8 cm enhancing neoplasm in the lower pole of the
left kidney highly concerning for primary renal cell carcinoma. At
this time, this is encapsulated within Gerota's fascia, is well
separated from the left renal vein which is widely patent, and is
not associated with definite lymphadenopathy or signs of metastatic
disease in the abdomen.
2. Mild dilatation of the main pancreatic duct. No obstructing
lesion identified in the head of the pancreas. However, there are 2
retained ductal stones in the common bile duct in this patient
status post cholecystectomy. At this time, our these are not
associated with proximal biliary tract dilatation, but the presence
of the stones in the distal common bile duct could be exerting some
mass effect in the head of the pancreas causing some associated
pancreatic ductal dilatation. Further clinical evaluation is
recommended.
3. Hepatic steatosis.
